# Patient Record
Sex: Male | Born: 1940 | Race: White | Hispanic: No | Marital: Married | Smoking: Never smoker
Health system: Southern US, Community
[De-identification: ages and names within clinical notes are randomized; demographics above are authoritative.]

## PROBLEM LIST (undated history)

## (undated) DIAGNOSIS — I10 Essential (primary) hypertension: Secondary | ICD-10-CM

## (undated) DIAGNOSIS — C3490 Malignant neoplasm of unspecified part of unspecified bronchus or lung: Secondary | ICD-10-CM

## (undated) DIAGNOSIS — G43909 Migraine, unspecified, not intractable, without status migrainosus: Secondary | ICD-10-CM

## (undated) DIAGNOSIS — Z860100 Personal history of colon polyps, unspecified: Secondary | ICD-10-CM

## (undated) DIAGNOSIS — E119 Type 2 diabetes mellitus without complications: Secondary | ICD-10-CM

## (undated) DIAGNOSIS — S065X9A Traumatic subdural hemorrhage with loss of consciousness of unspecified duration, initial encounter: Secondary | ICD-10-CM

## (undated) DIAGNOSIS — H409 Unspecified glaucoma: Secondary | ICD-10-CM

## (undated) DIAGNOSIS — E785 Hyperlipidemia, unspecified: Secondary | ICD-10-CM

## (undated) DIAGNOSIS — N200 Calculus of kidney: Secondary | ICD-10-CM

## (undated) DIAGNOSIS — M1990 Unspecified osteoarthritis, unspecified site: Secondary | ICD-10-CM

## (undated) DIAGNOSIS — C801 Malignant (primary) neoplasm, unspecified: Secondary | ICD-10-CM

## (undated) DIAGNOSIS — Z87442 Personal history of urinary calculi: Secondary | ICD-10-CM

## (undated) DIAGNOSIS — B029 Zoster without complications: Secondary | ICD-10-CM

## (undated) HISTORY — DX: Personal history of colon polyps, unspecified: Z86.0100

## (undated) HISTORY — DX: Essential (primary) hypertension: I10

## (undated) HISTORY — DX: Unspecified glaucoma: H40.9

## (undated) HISTORY — DX: Type 2 diabetes mellitus without complications: E11.9

## (undated) HISTORY — DX: Migraine, unspecified, not intractable, without status migrainosus: G43.909

## (undated) HISTORY — DX: Zoster without complications: B02.9

## (undated) HISTORY — DX: Hyperlipidemia, unspecified: E78.5

## (undated) HISTORY — PX: OTHER SURGICAL HISTORY: SHX169

## (undated) HISTORY — DX: Calculus of kidney: N20.0

## (undated) HISTORY — DX: Personal history of colonic polyps: Z86.010

## (undated) HISTORY — DX: Traumatic subdural hemorrhage with loss of consciousness of unspecified duration, initial encounter: S06.5X9A

## (undated) HISTORY — PX: APPENDECTOMY: SHX54

---

## 2000-10-03 ENCOUNTER — Ambulatory Visit (HOSPITAL_COMMUNITY): Admission: RE | Admit: 2000-10-03 | Discharge: 2000-10-03 | Payer: Self-pay | Admitting: Gastroenterology

## 2004-05-09 ENCOUNTER — Ambulatory Visit: Payer: Self-pay | Admitting: Family Medicine

## 2004-08-22 ENCOUNTER — Ambulatory Visit: Payer: Self-pay | Admitting: Family Medicine

## 2005-02-15 ENCOUNTER — Ambulatory Visit: Payer: Self-pay | Admitting: Family Medicine

## 2006-03-12 ENCOUNTER — Ambulatory Visit: Payer: Self-pay | Admitting: Family Medicine

## 2006-04-30 ENCOUNTER — Ambulatory Visit: Payer: Self-pay | Admitting: Family Medicine

## 2007-04-13 ENCOUNTER — Telehealth: Payer: Self-pay | Admitting: Family Medicine

## 2007-04-15 DIAGNOSIS — Z860100 Personal history of colon polyps, unspecified: Secondary | ICD-10-CM | POA: Insufficient documentation

## 2007-04-15 DIAGNOSIS — Z87442 Personal history of urinary calculi: Secondary | ICD-10-CM | POA: Insufficient documentation

## 2007-04-15 DIAGNOSIS — I10 Essential (primary) hypertension: Secondary | ICD-10-CM | POA: Insufficient documentation

## 2007-04-20 ENCOUNTER — Ambulatory Visit: Payer: Self-pay | Admitting: Family Medicine

## 2007-04-20 DIAGNOSIS — E785 Hyperlipidemia, unspecified: Secondary | ICD-10-CM | POA: Insufficient documentation

## 2007-04-22 LAB — CONVERTED CEMR LAB
Albumin: 4.4 g/dL (ref 3.5–5.2)
Alkaline Phosphatase: 89 units/L (ref 39–117)
BUN: 13 mg/dL (ref 6–23)
Basophils Absolute: 0 10*3/uL (ref 0.0–0.1)
Cholesterol: 156 mg/dL (ref 0–200)
Creatinine, Ser: 0.9 mg/dL (ref 0.4–1.5)
Eosinophils Absolute: 0.2 10*3/uL (ref 0.0–0.6)
GFR calc Af Amer: 109 mL/min
GFR calc non Af Amer: 90 mL/min
HDL: 49.1 mg/dL (ref >39.0)
Hemoglobin: 16.2 g/dL (ref 13.0–17.0)
LDL Cholesterol: 96 mg/dL (ref 0–99)
MCHC: 35.2 g/dL (ref 30.0–36.0)
Monocytes Absolute: 0.6 10*3/uL (ref 0.2–0.7)
Monocytes Relative: 6.4 % (ref 3.0–11.0)
Neutro Abs: 6 10*3/uL (ref 1.4–7.7)
Potassium: 4.7 meq/L (ref 3.5–5.1)
RDW: 12.7 % (ref 11.5–14.6)
TSH: 1.3 microintl units/mL (ref 0.35–5.50)
Vit D, 1,25-Dihydroxy: 30 (ref 30–89)

## 2007-06-22 ENCOUNTER — Ambulatory Visit: Payer: Self-pay | Admitting: Family Medicine

## 2007-06-22 ENCOUNTER — Telehealth: Payer: Self-pay | Admitting: Family Medicine

## 2007-06-22 DIAGNOSIS — E559 Vitamin D deficiency, unspecified: Secondary | ICD-10-CM | POA: Insufficient documentation

## 2007-06-22 DIAGNOSIS — E538 Deficiency of other specified B group vitamins: Secondary | ICD-10-CM | POA: Insufficient documentation

## 2007-06-26 LAB — CONVERTED CEMR LAB: Vit D, 1,25-Dihydroxy: 44 (ref 30–89)

## 2008-03-18 ENCOUNTER — Ambulatory Visit: Payer: Self-pay | Admitting: Family Medicine

## 2008-05-09 ENCOUNTER — Ambulatory Visit: Payer: Self-pay | Admitting: Family Medicine

## 2008-05-09 DIAGNOSIS — N138 Other obstructive and reflux uropathy: Secondary | ICD-10-CM | POA: Insufficient documentation

## 2008-05-09 DIAGNOSIS — N401 Enlarged prostate with lower urinary tract symptoms: Secondary | ICD-10-CM

## 2008-05-09 LAB — CONVERTED CEMR LAB
Bilirubin Urine: NEGATIVE
Glucose, Urine, Semiquant: NEGATIVE
WBC Urine, dipstick: NEGATIVE
pH: 7

## 2008-05-10 LAB — CONVERTED CEMR LAB
ALT: 21 units/L (ref 0–53)
AST: 22 units/L (ref 0–37)
Albumin: 4.2 g/dL (ref 3.5–5.2)
BUN: 15 mg/dL (ref 6–23)
Basophils Absolute: 0 10*3/uL (ref 0.0–0.1)
Basophils Relative: 0.3 % (ref 0.0–3.0)
CO2: 31 meq/L (ref 19–32)
Calcium: 9.6 mg/dL (ref 8.4–10.5)
Chloride: 106 meq/L (ref 96–112)
Cholesterol: 178 mg/dL (ref 0–200)
Creatinine, Ser: 0.9 mg/dL (ref 0.4–1.5)
Eosinophils Absolute: 0.2 10*3/uL (ref 0.0–0.7)
Eosinophils Relative: 2.1 % (ref 0.0–5.0)
Hemoglobin: 16.3 g/dL (ref 13.0–17.0)
Lymphocytes Relative: 26.5 % (ref 12.0–46.0)
MCHC: 34.2 g/dL (ref 30.0–36.0)
MCV: 91.9 fL (ref 78.0–100.0)
Neutro Abs: 5.5 10*3/uL (ref 1.4–7.7)
PSA: 0.96 ng/mL (ref 0.10–4.00)
RBC: 5.19 M/uL (ref 4.22–5.81)
TSH: 1.35 microintl units/mL (ref 0.35–5.50)
Total Bilirubin: 1 mg/dL (ref 0.3–1.2)
Total Protein: 7 g/dL (ref 6.0–8.3)
WBC: 8.5 10*3/uL (ref 4.5–10.5)

## 2008-10-25 ENCOUNTER — Encounter (INDEPENDENT_AMBULATORY_CARE_PROVIDER_SITE_OTHER): Payer: Self-pay | Admitting: *Deleted

## 2008-11-03 HISTORY — PX: OTHER SURGICAL HISTORY: SHX169

## 2008-11-13 ENCOUNTER — Inpatient Hospital Stay (HOSPITAL_COMMUNITY): Admission: EM | Admit: 2008-11-13 | Discharge: 2008-11-17 | Payer: Self-pay | Admitting: Emergency Medicine

## 2008-11-13 DIAGNOSIS — S065XAA Traumatic subdural hemorrhage with loss of consciousness status unknown, initial encounter: Secondary | ICD-10-CM

## 2008-11-13 DIAGNOSIS — S065X9A Traumatic subdural hemorrhage with loss of consciousness of unspecified duration, initial encounter: Secondary | ICD-10-CM

## 2008-11-13 HISTORY — DX: Traumatic subdural hemorrhage with loss of consciousness of unspecified duration, initial encounter: S06.5X9A

## 2008-11-13 HISTORY — DX: Traumatic subdural hemorrhage with loss of consciousness status unknown, initial encounter: S06.5XAA

## 2008-11-14 ENCOUNTER — Encounter (INDEPENDENT_AMBULATORY_CARE_PROVIDER_SITE_OTHER): Payer: Self-pay | Admitting: *Deleted

## 2008-11-17 ENCOUNTER — Encounter (INDEPENDENT_AMBULATORY_CARE_PROVIDER_SITE_OTHER): Payer: Self-pay | Admitting: *Deleted

## 2008-11-19 ENCOUNTER — Inpatient Hospital Stay (HOSPITAL_COMMUNITY): Admission: EM | Admit: 2008-11-19 | Discharge: 2008-11-23 | Payer: Self-pay | Admitting: Emergency Medicine

## 2008-11-21 ENCOUNTER — Encounter (INDEPENDENT_AMBULATORY_CARE_PROVIDER_SITE_OTHER): Payer: Self-pay | Admitting: Neurology

## 2008-11-21 ENCOUNTER — Encounter (INDEPENDENT_AMBULATORY_CARE_PROVIDER_SITE_OTHER): Payer: Self-pay | Admitting: Neurological Surgery

## 2008-11-23 ENCOUNTER — Encounter (INDEPENDENT_AMBULATORY_CARE_PROVIDER_SITE_OTHER): Payer: Self-pay | Admitting: *Deleted

## 2008-11-29 ENCOUNTER — Encounter: Admission: RE | Admit: 2008-11-29 | Discharge: 2008-11-29 | Payer: Self-pay | Admitting: Neurological Surgery

## 2008-12-02 ENCOUNTER — Encounter: Admission: RE | Admit: 2008-12-02 | Discharge: 2008-12-02 | Payer: Self-pay | Admitting: Anesthesiology

## 2008-12-02 ENCOUNTER — Encounter (INDEPENDENT_AMBULATORY_CARE_PROVIDER_SITE_OTHER): Payer: Self-pay | Admitting: *Deleted

## 2008-12-15 ENCOUNTER — Encounter: Admission: RE | Admit: 2008-12-15 | Discharge: 2009-03-14 | Payer: Self-pay | Admitting: Neurological Surgery

## 2009-01-03 ENCOUNTER — Encounter: Admission: RE | Admit: 2009-01-03 | Discharge: 2009-01-03 | Payer: Self-pay | Admitting: Neurological Surgery

## 2009-02-13 ENCOUNTER — Encounter: Admission: RE | Admit: 2009-02-13 | Discharge: 2009-02-13 | Payer: Self-pay | Admitting: Neurological Surgery

## 2009-04-17 ENCOUNTER — Encounter: Admission: RE | Admit: 2009-04-17 | Discharge: 2009-04-17 | Payer: Self-pay | Admitting: Anesthesiology

## 2009-05-01 ENCOUNTER — Ambulatory Visit: Payer: Self-pay | Admitting: Family Medicine

## 2009-05-01 DIAGNOSIS — B029 Zoster without complications: Secondary | ICD-10-CM | POA: Insufficient documentation

## 2009-05-30 ENCOUNTER — Ambulatory Visit: Payer: Self-pay | Admitting: Family Medicine

## 2009-06-08 ENCOUNTER — Telehealth: Payer: Self-pay | Admitting: Family Medicine

## 2009-06-08 LAB — CONVERTED CEMR LAB
Albumin: 4.2 g/dL (ref 3.5–5.2)
Alkaline Phosphatase: 90 units/L (ref 39–117)
Basophils Absolute: 0 10*3/uL (ref 0.0–0.1)
Basophils Relative: 0.2 % (ref 0.0–3.0)
CO2: 28 meq/L (ref 19–32)
Chloride: 107 meq/L (ref 96–112)
Eosinophils Absolute: 0.1 10*3/uL (ref 0.0–0.7)
Glucose, Bld: 145 mg/dL — ABNORMAL HIGH (ref 70–99)
HCT: 45.1 % (ref 39.0–52.0)
HDL: 57.9 mg/dL (ref >39.00)
Hemoglobin: 15.3 g/dL (ref 13.0–17.0)
Lymphocytes Relative: 31.1 % (ref 12.0–46.0)
Lymphs Abs: 1.9 10*3/uL (ref 0.7–4.0)
MCHC: 34 g/dL (ref 30.0–36.0)
MCV: 91.9 fL (ref 78.0–100.0)
Neutro Abs: 3.6 10*3/uL (ref 1.4–7.7)
PSA: 1.59 ng/mL (ref 0.10–4.00)
Potassium: 3.7 meq/L (ref 3.5–5.1)
RBC: 4.91 M/uL (ref 4.22–5.81)
RDW: 13.6 % (ref 11.5–14.6)
Sodium: 142 meq/L (ref 135–145)
TSH: 1.38 microintl units/mL (ref 0.35–5.50)
Total CHOL/HDL Ratio: 3
Total Protein: 6.9 g/dL (ref 6.0–8.3)

## 2009-11-30 ENCOUNTER — Ambulatory Visit: Payer: Self-pay | Admitting: Family Medicine

## 2009-12-01 LAB — CONVERTED CEMR LAB: Blood Glucose, AC Bkfst: 138 mg/dL

## 2009-12-07 ENCOUNTER — Ambulatory Visit: Payer: Self-pay | Admitting: Family Medicine

## 2009-12-11 LAB — CONVERTED CEMR LAB
AST: 20 units/L (ref 0–37)
Bilirubin, Direct: 0.2 mg/dL (ref 0.0–0.3)
HDL: 52.5 mg/dL (ref >39.00)
Hgb A1c MFr Bld: 6.1 % (ref 4.6–6.5)
Microalb Creat Ratio: 0.4 mg/g (ref 0.0–30.0)
Microalb, Ur: 0.6 mg/dL (ref 0.0–1.9)
Total Bilirubin: 1 mg/dL (ref 0.3–1.2)
Total CHOL/HDL Ratio: 3
VLDL: 13 mg/dL (ref 0.0–40.0)

## 2009-12-25 ENCOUNTER — Telehealth: Payer: Self-pay | Admitting: Family Medicine

## 2010-03-09 ENCOUNTER — Ambulatory Visit: Payer: Self-pay | Admitting: Family Medicine

## 2010-04-17 DIAGNOSIS — B029 Zoster without complications: Secondary | ICD-10-CM

## 2010-04-17 HISTORY — DX: Zoster without complications: B02.9

## 2010-05-31 ENCOUNTER — Ambulatory Visit: Payer: Self-pay | Admitting: Family Medicine

## 2010-05-31 DIAGNOSIS — F4321 Adjustment disorder with depressed mood: Secondary | ICD-10-CM | POA: Insufficient documentation

## 2010-06-04 ENCOUNTER — Ambulatory Visit: Payer: Self-pay | Admitting: Family Medicine

## 2010-06-27 ENCOUNTER — Telehealth: Payer: Self-pay | Admitting: Family Medicine

## 2010-07-17 NOTE — Progress Notes (Signed)
Summary: Pt has concerns re: dosage of Simvastatin re: FDA  Phone Note Call from Patient Call back at Home Phone [phone]   Caller: Patient Summary of Call: Pt called and said that the FDA said that pts can not take more than 20mg  of Simvastatin, if pt also taking Amlodipine. Pt says [facility], sent a fax to Dr. Clent Ridges re: this matter and have not gotten response. Pt is wondering what he should do. Please call asap.  Initial call taken by: Lucy Antigua,  December 25, 2009 9:49 AM  Follow-up for Phone Call        I agree that no one should be taking 80 mg of Simvastatin. Stop this and switch to Lipitor 40 mg once daily . Call in one year supply  Follow-up by: [name] MD,  December 25, 2009 2:46 PM  Additional Follow-up for Phone Call Additional follow up Details #1::        Phone Call Completed, Rx Called In Additional Follow-up by: Raechel Ache, RN,  December 25, 2009 2:48 PM    New/Updated Medications: LIPITOR 40 MG TABS (ATORVASTATIN CALCIUM) 1 once daily Prescriptions: LIPITOR 40 MG TABS (ATORVASTATIN CALCIUM) 1 once daily  #30 x 11   Entered by:   Raechel Ache, RN   Authorized by:   [name] MD   Signed by:   Raechel Ache, RN on 12/25/2009   Method used:   Electronically to        [id number] ([facility]       [address]       [place], [place]  [address]       Ph: [phone] or [phone]       Fax: [phone]   RxID:   [id number]

## 2010-07-17 NOTE — Assessment & Plan Note (Signed)
Summary: follow up on blood sugar/cjr   Vital Signs:  Patient profile:   70 year old male Weight:      175 pounds BP sitting:   124 / 68  (left arm) Cuff size:   regular  Vitals Entered By: Raechel Ache, RN (December 07, 2009 8:27 AM) CC: ROV to recheck labs.   History of Present Illness: Here to follow up on DM type 2 which was diagnosed last December as a part of his cpx. he has been watching his diet closely. he feels fine.   Allergies: No Known Drug Allergies  Past History:  Past Medical History: Migraines Colonic polyps, hx of Hypertension Nephrolithiasis, hx of Hyperlipidemia sees Dr. 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 Ewing Schlein, repeat in 5 yrs left craniotomy 11-13-08 per Dr. Marikay Alar for a subdural hematoma  Review of Systems  The patient denies anorexia, fever, weight loss, weight gain, vision loss, decreased hearing, hoarseness, chest pain, syncope, dyspnea on exertion, peripheral edema, prolonged cough, headaches, hemoptysis, abdominal pain, melena, hematochezia, severe indigestion/heartburn, hematuria, incontinence, genital sores, muscle weakness, suspicious skin lesions, transient blindness, difficulty walking, depression, unusual weight change, abnormal bleeding, enlarged lymph nodes, angioedema, breast masses, and testicular masses.    Physical Exam  General:  Well-developed,well-nourished,in no acute distress; alert,appropriate and cooperative throughout examination Neck:  No deformities, masses, or tenderness noted. Lungs:  Normal respiratory effort, chest expands symmetrically. Lungs are clear to auscultation, no crackles or wheezes. Heart:  Normal rate and regular rhythm. S1 and S2 normal without gallop, murmur, click, rub or other  extra sounds.   Impression & Recommendations:  Problem # 1:  DIABETES MELLITUS, TYPE II (ICD-250.00)  His updated medication list for this problem includes:    Ramipril 10 Mg Caps (Ramipril) .Marland Kitchen... 1 by mouth once daily    Aspirin 325 Mg Tabs (Aspirin) .Marland Kitchen... Take 1 tab by mouth every day  Orders: Venipuncture ([address]) TLB-Lipid Panel (80061-LIPID) TLB-Hepatic/Liver Function Pnl (80076-HEPATIC) TLB-A1C / Hgb A1C (Glycohemoglobin) (83036-A1C) TLB-Microalbumin/Creat Ratio, Urine (82043-MALB)  Problem # 2:  HYPERLIPIDEMIA (ICD-272.4)  His updated medication list for this problem includes:    Simvastatin 80 Mg Tabs (Simvastatin) .Marland Kitchen... 1 by mouth once daily  Problem # 3:  HYPERTENSION (ICD-401.9)  His updated medication list for this problem includes:    Ramipril 10 Mg Caps (Ramipril) .Marland Kitchen... 1 by mouth once daily    Amlodipine Besylate 10 Mg Tabs (Amlodipine besylate) .Marland Kitchen... 1 by mouth once daily  Complete Medication List: 1)  Simvastatin 80 Mg Tabs (Simvastatin) .Marland Kitchen.. 1 by mouth once daily 2)  Ramipril 10 Mg Caps (Ramipril) .Marland Kitchen.. 1 by mouth once daily 3)  Amlodipine Besylate 10 Mg Tabs (Amlodipine besylate) .Marland Kitchen.. 1 by mouth once daily 4)  Aspirin 325 Mg Tabs (Aspirin) .... Take 1 tab by mouth every day 5)  Fish Oil 300 Mg Caps (Omega-3 fatty acids) .... Once daily 6)  Multivitamins Tabs (Multiple vitamin) .Marland Kitchen.. 1 by mouth once daily  Patient Instructions: 1)  Get labs today

## 2010-07-17 NOTE — Assessment & Plan Note (Signed)
Summary: FLU SHOT // RS  Nurse Visit   Vitals Entered By: Duard Brady LPN (March 09, 2010 10:30 AM)  Allergies: No Known Drug Allergies  Immunizations Administered:  Influenza Vaccine # 1:    Vaccine Type: Fluvax MCR    Site: left deltoid    Mfr: GlaxoSmithKline    Dose: 0.5 ml    Route: IM    Given by: Duard Brady LPN    Exp. Date: 12/15/2010    Lot #: [id number]    VIS given: 01/09/10 version given March 09, 2010.    Physician counseled: yes  Flu Vaccine Consent Questions:    Do you have a history of severe allergic reactions to this vaccine? no    Any prior history of allergic reactions to egg and/or gelatin? no    Do you have a sensitivity to the preservative Thimersol? no    Do you have a past history of Guillan-Barre Syndrome? no    Do you currently have an acute febrile illness? no    Have you ever had a severe reaction to latex? no    Vaccine information given and explained to patient? no  Orders Added: 1)  Influenza Vaccine MCR [00025]

## 2010-07-19 NOTE — Assessment & Plan Note (Signed)
Summary: HIGH BLOOD PRESSURE/CJR   Vital Signs:  Patient profile:   70 year old male Weight:      181 pounds Temp:     98.2 degrees F oral Pulse rate:   72 / minute BP sitting:   134 / 74  Vitals Entered By: Lynann Beaver CMA AAMA (May 31, 2010 9:33 AM) CC: BP check Is Patient Diabetic? No Pain Assessment Patient in pain? no        History of Present Illness: Here with concerns about his BP. It has been stable as recently as 2 weeks ago, but over the past week it has been up in the 140s and 150s systolic. His diastolic has been in the 80s. He has been under stress with the poor health of his mother, who was living in [place]. She actually passed away this morning, and this has been hard on him.   Current Medications (verified): 1)  Ramipril 10 Mg  Caps (Ramipril) .Marland Kitchen.. 1 By Mouth Once Daily 2)  Amlodipine Besylate 10 Mg  Tabs (Amlodipine Besylate) .Marland Kitchen.. 1 By Mouth Once Daily 3)  Aspirin 325 Mg Tabs (Aspirin) .... Take 1 Tab By Mouth Every Day 4)  Fish Oil 300 Mg  Caps (Omega-3 Fatty Acids) .... Once Daily 5)  Multivitamins   Tabs (Multiple Vitamin) .Marland Kitchen.. 1 By Mouth Once Daily 6)  Lipitor 40 Mg Tabs (Atorvastatin Calcium) .Marland Kitchen.. 1 Once Daily  Allergies (verified): No Known Drug Allergies  Past History:  Past Medical History: Reviewed history from 12/07/2009 and no changes required. Migraines Colonic polyps, hx of Hypertension Nephrolithiasis, hx of Hyperlipidemia sees Dr. 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    Physical Exam  General:  Well-developed,well-nourished,in no acute distress; alert,appropriate and cooperative throughout examination Neck:  No deformities, masses, or tenderness noted. Lungs:  Normal respiratory effort, chest expands symmetrically. Lungs are clear to auscultation, no crackles or wheezes. Heart:  Normal rate and regular rhythm. S1 and S2 normal without gallop, murmur, click, rub or other extra sounds. Psych:  Oriented X3, memory intact for recent and remote, normally interactive, good eye contact, and tearful.     Impression & Recommendations:  Problem # 1:  GRIEF REACTION (ICD-309.0)  Problem # 2:  HYPERLIPIDEMIA (ICD-272.4)  His updated medication list for this problem includes:    Lipitor 40 Mg Tabs (Atorvastatin calcium) .Marland Kitchen... 1 once daily  Problem # 3:  HYPERTENSION (ICD-401.9)  His updated medication list for this problem includes:    Ramipril 10 Mg Caps (Ramipril) .Marland Kitchen... 1 by mouth once daily    Amlodipine Besylate 10 Mg Tabs (Amlodipine besylate) .Marland Kitchen... 1 by mouth once daily  Complete Medication List: 1)  Ramipril 10 Mg Caps (Ramipril) .Marland Kitchen.. 1 by mouth once daily 2)  Amlodipine Besylate 10 Mg Tabs (Amlodipine besylate) .Marland Kitchen.. 1 by mouth once daily 3)  Aspirin 325 Mg Tabs (Aspirin) .... Take 1 tab by mouth every day 4)  Fish Oil 300 Mg Caps (Omega-3 fatty acids) .... Once daily 5)  Multivitamins Tabs (Multiple vitamin) .Marland KitchenMarland KitchenMarland Kitchen  1 by mouth once daily 6)  Lipitor 40 Mg Tabs (Atorvastatin calcium) .Marland Kitchen.. 1 once daily 7)  Lorazepam 1 Mg Tabs (Lorazepam) .... Three times a day as needed for anxiety  Patient Instructions: 1)  I think his HTN is actually under excellent control, but his BP is up temporarily due to stress. Will refill his current meds. Use Lorazepam as needed as he  goes through the grieving process. 2)  Please schedule a follow-up appointment as needed .  Prescriptions: LORAZEPAM 1 MG TABS (LORAZEPAM) three times a day as needed for anxiety  #60 x 0   Entered and Authorized by:   [name] MD   Signed by:   [name] MD on 05/31/2010   Method used:   Print then Give to Patient   RxID:   [id number] LIPITOR 40 MG TABS (ATORVASTATIN CALCIUM) 1 once daily  #30 x 11   Entered and Authorized by:   [name] MD   Signed by:   [name] MD on 05/31/2010   Method used:   Print then Give to Patient   RxID:   [id number] RAMIPRIL 10 MG  CAPS (RAMIPRIL) 1 by mouth once daily  #30 x 11   Entered and Authorized by:   [name] MD   Signed by:   [name] MD on 05/31/2010   Method used:   Print then Give to Patient   RxID:   [id number]    Orders Added: 1)  Est. Patient Level IV [42595]

## 2010-07-19 NOTE — Progress Notes (Signed)
Summary: med swtich  Phone Note Call from Patient Call back at Home Phone [phone]   Caller: Patient Call For: [name] MD Summary of Call: Pt would like to change to Simvastatin 40 mg. instead of Vytorin due to insurance coverage. [facility])  Initial call taken by: Lynann Beaver CMA AAMA,  June 27, 2010 3:27 PM  Follow-up for Phone Call        he is not on Vytorin, he is on Lipitor. This is now generic,so it should be covered by insurance. I do not want to go back on low dose Simvastatin because it is not strong enough Follow-up by: [name] MD,  June 28, 2010 1:17 PM  Additional Follow-up for Phone Call Additional follow up Details #1::        Pt notifed. Additional Follow-up by: Lynann Beaver CMA AAMA,  June 28, 2010 3:49 PM

## 2010-09-24 LAB — CBC
HCT: 37.3 % — ABNORMAL LOW (ref 39.0–52.0)
MCHC: 34.4 g/dL (ref 30.0–36.0)
MCV: 90.8 fL (ref 78.0–100.0)
MCV: 90.8 fL (ref 78.0–100.0)
Platelets: 307 10*3/uL (ref 150–400)
RBC: 4.11 MIL/uL — ABNORMAL LOW (ref 4.22–5.81)
WBC: 13.4 10*3/uL — ABNORMAL HIGH (ref 4.0–10.5)
WBC: 6.4 10*3/uL (ref 4.0–10.5)

## 2010-09-24 LAB — BASIC METABOLIC PANEL
Calcium: 9.1 mg/dL (ref 8.4–10.5)
Creatinine, Ser: 0.77 mg/dL (ref 0.4–1.5)
GFR calc Af Amer: >60 mL/min (ref >60)
Sodium: 139 mEq/L (ref 135–145)

## 2010-09-24 LAB — URINALYSIS, MICROSCOPIC ONLY
Glucose, UA: NEGATIVE mg/dL
Ketones, ur: NEGATIVE mg/dL
Leukocytes, UA: NEGATIVE
Nitrite: NEGATIVE
Specific Gravity, Urine: 1.018 (ref 1.005–1.030)
pH: 6 (ref 5.0–8.0)

## 2010-09-24 LAB — COMPREHENSIVE METABOLIC PANEL
ALT: 35 U/L (ref 0–53)
AST: 29 U/L (ref 0–37)
Albumin: 3.2 g/dL — ABNORMAL LOW (ref 3.5–5.2)
Calcium: 9.2 mg/dL (ref 8.4–10.5)
Chloride: 102 mEq/L (ref 96–112)
Creatinine, Ser: 0.89 mg/dL (ref 0.4–1.5)
GFR calc Af Amer: >60 mL/min (ref >60)
Sodium: 140 mEq/L (ref 135–145)
Total Bilirubin: 0.4 mg/dL (ref 0.3–1.2)

## 2010-09-24 LAB — DIFFERENTIAL
Basophils Relative: 0 % (ref 0–1)
Eosinophils Absolute: 0.1 10*3/uL (ref 0.0–0.7)
Eosinophils Relative: 0 % (ref 0–5)
Eosinophils Relative: 1 % (ref 0–5)
Lymphocytes Relative: 7 % — ABNORMAL LOW (ref 12–46)
Lymphs Abs: 1 10*3/uL (ref 0.7–4.0)
Lymphs Abs: 1.2 10*3/uL (ref 0.7–4.0)
Monocytes Absolute: 0.4 10*3/uL (ref 0.1–1.0)
Monocytes Absolute: 0.5 10*3/uL (ref 0.1–1.0)
Monocytes Relative: 7 % (ref 3–12)

## 2010-09-24 LAB — LIPID PANEL
HDL: 36 mg/dL — ABNORMAL LOW (ref >39)
Triglycerides: 90 mg/dL (ref <150)
VLDL: 18 mg/dL (ref 0–40)

## 2010-09-24 LAB — URINE CULTURE

## 2010-09-24 LAB — HEMOGLOBIN A1C: Hgb A1c MFr Bld: 5.9 % (ref 4.6–6.1)

## 2010-09-25 LAB — COMPREHENSIVE METABOLIC PANEL
ALT: 20 U/L (ref 0–53)
AST: 29 U/L (ref 0–37)
CO2: 24 mEq/L (ref 19–32)
Calcium: 9.1 mg/dL (ref 8.4–10.5)
GFR calc Af Amer: >60 mL/min (ref >60)
GFR calc non Af Amer: >60 mL/min (ref >60)
Sodium: 140 mEq/L (ref 135–145)
Total Protein: 7.3 g/dL (ref 6.0–8.3)

## 2010-09-25 LAB — RAPID URINE DRUG SCREEN, HOSP PERFORMED
Amphetamines: NOT DETECTED
Barbiturates: NOT DETECTED
Benzodiazepines: NOT DETECTED
Opiates: NOT DETECTED

## 2010-09-25 LAB — CBC
MCHC: 34.6 g/dL (ref 30.0–36.0)
RBC: 4.84 MIL/uL (ref 4.22–5.81)

## 2010-09-25 LAB — DIFFERENTIAL
Eosinophils Absolute: 0.1 10*3/uL (ref 0.0–0.7)
Eosinophils Relative: 1 % (ref 0–5)
Lymphs Abs: 1.5 10*3/uL (ref 0.7–4.0)
Monocytes Absolute: 1 10*3/uL (ref 0.1–1.0)
Monocytes Relative: 7 % (ref 3–12)

## 2010-09-25 LAB — URINE CULTURE

## 2010-09-25 LAB — POCT CARDIAC MARKERS
CKMB, poc: 1.9 ng/mL (ref 1.0–8.0)
Myoglobin, poc: 81.6 ng/mL (ref 12–200)
Troponin i, poc: <0.05 ng/mL (ref 0.00–0.09)

## 2010-09-25 LAB — URINALYSIS, ROUTINE W REFLEX MICROSCOPIC
Glucose, UA: NEGATIVE mg/dL
Hgb urine dipstick: NEGATIVE
Specific Gravity, Urine: 1.016 (ref 1.005–1.030)
pH: 6 (ref 5.0–8.0)

## 2010-09-25 LAB — ACETAMINOPHEN LEVEL: Acetaminophen (Tylenol), Serum: <10 ug/mL — ABNORMAL LOW (ref 10–30)

## 2010-09-25 LAB — LACTIC ACID, PLASMA: Lactic Acid, Venous: 2.5 mmol/L — ABNORMAL HIGH (ref 0.5–2.2)

## 2010-10-30 NOTE — Op Note (Signed)
NAMEGUSTAVE, LINDEMAN NO.:  [id number]   MEDICAL RECORD NO.:  [id number]          PATIENT TYPE:  INP   LOCATION:  2550                         FACILITY:  [facility]   PHYSICIAN:  Tia Alert, MD     DATE OF BIRTH:  [birth year]   DATE OF PROCEDURE:  11/13/2008  DATE OF DISCHARGE:                               OPERATIVE REPORT   PREOPERATIVE DIAGNOSIS:  Left acute subdural hematoma.   POSTOPERATIVE DIAGNOSIS:  Left acute subdural hematoma.   PROCEDURE:  Left craniotomy for evacuation of subdural hematoma.   SURGEON:  Tia Alert, MD   ANESTHESIA:  General endotracheal.   COMPLICATIONS:  None apparent.   INDICATIONS FOR THE PROCEDURE:  Mr. Puertas is a 70 year old gentleman  who presented to the emergency department with an acute subdural  hematoma.  The etiology of this is unknown.  We got a CT angiogram  before the surgery to rule out a PCom aneurysm as the cause.  There is  no obvious history of trauma.  I recommended a left craniotomy for  evacuation of a large subdural hematoma.  The family understood the  risks, benefits, expected outcome, and wished to proceed.   DESCRIPTION OF THE PROCEDURE:  The patient was taken to the operating  room.  After induction of adequate generalized endotracheal anesthesia,  his head was fixed in a three-point Mayfield headrest and turned to the  right, exposing the left frontotemporoparietal region.  This was shaved,  cleaned with alcohol, and then prepped with DuraPrep and draped in the  usual sterile fashion.  A large question mark incision was made and the  skin flap and temporalis musculature was reflected forward to expose the  keyhole in the temporal region and the frontal and parietal region.  Two  bur holes were made, one in the inferior temporal region and one in the  keyhole and then a large craniotomy flap was turned.  The underlying  dura was tensed.  I opened this in a curvilinear fashion to expose the  large acute subdural hematoma.  This was removed with suction.  We  stripped under the flap to remove blood clot from under the flap.  There  was 1 small surface arterial bleeder in the same spot that we saw,  extravasation of dye on the preoperative CT angiogram.  This may have  been his original cause of the hemorrhage.  This was coagulated.  I then  spent considerable time irrigating until the irrigant was clear,  inspected for any more bleeding, did not find any, and therefore closed  the dura with running 4-0 Nurolon sutures.  I then irrigated with saline  solution containing bacitracin, lined the dura with Gelfoam, replaced  the bone flap with 3 cranial fix clamps and then closed the temporalis  fascia with 2-0 Vicryl, placed a medium Hemovac drain through a separate  stab incision, and then closed the galea with interrupted 2-0 Vicryl and  closed the skin with staples.  The drapes were removed.  The patient was taken out of three-point Mayfield headrest, awakened  from general anesthesia,  and transferred to recovery room in stable  condition.  At the end of the procedure, all sponge, needle, and  instrument counts were correct.      Tia Alert, MD  Electronically Signed     DSJ/MEDQ  D:  11/14/2008  T:  11/14/2008  Job:  [id number]

## 2010-10-30 NOTE — Discharge Summary (Signed)
NAMEFINLEE, MILO NO.:  [id number]   MEDICAL RECORD NO.:  [id number]          PATIENT TYPE:  INP   LOCATION:  3015                         FACILITY:  [facility]   PHYSICIAN:  Tia Alert, MD     DATE OF BIRTH:  1941-02-25   DATE OF ADMISSION:  11/19/2008  DATE OF DISCHARGE:  11/23/2008                               DISCHARGE SUMMARY   ADMISSION DIAGNOSIS:  Headache and cortical dysfunction with possible  seizure activity and possible stroke.   BRIEF HISTORY OF PRESENT ILLNESS:  Mr. Gandolfi is a 70 year old  gentleman, who was admitted last week with a left acute subdural  hematoma.  He underwent an emergent craniotomy for evacuation of  subdural hematoma.  He did extremely well postoperatively and had a  nonfocal neurologic exam and was discharged to home after 4-5 days with  plans to follow up in 1 week with me in the office.  He then presented  over this past weekend, on November 19, 2008, to the ER with severe  headaches.  He also had had some speech dysfunction and he was found to  have expressive dysphasia or a aphasia.  He was admitted by my partner,  Dr. Wynetta Emery.  He was put on steroids.  He had Neurology consultation, and  they reloaded him with Keppra, which he was taking at home.  He was then  admitted to the neurosurgical ICU.  CT scan suggested a diffuse left  hemispheric edema with slight increase in midline shift but no new  subdural hemorrhage or other complications seen.  There was question of  a small basal ganglia hypodensity suggestive of infarct, which looking  back at his previous CT scans may have been there prior to his previous  discharge, and therefore, the stroke service was also consulted.   HOSPITAL COURSE:  The patient's hospital course was fairly routine.  There were no complications.  It seemed in the afternoons, he had more  trouble with expressive aphasia.  He had multiple CT scans including a  CT scan with contrast, which were  stable.  Again, there was a left basal  ganglia hypodensity consistent with an old infarct.  The stroke service  was remained unconvinced that this was an infarct.  He appeared to be  asymptomatic from this infarct as he had no right-sided numbness or  weakness.  He remained awake and alert and interactive but again had  waxing and waning speech.  It was difficult to know if this was seizure  activity or simple cortical dysfunction from the previous surgery and  trauma.  We suspected it was likely just cortical dysfunction and  neuronal dysfunction secondary to that, not necessarily seizure  activity, but he remained on Keppra.  We considered EEG, but this was  not recommended by the stroke service or the neurologist.  The  neurologists did not recommend CT angiogram or formal four-vessel  cerebral arteriogram to evaluate the cause of the stroke.  He did have a  cardiac echo and carotid Dopplers, which were fine.  Therefore, the  stroke service felt that since  he was doing so well from a symptomatic  standpoint that he could be treated simply with aspirin.  We did not  suspect venous thrombosis or vasculopathy or vasculitis as the cause of  the potential small infarct or the cortical dysfunction, but these  things were also considered and discussed at length.  The other thing I  considered was infection of the surgical site although he did not appear  to have any empyema.  We did do a sed rate that was 28, that essentially  ruled out subdural empyema.  We also did a CT scan with contrast that  did not suggest an infection at the surgical site.  He continued do well  neurologically.  His Keppra was increased to 750 mg p.o. b.i.d.  He  continued on his aspirin.  He remained nonfocal on his neurologic exam  except for occasional difficulty with speech which is not an uncommon  finding after a craniotomy for evacuation of a subdural hematoma over  the dominant hemisphere.  As a matter of fact,  the family was warned  that this may occur and then often occurs in older the patient after  left-sided craniotomy for subdural hematoma prior to his previous  discharge to home.  Therefore, by November 23, 2008, we felt he was stable  for discharge, the neurologist felt he was stable for discharge, and  felt he needed no further workup or treatment other than aspirin.  They  did recommend outpatient speech therapy consultation and this was  arranged prior to discharge, and he was discharged  home in stable condition on November 23, 2008, with plans to follow up in 6  days with me in the office with a repeat head CT.  There were to call if  he had any further concerns, headache, or neurologic deficit.   FINAL DIAGNOSIS:  Subdural hematoma.      Tia Alert, MD  Electronically Signed     DSJ/MEDQ  D:  11/23/2008  T:  11/24/2008  Job:  [id number]

## 2010-10-30 NOTE — H&P (Signed)
NAMEMASTER, TOUCHET NO.:  [id number]   MEDICAL RECORD NO.:  [id number]          PATIENT TYPE:  INP   LOCATION:  3109                         FACILITY:  [facility]   PHYSICIAN:  Donalee Citrin, M.D.        DATE OF BIRTH:  1941/04/02   DATE OF ADMISSION:  11/19/2008  DATE OF DISCHARGE:                              HISTORY & PHYSICAL   ADMITTING DIAGNOSIS:  Headaches, left hemispheric cerebrovascular  accident, status post craniotomy.   HISTORY OF PRESENT ILLNESS:  The patient is a 70 year old gentleman who  presented to the Neurosurgery Service last week with spontaneous acute  left subdural hematoma.  The patient was worked up initially with CT and  CTA that showed a cortical bleeder.  The patient was emergently taken to  the OR and underwent craniotomy for evacuation of an acute left subdural  hematoma.  Postoperatively, the patient did very well with covering in  the ICU and then the floor and the patient was ready to be discharged on  Friday.  However, his family covering this morning with worsening  headaches and some occasional word-finding or speech difficulty.  The  patient brought the emergency room, was evaluated.  Repeat head CT  showed increased edema and the patient is being readmitted for  evaluation and management of increased cerebral edema and evolving left  globus pallidus CVA.   PAST MEDICAL HISTORY:  Remarkable for hypertension,  hypercholesterolemia.   For further details of surgical and social history, please see  previously dictated H and P from his admission last week.   On exam, the patient is awake and alert.  He has some slow initiation of  speech; however, his word finding seems to be intact.  He has not intact  naming.  His pupils are equal and reactive.  Strength is 5/5 with very  slight right-sided pronator drift.  His incision looks clear with a  minimal amount of swelling under his craniotomy flap.  CT scan does show  increased  cerebral edema with about 3.5 mm midline shift, slightly  worsens from his immediate postoperative scan, but no significant  increase in acute hemorrhage under the defect.  It does show any  evolution of a left globus pallidus stroke or infarct that was enhanced  and previously seen on the first CT scan, but very subtle.  We will  admit the patient to the ICU, have consult to the Stroke Service.  We  will place the patient on steroids to help with his cerebral edema and  continuous stroke workup.           ______________________________  Donalee Citrin, M.D.     GC/MEDQ  D:  11/19/2008  T:  11/20/2008  Job:  [id number]
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
## 2010-10-30 NOTE — Consult Note (Signed)
Shaun Mcmillan, Shaun Mcmillan NO.:  [id number]   MEDICAL RECORD NO.:  [id number]          PATIENT TYPE:  INP   LOCATION:  3109                         FACILITY:  [facility]   PHYSICIAN:  Noel Christmas, MD    DATE OF BIRTH:  1940/12/27   DATE OF CONSULTATION:  11/19/2008  DATE OF DISCHARGE:                                 CONSULTATION   REASON FOR CONSULTATION:  Basal ganglia stroke per CT scan in the  patient who is postop 6 days for left subdural hematoma.   HISTORY OF PRESENT ILLNESS:  This is a 70 year old man who was brought  to the emergency room on Nov 13, 2008, with confusion.  The patient was  found to have large left subdural hematoma with mass effect with midline  shift.  The patient underwent craniotomy with evacuation of subdural  hematoma.  His postop course was unremarkable.  He was discharged on  November 17, 2008 with hydrocodone for pain and Keppra for seizure  prophylaxis.  The patient returned today with increasing severity of  headache, refractory to hydrocodone.  CT scan of his head on December 14, 2008 and again on June 31, 2010 showed small left basal ganglia lucency  in the globus pallidus region.  CT today shows enlargement of the small  area of lucency with more prominence compared to the previous studies.  There is no evidence of basal ganglia hemorrhage.  The patient has not  experienced right-sided weakness or speech changes.  He has also  experienced no different changes in his gait.  There has been no  numbness as well.   PAST MEDICAL HISTORY:  Remarkable for hypertension, hyperlipidemia, and  subdural hematoma with subsequent evacuation as described above.   CURRENT MEDICATIONS:  1. Zocor.  2. Altace.  3. Vicodin p.r.n.  4. Keppra.   FAMILY HISTORY:  Noncontributory.   PHYSICAL EXAMINATION:  GENERAL:  Appearance was that of slightly elderly  man who was alert and cooperative, in no acute distress.  He was well  oriented to time as well as  place.  Short-term and long-term memory were  normal.  Affect was appropriate.  He appeared to be in no acute  distress.  HEENT:  Pupils were equal and reactive normally to light.  Extraocular  movements and visual fields were normal.  There was no facial numbness,  no facial weakness.  Hearing was normal.  Speech and palatal movement  were normal.  EXTREMITIES:  He had no pronator drift of his upper extremities on  either side.  Muscle strength was normal as was muscle tone proximally  and distally throughout.  Deep tendon reflexes were normal and  symmetrical throughout.  Plantar responses were flexor.  Sensory  examination was normal.  Coordination was normal.  Carotid auscultation  was normal.   CLINICAL IMPRESSION:  1. 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  2. Significant risk factors for small vessel ischemic stroke including      hypertension and hyperlipidemia.  3. No clinical manifestations of acute stroke.  4. Left subdural hematoma with subsequent evacuation, with no clinical      deficit at this point.   RECOMMENDATIONS:  1. Antiplatelet therapy with aspirin when deemed safe per      Neurosurgery.  2. Carotid Doppler study.  3. Echocardiogram.  4. MRI and MRA of brain if not contraindicated by the type of hardware      that was used to mend his tibial fracture on the right 50 years      ago.   Thank you for asking me to evaluate [name].      Noel Christmas, MD  Electronically Signed     CS/MEDQ  D:  11/19/2008  T:  11/19/2008  Job:  [id number]

## 2010-10-30 NOTE — Discharge Summary (Signed)
Shaun Mcmillan, Shaun Mcmillan NO.:  [id number]   MEDICAL RECORD NO.:  [id number]          PATIENT TYPE:  INP   LOCATION:  3030                         FACILITY:  [facility]   PHYSICIAN:  Tia Alert, MD     DATE OF BIRTH:  [birth year]   DATE OF ADMISSION:  11/13/2008  DATE OF DISCHARGE:  11/17/2008                               DISCHARGE SUMMARY   ADMITTING DIAGNOSIS:  Left subdural hematoma.   BRIEF HISTORY OF PRESENT ILLNESS:  Shaun Mcmillan is a 70 year old  gentleman who was admitted on Nov 13, 2008 with a left acute subdural  hematoma.  This was of unknown etiology, but he had quite a bit of shift  associated with this and was a large 14 mm hematoma.  I recommended  emergent craniotomy for decompression.  We did do a CT angiogram prior  to this since there was no history of trauma to rule out a posterior  communicating artery aneurysm.  This was negative, therefore, he was  taken emergently to the operating room for evacuation of acute subdural  hematoma.   HOSPITAL COURSE:  The patient went to the operating room on Nov 13, 2008  for an emergent decompressive craniotomy with evacuation of left acute  subdural hematoma.  The patient tolerated the procedure well and was  taken to the recovery room and then to the neurosurgical ICU in stable  condition.  For details of the operative procedure, please see the  dictated operative note.  The patient's hospital course was routine.  There were no complications.  His postoperative CT scan looked quite  good.  He did amazingly well following the surgery.  He was awake and  alert.  He was interactive.  He had no pronator drift.  He had no  aphasia.  His incision remained clean, dry and intact.  He was advanced  to a regular diet which he tolerated well.  He remained in the ICU for 2  days and was transferred to the floor.  He did well here.  He had safety  evaluations by physical and occupational therapy which stated he needed  no more acute care from their standpoint.  He was awake and alert had a  nonfocal neurologic exam, had had no seizure activity.  He was  discharged home in stable condition on November 17, 2008 with plans to follow  up in 4-5 days with a repeat head CT.   DISCHARGE MEDICATIONS:  Vicodin and Keppra.  He understands the use of  these medications and potential side effects.  He is to continue his  home medications.   Again he has a followup visit in 4-5 days with a repeat head CT.   FINAL DIAGNOSIS:  Craniotomy for acute subdural hematoma.      Tia Alert, MD  Electronically Signed     DSJ/MEDQ  D:  11/17/2008  T:  11/18/2008  Job:  [id number]

## 2010-11-02 NOTE — Assessment & Plan Note (Signed)
[facility]                          BEHAVIORAL MEDICINE OFFICE NOTE   STEADMAN, PROSPERI                        MRN:          [id number]  DATE:03/12/2006                            DOB:          1941/01/23    This is a 70 year old gentleman here for a complete physical examination.  He feels fine and has no complaints.  We have been following him for  hypertension and hyperlipidemia.  He sees Dr. Ewing Schlein on a regular basis for a  history of colon polyps.  He had another colonoscopy earlier this year.  He  did have a few benign polyps removed and a 5 year followup was recommended.  He also continues to see Dr. Verdis Frederickson on a yearly basis for skin checks.  For  other details of his past medical history, family history, social history,  habits, Melvern Banker, refer to our introductory note with him dated February 24, 2004.   ALLERGIES:  None.   CURRENT MEDICATIONS:  1. Altace 10 mg per day.  2. Zocor 20 mg per day.  3. Norvasc 10 mg per day.  4. Aspirin 81 mg per day.   OBJECTIVE:  Height 5 feet 10 inches, weight 177, BP 108/70, pulse 64 and  regular.  GENERAL:  He appears to be quite healthy.  SKIN:  Shows a lot of sun damage, but no significant lesions.  EYES:  Clear.  He wears glasses.  EARS:  Clear.  PHARYNX:  Clear.  NECK:  Supple without lymphadenopathy, masses.  LUNGS:  Clear.  CARDIAC:  Rate and rhythm are regular without gallops, murmurs, or rubs.  Distal  pulses full.  EKG is within normal limits.  ABDOMEN:  Soft.  Normal bowel sounds.  Nontender.  No masses.  GENITALIA:  Normal male.  RECTAL:  No mass or tenderness.  Prostate is within normal limits.  Stool  Hemoccult negative.  EXTREMITIES:  No cyanosis, clubbing, or edema.  NEUROLOGIC:  Grossly intact.   ASSESSMENT AND PLAN:  Problem #1.  Complete physical.  Will check the usual  laboratories today.  Problem #2.  Hyperlipidemia.  Will check a fasting lipid panel.  Problem #3.   Hypertension.  Stable.            ______________________________  Tera Mater Clent Ridges, MD     SAF/MedQ  DD:  03/12/2006  DT:  03/13/2006  Job #:  [id number]

## 2010-11-02 NOTE — Procedures (Signed)
[facility]  Patient:    Shaun Mcmillan, Shaun Mcmillan                     MRN: [id number] Proc. Date: 10/03/00 Adm. Date:  [id number] Attending:  Nelda Marseille CC:         Desma Maxim, M.D.   Procedure Report  PROCEDURE:  Colonoscopy.  INDICATIONS FOR PROCEDURE:  A patient with a history of colon polyps due for repeat screening.  Consent was signed after risks, benefits, methods, and options were thoroughly discussed in the past.  MEDICINES USED:  Demerol 90, Versed 7.5.  DESCRIPTION OF PROCEDURE:  Rectal inspection was pertinent for external hemorrhoids. Digital exam was negative. The video colonoscope was inserted, easily advanced around the colon to the cecum. This did not require any abdominal pressure or any position changes. The cecum was identified by the appendiceal orifice and the ileocecal valve. In fact, the scope was inserted a short ways into the terminal ileum. No obvious abnormality was seen on insertion. The scope was slowly withdrawn. On slow withdrawal through the colon, no abnormalities were seen. The prep was adequate. There was stool that required washing and suctioning but on slow withdrawal through the colon again no polypoid lesions, masses or other abnormalities were seen. Once back in the rectum, the scope was retroflexed pertinent for some internal hemorrhoids. Anorectal pull-through confirmed the above. The scope was then reinserted a short ways up the left side of the colon, air was suctioned, the scope removed. The patient tolerated the procedure well. There was on obvious or immediate complication.  ENDOSCOPIC DIAGNOSIS: 1. Internal, external hemorrhoids. 2. Otherwise normal exam to the terminal ileum.  PLAN:  Yearly rectals and guaiacs per Dr. Arvilla Market. Happy to see back p.r.n. otherwise repeat screening in five years unless symptoms change or repeatedly guaiac positive, etc. DD:  10/03/00 TD:  10/03/00 Job:  [id number]

## 2010-12-07 ENCOUNTER — Encounter: Payer: Self-pay | Admitting: Family Medicine

## 2010-12-11 ENCOUNTER — Ambulatory Visit (INDEPENDENT_AMBULATORY_CARE_PROVIDER_SITE_OTHER): Payer: Medicare Other | Admitting: Family Medicine

## 2010-12-11 ENCOUNTER — Encounter: Payer: Self-pay | Admitting: Family Medicine

## 2010-12-11 VITALS — BP 132/78 | HR 67 | Temp 98.1°F | Resp 16 | Ht 71.0 in | Wt 180.0 lb

## 2010-12-11 DIAGNOSIS — I10 Essential (primary) hypertension: Secondary | ICD-10-CM

## 2010-12-11 DIAGNOSIS — E785 Hyperlipidemia, unspecified: Secondary | ICD-10-CM

## 2010-12-11 DIAGNOSIS — Z136 Encounter for screening for cardiovascular disorders: Secondary | ICD-10-CM

## 2010-12-11 DIAGNOSIS — N138 Other obstructive and reflux uropathy: Secondary | ICD-10-CM

## 2010-12-11 DIAGNOSIS — N139 Obstructive and reflux uropathy, unspecified: Secondary | ICD-10-CM

## 2010-12-11 DIAGNOSIS — E119 Type 2 diabetes mellitus without complications: Secondary | ICD-10-CM

## 2010-12-11 DIAGNOSIS — N401 Enlarged prostate with lower urinary tract symptoms: Secondary | ICD-10-CM

## 2010-12-11 DIAGNOSIS — Z125 Encounter for screening for malignant neoplasm of prostate: Secondary | ICD-10-CM

## 2010-12-11 LAB — CBC WITH DIFFERENTIAL/PLATELET
Basophils Relative: 0.3 % (ref 0.0–3.0)
Eosinophils Absolute: 0.2 10*3/uL (ref 0.0–0.7)
Eosinophils Relative: 1.9 % (ref 0.0–5.0)
Lymphocytes Relative: 22.2 % (ref 12.0–46.0)
Monocytes Relative: 6 % (ref 3.0–12.0)
Neutrophils Relative %: 69.6 % (ref 43.0–77.0)
RBC: 4.88 Mil/uL (ref 4.22–5.81)
WBC: 9.2 10*3/uL (ref 4.5–10.5)

## 2010-12-11 LAB — HEMOGLOBIN A1C: Hgb A1c MFr Bld: 7 % — ABNORMAL HIGH (ref 4.6–6.5)

## 2010-12-11 LAB — HEPATIC FUNCTION PANEL
Albumin: 4.8 g/dL (ref 3.5–5.2)
Total Bilirubin: 0.8 mg/dL (ref 0.3–1.2)

## 2010-12-11 LAB — LIPID PANEL
Cholesterol: 141 mg/dL (ref 0–200)
LDL Cholesterol: 75 mg/dL (ref 0–99)
Total CHOL/HDL Ratio: 3
VLDL: 11.4 mg/dL (ref 0.0–40.0)

## 2010-12-11 LAB — POCT URINALYSIS DIPSTICK
Bilirubin, UA: NEGATIVE
Glucose, UA: NEGATIVE
Ketones, UA: NEGATIVE
Leukocytes, UA: NEGATIVE
Nitrite, UA: NEGATIVE

## 2010-12-11 LAB — BASIC METABOLIC PANEL
BUN: 21 mg/dL (ref 6–23)
Creatinine, Ser: 0.9 mg/dL (ref 0.4–1.5)
GFR: 92.19 mL/min (ref >60.00)
Potassium: 4 mEq/L (ref 3.5–5.1)

## 2010-12-11 LAB — TSH: TSH: 1.08 u[IU]/mL (ref 0.35–5.50)

## 2010-12-11 LAB — PSA: PSA: 1.5 ng/mL (ref 0.10–4.00)

## 2010-12-11 MED ORDER — ATORVASTATIN CALCIUM 40 MG PO TABS: 40.0000 mg | ORAL_TABLET | Freq: Every day | ORAL | Status: DC

## 2010-12-11 MED ORDER — AMLODIPINE BESYLATE 10 MG PO TABS: 10.0000 mg | ORAL_TABLET | Freq: Every day | ORAL | Status: DC

## 2010-12-11 MED ORDER — RAMIPRIL 10 MG PO CAPS: 10.0000 mg | ORAL_CAPSULE | Freq: Every day | ORAL | Status: DC

## 2010-12-11 MED ORDER — LORAZEPAM 1 MG PO TABS: 1.0000 mg | ORAL_TABLET | Freq: Three times a day (TID) | ORAL | Status: DC | PRN

## 2010-12-11 NOTE — Progress Notes (Signed)
  Subjective:    Patient ID: Shaun Mcmillan, male    DOB: 31-Mar-1941, 70 y.o.   MRN: [id number]  HPI 70 yr old male for a cpx. He feels well. His BP at home is stable. He tries to watch his diet closely, especially by limiting his carbohydrate intake. He is due for another colonoscopy this year. He exercises by walking or by going tp the gym 5-6 days a week.    Review of Systems  Constitutional: Negative.   HENT: Negative.   Eyes: Negative.   Respiratory: Negative.   Cardiovascular: Negative.   Gastrointestinal: Negative.   Genitourinary: Negative.   Musculoskeletal: Negative.   Skin: Negative.   Neurological: Negative.   Hematological: Negative.   Psychiatric/Behavioral: Negative.        Objective:   Physical Exam  Constitutional: He is oriented to person, place, and time. He appears well-developed and well-nourished. No distress.  HENT:  Head: Normocephalic and atraumatic.  Right Ear: External ear normal.  Left Ear: External ear normal.  Nose: Nose normal.  Mouth/Throat: Oropharynx is clear and moist. No oropharyngeal exudate.  Eyes: Conjunctivae and EOM are normal. Pupils are equal, round, and reactive to light. Right eye exhibits no discharge. Left eye exhibits no discharge. No scleral icterus.  Neck: Neck supple. No JVD present. No tracheal deviation present. No thyromegaly present.  Cardiovascular: Normal rate, regular rhythm, normal heart sounds and intact distal pulses.  Exam reveals no gallop and no friction rub.   No murmur heard.      EKG normal   Pulmonary/Chest: Effort normal and breath sounds normal. No respiratory distress. He has no wheezes. He has no rales. He exhibits no tenderness.  Abdominal: Soft. Bowel sounds are normal. He exhibits no distension and no mass. There is no tenderness. There is no rebound and no guarding.  Genitourinary: Rectum normal, prostate normal and penis normal. Guaiac negative stool. No penile tenderness.  Musculoskeletal: Normal range  of motion. He exhibits no edema and no tenderness.  Lymphadenopathy:    He has no cervical adenopathy.  Neurological: He is alert and oriented to person, place, and time. He has normal reflexes. No cranial nerve deficit. He exhibits normal muscle tone. Coordination normal.  Skin: Skin is warm and dry. No rash noted. He is not diaphoretic. No erythema. No pallor.  Psychiatric: He has a normal mood and affect. His behavior is normal. Judgment and thought content normal.          Assessment & Plan:  He is doing very well. He will continue the diet and exercise. His HTN and diabetes are well controlled.

## 2010-12-13 ENCOUNTER — Telehealth: Payer: Self-pay

## 2010-12-13 NOTE — Telephone Encounter (Signed)
Message copied by Beverely Low on Thu Dec 13, 2010  9:26 AM ------      Message from: Mervin Kung A      Created: Wed Dec 12, 2010  4:40 PM                   ----- Message -----         From: Lavada Mesi, CMA         Sent: 12/12/2010   8:27 AM           To: Mervin Kung, CMA                        ----- Message -----         From: [name]         Sent: 12/11/2010   4:27 PM           To: Alvino Blood Wyrick, CMA            Normal except mild diabetes. The A1c was 7.0,  So continue dietary management

## 2010-12-13 NOTE — Telephone Encounter (Signed)
Pt notified. Labs mailed, per pt request

## 2011-04-03 ENCOUNTER — Ambulatory Visit (INDEPENDENT_AMBULATORY_CARE_PROVIDER_SITE_OTHER): Payer: Medicare Other | Admitting: Family Medicine

## 2011-04-03 DIAGNOSIS — Z23 Encounter for immunization: Secondary | ICD-10-CM

## 2011-04-17 ENCOUNTER — Other Ambulatory Visit: Payer: Self-pay | Admitting: Family Medicine

## 2011-04-19 ENCOUNTER — Other Ambulatory Visit: Payer: Self-pay | Admitting: Family Medicine

## 2011-04-23 ENCOUNTER — Other Ambulatory Visit: Payer: Self-pay | Admitting: Family Medicine

## 2011-04-23 NOTE — Telephone Encounter (Signed)
Pt has one pill left of amlodipine.

## 2011-04-23 NOTE — Telephone Encounter (Signed)
I called pharmacy and pt has refills left and I spoke with pt.

## 2011-04-23 NOTE — Telephone Encounter (Signed)
Pt called req refill of amLODipine (NORVASC) 10 MG tablet to [facility]. Pt said that pharmacy told pt that Dr Clent Ridges denied pts req for refill. Pls call in refill today.  Pt also needs atorvastatin (LIPITOR) 40 MG tablet and ramipril (ALTACE) 10 MG capsule

## 2011-11-12 ENCOUNTER — Encounter: Payer: Self-pay | Admitting: Family Medicine

## 2011-11-12 ENCOUNTER — Ambulatory Visit (INDEPENDENT_AMBULATORY_CARE_PROVIDER_SITE_OTHER): Payer: Medicare Other | Admitting: Family Medicine

## 2011-11-12 VITALS — BP 140/78 | HR 73 | Temp 97.8°F | Wt 184.0 lb

## 2011-11-12 DIAGNOSIS — I10 Essential (primary) hypertension: Secondary | ICD-10-CM

## 2011-11-12 MED ORDER — HYDROCHLOROTHIAZIDE 25 MG PO TABS: 25.0000 mg | ORAL_TABLET | Freq: Every day | ORAL | Status: DC

## 2011-11-12 NOTE — Progress Notes (Signed)
  Subjective:    Patient ID: Shaun Mcmillan, male    DOB: Dec 27, 1940, 71 y.o.   MRN: [id number]  HPI Here for mild elevations in his BP over the past 2 months. His afternoon BP is often in the 140s systolic over 80s diastolic. He feels fine. He still exercises daily.    Review of Systems  Constitutional: Negative.   Respiratory: Negative.   Cardiovascular: Negative.        Objective:   Physical Exam  Constitutional: He appears well-developed and well-nourished.  Neck: No thyromegaly present.  Cardiovascular: Normal rate, regular rhythm, normal heart sounds and intact distal pulses.   Pulmonary/Chest: Effort normal and breath sounds normal.  Lymphadenopathy:    He has no cervical adenopathy.          Assessment & Plan:  His HTN has worsened slightly. We will add HCTZ to his current meds. Recheck with a cpx in several months

## 2011-12-05 ENCOUNTER — Ambulatory Visit (INDEPENDENT_AMBULATORY_CARE_PROVIDER_SITE_OTHER): Payer: Medicare Other | Admitting: Family Medicine

## 2011-12-05 ENCOUNTER — Encounter: Payer: Self-pay | Admitting: Family Medicine

## 2011-12-05 VITALS — BP 152/84 | Temp 98.3°F | Wt 183.0 lb

## 2011-12-05 DIAGNOSIS — H938X9 Other specified disorders of ear, unspecified ear: Secondary | ICD-10-CM

## 2011-12-05 DIAGNOSIS — B349 Viral infection, unspecified: Secondary | ICD-10-CM

## 2011-12-05 MED ORDER — METHYLPREDNISOLONE 4 MG PO KIT: PACK | ORAL | Status: AC

## 2011-12-05 NOTE — Progress Notes (Signed)
  Subjective:    Patient ID: Shaun Mcmillan, male    DOB: [birth year], 71 y.o.   MRN: [id number]  HPI Here for the sudden onset one week ago of ringing in the right ear and decreased hearing in the right ear. He also developed some mild vertigo 3 days ago. The left side is fine. No HA or ear pain. No URI symptoms.   Review of Systems  Constitutional: Negative.   HENT: Positive for hearing loss and tinnitus. Negative for ear pain, nosebleeds, congestion, sore throat, facial swelling, rhinorrhea, neck pain, neck stiffness, postnasal drip, sinus pressure and ear discharge.   Eyes: Negative.   Respiratory: Negative.   Neurological: Positive for dizziness.       Objective:   Physical Exam  Constitutional: He is oriented to person, place, and time. He appears well-developed and well-nourished. No distress.  HENT:  Head: Normocephalic and atraumatic.  Right Ear: External ear normal.  Left Ear: External ear normal.  Nose: Nose normal.  Mouth/Throat: Oropharynx is clear and moist. No oropharyngeal exudate.  Eyes: Conjunctivae are normal. Pupils are equal, round, and reactive to light.  Neck: No thyromegaly present.  Lymphadenopathy:    He has no cervical adenopathy.  Neurological: He is alert and oriented to person, place, and time. No cranial nerve deficit.          Assessment & Plan:  Probable acute viral infection of the ear. Given a DepoMedrol shot to be followed by a Medrol dose pack starting tomorrow. Recheck in one week

## 2011-12-12 ENCOUNTER — Telehealth: Payer: Self-pay | Admitting: Family Medicine

## 2011-12-12 ENCOUNTER — Encounter: Payer: Self-pay | Admitting: Family Medicine

## 2011-12-12 ENCOUNTER — Ambulatory Visit (INDEPENDENT_AMBULATORY_CARE_PROVIDER_SITE_OTHER): Payer: Medicare Other | Admitting: Family Medicine

## 2011-12-12 VITALS — BP 130/80 | HR 61 | Temp 98.4°F | Wt 184.0 lb

## 2011-12-12 DIAGNOSIS — H6690 Otitis media, unspecified, unspecified ear: Secondary | ICD-10-CM

## 2011-12-12 DIAGNOSIS — H9319 Tinnitus, unspecified ear: Secondary | ICD-10-CM

## 2011-12-12 MED ORDER — RAMIPRIL 10 MG PO CAPS: 10.0000 mg | ORAL_CAPSULE | Freq: Every day | ORAL | Status: DC

## 2011-12-12 MED ORDER — ATORVASTATIN CALCIUM 40 MG PO TABS: 40.0000 mg | ORAL_TABLET | Freq: Every day | ORAL | Status: DC

## 2011-12-12 MED ORDER — AMLODIPINE BESYLATE 10 MG PO TABS: 10.0000 mg | ORAL_TABLET | Freq: Every day | ORAL | Status: DC

## 2011-12-12 NOTE — Progress Notes (Signed)
  Subjective:    Patient ID: Shaun Mcmillan, male    DOB: 1941/01/15, 71 y.o.   MRN: [id number]  HPI Here to follow up on a viral otitis in the right ear. He saw [place] one week ago for the sudden onset of hearing loss and mild pain in the right ear, as well as dizziness. He was given a steroid shot and took a steroid taper pack. Today he feels better, and says that all these symptoms have totally resolved. However he has noticed a slight ringing in the right ear that started a few days ago. This is intermittent, and he can notice this only in a very quiet environment.    Review of Systems  Constitutional: Negative.   HENT: Positive for tinnitus. Negative for hearing loss, ear pain and ear discharge.   Neurological: Negative for dizziness and light-headedness.       Objective:   Physical Exam  Constitutional: He is oriented to person, place, and time. He appears well-developed and well-nourished.  HENT:  Head: Normocephalic and atraumatic.  Right Ear: External ear normal.  Left Ear: External ear normal.  Neurological: He is alert and oriented to person, place, and time.          Assessment & Plan:  This tinnitus is the result of the viral infection. The steroids have helped to resolve the other symptoms. Hopefully the tinnitus will resolve over the next few weeks, but I told him there is a chance that it may be permanent. He will follow up prn

## 2011-12-12 NOTE — Telephone Encounter (Signed)
[facility]

## 2012-02-24 ENCOUNTER — Telehealth: Payer: Self-pay | Admitting: Family Medicine

## 2012-02-24 NOTE — Telephone Encounter (Signed)
Caller: [name]/Patient; Patient Name: Shaun Mcmillan; PCP: Gershon Cr[facility]); Best Callback Phone Number: [phone]; Reason for call: Patient had cold about 2 weeks ago. On 02/23/12 Experienced vertigo with nausea and vomiting.  Took motion sickness medication which helped.  Buzzing with ear feeling "plugged" started on 02/20/12. Affected ear is the right ear.  Patient has tried Zyrtec without results. Also feels like it is "full" under ear, but patient is unable to see or feel swelling. This is a similar pattern patient experienced about a month ago requiring antibiotic treatment.  Triaged using Ear: Symptoms with a disposition to see provider within 24 hours due to feeling of fullness and patient is constantly aware of it during the day. Care advice given.  Appointment scheduled for 02/25/12 @ 9:30 with Dr. Clent Ridges.

## 2012-02-25 ENCOUNTER — Encounter: Payer: Self-pay | Admitting: Family Medicine

## 2012-02-25 ENCOUNTER — Ambulatory Visit (INDEPENDENT_AMBULATORY_CARE_PROVIDER_SITE_OTHER): Payer: Medicare Other | Admitting: Family Medicine

## 2012-02-25 VITALS — BP 128/78 | HR 79 | Temp 98.6°F | Wt 186.0 lb

## 2012-02-25 DIAGNOSIS — J329 Chronic sinusitis, unspecified: Secondary | ICD-10-CM

## 2012-02-25 MED ORDER — AMOXICILLIN-POT CLAVULANATE 875-125 MG PO TABS: 1.0000 | ORAL_TABLET | Freq: Two times a day (BID) | ORAL | Status: AC

## 2012-02-25 NOTE — Progress Notes (Signed)
  Subjective:    Patient ID: Shaun Mcmillan, male    DOB: 1940-11-07, 71 y.o.   MRN: [id number]  HPI Here with 6 days of sinus pressure, PND, blowing brown mucus from the nose, ringing in the ears, and a dry cough. No fever.    Review of Systems  Constitutional: Negative.   HENT: Positive for hearing loss, congestion, rhinorrhea, postnasal drip, sinus pressure and tinnitus. Negative for ear pain and ear discharge.   Eyes: Negative.   Respiratory: Negative.        Objective:   Physical Exam  Constitutional: He appears well-developed and well-nourished.  HENT:  Right Ear: External ear normal.  Left Ear: External ear normal.  Nose: Nose normal.  Mouth/Throat: Oropharynx is clear and moist. No oropharyngeal exudate.  Eyes: Conjunctivae are normal.  Neck: No thyromegaly present.  Pulmonary/Chest: Effort normal and breath sounds normal.  Lymphadenopathy:    He has no cervical adenopathy.          Assessment & Plan:  Add Mucinex D

## 2012-03-20 ENCOUNTER — Ambulatory Visit (INDEPENDENT_AMBULATORY_CARE_PROVIDER_SITE_OTHER): Payer: Medicare Other

## 2012-03-20 DIAGNOSIS — Z23 Encounter for immunization: Secondary | ICD-10-CM

## 2012-03-23 ENCOUNTER — Encounter: Payer: Self-pay | Admitting: Family Medicine

## 2012-03-23 ENCOUNTER — Ambulatory Visit (INDEPENDENT_AMBULATORY_CARE_PROVIDER_SITE_OTHER): Payer: Medicare Other | Admitting: Family Medicine

## 2012-03-23 VITALS — BP 130/78 | HR 90 | Temp 98.5°F | Wt 183.0 lb

## 2012-03-23 DIAGNOSIS — H9319 Tinnitus, unspecified ear: Secondary | ICD-10-CM

## 2012-03-23 DIAGNOSIS — H9190 Unspecified hearing loss, unspecified ear: Secondary | ICD-10-CM

## 2012-03-23 NOTE — Progress Notes (Signed)
  Subjective:    Patient ID: Shaun Mcmillan, male    DOB: [birth year], 71 y.o.   MRN: [id number]  HPI Here for continued buzzing and ringing in the ears and some loss of hearing. He had been suing Mucinex D and stopped about 5 days ago. Now for the past 3 days the symptoms have worsened. No signs of a sinus infection.    Review of Systems  Constitutional: Negative.   HENT: Positive for hearing loss and tinnitus. Negative for ear pain, congestion, postnasal drip, sinus pressure and ear discharge.   Eyes: Negative.   Respiratory: Negative.        Objective:   Physical Exam  Constitutional: He is oriented to person, place, and time. He appears well-developed and well-nourished.  HENT:  Right Ear: External ear normal.  Left Ear: External ear normal.  Nose: Nose normal.  Mouth/Throat: Oropharynx is clear and moist. No oropharyngeal exudate.  Eyes: Conjunctivae normal are normal.  Neck: No thyromegaly present.  Lymphadenopathy:    He has no cervical adenopathy.  Neurological: He is alert and oriented to person, place, and time.          Assessment & Plan:  Tinnitus with some hearing loss. Get back on Mucinex D daily. Refer to ENT

## 2012-05-29 ENCOUNTER — Encounter: Payer: Self-pay | Admitting: Family Medicine

## 2012-05-29 ENCOUNTER — Ambulatory Visit (INDEPENDENT_AMBULATORY_CARE_PROVIDER_SITE_OTHER): Payer: Medicare Other | Admitting: Family Medicine

## 2012-05-29 VITALS — BP 130/70 | HR 89 | Temp 98.0°F | Ht 70.25 in | Wt 180.0 lb

## 2012-05-29 DIAGNOSIS — E119 Type 2 diabetes mellitus without complications: Secondary | ICD-10-CM

## 2012-05-29 DIAGNOSIS — R972 Elevated prostate specific antigen [PSA]: Secondary | ICD-10-CM

## 2012-05-29 DIAGNOSIS — Z0000 Encounter for general adult medical examination without abnormal findings: Secondary | ICD-10-CM

## 2012-05-29 LAB — CBC WITH DIFFERENTIAL/PLATELET
Basophils Absolute: 0 10*3/uL (ref 0.0–0.1)
Eosinophils Absolute: 0.1 10*3/uL (ref 0.0–0.7)
Lymphocytes Relative: 16.7 % (ref 12.0–46.0)
MCHC: 34.6 g/dL (ref 30.0–36.0)
Neutrophils Relative %: 76.5 % (ref 43.0–77.0)
RDW: 13.4 % (ref 11.5–14.6)

## 2012-05-29 LAB — HEMOGLOBIN A1C: Hgb A1c MFr Bld: 7 % — ABNORMAL HIGH (ref 4.6–6.5)

## 2012-05-29 LAB — POCT URINALYSIS DIPSTICK
Bilirubin, UA: NEGATIVE
Blood, UA: NEGATIVE
Glucose, UA: NEGATIVE
Leukocytes, UA: NEGATIVE
Nitrite, UA: NEGATIVE
Spec Grav, UA: 1.015
Urobilinogen, UA: 0.2
pH, UA: 7

## 2012-05-29 LAB — HEPATIC FUNCTION PANEL
ALT: 28 U/L (ref 0–53)
Alkaline Phosphatase: 86 U/L (ref 39–117)
Bilirubin, Direct: 0.2 mg/dL (ref 0.0–0.3)
Total Protein: 7.7 g/dL (ref 6.0–8.3)

## 2012-05-29 LAB — BASIC METABOLIC PANEL
CO2: 30 mEq/L (ref 19–32)
Chloride: 96 mEq/L (ref 96–112)
Glucose, Bld: 144 mg/dL — ABNORMAL HIGH (ref 70–99)
Potassium: 3.6 mEq/L (ref 3.5–5.1)
Sodium: 135 mEq/L (ref 135–145)

## 2012-05-29 LAB — TSH: TSH: 1.8 u[IU]/mL (ref 0.35–5.50)

## 2012-05-29 MED ORDER — LORAZEPAM 1 MG PO TABS: 1.0000 mg | ORAL_TABLET | Freq: Three times a day (TID) | ORAL | Status: DC | PRN

## 2012-05-29 NOTE — Progress Notes (Signed)
Quick Note:  I spoke with pt ______ 

## 2012-05-29 NOTE — Progress Notes (Signed)
  Subjective:    Patient ID: Shaun Mcmillan, male    DOB: 13-Aug-1940, 71 y.o.   MRN: [id number]  HPI 71 yr old male for a cpx. He feels well and has no concerns.    Review of Systems  Constitutional: Negative.   HENT: Negative.   Eyes: Negative.   Respiratory: Negative.   Cardiovascular: Negative.   Gastrointestinal: Negative.   Genitourinary: Negative.   Musculoskeletal: Negative.   Skin: Negative.   Neurological: Negative.   Hematological: Negative.   Psychiatric/Behavioral: Negative.        Objective:   Physical Exam  Constitutional: He is oriented to person, place, and time. He appears well-developed and well-nourished. No distress.  HENT:  Head: Normocephalic and atraumatic.  Right Ear: External ear normal.  Left Ear: External ear normal.  Nose: Nose normal.  Mouth/Throat: Oropharynx is clear and moist. No oropharyngeal exudate.  Eyes: Conjunctivae normal and EOM are normal. Pupils are equal, round, and reactive to light. Right eye exhibits no discharge. Left eye exhibits no discharge. No scleral icterus.  Neck: Neck supple. No JVD present. No tracheal deviation present. No thyromegaly present.  Cardiovascular: Normal rate, regular rhythm, normal heart sounds and intact distal pulses.  Exam reveals no gallop and no friction rub.   No murmur heard.      EKG normal   Pulmonary/Chest: Effort normal and breath sounds normal. No respiratory distress. He has no wheezes. He has no rales. He exhibits no tenderness.  Abdominal: Soft. Bowel sounds are normal. He exhibits no distension and no mass. There is no tenderness. There is no rebound and no guarding.  Genitourinary: Rectum normal, prostate normal and penis normal. Guaiac negative stool. No penile tenderness.  Musculoskeletal: Normal range of motion. He exhibits no edema and no tenderness.  Lymphadenopathy:    He has no cervical adenopathy.  Neurological: He is alert and oriented to person, place, and time. He has normal  reflexes. No cranial nerve deficit. He exhibits normal muscle tone. Coordination normal.  Skin: Skin is warm and dry. No rash noted. He is not diaphoretic. No erythema. No pallor.  Psychiatric: He has a normal mood and affect. His behavior is normal. Judgment and thought content normal.          Assessment & Plan:  Well exam. Get fasting labs

## 2012-05-29 NOTE — Addendum Note (Signed)
Addended by: Gershon Crane A on: 05/29/2012 04:40 PM   Modules accepted: Orders

## 2012-06-02 ENCOUNTER — Telehealth: Payer: Self-pay | Admitting: Family Medicine

## 2012-06-02 DIAGNOSIS — R972 Elevated prostate specific antigen [PSA]: Secondary | ICD-10-CM

## 2012-06-02 NOTE — Telephone Encounter (Signed)
Per Dr. Clent Ridges, okay for pt to have PSA drawn again. I spoke with pt and he is going to schedule the lab appointment.

## 2012-06-03 ENCOUNTER — Other Ambulatory Visit (INDEPENDENT_AMBULATORY_CARE_PROVIDER_SITE_OTHER): Payer: Medicare Other

## 2012-06-03 DIAGNOSIS — R972 Elevated prostate specific antigen [PSA]: Secondary | ICD-10-CM

## 2012-06-03 LAB — PSA: PSA: 2.86 ng/mL (ref 0.10–4.00)

## 2012-06-12 NOTE — Progress Notes (Signed)
Quick Note:  I spoke with pt ______ 

## 2012-06-15 ENCOUNTER — Telehealth: Payer: Self-pay | Admitting: Family Medicine

## 2012-06-15 NOTE — Telephone Encounter (Signed)
[facility] Triage Call Report Triage Record Num: [id number] Operator: Jeraldine Loots Patient Name: Shaun Mcmillan Call Date & Time: 06/13/2012 11:57:22AM Patient Phone: [phone] PCP: Tera Mater. Clent Ridges Patient Gender: Male PCP Fax : [phone] Patient DOB: 05/14/1941 Practice Name: [facility] Reason for Call: Caller: Shaun Mcmillan/Patient; PCP: Gershon Crane [facility]); CB#: [phone]; Call regarding sore throat since 12/26 and is worsening. Also has right ear pain. No fever. His tongue is coated with yellow/white. Also has a cold and cough and has green sputum. Needs to be seen in 24 hours. Triaged per Sore Throat protocal. Sent to UC at [facility] for evaluation. He voiced an understanding. Protocol(s) Used: Sore Throat or Hoarseness Recommended Outcome per Protocol: See Provider within 24 hours Reason for Outcome: Has enlarged tonsils covered by yellow-white patches Care Advice: ~ 12/

## 2012-09-21 ENCOUNTER — Ambulatory Visit (INDEPENDENT_AMBULATORY_CARE_PROVIDER_SITE_OTHER): Payer: Medicare Other | Admitting: Family Medicine

## 2012-09-21 ENCOUNTER — Encounter: Payer: Self-pay | Admitting: Family Medicine

## 2012-09-21 VITALS — BP 120/68 | HR 73 | Temp 98.8°F | Wt 185.0 lb

## 2012-09-21 DIAGNOSIS — S40029A Contusion of unspecified upper arm, initial encounter: Secondary | ICD-10-CM

## 2012-09-21 DIAGNOSIS — S40022A Contusion of left upper arm, initial encounter: Secondary | ICD-10-CM
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
## 2012-10-29 ENCOUNTER — Other Ambulatory Visit: Payer: Self-pay | Admitting: Family Medicine

## 2012-12-30 ENCOUNTER — Other Ambulatory Visit: Payer: Self-pay | Admitting: Family Medicine

## 2013-01-11 ENCOUNTER — Other Ambulatory Visit: Payer: Self-pay | Admitting: Family Medicine

## 2013-01-20 ENCOUNTER — Other Ambulatory Visit: Payer: Self-pay

## 2013-03-09 ENCOUNTER — Ambulatory Visit (INDEPENDENT_AMBULATORY_CARE_PROVIDER_SITE_OTHER): Payer: Medicare Other

## 2013-03-09 DIAGNOSIS — Z23 Encounter for immunization: Secondary | ICD-10-CM

## 2013-05-03 ENCOUNTER — Other Ambulatory Visit: Payer: Self-pay | Admitting: Family Medicine

## 2013-05-31 ENCOUNTER — Telehealth: Payer: Self-pay

## 2013-05-31 NOTE — Telephone Encounter (Signed)
Received a call from pt requesting to have lab work done this month.  Pt has a annual exam in Jan 2015.  Ok to schedule labs this month?

## 2013-06-02 ENCOUNTER — Other Ambulatory Visit: Payer: Self-pay | Admitting: Family Medicine

## 2013-06-02 DIAGNOSIS — N138 Other obstructive and reflux uropathy: Secondary | ICD-10-CM

## 2013-06-02 DIAGNOSIS — N401 Enlarged prostate with lower urinary tract symptoms: Secondary | ICD-10-CM

## 2013-06-02 DIAGNOSIS — Z0000 Encounter for general adult medical examination without abnormal findings: Secondary | ICD-10-CM

## 2013-06-02 DIAGNOSIS — E119 Type 2 diabetes mellitus without complications: Secondary | ICD-10-CM

## 2013-06-02 NOTE — Telephone Encounter (Signed)
Please schedule the pt for lab work.  I have placed the orders in the system.  Thanks!

## 2013-06-02 NOTE — Telephone Encounter (Signed)
Lmom

## 2013-06-02 NOTE — Telephone Encounter (Signed)
Pt has been sch for labwok

## 2013-06-18 ENCOUNTER — Other Ambulatory Visit (INDEPENDENT_AMBULATORY_CARE_PROVIDER_SITE_OTHER): Payer: Medicare Other

## 2013-06-18 DIAGNOSIS — E119 Type 2 diabetes mellitus without complications: Secondary | ICD-10-CM

## 2013-06-18 DIAGNOSIS — E785 Hyperlipidemia, unspecified: Secondary | ICD-10-CM

## 2013-06-18 DIAGNOSIS — N138 Other obstructive and reflux uropathy: Secondary | ICD-10-CM

## 2013-06-18 DIAGNOSIS — Z0000 Encounter for general adult medical examination without abnormal findings: Secondary | ICD-10-CM

## 2013-06-18 DIAGNOSIS — N401 Enlarged prostate with lower urinary tract symptoms: Secondary | ICD-10-CM

## 2013-06-18 LAB — CBC WITH DIFFERENTIAL/PLATELET
Basophils Absolute: 0 10*3/uL (ref 0.0–0.1)
Basophils Relative: 0.3 % (ref 0.0–3.0)
Eosinophils Absolute: 0.1 10*3/uL (ref 0.0–0.7)
Eosinophils Relative: 1.3 % (ref 0.0–5.0)
HCT: 43.6 % (ref 39.0–52.0)
Hemoglobin: 14.9 g/dL (ref 13.0–17.0)
Lymphocytes Relative: 33.7 % (ref 12.0–46.0)
Lymphs Abs: 2.9 10*3/uL (ref 0.7–4.0)
MCHC: 34.2 g/dL (ref 30.0–36.0)
MCV: 87.9 fl (ref 78.0–100.0)
Monocytes Absolute: 0.6 10*3/uL (ref 0.1–1.0)
Monocytes Relative: 6.5 % (ref 3.0–12.0)
Neutro Abs: 4.9 10*3/uL (ref 1.4–7.7)
Neutrophils Relative %: 58.2 % (ref 43.0–77.0)
Platelets: 265 10*3/uL (ref 150.0–400.0)
RBC: 4.96 Mil/uL (ref 4.22–5.81)
RDW: 13.8 % (ref 11.5–14.6)
WBC: 8.5 10*3/uL (ref 4.5–10.5)

## 2013-06-18 LAB — BASIC METABOLIC PANEL
BUN: 13 mg/dL (ref 6–23)
CALCIUM: 9.6 mg/dL (ref 8.4–10.5)
CHLORIDE: 99 meq/L (ref 96–112)
CO2: 29 meq/L (ref 19–32)
CREATININE: 1 mg/dL (ref 0.4–1.5)
GFR: 74.5 mL/min (ref >60.00)
GLUCOSE: 146 mg/dL — AB (ref 70–99)
Potassium: 3.7 mEq/L (ref 3.5–5.1)
Sodium: 137 mEq/L (ref 135–145)

## 2013-06-18 LAB — LIPID PANEL
CHOL/HDL RATIO: 3
CHOLESTEROL: 129 mg/dL (ref 0–200)
HDL: 46.5 mg/dL (ref >39.00)
LDL Cholesterol: 68 mg/dL (ref 0–99)
TRIGLYCERIDES: 72 mg/dL (ref 0.0–149.0)
VLDL: 14.4 mg/dL (ref 0.0–40.0)

## 2013-06-18 LAB — HEPATIC FUNCTION PANEL
ALT: 28 U/L (ref 0–53)
AST: 22 U/L (ref 0–37)
Albumin: 4.5 g/dL (ref 3.5–5.2)
Alkaline Phosphatase: 71 U/L (ref 39–117)
Bilirubin, Direct: 0.2 mg/dL (ref 0.0–0.3)
Total Bilirubin: 1.1 mg/dL (ref 0.3–1.2)
Total Protein: 6.9 g/dL (ref 6.0–8.3)

## 2013-06-18 LAB — PSA: PSA: 1.57 ng/mL (ref 0.10–4.00)

## 2013-06-18 LAB — HEMOGLOBIN A1C: Hgb A1c MFr Bld: 6.8 % — ABNORMAL HIGH (ref 4.6–6.5)

## 2013-06-18 LAB — TSH: TSH: 1.87 u[IU]/mL (ref 0.35–5.50)

## 2013-06-25 ENCOUNTER — Encounter: Payer: Self-pay | Admitting: Family Medicine

## 2013-06-25 ENCOUNTER — Ambulatory Visit (INDEPENDENT_AMBULATORY_CARE_PROVIDER_SITE_OTHER): Payer: Medicare Other | Admitting: Family Medicine

## 2013-06-25 VITALS — BP 128/70 | HR 76 | Temp 98.7°F | Ht 70.0 in | Wt 180.0 lb

## 2013-06-25 DIAGNOSIS — Z23 Encounter for immunization: Secondary | ICD-10-CM

## 2013-06-25 DIAGNOSIS — E785 Hyperlipidemia, unspecified: Secondary | ICD-10-CM

## 2013-06-25 DIAGNOSIS — Z0000 Encounter for general adult medical examination without abnormal findings: Secondary | ICD-10-CM

## 2013-06-25 MED ORDER — AMLODIPINE BESYLATE 10 MG PO TABS: ORAL_TABLET | ORAL | Status: DC

## 2013-06-25 MED ORDER — HYDROCHLOROTHIAZIDE 25 MG PO TABS: ORAL_TABLET | ORAL | Status: DC

## 2013-06-25 MED ORDER — LORAZEPAM 1 MG PO TABS: 1.0000 mg | ORAL_TABLET | Freq: Three times a day (TID) | ORAL | Status: DC | PRN

## 2013-06-25 MED ORDER — RAMIPRIL 10 MG PO CAPS: ORAL_CAPSULE | ORAL | Status: DC

## 2013-06-25 MED ORDER — ATORVASTATIN CALCIUM 40 MG PO TABS: ORAL_TABLET | ORAL | Status: DC

## 2013-06-25 NOTE — Addendum Note (Signed)
Addended by: Aggie Hacker A on: 06/25/2013 12:16 PM   Modules accepted: Orders

## 2013-06-25 NOTE — Progress Notes (Signed)
   Subjective:    Patient ID: Shaun Mcmillan, male    DOB: September 02, 1940, 73 y.o.   MRN: [id number]  HPI 73 yr old male for a cpx. He feels well.    Review of Systems  Constitutional: Negative.   HENT: Negative.   Eyes: Negative.   Respiratory: Negative.   Cardiovascular: Negative.   Gastrointestinal: Negative.   Genitourinary: Negative.   Musculoskeletal: Negative.   Skin: Negative.   Neurological: Negative.   Psychiatric/Behavioral: Negative.        Objective:   Physical Exam  Constitutional: He is oriented to person, place, and time. He appears well-developed and well-nourished. No distress.  HENT:  Head: Normocephalic and atraumatic.  Right Ear: External ear normal.  Left Ear: External ear normal.  Nose: Nose normal.  Mouth/Throat: Oropharynx is clear and moist. No oropharyngeal exudate.  Eyes: Conjunctivae and EOM are normal. Pupils are equal, round, and reactive to light. Right eye exhibits no discharge. Left eye exhibits no discharge. No scleral icterus.  Neck: Neck supple. No JVD present. No tracheal deviation present. No thyromegaly present.  Cardiovascular: Normal rate, regular rhythm, normal heart sounds and intact distal pulses.  Exam reveals no gallop and no friction rub.   No murmur heard. EKG normal   Pulmonary/Chest: Effort normal and breath sounds normal. No respiratory distress. He has no wheezes. He has no rales. He exhibits no tenderness.  Abdominal: Soft. Bowel sounds are normal. He exhibits no distension and no mass. There is no tenderness. There is no rebound and no guarding.  Genitourinary: Rectum normal, prostate normal and penis normal. Guaiac negative stool. No penile tenderness.  Musculoskeletal: Normal range of motion. He exhibits no edema and no tenderness.  Lymphadenopathy:    He has no cervical adenopathy.  Neurological: He is alert and oriented to person, place, and time. He has normal reflexes. No cranial nerve deficit. He exhibits normal  muscle tone. Coordination normal.  Skin: Skin is warm and dry. No rash noted. He is not diaphoretic. No erythema. No pallor.  Psychiatric: He has a normal mood and affect. His behavior is normal. Judgment and thought content normal.          Assessment & Plan:  Well exam.

## 2013-06-25 NOTE — Progress Notes (Signed)
Pre visit review using our clinic review tool, if applicable. No additional management support is needed unless otherwise documented below in the visit note. 

## 2013-12-21 ENCOUNTER — Encounter: Payer: Self-pay | Admitting: Family Medicine

## 2013-12-23 NOTE — Telephone Encounter (Signed)
This is perfectly safe, so go ahead and try it

## 2014-03-11 ENCOUNTER — Ambulatory Visit (INDEPENDENT_AMBULATORY_CARE_PROVIDER_SITE_OTHER): Payer: Medicare Other

## 2014-03-11 ENCOUNTER — Ambulatory Visit: Payer: Medicare Other

## 2014-03-11 DIAGNOSIS — Z23 Encounter for immunization: Secondary | ICD-10-CM

## 2014-06-29 ENCOUNTER — Other Ambulatory Visit: Payer: Self-pay | Admitting: Family Medicine

## 2014-07-13 DIAGNOSIS — H4011X1 Primary open-angle glaucoma, mild stage: Secondary | ICD-10-CM | POA: Diagnosis not present

## 2014-07-19 DIAGNOSIS — L820 Inflamed seborrheic keratosis: Secondary | ICD-10-CM | POA: Diagnosis not present

## 2014-07-19 DIAGNOSIS — Z08 Encounter for follow-up examination after completed treatment for malignant neoplasm: Secondary | ICD-10-CM | POA: Diagnosis not present

## 2014-07-19 DIAGNOSIS — L570 Actinic keratosis: Secondary | ICD-10-CM | POA: Diagnosis not present

## 2014-07-19 DIAGNOSIS — C44329 Squamous cell carcinoma of skin of other parts of face: Secondary | ICD-10-CM | POA: Diagnosis not present

## 2014-07-19 DIAGNOSIS — D225 Melanocytic nevi of trunk: Secondary | ICD-10-CM | POA: Diagnosis not present

## 2014-07-19 DIAGNOSIS — Z85828 Personal history of other malignant neoplasm of skin: Secondary | ICD-10-CM | POA: Diagnosis not present

## 2014-07-19 DIAGNOSIS — L821 Other seborrheic keratosis: Secondary | ICD-10-CM | POA: Diagnosis not present

## 2014-08-16 DIAGNOSIS — C44329 Squamous cell carcinoma of skin of other parts of face: Secondary | ICD-10-CM | POA: Diagnosis not present

## 2014-08-25 ENCOUNTER — Other Ambulatory Visit (INDEPENDENT_AMBULATORY_CARE_PROVIDER_SITE_OTHER): Payer: Medicare Other

## 2014-08-25 DIAGNOSIS — Z0000 Encounter for general adult medical examination without abnormal findings: Secondary | ICD-10-CM | POA: Diagnosis not present

## 2014-08-25 DIAGNOSIS — E785 Hyperlipidemia, unspecified: Secondary | ICD-10-CM | POA: Diagnosis not present

## 2014-08-25 DIAGNOSIS — I10 Essential (primary) hypertension: Secondary | ICD-10-CM | POA: Diagnosis not present

## 2014-08-25 DIAGNOSIS — Z125 Encounter for screening for malignant neoplasm of prostate: Secondary | ICD-10-CM | POA: Diagnosis not present

## 2014-08-25 LAB — LIPID PANEL
CHOLESTEROL: 130 mg/dL (ref 0–200)
HDL: 45.7 mg/dL (ref >39.00)
LDL CALC: 67 mg/dL (ref 0–99)
NONHDL: 84.3
Total CHOL/HDL Ratio: 3
Triglycerides: 87 mg/dL (ref 0.0–149.0)
VLDL: 17.4 mg/dL (ref 0.0–40.0)

## 2014-08-25 LAB — CBC WITH DIFFERENTIAL/PLATELET
BASOS PCT: 0.2 % (ref 0.0–3.0)
Basophils Absolute: 0 10*3/uL (ref 0.0–0.1)
EOS PCT: 1.3 % (ref 0.0–5.0)
Eosinophils Absolute: 0.1 10*3/uL (ref 0.0–0.7)
HEMATOCRIT: 42.8 % (ref 39.0–52.0)
Hemoglobin: 14.9 g/dL (ref 13.0–17.0)
LYMPHS PCT: 44.1 % (ref 12.0–46.0)
Lymphs Abs: 4.3 10*3/uL — ABNORMAL HIGH (ref 0.7–4.0)
MCHC: 34.7 g/dL (ref 30.0–36.0)
MCV: 86.8 fl (ref 78.0–100.0)
MONO ABS: 0.7 10*3/uL (ref 0.1–1.0)
Monocytes Relative: 7.2 % (ref 3.0–12.0)
NEUTROS ABS: 4.6 10*3/uL (ref 1.4–7.7)
NEUTROS PCT: 47.2 % (ref 43.0–77.0)
Platelets: 311 10*3/uL (ref 150.0–400.0)
RBC: 4.93 Mil/uL (ref 4.22–5.81)
RDW: 13.7 % (ref 11.5–15.5)
WBC: 9.8 10*3/uL (ref 4.0–10.5)

## 2014-08-25 LAB — POCT URINALYSIS DIPSTICK
Bilirubin, UA: NEGATIVE
Blood, UA: NEGATIVE
Glucose, UA: NEGATIVE
Ketones, UA: NEGATIVE
Leukocytes, UA: NEGATIVE
Nitrite, UA: NEGATIVE
Protein, UA: NEGATIVE
Spec Grav, UA: 1.01
Urobilinogen, UA: 0.2
pH, UA: 7

## 2014-08-25 LAB — HEPATIC FUNCTION PANEL
ALK PHOS: 104 U/L (ref 39–117)
ALT: 29 U/L (ref 0–53)
AST: 19 U/L (ref 0–37)
Albumin: 4.6 g/dL (ref 3.5–5.2)
BILIRUBIN DIRECT: 0.2 mg/dL (ref 0.0–0.3)
BILIRUBIN TOTAL: 0.8 mg/dL (ref 0.2–1.2)
Total Protein: 7.1 g/dL (ref 6.0–8.3)

## 2014-08-25 LAB — BASIC METABOLIC PANEL
BUN: 15 mg/dL (ref 6–23)
CO2: 31 mEq/L (ref 19–32)
Calcium: 10 mg/dL (ref 8.4–10.5)
Chloride: 99 mEq/L (ref 96–112)
Creatinine, Ser: 1 mg/dL (ref 0.40–1.50)
GFR: 77.69 mL/min (ref >60.00)
Glucose, Bld: 145 mg/dL — ABNORMAL HIGH (ref 70–99)
Potassium: 4.1 mEq/L (ref 3.5–5.1)
Sodium: 137 mEq/L (ref 135–145)

## 2014-08-25 LAB — TSH: TSH: 2.15 u[IU]/mL (ref 0.35–4.50)

## 2014-08-25 LAB — PSA: PSA: 1.42 ng/mL (ref 0.10–4.00)

## 2014-09-01 ENCOUNTER — Ambulatory Visit (INDEPENDENT_AMBULATORY_CARE_PROVIDER_SITE_OTHER): Payer: Medicare Other | Admitting: Family Medicine

## 2014-09-01 ENCOUNTER — Encounter: Payer: Self-pay | Admitting: Family Medicine

## 2014-09-01 VITALS — BP 129/70 | HR 75 | Temp 98.1°F | Ht 70.0 in | Wt 180.0 lb

## 2014-09-01 DIAGNOSIS — Z0000 Encounter for general adult medical examination without abnormal findings: Secondary | ICD-10-CM | POA: Diagnosis not present

## 2014-09-01 DIAGNOSIS — H409 Unspecified glaucoma: Secondary | ICD-10-CM | POA: Insufficient documentation

## 2014-09-01 DIAGNOSIS — E119 Type 2 diabetes mellitus without complications: Secondary | ICD-10-CM

## 2014-09-01 DIAGNOSIS — I10 Essential (primary) hypertension: Secondary | ICD-10-CM

## 2014-09-01 LAB — HEMOGLOBIN A1C: Hgb A1c MFr Bld: 7.5 % — ABNORMAL HIGH (ref 4.6–6.5)

## 2014-09-01 MED ORDER — RAMIPRIL 10 MG PO CAPS: 10.0000 mg | ORAL_CAPSULE | Freq: Every day | ORAL | Status: DC

## 2014-09-01 MED ORDER — HYDROCHLOROTHIAZIDE 25 MG PO TABS: 25.0000 mg | ORAL_TABLET | Freq: Every day | ORAL | Status: DC

## 2014-09-01 MED ORDER — AMLODIPINE BESYLATE 10 MG PO TABS: 10.0000 mg | ORAL_TABLET | Freq: Every day | ORAL | Status: DC

## 2014-09-01 MED ORDER — LORAZEPAM 1 MG PO TABS: 1.0000 mg | ORAL_TABLET | Freq: Three times a day (TID) | ORAL | Status: DC | PRN

## 2014-09-01 MED ORDER — ATORVASTATIN CALCIUM 40 MG PO TABS: 40.0000 mg | ORAL_TABLET | Freq: Every day | ORAL | Status: DC

## 2014-09-01 NOTE — Progress Notes (Signed)
   Subjective:    Patient ID: Shaun Mcmillan, male    DOB: [birth year], 74 y.o.   MRN: [id number]  HPI 74 yr old male for a cpx. He feels well.    Review of Systems  Constitutional: Negative.   HENT: Negative.   Eyes: Negative.   Respiratory: Negative.   Cardiovascular: Negative.   Gastrointestinal: Negative.   Genitourinary: Negative.   Musculoskeletal: Negative.   Skin: Negative.   Neurological: Negative.   Psychiatric/Behavioral: Negative.        Objective:   Physical Exam  Constitutional: He is oriented to person, place, and time. He appears well-developed and well-nourished. No distress.  HENT:  Head: Normocephalic and atraumatic.  Right Ear: External ear normal.  Left Ear: External ear normal.  Nose: Nose normal.  Mouth/Throat: Oropharynx is clear and moist. No oropharyngeal exudate.  Eyes: Conjunctivae and EOM are normal. Pupils are equal, round, and reactive to light. Right eye exhibits no discharge. Left eye exhibits no discharge. No scleral icterus.  Neck: Neck supple. No JVD present. No tracheal deviation present. No thyromegaly present.  Cardiovascular: Normal rate, regular rhythm, normal heart sounds and intact distal pulses.  Exam reveals no gallop and no friction rub.   No murmur heard. EKG normal with an occasional PVC  Pulmonary/Chest: Effort normal and breath sounds normal. No respiratory distress. He has no wheezes. He has no rales. He exhibits no tenderness.  Abdominal: Soft. Bowel sounds are normal. He exhibits no distension and no mass. There is no tenderness. There is no rebound and no guarding.  Genitourinary: Rectum normal, prostate normal and penis normal. Guaiac negative stool. No penile tenderness.  Musculoskeletal: Normal range of motion. He exhibits no edema or tenderness.  Lymphadenopathy:    He has no cervical adenopathy.  Neurological: He is alert and oriented to person, place, and time. He has normal reflexes. No cranial nerve deficit. He  exhibits normal muscle tone. Coordination normal.  Skin: Skin is warm and dry. No rash noted. He is not diaphoretic. No erythema. No pallor.  Psychiatric: He has a normal mood and affect. His behavior is normal. Judgment and thought content normal.          Assessment & Plan:  Well exam. Get an A1c today

## 2014-09-01 NOTE — Progress Notes (Signed)
Pre visit review using our clinic review tool, if applicable. No additional management support is needed unless otherwise documented below in the visit note. 

## 2014-09-02 ENCOUNTER — Telehealth: Payer: Self-pay | Admitting: Family Medicine

## 2014-09-02 NOTE — Telephone Encounter (Signed)
emmi emailed °

## 2014-09-08 MED ORDER — METFORMIN HCL 500 MG PO TABS: 500.0000 mg | ORAL_TABLET | Freq: Two times a day (BID) | ORAL | Status: DC

## 2014-09-08 NOTE — Addendum Note (Signed)
Addended by: Colleen Can on: 09/08/2014 01:07 PM   Modules accepted: Orders

## 2014-10-21 ENCOUNTER — Other Ambulatory Visit: Payer: Self-pay

## 2014-10-21 NOTE — Telephone Encounter (Signed)
[facility] request for 90 supplies of atorvastatin (LIPITOR) 40 MG tablet, ramipril (ALTACE) 10 MG capsule, and hydrochlorothiazide (HYDRODIURIL) 25 MG tablet

## 2014-10-21 NOTE — Telephone Encounter (Signed)
90 day supply also requested for amLODipine (NORVASC) 10 MG tablet

## 2014-10-24 MED ORDER — RAMIPRIL 10 MG PO CAPS: 10.0000 mg | ORAL_CAPSULE | Freq: Every day | ORAL | Status: DC

## 2014-10-24 MED ORDER — ATORVASTATIN CALCIUM 40 MG PO TABS: 40.0000 mg | ORAL_TABLET | Freq: Every day | ORAL | Status: DC

## 2014-10-24 MED ORDER — HYDROCHLOROTHIAZIDE 25 MG PO TABS: 25.0000 mg | ORAL_TABLET | Freq: Every day | ORAL | Status: DC

## 2014-10-24 MED ORDER — AMLODIPINE BESYLATE 10 MG PO TABS: 10.0000 mg | ORAL_TABLET | Freq: Every day | ORAL | Status: DC

## 2014-10-24 NOTE — Telephone Encounter (Signed)
I sent all 4 scripts e-scribe to mail order.

## 2014-11-17 DIAGNOSIS — L570 Actinic keratosis: Secondary | ICD-10-CM | POA: Diagnosis not present

## 2014-11-17 DIAGNOSIS — L821 Other seborrheic keratosis: Secondary | ICD-10-CM | POA: Diagnosis not present

## 2014-11-17 DIAGNOSIS — D1801 Hemangioma of skin and subcutaneous tissue: Secondary | ICD-10-CM | POA: Diagnosis not present

## 2014-11-17 DIAGNOSIS — D225 Melanocytic nevi of trunk: Secondary | ICD-10-CM | POA: Diagnosis not present

## 2014-12-09 ENCOUNTER — Other Ambulatory Visit (INDEPENDENT_AMBULATORY_CARE_PROVIDER_SITE_OTHER): Payer: Medicare Other

## 2014-12-09 DIAGNOSIS — E119 Type 2 diabetes mellitus without complications: Secondary | ICD-10-CM | POA: Diagnosis not present

## 2014-12-09 LAB — HEMOGLOBIN A1C: HEMOGLOBIN A1C: 6.1 % (ref 4.6–6.5)

## 2014-12-14 ENCOUNTER — Encounter: Payer: Self-pay | Admitting: Family Medicine

## 2014-12-14 DIAGNOSIS — E119 Type 2 diabetes mellitus without complications: Secondary | ICD-10-CM

## 2014-12-15 NOTE — Telephone Encounter (Signed)
Yes we will plan on getting an A1c every 3 months

## 2014-12-16 NOTE — Telephone Encounter (Signed)
He already has a future order for this

## 2015-01-10 DIAGNOSIS — H4011X1 Primary open-angle glaucoma, mild stage: Secondary | ICD-10-CM | POA: Diagnosis not present

## 2015-01-10 DIAGNOSIS — H52203 Unspecified astigmatism, bilateral: Secondary | ICD-10-CM | POA: Diagnosis not present

## 2015-01-10 DIAGNOSIS — E119 Type 2 diabetes mellitus without complications: Secondary | ICD-10-CM | POA: Diagnosis not present

## 2015-01-10 DIAGNOSIS — H2513 Age-related nuclear cataract, bilateral: Secondary | ICD-10-CM | POA: Diagnosis not present

## 2015-02-10 DIAGNOSIS — H4011X1 Primary open-angle glaucoma, mild stage: Secondary | ICD-10-CM | POA: Diagnosis not present

## 2015-03-07 ENCOUNTER — Ambulatory Visit: Payer: Medicare Other | Admitting: Family Medicine

## 2015-03-07 DIAGNOSIS — Z23 Encounter for immunization: Secondary | ICD-10-CM

## 2015-03-07 MED ORDER — INFLUENZA VAC SPLIT HIGH-DOSE 0.5 ML IM SUSY
0.5000 mL | PREFILLED_SYRINGE | Freq: Once | INTRAMUSCULAR | Status: AC
  Administered 2015-03-07: 0.5 mL via INTRAMUSCULAR

## 2015-03-20 ENCOUNTER — Other Ambulatory Visit (INDEPENDENT_AMBULATORY_CARE_PROVIDER_SITE_OTHER): Payer: Medicare Other

## 2015-03-20 DIAGNOSIS — E119 Type 2 diabetes mellitus without complications: Secondary | ICD-10-CM

## 2015-03-20 LAB — HEMOGLOBIN A1C: Hgb A1c MFr Bld: 5.9 % (ref 4.6–6.5)

## 2015-05-29 ENCOUNTER — Encounter: Payer: Self-pay | Admitting: Family Medicine

## 2015-05-29 ENCOUNTER — Ambulatory Visit (INDEPENDENT_AMBULATORY_CARE_PROVIDER_SITE_OTHER): Payer: Medicare Other | Admitting: Family Medicine

## 2015-05-29 VITALS — BP 133/72 | HR 66 | Temp 98.0°F | Ht 70.0 in | Wt 162.0 lb

## 2015-05-29 DIAGNOSIS — Z79899 Other long term (current) drug therapy: Secondary | ICD-10-CM

## 2015-05-29 DIAGNOSIS — H532 Diplopia: Secondary | ICD-10-CM | POA: Diagnosis not present

## 2015-05-29 LAB — BASIC METABOLIC PANEL
BUN: 18 mg/dL (ref 6–23)
CALCIUM: 10.6 mg/dL — AB (ref 8.4–10.5)
CHLORIDE: 100 meq/L (ref 96–112)
CO2: 32 mEq/L (ref 19–32)
CREATININE: 0.91 mg/dL (ref 0.40–1.50)
GFR: 86.44 mL/min (ref >60.00)
Glucose, Bld: 104 mg/dL — ABNORMAL HIGH (ref 70–99)
Potassium: 4.6 mEq/L (ref 3.5–5.1)
Sodium: 140 mEq/L (ref 135–145)

## 2015-05-29 LAB — CBC WITH DIFFERENTIAL/PLATELET
BASOS PCT: 0.3 % (ref 0.0–3.0)
Basophils Absolute: 0 10*3/uL (ref 0.0–0.1)
EOS ABS: 0.3 10*3/uL (ref 0.0–0.7)
EOS PCT: 2.1 % (ref 0.0–5.0)
HEMATOCRIT: 45.8 % (ref 39.0–52.0)
HEMOGLOBIN: 15.2 g/dL (ref 13.0–17.0)
LYMPHS PCT: 26.3 % (ref 12.0–46.0)
Lymphs Abs: 3.3 10*3/uL (ref 0.7–4.0)
MCHC: 33.3 g/dL (ref 30.0–36.0)
MCV: 91 fl (ref 78.0–100.0)
MONO ABS: 0.7 10*3/uL (ref 0.1–1.0)
Monocytes Relative: 5.9 % (ref 3.0–12.0)
Neutro Abs: 8.2 10*3/uL — ABNORMAL HIGH (ref 1.4–7.7)
Neutrophils Relative %: 65.4 % (ref 43.0–77.0)
Platelets: 283 10*3/uL (ref 150.0–400.0)
RBC: 5.03 Mil/uL (ref 4.22–5.81)
RDW: 13.7 % (ref 11.5–15.5)
WBC: 12.5 10*3/uL — AB (ref 4.0–10.5)

## 2015-05-29 LAB — VITAMIN B12: VITAMIN B 12: 460 pg/mL (ref 211–911)

## 2015-05-29 LAB — TSH: TSH: 1.39 u[IU]/mL (ref 0.35–4.50)

## 2015-05-29 NOTE — Progress Notes (Signed)
   Subjective:    Patient ID: Shaun Mcmillan, male    DOB: [birth year], 74 y.o.   MRN: [id number]  HPI Here for the sudden onset of intermittent double vision one week ago. No vision blurriness and no loss of visual fields. The double vision appears when he looks to the extreme side, either right or left, and it occurs when focusing on far objects.    Review of Systems  Constitutional: Negative.   HENT: Negative.   Eyes: Positive for visual disturbance. Negative for photophobia, pain, discharge, redness and itching.  Respiratory: Negative.   Cardiovascular: Negative.   Neurological: Negative.        Objective:   Physical Exam  Constitutional: He is oriented to person, place, and time. He appears well-developed and well-nourished. No distress.  HENT:  Head: Normocephalic and atraumatic.  Right Ear: External ear normal.  Left Ear: External ear normal.  Nose: Nose normal.  Mouth/Throat: Oropharynx is clear and moist.  Eyes: Conjunctivae and EOM are normal. Pupils are equal, round, and reactive to light.  Neck: Neck supple. No thyromegaly present.  Cardiovascular: Normal rate, regular rhythm, normal heart sounds and intact distal pulses.   Pulmonary/Chest: Effort normal and breath sounds normal.  Lymphadenopathy:    He has no cervical adenopathy.  Neurological: He is alert and oriented to person, place, and time. No cranial nerve deficit.          Assessment & Plan:  New onset diplopia, we will have him see Dr. Ellie Lunch, his ophthalmologist, ASAP to evaluate. We will screen with labs today as well.

## 2015-05-29 NOTE — Progress Notes (Signed)
Pre visit review using our clinic review tool, if applicable. No additional management support is needed unless otherwise documented below in the visit note. 

## 2015-05-30 ENCOUNTER — Encounter: Payer: Self-pay | Admitting: Family Medicine

## 2015-05-30 ENCOUNTER — Other Ambulatory Visit (INDEPENDENT_AMBULATORY_CARE_PROVIDER_SITE_OTHER): Payer: Medicare Other

## 2015-05-30 DIAGNOSIS — H532 Diplopia: Secondary | ICD-10-CM | POA: Diagnosis not present

## 2015-05-31 LAB — C-REACTIVE PROTEIN: CRP: 0.1 mg/dL — AB (ref 0.5–20.0)

## 2015-05-31 LAB — SEDIMENTATION RATE: Sed Rate: 4 mm/hr (ref 0–22)

## 2015-06-01 NOTE — Telephone Encounter (Signed)
Noted  

## 2015-06-05 DIAGNOSIS — R930 Abnormal findings on diagnostic imaging of skull and head, not elsewhere classified: Secondary | ICD-10-CM | POA: Diagnosis not present

## 2015-06-05 DIAGNOSIS — R9082 White matter disease, unspecified: Secondary | ICD-10-CM | POA: Diagnosis not present

## 2015-06-05 DIAGNOSIS — Z9889 Other specified postprocedural states: Secondary | ICD-10-CM | POA: Diagnosis not present

## 2015-06-05 DIAGNOSIS — H532 Diplopia: Secondary | ICD-10-CM | POA: Diagnosis not present

## 2015-06-26 ENCOUNTER — Other Ambulatory Visit: Payer: Self-pay | Admitting: Family Medicine

## 2015-06-28 ENCOUNTER — Encounter: Payer: Self-pay | Admitting: Family Medicine

## 2015-07-07 DIAGNOSIS — H532 Diplopia: Secondary | ICD-10-CM | POA: Diagnosis not present

## 2015-07-07 DIAGNOSIS — H401111 Primary open-angle glaucoma, right eye, mild stage: Secondary | ICD-10-CM | POA: Diagnosis not present

## 2015-07-07 DIAGNOSIS — H401122 Primary open-angle glaucoma, left eye, moderate stage: Secondary | ICD-10-CM | POA: Diagnosis not present

## 2015-07-12 DIAGNOSIS — L570 Actinic keratosis: Secondary | ICD-10-CM | POA: Diagnosis not present

## 2015-07-12 DIAGNOSIS — L821 Other seborrheic keratosis: Secondary | ICD-10-CM | POA: Diagnosis not present

## 2015-07-12 DIAGNOSIS — C44329 Squamous cell carcinoma of skin of other parts of face: Secondary | ICD-10-CM | POA: Diagnosis not present

## 2015-08-14 ENCOUNTER — Other Ambulatory Visit: Payer: Self-pay | Admitting: Family Medicine

## 2015-08-29 ENCOUNTER — Other Ambulatory Visit (INDEPENDENT_AMBULATORY_CARE_PROVIDER_SITE_OTHER): Payer: Medicare Other

## 2015-08-29 DIAGNOSIS — Z0000 Encounter for general adult medical examination without abnormal findings: Secondary | ICD-10-CM | POA: Diagnosis not present

## 2015-08-29 LAB — BASIC METABOLIC PANEL
BUN: 15 mg/dL (ref 6–23)
CHLORIDE: 99 meq/L (ref 96–112)
CO2: 31 meq/L (ref 19–32)
Calcium: 10 mg/dL (ref 8.4–10.5)
Creatinine, Ser: 0.8 mg/dL (ref 0.40–1.50)
GFR: 100.23 mL/min (ref >60.00)
Glucose, Bld: 106 mg/dL — ABNORMAL HIGH (ref 70–99)
POTASSIUM: 3.7 meq/L (ref 3.5–5.1)
Sodium: 141 mEq/L (ref 135–145)

## 2015-08-29 LAB — MICROALBUMIN / CREATININE URINE RATIO
CREATININE, U: 84.5 mg/dL
Microalb Creat Ratio: 0.8 mg/g (ref 0.0–30.0)

## 2015-08-29 LAB — POC URINALSYSI DIPSTICK (AUTOMATED)
BILIRUBIN UA: NEGATIVE
Glucose, UA: NEGATIVE
Ketones, UA: NEGATIVE
LEUKOCYTES UA: NEGATIVE
NITRITE UA: NEGATIVE
PH UA: 7.5
PROTEIN UA: NEGATIVE
RBC UA: NEGATIVE
Spec Grav, UA: 1.015
UROBILINOGEN UA: 1

## 2015-08-29 LAB — CBC WITH DIFFERENTIAL/PLATELET
BASOS PCT: 0.2 % (ref 0.0–3.0)
Basophils Absolute: 0 10*3/uL (ref 0.0–0.1)
EOS PCT: 1.8 % (ref 0.0–5.0)
Eosinophils Absolute: 0.2 10*3/uL (ref 0.0–0.7)
HCT: 42.9 % (ref 39.0–52.0)
HEMOGLOBIN: 14.5 g/dL (ref 13.0–17.0)
Lymphocytes Relative: 40.8 % (ref 12.0–46.0)
Lymphs Abs: 3.6 10*3/uL (ref 0.7–4.0)
MCHC: 33.8 g/dL (ref 30.0–36.0)
MCV: 89.9 fl (ref 78.0–100.0)
MONO ABS: 0.6 10*3/uL (ref 0.1–1.0)
MONOS PCT: 7 % (ref 3.0–12.0)
Neutro Abs: 4.4 10*3/uL (ref 1.4–7.7)
Neutrophils Relative %: 50.2 % (ref 43.0–77.0)
Platelets: 252 10*3/uL (ref 150.0–400.0)
RBC: 4.77 Mil/uL (ref 4.22–5.81)
RDW: 13.4 % (ref 11.5–15.5)
WBC: 8.8 10*3/uL (ref 4.0–10.5)

## 2015-08-29 LAB — LIPID PANEL
CHOLESTEROL: 122 mg/dL (ref 0–200)
HDL: 53.6 mg/dL (ref >39.00)
LDL Cholesterol: 54 mg/dL (ref 0–99)
NonHDL: 68.54
TRIGLYCERIDES: 74 mg/dL (ref 0.0–149.0)
Total CHOL/HDL Ratio: 2
VLDL: 14.8 mg/dL (ref 0.0–40.0)

## 2015-08-29 LAB — HEPATIC FUNCTION PANEL
ALBUMIN: 4.4 g/dL (ref 3.5–5.2)
ALT: 17 U/L (ref 0–53)
AST: 17 U/L (ref 0–37)
Alkaline Phosphatase: 74 U/L (ref 39–117)
BILIRUBIN TOTAL: 0.9 mg/dL (ref 0.2–1.2)
Bilirubin, Direct: 0.2 mg/dL (ref 0.0–0.3)
TOTAL PROTEIN: 6.7 g/dL (ref 6.0–8.3)

## 2015-08-29 LAB — TSH: TSH: 1.49 u[IU]/mL (ref 0.35–4.50)

## 2015-08-29 LAB — HEMOGLOBIN A1C: HEMOGLOBIN A1C: 6.1 % (ref 4.6–6.5)

## 2015-08-29 LAB — PSA: PSA: 2.72 ng/mL (ref 0.10–4.00)

## 2015-09-05 ENCOUNTER — Ambulatory Visit (INDEPENDENT_AMBULATORY_CARE_PROVIDER_SITE_OTHER): Payer: Medicare Other | Admitting: Family Medicine

## 2015-09-05 ENCOUNTER — Encounter: Payer: Self-pay | Admitting: Family Medicine

## 2015-09-05 VITALS — BP 120/80 | HR 73 | Temp 97.9°F | Ht 69.0 in | Wt 160.0 lb

## 2015-09-05 DIAGNOSIS — I10 Essential (primary) hypertension: Secondary | ICD-10-CM

## 2015-09-05 DIAGNOSIS — Z0000 Encounter for general adult medical examination without abnormal findings: Secondary | ICD-10-CM | POA: Diagnosis not present

## 2015-09-05 DIAGNOSIS — E785 Hyperlipidemia, unspecified: Secondary | ICD-10-CM

## 2015-09-05 DIAGNOSIS — Z23 Encounter for immunization: Secondary | ICD-10-CM

## 2015-09-05 MED ORDER — ATORVASTATIN CALCIUM 40 MG PO TABS: ORAL_TABLET | ORAL | Status: DC

## 2015-09-05 MED ORDER — AMLODIPINE BESYLATE 10 MG PO TABS: ORAL_TABLET | ORAL | Status: DC

## 2015-09-05 MED ORDER — RAMIPRIL 10 MG PO CAPS
ORAL_CAPSULE | ORAL | Status: DC
Start: 2015-09-05 — End: 2016-08-14

## 2015-09-05 MED ORDER — METFORMIN HCL 500 MG PO TABS: 500.0000 mg | ORAL_TABLET | Freq: Two times a day (BID) | ORAL | Status: DC

## 2015-09-05 MED ORDER — LORAZEPAM 1 MG PO TABS: 1.0000 mg | ORAL_TABLET | Freq: Three times a day (TID) | ORAL | Status: DC | PRN

## 2015-09-05 NOTE — Progress Notes (Signed)
Pre visit review using our clinic review tool, if applicable. No additional management support is needed unless otherwise documented below in the visit note. 

## 2015-09-05 NOTE — Progress Notes (Signed)
   Subjective:    Patient ID: Shaun Mcmillan, male    DOB: [birth year], 75 y.o.   MRN: [id number]  HPI 75 yr old male for a cpx. He feels well.    Review of Systems  Constitutional: Negative.   HENT: Negative.   Eyes: Negative.   Respiratory: Negative.   Cardiovascular: Negative.   Gastrointestinal: Negative.   Genitourinary: Negative.   Musculoskeletal: Negative.   Skin: Negative.   Neurological: Negative.   Psychiatric/Behavioral: Negative.        Objective:   Physical Exam  Constitutional: He is oriented to person, place, and time. He appears well-developed and well-nourished. No distress.  HENT:  Head: Normocephalic and atraumatic.  Right Ear: External ear normal.  Left Ear: External ear normal.  Nose: Nose normal.  Mouth/Throat: Oropharynx is clear and moist. No oropharyngeal exudate.  Eyes: Conjunctivae and EOM are normal. Pupils are equal, round, and reactive to light. Right eye exhibits no discharge. Left eye exhibits no discharge. No scleral icterus.  Neck: Neck supple. No JVD present. No tracheal deviation present. No thyromegaly present.  Cardiovascular: Normal rate, regular rhythm, normal heart sounds and intact distal pulses.  Exam reveals no gallop and no friction rub.   No murmur heard. EKG normal   Pulmonary/Chest: Effort normal and breath sounds normal. No respiratory distress. He has no wheezes. He has no rales. He exhibits no tenderness.  Abdominal: Soft. Bowel sounds are normal. He exhibits no distension and no mass. There is no tenderness. There is no rebound and no guarding.  Genitourinary: Rectum normal, prostate normal and penis normal. Guaiac negative stool. No penile tenderness.  Musculoskeletal: Normal range of motion. He exhibits no edema or tenderness.  Lymphadenopathy:    He has no cervical adenopathy.  Neurological: He is alert and oriented to person, place, and time. He has normal reflexes. No cranial nerve deficit. He exhibits normal muscle  tone. Coordination normal.  Skin: Skin is warm and dry. No rash noted. He is not diaphoretic. No erythema. No pallor.  Psychiatric: He has a normal mood and affect. His behavior is normal. Judgment and thought content normal.          Assessment & Plan:  Well exam. We discussed diet and exercise

## 2015-09-07 DIAGNOSIS — L570 Actinic keratosis: Secondary | ICD-10-CM | POA: Diagnosis not present

## 2015-09-07 DIAGNOSIS — Z85828 Personal history of other malignant neoplasm of skin: Secondary | ICD-10-CM | POA: Diagnosis not present

## 2015-09-11 ENCOUNTER — Encounter: Payer: Self-pay | Admitting: Family Medicine

## 2015-09-11 MED ORDER — HYDROCHLOROTHIAZIDE 25 MG PO TABS: ORAL_TABLET | ORAL | Status: DC

## 2016-01-08 NOTE — Progress Notes (Signed)
Error

## 2016-01-22 ENCOUNTER — Telehealth: Payer: Self-pay | Admitting: Family Medicine

## 2016-01-22 DIAGNOSIS — E119 Type 2 diabetes mellitus without complications: Secondary | ICD-10-CM

## 2016-01-22 NOTE — Telephone Encounter (Signed)
I put in the order, please schedule the draw

## 2016-01-22 NOTE — Telephone Encounter (Signed)
Pt states he thought he was to return in 6 months for an A1C, but I do not see an order. Ok to schedule?

## 2016-01-24 NOTE — Telephone Encounter (Signed)
Pt has been scheduled.  °

## 2016-01-31 ENCOUNTER — Encounter: Payer: Self-pay | Admitting: Family Medicine

## 2016-01-31 DIAGNOSIS — E119 Type 2 diabetes mellitus without complications: Secondary | ICD-10-CM | POA: Diagnosis not present

## 2016-01-31 DIAGNOSIS — H401123 Primary open-angle glaucoma, left eye, severe stage: Secondary | ICD-10-CM | POA: Diagnosis not present

## 2016-01-31 DIAGNOSIS — H401111 Primary open-angle glaucoma, right eye, mild stage: Secondary | ICD-10-CM | POA: Diagnosis not present

## 2016-01-31 DIAGNOSIS — H52203 Unspecified astigmatism, bilateral: Secondary | ICD-10-CM | POA: Diagnosis not present

## 2016-01-31 LAB — HM DIABETES EYE EXAM

## 2016-03-08 ENCOUNTER — Other Ambulatory Visit: Payer: Medicare Other

## 2016-03-11 ENCOUNTER — Other Ambulatory Visit (INDEPENDENT_AMBULATORY_CARE_PROVIDER_SITE_OTHER): Payer: Medicare Other

## 2016-03-11 DIAGNOSIS — E119 Type 2 diabetes mellitus without complications: Secondary | ICD-10-CM

## 2016-03-11 LAB — HEMOGLOBIN A1C: HEMOGLOBIN A1C: 5.9 % (ref 4.6–6.5)

## 2016-03-12 DIAGNOSIS — D123 Benign neoplasm of transverse colon: Secondary | ICD-10-CM | POA: Diagnosis not present

## 2016-03-12 DIAGNOSIS — Z86010 Personal history of colonic polyps: Secondary | ICD-10-CM | POA: Diagnosis not present

## 2016-03-12 DIAGNOSIS — D126 Benign neoplasm of colon, unspecified: Secondary | ICD-10-CM | POA: Diagnosis not present

## 2016-03-12 DIAGNOSIS — D125 Benign neoplasm of sigmoid colon: Secondary | ICD-10-CM | POA: Diagnosis not present

## 2016-03-12 DIAGNOSIS — K644 Residual hemorrhoidal skin tags: Secondary | ICD-10-CM | POA: Diagnosis not present

## 2016-03-12 DIAGNOSIS — K5730 Diverticulosis of large intestine without perforation or abscess without bleeding: Secondary | ICD-10-CM | POA: Diagnosis not present

## 2016-03-12 DIAGNOSIS — K635 Polyp of colon: Secondary | ICD-10-CM | POA: Diagnosis not present

## 2016-03-12 DIAGNOSIS — K621 Rectal polyp: Secondary | ICD-10-CM | POA: Diagnosis not present

## 2016-03-12 DIAGNOSIS — K648 Other hemorrhoids: Secondary | ICD-10-CM | POA: Diagnosis not present

## 2016-03-12 HISTORY — PX: OTHER SURGICAL HISTORY: SHX169

## 2016-03-14 DIAGNOSIS — D126 Benign neoplasm of colon, unspecified: Secondary | ICD-10-CM | POA: Diagnosis not present

## 2016-03-14 DIAGNOSIS — K635 Polyp of colon: Secondary | ICD-10-CM | POA: Diagnosis not present

## 2016-03-15 ENCOUNTER — Ambulatory Visit (INDEPENDENT_AMBULATORY_CARE_PROVIDER_SITE_OTHER): Payer: Medicare Other | Admitting: Family Medicine

## 2016-03-15 DIAGNOSIS — Z23 Encounter for immunization: Secondary | ICD-10-CM | POA: Diagnosis not present

## 2016-03-21 DIAGNOSIS — D1801 Hemangioma of skin and subcutaneous tissue: Secondary | ICD-10-CM | POA: Diagnosis not present

## 2016-03-21 DIAGNOSIS — L570 Actinic keratosis: Secondary | ICD-10-CM | POA: Diagnosis not present

## 2016-03-21 DIAGNOSIS — L821 Other seborrheic keratosis: Secondary | ICD-10-CM | POA: Diagnosis not present

## 2016-03-27 DIAGNOSIS — H401122 Primary open-angle glaucoma, left eye, moderate stage: Secondary | ICD-10-CM | POA: Diagnosis not present

## 2016-04-24 DIAGNOSIS — H401111 Primary open-angle glaucoma, right eye, mild stage: Secondary | ICD-10-CM | POA: Diagnosis not present

## 2016-05-22 ENCOUNTER — Ambulatory Visit (INDEPENDENT_AMBULATORY_CARE_PROVIDER_SITE_OTHER): Payer: Medicare Other | Admitting: Family Medicine

## 2016-05-22 ENCOUNTER — Encounter: Payer: Self-pay | Admitting: Family Medicine

## 2016-05-22 VITALS — BP 138/74 | HR 68 | Temp 98.3°F | Ht 69.0 in | Wt 160.0 lb

## 2016-05-22 DIAGNOSIS — R5383 Other fatigue: Secondary | ICD-10-CM

## 2016-05-22 LAB — BASIC METABOLIC PANEL
BUN: 15 mg/dL (ref 6–23)
CHLORIDE: 97 meq/L (ref 96–112)
CO2: 31 mEq/L (ref 19–32)
Calcium: 10.1 mg/dL (ref 8.4–10.5)
Creatinine, Ser: 0.86 mg/dL (ref 0.40–1.50)
GFR: 92.02 mL/min (ref >60.00)
GLUCOSE: 91 mg/dL (ref 70–99)
POTASSIUM: 3.9 meq/L (ref 3.5–5.1)
SODIUM: 136 meq/L (ref 135–145)

## 2016-05-22 LAB — CBC WITH DIFFERENTIAL/PLATELET
BASOS ABS: 0 10*3/uL (ref 0.0–0.1)
Basophils Relative: 0.3 % (ref 0.0–3.0)
EOS PCT: 1.6 % (ref 0.0–5.0)
Eosinophils Absolute: 0.2 10*3/uL (ref 0.0–0.7)
HCT: 43.8 % (ref 39.0–52.0)
Hemoglobin: 14.8 g/dL (ref 13.0–17.0)
LYMPHS ABS: 3.3 10*3/uL (ref 0.7–4.0)
Lymphocytes Relative: 34 % (ref 12.0–46.0)
MCHC: 33.9 g/dL (ref 30.0–36.0)
MCV: 89.8 fl (ref 78.0–100.0)
MONO ABS: 0.7 10*3/uL (ref 0.1–1.0)
MONOS PCT: 7.4 % (ref 3.0–12.0)
NEUTROS ABS: 5.5 10*3/uL (ref 1.4–7.7)
NEUTROS PCT: 56.7 % (ref 43.0–77.0)
PLATELETS: 285 10*3/uL (ref 150.0–400.0)
RBC: 4.88 Mil/uL (ref 4.22–5.81)
RDW: 13.3 % (ref 11.5–15.5)
WBC: 9.8 10*3/uL (ref 4.0–10.5)

## 2016-05-22 LAB — HEPATIC FUNCTION PANEL
ALBUMIN: 4.6 g/dL (ref 3.5–5.2)
ALK PHOS: 90 U/L (ref 39–117)
ALT: 26 U/L (ref 0–53)
AST: 22 U/L (ref 0–37)
BILIRUBIN DIRECT: 0.1 mg/dL (ref 0.0–0.3)
TOTAL PROTEIN: 7.1 g/dL (ref 6.0–8.3)
Total Bilirubin: 0.6 mg/dL (ref 0.2–1.2)

## 2016-05-22 LAB — TSH: TSH: 1.24 u[IU]/mL (ref 0.35–4.50)

## 2016-05-22 LAB — VITAMIN B12: VITAMIN B 12: 516 pg/mL (ref 211–911)

## 2016-05-22 MED ORDER — TEMAZEPAM 15 MG PO CAPS: 15.0000 mg | ORAL_CAPSULE | Freq: Every evening | ORAL | 5 refills | Status: DC | PRN

## 2016-05-22 NOTE — Progress Notes (Signed)
   Subjective:    Patient ID: Shaun Mcmillan, male    DOB: December 07, 1940, 75 y.o.   MRN: [id number]  HPI Here asking about generalized fatigue that started about 4 days ago. He says he can only rakes leaves in his yard for 30 minutes before he gets exhausted and has to stop, for example. No chest pain or SOB. No muscular weakness. About one week ago he developed symptoms of a "head cold" with stuffy head and ears, PND, and a dry cough. These symptoms all went away after a few days, just as the fatigue began to appear. No ST or fever. No recent medication changes. His A1c in September was 5.9.    Review of Systems  Constitutional: Positive for fatigue. Negative for chills, diaphoresis and unexpected weight change.  HENT: Negative.   Eyes: Negative.   Respiratory: Negative.   Cardiovascular: Negative.   Gastrointestinal: Negative.   Endocrine: Negative.   Genitourinary: Negative.   Musculoskeletal: Negative for arthralgias, joint swelling and myalgias.  Neurological: Negative.        Objective:   Physical Exam  Constitutional: He is oriented to person, place, and time. He appears well-developed and well-nourished.  HENT:  Right Ear: External ear normal.  Left Ear: External ear normal.  Nose: Nose normal.  Mouth/Throat: Oropharynx is clear and moist.  Eyes: Conjunctivae are normal.  Neck: No thyromegaly present.  Cardiovascular: Normal rate, regular rhythm, normal heart sounds and intact distal pulses.   Pulmonary/Chest: Effort normal and breath sounds normal.  Lymphadenopathy:    He has no cervical adenopathy.  Neurological: He is alert and oriented to person, place, and time.          Assessment & Plan:  He has generalized fatigue of uncertain etiology, perhaps related to the recent viral URI he had. We will get labs today to evaluate this further.  [name], MD

## 2016-05-22 NOTE — Progress Notes (Signed)
Pre visit review using our clinic review tool, if applicable. No additional management support is needed unless otherwise documented below in the visit note. 

## 2016-05-23 LAB — EPSTEIN-BARR VIRUS VCA, IGG

## 2016-05-23 LAB — EPSTEIN-BARR VIRUS VCA, IGM

## 2016-08-14 ENCOUNTER — Other Ambulatory Visit: Payer: Self-pay | Admitting: Family Medicine

## 2016-09-04 ENCOUNTER — Other Ambulatory Visit: Payer: Self-pay | Admitting: Family Medicine

## 2016-09-04 ENCOUNTER — Other Ambulatory Visit (INDEPENDENT_AMBULATORY_CARE_PROVIDER_SITE_OTHER): Payer: Medicare Other

## 2016-09-04 DIAGNOSIS — Z0000 Encounter for general adult medical examination without abnormal findings: Secondary | ICD-10-CM

## 2016-09-04 DIAGNOSIS — E785 Hyperlipidemia, unspecified: Secondary | ICD-10-CM

## 2016-09-04 DIAGNOSIS — N400 Enlarged prostate without lower urinary tract symptoms: Secondary | ICD-10-CM | POA: Diagnosis not present

## 2016-09-04 DIAGNOSIS — I10 Essential (primary) hypertension: Secondary | ICD-10-CM | POA: Diagnosis not present

## 2016-09-04 DIAGNOSIS — E119 Type 2 diabetes mellitus without complications: Secondary | ICD-10-CM

## 2016-09-04 LAB — PSA: PSA: 1.44 ng/mL (ref 0.10–4.00)

## 2016-09-04 LAB — BASIC METABOLIC PANEL
BUN: 16 mg/dL (ref 6–23)
CO2: 30 mEq/L (ref 19–32)
Calcium: 10 mg/dL (ref 8.4–10.5)
Chloride: 100 mEq/L (ref 96–112)
Creatinine, Ser: 0.86 mg/dL (ref 0.40–1.50)
GFR: 91.95 mL/min (ref >60.00)
GLUCOSE: 127 mg/dL — AB (ref 70–99)
POTASSIUM: 3.6 meq/L (ref 3.5–5.1)
Sodium: 139 mEq/L (ref 135–145)

## 2016-09-04 LAB — LIPID PANEL
Cholesterol: 126 mg/dL (ref 0–200)
HDL: 53.3 mg/dL (ref >39.00)
LDL CALC: 57 mg/dL (ref 0–99)
NonHDL: 72.71
Total CHOL/HDL Ratio: 2
Triglycerides: 80 mg/dL (ref 0.0–149.0)
VLDL: 16 mg/dL (ref 0.0–40.0)

## 2016-09-04 LAB — CBC WITH DIFFERENTIAL/PLATELET
BASOS ABS: 0 10*3/uL (ref 0.0–0.1)
Basophils Relative: 0.2 % (ref 0.0–3.0)
Eosinophils Absolute: 0.1 10*3/uL (ref 0.0–0.7)
Eosinophils Relative: 1.5 % (ref 0.0–5.0)
HEMATOCRIT: 42.3 % (ref 39.0–52.0)
HEMOGLOBIN: 14.6 g/dL (ref 13.0–17.0)
LYMPHS PCT: 41 % (ref 12.0–46.0)
Lymphs Abs: 3.7 10*3/uL (ref 0.7–4.0)
MCHC: 34.4 g/dL (ref 30.0–36.0)
MCV: 88.6 fl (ref 78.0–100.0)
MONOS PCT: 5.9 % (ref 3.0–12.0)
Monocytes Absolute: 0.5 10*3/uL (ref 0.1–1.0)
NEUTROS ABS: 4.6 10*3/uL (ref 1.4–7.7)
Neutrophils Relative %: 51.4 % (ref 43.0–77.0)
Platelets: 278 10*3/uL (ref 150.0–400.0)
RBC: 4.78 Mil/uL (ref 4.22–5.81)
RDW: 13.6 % (ref 11.5–15.5)
WBC: 9 10*3/uL (ref 4.0–10.5)

## 2016-09-04 LAB — POC URINALSYSI DIPSTICK (AUTOMATED)
BILIRUBIN UA: NEGATIVE
GLUCOSE UA: NEGATIVE
Ketones, UA: NEGATIVE
LEUKOCYTES UA: NEGATIVE
NITRITE UA: NEGATIVE
Protein, UA: NEGATIVE
RBC UA: NEGATIVE
Spec Grav, UA: 1.015 (ref 1.030–1.035)
Urobilinogen, UA: 0.2 (ref ?–2.0)
pH, UA: 6 (ref 5.0–8.0)

## 2016-09-04 LAB — HEPATIC FUNCTION PANEL
ALK PHOS: 87 U/L (ref 39–117)
ALT: 16 U/L (ref 0–53)
AST: 17 U/L (ref 0–37)
Albumin: 4.5 g/dL (ref 3.5–5.2)
BILIRUBIN DIRECT: 0.2 mg/dL (ref 0.0–0.3)
BILIRUBIN TOTAL: 0.8 mg/dL (ref 0.2–1.2)
TOTAL PROTEIN: 6.8 g/dL (ref 6.0–8.3)

## 2016-09-04 LAB — HEMOGLOBIN A1C: Hgb A1c MFr Bld: 5.8 % (ref 4.6–6.5)

## 2016-09-04 LAB — TSH: TSH: 2.13 u[IU]/mL (ref 0.35–4.50)

## 2016-09-11 ENCOUNTER — Encounter: Payer: Self-pay | Admitting: Family Medicine

## 2016-09-11 ENCOUNTER — Ambulatory Visit (INDEPENDENT_AMBULATORY_CARE_PROVIDER_SITE_OTHER): Payer: Medicare Other | Admitting: Family Medicine

## 2016-09-11 VITALS — BP 135/73 | HR 74 | Temp 98.0°F | Ht 69.0 in | Wt 158.0 lb

## 2016-09-11 DIAGNOSIS — E119 Type 2 diabetes mellitus without complications: Secondary | ICD-10-CM

## 2016-09-11 DIAGNOSIS — M109 Gout, unspecified: Secondary | ICD-10-CM | POA: Insufficient documentation

## 2016-09-11 DIAGNOSIS — M1000 Idiopathic gout, unspecified site: Secondary | ICD-10-CM

## 2016-09-11 DIAGNOSIS — Z0000 Encounter for general adult medical examination without abnormal findings: Secondary | ICD-10-CM | POA: Diagnosis not present

## 2016-09-11 LAB — URIC ACID: URIC ACID, SERUM: 6.9 mg/dL (ref 4.0–7.8)

## 2016-09-11 MED ORDER — LORAZEPAM 1 MG PO TABS: 1.0000 mg | ORAL_TABLET | Freq: Three times a day (TID) | ORAL | 5 refills | Status: DC | PRN

## 2016-09-11 MED ORDER — RAMIPRIL 10 MG PO CAPS: 10.0000 mg | ORAL_CAPSULE | Freq: Two times a day (BID) | ORAL | 3 refills | Status: DC

## 2016-09-11 NOTE — Progress Notes (Signed)
   Subjective:    Patient ID: Shaun Mcmillan, male    DOB: 1940/11/11, 76 y.o.   MRN: [id number]  HPI 75 yr old male for a well exam. He feels well except for joint pains in the feet. He has a hx of gout, and he has read that HCTZ can raise uric acid levels. He has been on this for HTN for years. His BP has been stable at home.   Review of Systems  Constitutional: Negative.   HENT: Negative.   Eyes: Negative.   Respiratory: Negative.   Cardiovascular: Negative.   Gastrointestinal: Negative.   Genitourinary: Negative.   Musculoskeletal: Positive for arthralgias. Negative for back pain, gait problem, joint swelling, myalgias, neck pain and neck stiffness.  Skin: Negative.   Neurological: Negative.   Psychiatric/Behavioral: Negative.        Objective:   Physical Exam  Constitutional: He is oriented to person, place, and time. He appears well-developed and well-nourished. No distress.  HENT:  Head: Normocephalic and atraumatic.  Right Ear: External ear normal.  Left Ear: External ear normal.  Nose: Nose normal.  Mouth/Throat: Oropharynx is clear and moist. No oropharyngeal exudate.  Eyes: Conjunctivae and EOM are normal. Pupils are equal, round, and reactive to light. Right eye exhibits no discharge. Left eye exhibits no discharge. No scleral icterus.  Neck: Neck supple. No JVD present. No tracheal deviation present. No thyromegaly present.  Cardiovascular: Normal rate, regular rhythm, normal heart sounds and intact distal pulses.  Exam reveals no gallop and no friction rub.   No murmur heard. Pulmonary/Chest: Effort normal and breath sounds normal. No respiratory distress. He has no wheezes. He has no rales. He exhibits no tenderness.  Abdominal: Soft. Bowel sounds are normal. He exhibits no distension and no mass. There is no tenderness. There is no rebound and no guarding.  Genitourinary: Rectum normal, prostate normal and penis normal. Rectal exam shows guaiac negative stool.  No penile tenderness.  Musculoskeletal: Normal range of motion. He exhibits no edema or tenderness.  Lymphadenopathy:    He has no cervical adenopathy.  Neurological: He is alert and oriented to person, place, and time. He has normal reflexes. No cranial nerve deficit. He exhibits normal muscle tone. Coordination normal.  Skin: Skin is warm and dry. No rash noted. He is not diaphoretic. No erythema. No pallor.  Psychiatric: He has a normal mood and affect. His behavior is normal. Judgment and thought content normal.          Assessment & Plan:  Well exam. We discussed diet and exercise. Check a uric acid level today. We agreed to stop the HCTZ and instead we will increase the Ramipril to 10 mg bid.  Alysia Penna, MD

## 2016-09-11 NOTE — Progress Notes (Signed)
Pre visit review using our clinic review tool, if applicable. No additional management support is needed unless otherwise documented below in the visit note. 

## 2016-09-11 NOTE — Patient Instructions (Signed)
WE NOW OFFER   [facility]aun [facility] FAST TRACK!!!  SAME DAY Appointments for ACUTE CARE  Such as: Sprains, Injuries, cuts, abrasions, rashes, muscle pain, joint pain, back pain Colds, flu, sore throats, headache, allergies, cough, fever  Ear pain, sinus and eye infections Abdominal pain, nausea, vomiting, diarrhea, upset stomach Animal/insect bites  3 Easy Ways to Schedule: Walk-In Scheduling Call in scheduling Mychart Sign-up: [email or web address]         

## 2016-10-01 DIAGNOSIS — H401132 Primary open-angle glaucoma, bilateral, moderate stage: Secondary | ICD-10-CM | POA: Diagnosis not present

## 2016-10-14 ENCOUNTER — Encounter: Payer: Self-pay | Admitting: Family Medicine

## 2016-10-16 ENCOUNTER — Telehealth: Payer: Self-pay | Admitting: Family Medicine

## 2016-10-16 NOTE — Telephone Encounter (Signed)
° ° °  Pt request refill of the following:  ramipril (ALTACE) 10 MG capsule  PT WANTS TO MAKE SURE THE SCRIPT SAY TAKE 1 PILL TWICE DAILY  I told him that is what the rx say Can this be resent to the below pharmacy   Phamacy: [facility]

## 2016-10-16 NOTE — Telephone Encounter (Signed)
I sent pt a my chart message, tried to call and no answer.

## 2016-10-29 ENCOUNTER — Other Ambulatory Visit: Payer: Self-pay | Admitting: Family Medicine

## 2017-01-09 DIAGNOSIS — L814 Other melanin hyperpigmentation: Secondary | ICD-10-CM | POA: Diagnosis not present

## 2017-01-09 DIAGNOSIS — L821 Other seborrheic keratosis: Secondary | ICD-10-CM | POA: Diagnosis not present

## 2017-01-09 DIAGNOSIS — L570 Actinic keratosis: Secondary | ICD-10-CM | POA: Diagnosis not present

## 2017-01-09 DIAGNOSIS — D225 Melanocytic nevi of trunk: Secondary | ICD-10-CM | POA: Diagnosis not present

## 2017-01-09 DIAGNOSIS — Z85828 Personal history of other malignant neoplasm of skin: Secondary | ICD-10-CM | POA: Diagnosis not present

## 2017-02-24 DIAGNOSIS — H5213 Myopia, bilateral: Secondary | ICD-10-CM | POA: Diagnosis not present

## 2017-02-24 DIAGNOSIS — E119 Type 2 diabetes mellitus without complications: Secondary | ICD-10-CM | POA: Diagnosis not present

## 2017-02-24 DIAGNOSIS — H401112 Primary open-angle glaucoma, right eye, moderate stage: Secondary | ICD-10-CM | POA: Diagnosis not present

## 2017-02-24 DIAGNOSIS — H401123 Primary open-angle glaucoma, left eye, severe stage: Secondary | ICD-10-CM | POA: Diagnosis not present

## 2017-03-06 ENCOUNTER — Encounter: Payer: Self-pay | Admitting: Family Medicine

## 2017-03-07 ENCOUNTER — Other Ambulatory Visit (INDEPENDENT_AMBULATORY_CARE_PROVIDER_SITE_OTHER): Payer: Medicare Other

## 2017-03-07 ENCOUNTER — Other Ambulatory Visit: Payer: Medicare Other

## 2017-03-07 ENCOUNTER — Telehealth: Payer: Self-pay | Admitting: Family Medicine

## 2017-03-07 DIAGNOSIS — E119 Type 2 diabetes mellitus without complications: Secondary | ICD-10-CM | POA: Diagnosis not present

## 2017-03-07 LAB — HEMOGLOBIN A1C: Hgb A1c MFr Bld: 6 % (ref 4.6–6.5)

## 2017-03-07 NOTE — Telephone Encounter (Signed)
Please tell him his glucose control is excellent

## 2017-03-07 NOTE — Telephone Encounter (Signed)
Sent pt a mychart message. 

## 2017-03-21 ENCOUNTER — Ambulatory Visit (INDEPENDENT_AMBULATORY_CARE_PROVIDER_SITE_OTHER): Payer: Medicare Other

## 2017-03-21 DIAGNOSIS — Z23 Encounter for immunization: Secondary | ICD-10-CM

## 2017-03-31 ENCOUNTER — Other Ambulatory Visit: Payer: Self-pay | Admitting: Family Medicine

## 2017-04-30 DIAGNOSIS — D229 Melanocytic nevi, unspecified: Secondary | ICD-10-CM | POA: Diagnosis not present

## 2017-04-30 DIAGNOSIS — C44311 Basal cell carcinoma of skin of nose: Secondary | ICD-10-CM | POA: Diagnosis not present

## 2017-04-30 DIAGNOSIS — L570 Actinic keratosis: Secondary | ICD-10-CM | POA: Diagnosis not present

## 2017-04-30 DIAGNOSIS — L821 Other seborrheic keratosis: Secondary | ICD-10-CM | POA: Diagnosis not present

## 2017-04-30 DIAGNOSIS — L814 Other melanin hyperpigmentation: Secondary | ICD-10-CM | POA: Diagnosis not present

## 2017-07-02 ENCOUNTER — Other Ambulatory Visit: Payer: Self-pay | Admitting: Family Medicine

## 2017-07-02 NOTE — Telephone Encounter (Signed)
Last OV 09/11/2016  Amlodipine last refilled 03/31/2017 disp 90 with no refills.  Atorvastatin last refilled 03/31/2017 disp 90 with no refills.  Metformin last refilled 03/31/2017 disp 180 with no refills

## 2017-07-09 DIAGNOSIS — D225 Melanocytic nevi of trunk: Secondary | ICD-10-CM | POA: Diagnosis not present

## 2017-07-09 DIAGNOSIS — Z85828 Personal history of other malignant neoplasm of skin: Secondary | ICD-10-CM | POA: Diagnosis not present

## 2017-07-09 DIAGNOSIS — L821 Other seborrheic keratosis: Secondary | ICD-10-CM | POA: Diagnosis not present

## 2017-08-29 DIAGNOSIS — H401123 Primary open-angle glaucoma, left eye, severe stage: Secondary | ICD-10-CM | POA: Diagnosis not present

## 2017-08-29 DIAGNOSIS — H401112 Primary open-angle glaucoma, right eye, moderate stage: Secondary | ICD-10-CM | POA: Diagnosis not present

## 2017-09-17 ENCOUNTER — Encounter: Payer: Self-pay | Admitting: Family Medicine

## 2017-09-17 ENCOUNTER — Ambulatory Visit (INDEPENDENT_AMBULATORY_CARE_PROVIDER_SITE_OTHER): Payer: Medicare Other | Admitting: Family Medicine

## 2017-09-17 VITALS — BP 132/76 | HR 66 | Temp 97.9°F | Ht 69.5 in | Wt 151.2 lb

## 2017-09-17 DIAGNOSIS — Z0000 Encounter for general adult medical examination without abnormal findings: Secondary | ICD-10-CM

## 2017-09-17 DIAGNOSIS — N401 Enlarged prostate with lower urinary tract symptoms: Secondary | ICD-10-CM

## 2017-09-17 DIAGNOSIS — I10 Essential (primary) hypertension: Secondary | ICD-10-CM

## 2017-09-17 DIAGNOSIS — N138 Other obstructive and reflux uropathy: Secondary | ICD-10-CM

## 2017-09-17 DIAGNOSIS — E118 Type 2 diabetes mellitus with unspecified complications: Secondary | ICD-10-CM

## 2017-09-17 DIAGNOSIS — E785 Hyperlipidemia, unspecified: Secondary | ICD-10-CM | POA: Diagnosis not present

## 2017-09-17 LAB — TSH: TSH: 1.46 u[IU]/mL (ref 0.35–4.50)

## 2017-09-17 LAB — CBC WITH DIFFERENTIAL/PLATELET
BASOS ABS: 0 10*3/uL (ref 0.0–0.1)
Basophils Relative: 0.2 % (ref 0.0–3.0)
Eosinophils Absolute: 0.1 10*3/uL (ref 0.0–0.7)
Eosinophils Relative: 1.2 % (ref 0.0–5.0)
HEMATOCRIT: 43.5 % (ref 39.0–52.0)
Hemoglobin: 15 g/dL (ref 13.0–17.0)
LYMPHS PCT: 31.3 % (ref 12.0–46.0)
Lymphs Abs: 2.9 10*3/uL (ref 0.7–4.0)
MCHC: 34.5 g/dL (ref 30.0–36.0)
MCV: 89.2 fl (ref 78.0–100.0)
MONOS PCT: 5.2 % (ref 3.0–12.0)
Monocytes Absolute: 0.5 10*3/uL (ref 0.1–1.0)
Neutro Abs: 5.8 10*3/uL (ref 1.4–7.7)
Neutrophils Relative %: 62.1 % (ref 43.0–77.0)
Platelets: 286 10*3/uL (ref 150.0–400.0)
RBC: 4.87 Mil/uL (ref 4.22–5.81)
RDW: 14.5 % (ref 11.5–15.5)
WBC: 9.4 10*3/uL (ref 4.0–10.5)

## 2017-09-17 LAB — BASIC METABOLIC PANEL
BUN: 10 mg/dL (ref 6–23)
CO2: 31 mEq/L (ref 19–32)
Calcium: 9.7 mg/dL (ref 8.4–10.5)
Chloride: 101 mEq/L (ref 96–112)
Creatinine, Ser: 0.68 mg/dL (ref 0.40–1.50)
GFR: 120.24 mL/min (ref >60.00)
GLUCOSE: 114 mg/dL — AB (ref 70–99)
POTASSIUM: 4.1 meq/L (ref 3.5–5.1)
SODIUM: 139 meq/L (ref 135–145)

## 2017-09-17 LAB — LIPID PANEL
Cholesterol: 116 mg/dL (ref 0–200)
HDL: 57.9 mg/dL (ref >39.00)
LDL CALC: 46 mg/dL (ref 0–99)
NONHDL: 57.95
Total CHOL/HDL Ratio: 2
Triglycerides: 62 mg/dL (ref 0.0–149.0)
VLDL: 12.4 mg/dL (ref 0.0–40.0)

## 2017-09-17 LAB — HEPATIC FUNCTION PANEL
ALK PHOS: 92 U/L (ref 39–117)
ALT: 16 U/L (ref 0–53)
AST: 17 U/L (ref 0–37)
Albumin: 4.4 g/dL (ref 3.5–5.2)
BILIRUBIN DIRECT: 0.2 mg/dL (ref 0.0–0.3)
BILIRUBIN TOTAL: 0.9 mg/dL (ref 0.2–1.2)
Total Protein: 6.9 g/dL (ref 6.0–8.3)

## 2017-09-17 LAB — POCT URINALYSIS DIPSTICK
Bilirubin, UA: NEGATIVE
GLUCOSE UA: NEGATIVE
KETONES UA: NEGATIVE
Leukocytes, UA: NEGATIVE
NITRITE UA: NEGATIVE
Protein, UA: NEGATIVE
RBC UA: NEGATIVE
SPEC GRAV UA: 1.015 (ref 1.010–1.025)
Urobilinogen, UA: 0.2 E.U./dL
pH, UA: 7 (ref 5.0–8.0)

## 2017-09-17 LAB — PSA: PSA: 1.78 ng/mL (ref 0.10–4.00)

## 2017-09-17 LAB — HEMOGLOBIN A1C: Hgb A1c MFr Bld: 6.1 % (ref 4.6–6.5)

## 2017-09-17 MED ORDER — RAMIPRIL 10 MG PO CAPS: 10.0000 mg | ORAL_CAPSULE | Freq: Two times a day (BID) | ORAL | 3 refills | Status: DC

## 2017-09-17 MED ORDER — ATORVASTATIN CALCIUM 40 MG PO TABS: 40.0000 mg | ORAL_TABLET | Freq: Every day | ORAL | 3 refills | Status: DC

## 2017-09-17 MED ORDER — METFORMIN HCL 500 MG PO TABS: ORAL_TABLET | ORAL | 3 refills | Status: DC

## 2017-09-17 MED ORDER — LORAZEPAM 1 MG PO TABS: 1.0000 mg | ORAL_TABLET | Freq: Three times a day (TID) | ORAL | 5 refills | Status: DC | PRN

## 2017-09-17 MED ORDER — AMLODIPINE BESYLATE 10 MG PO TABS: 10.0000 mg | ORAL_TABLET | Freq: Every day | ORAL | 3 refills | Status: DC

## 2017-09-17 NOTE — Progress Notes (Signed)
   Subjective:    Patient ID: Shaun Mcmillan, male    DOB: [birth year], 77 y.o.   MRN: [id number]  HPI Here to follow up on issues. He feels well. His BP is stable. He does not check his glucoses at home. His glaucoma is stable, and his last intraocular pressures were 14 apiece.   Review of Systems  Constitutional: Negative.   HENT: Negative.   Eyes: Negative.   Respiratory: Negative.   Cardiovascular: Negative.   Gastrointestinal: Negative.   Genitourinary: Negative.   Musculoskeletal: Negative.   Skin: Negative.   Neurological: Negative.   Psychiatric/Behavioral: Negative.        Objective:   Physical Exam  Constitutional: He is oriented to person, place, and time. He appears well-developed and well-nourished. No distress.  HENT:  Head: Normocephalic and atraumatic.  Right Ear: External ear normal.  Left Ear: External ear normal.  Nose: Nose normal.  Mouth/Throat: Oropharynx is clear and moist. No oropharyngeal exudate.  Eyes: Pupils are equal, round, and reactive to light. Conjunctivae and EOM are normal. Right eye exhibits no discharge. Left eye exhibits no discharge. No scleral icterus.  Neck: Neck supple. No JVD present. No tracheal deviation present. No thyromegaly present.  Cardiovascular: Normal rate, regular rhythm, normal heart sounds and intact distal pulses. Exam reveals no gallop and no friction rub.  No murmur heard. Pulmonary/Chest: Effort normal and breath sounds normal. No respiratory distress. He has no wheezes. He has no rales. He exhibits no tenderness.  Abdominal: Soft. Bowel sounds are normal. He exhibits no distension and no mass. There is no tenderness. There is no rebound and no guarding.  Genitourinary: Rectum normal, prostate normal and penis normal. Rectal exam shows guaiac negative stool. No penile tenderness.  Musculoskeletal: Normal range of motion. He exhibits no edema or tenderness.  Lymphadenopathy:    He has no cervical adenopathy.    Neurological: He is alert and oriented to person, place, and time. He has normal reflexes. No cranial nerve deficit. He exhibits normal muscle tone. Coordination normal.  Skin: Skin is warm and dry. No rash noted. He is not diaphoretic. No erythema. No pallor.  Psychiatric: He has a normal mood and affect. His behavior is normal. Judgment and thought content normal.          Assessment & Plan:  His HTN and glaucoma and HTN are stable. We will get fasting labs to check his diabetes with an A1c and we will check a lipid panel.  Alysia Penna, MD

## 2017-09-17 NOTE — Addendum Note (Signed)
Addended by: Alysia Penna A on: 09/17/2017 11:59 AM   Modules accepted: Orders

## 2018-01-06 DIAGNOSIS — L570 Actinic keratosis: Secondary | ICD-10-CM | POA: Diagnosis not present

## 2018-01-06 DIAGNOSIS — D1801 Hemangioma of skin and subcutaneous tissue: Secondary | ICD-10-CM | POA: Diagnosis not present

## 2018-01-06 DIAGNOSIS — D229 Melanocytic nevi, unspecified: Secondary | ICD-10-CM | POA: Diagnosis not present

## 2018-01-06 DIAGNOSIS — L821 Other seborrheic keratosis: Secondary | ICD-10-CM | POA: Diagnosis not present

## 2018-01-06 DIAGNOSIS — L814 Other melanin hyperpigmentation: Secondary | ICD-10-CM | POA: Diagnosis not present

## 2018-02-10 ENCOUNTER — Ambulatory Visit (INDEPENDENT_AMBULATORY_CARE_PROVIDER_SITE_OTHER): Payer: Medicare Other | Admitting: Family Medicine

## 2018-02-10 ENCOUNTER — Encounter: Payer: Self-pay | Admitting: Family Medicine

## 2018-02-10 VITALS — BP 112/62 | HR 72 | Temp 97.9°F | Ht 69.5 in | Wt 149.6 lb

## 2018-02-10 DIAGNOSIS — R220 Localized swelling, mass and lump, head: Secondary | ICD-10-CM

## 2018-02-10 DIAGNOSIS — E118 Type 2 diabetes mellitus with unspecified complications: Secondary | ICD-10-CM

## 2018-02-10 NOTE — Progress Notes (Signed)
   Subjective:    Patient ID: Shaun Mcmillan, male    DOB: 11/25/1940, 77 y.o.   MRN: [id number]  HPI Here for 36 hours of swelling in the right cheek area. He said the worst was when he got up this morning but that it has diminished a lot since then. He says it felt warm to the touch this morning and it tingled a bit, but not now. No pain or redness. No pain on chewing. No weakness of his facial muscles. 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   Review of Systems  Constitutional: Negative.   HENT: Positive for facial swelling. Negative for sinus pressure, sinus pain and sore throat.   Eyes: Negative.   Respiratory: Negative.   Skin: Negative for rash.       Objective:   Physical Exam  Constitutional: He is oriented to person, place, and time. He appears well-developed and well-nourished.  HENT:  Right Ear: External ear normal.  Left Ear: External ear normal.  Nose: Nose normal.  Mouth/Throat: Oropharynx is clear and moist.  The right cheek has some very slight swelling, no warmth or tenderness   Eyes: Conjunctivae are normal.  Neck: Neck supple. No thyromegaly present.  Pulmonary/Chest: Effort normal and breath sounds normal.  Lymphadenopathy:    He has no cervical adenopathy.  Neurological: He is alert and oriented to person, place, and time. No cranial nerve deficit.  He has full use of all facial muscles.   Skin:  No rash is seen on the face           Assessment & Plan:  Transient swelling of the right cheek. The etiology of this is not clear. Since the swelling has already come back down a lot we agreed to simply watch it. He will return if he notices any weakness of the facial muscles or sees any type of rash on the face.  Alysia Penna, MD

## 2018-03-11 DIAGNOSIS — H401112 Primary open-angle glaucoma, right eye, moderate stage: Secondary | ICD-10-CM | POA: Diagnosis not present

## 2018-03-11 DIAGNOSIS — H401123 Primary open-angle glaucoma, left eye, severe stage: Secondary | ICD-10-CM | POA: Diagnosis not present

## 2018-03-11 DIAGNOSIS — E119 Type 2 diabetes mellitus without complications: Secondary | ICD-10-CM | POA: Diagnosis not present

## 2018-03-18 ENCOUNTER — Telehealth: Payer: Self-pay | Admitting: Family Medicine

## 2018-03-18 DIAGNOSIS — E118 Type 2 diabetes mellitus with unspecified complications: Secondary | ICD-10-CM

## 2018-03-18 NOTE — Telephone Encounter (Signed)
Unsure if Dr. Sarajane Jews would like to see the pt in OV or just have him come for lab work.  I can call the pt to schedule.  Let me know what to do.

## 2018-03-18 NOTE — Telephone Encounter (Signed)
Last labs were back in 09/2017.  Dr. Sarajane Jews please advise if the pt will need OV or just labs this time.  Thanks

## 2018-03-18 NOTE — Telephone Encounter (Signed)
Copied from [place] [id number]. Topic: Quick Communication - See Telephone Encounter >> Mar 18, 2018  9:25 AM Alfredia Ferguson R wrote: Patient is calling in stating he needs a order put in to get his A1C checked. Please advise patient when order is placed

## 2018-03-19 NOTE — Telephone Encounter (Signed)
Just order an A1c please

## 2018-03-19 NOTE — Telephone Encounter (Signed)
Called and spoke with pt and he is aware of a1c that has been ordered.  He will come in on 10/8 when he gets his flu shot to get the labs done as well.

## 2018-03-24 ENCOUNTER — Other Ambulatory Visit (INDEPENDENT_AMBULATORY_CARE_PROVIDER_SITE_OTHER): Payer: Medicare Other

## 2018-03-24 ENCOUNTER — Ambulatory Visit (INDEPENDENT_AMBULATORY_CARE_PROVIDER_SITE_OTHER): Payer: Medicare Other

## 2018-03-24 DIAGNOSIS — E118 Type 2 diabetes mellitus with unspecified complications: Secondary | ICD-10-CM | POA: Diagnosis not present

## 2018-03-24 DIAGNOSIS — Z23 Encounter for immunization: Secondary | ICD-10-CM | POA: Diagnosis not present

## 2018-03-24 LAB — HEMOGLOBIN A1C: Hgb A1c MFr Bld: 5.8 % (ref 4.6–6.5)

## 2018-03-26 ENCOUNTER — Encounter: Payer: Self-pay | Admitting: *Deleted

## 2018-07-05 ENCOUNTER — Other Ambulatory Visit: Payer: Self-pay | Admitting: Family Medicine

## 2018-07-09 DIAGNOSIS — L821 Other seborrheic keratosis: Secondary | ICD-10-CM | POA: Diagnosis not present

## 2018-07-09 DIAGNOSIS — L570 Actinic keratosis: Secondary | ICD-10-CM | POA: Diagnosis not present

## 2018-07-09 DIAGNOSIS — D229 Melanocytic nevi, unspecified: Secondary | ICD-10-CM | POA: Diagnosis not present

## 2018-07-09 DIAGNOSIS — D1801 Hemangioma of skin and subcutaneous tissue: Secondary | ICD-10-CM | POA: Diagnosis not present

## 2018-07-09 DIAGNOSIS — L814 Other melanin hyperpigmentation: Secondary | ICD-10-CM | POA: Diagnosis not present

## 2018-07-09 DIAGNOSIS — L819 Disorder of pigmentation, unspecified: Secondary | ICD-10-CM | POA: Diagnosis not present

## 2018-07-09 DIAGNOSIS — D485 Neoplasm of uncertain behavior of skin: Secondary | ICD-10-CM | POA: Diagnosis not present

## 2018-07-10 DIAGNOSIS — L821 Other seborrheic keratosis: Secondary | ICD-10-CM | POA: Diagnosis not present

## 2018-09-08 DIAGNOSIS — H401112 Primary open-angle glaucoma, right eye, moderate stage: Secondary | ICD-10-CM | POA: Diagnosis not present

## 2018-09-08 DIAGNOSIS — H401123 Primary open-angle glaucoma, left eye, severe stage: Secondary | ICD-10-CM | POA: Diagnosis not present

## 2018-09-21 ENCOUNTER — Encounter: Payer: Medicare Other | Admitting: Family Medicine

## 2018-10-11 ENCOUNTER — Other Ambulatory Visit: Payer: Self-pay | Admitting: Family Medicine

## 2018-10-19 ENCOUNTER — Telehealth: Payer: Self-pay | Admitting: *Deleted

## 2018-10-19 NOTE — Telephone Encounter (Signed)
Patient called and verbalized he wqas supposed to have a physical in April and it was cancelled. Patient wants to know if he can just have his A1C checked. Clinic RN tried to convince patient to have a telephone/vrtual visit. But patient declined.

## 2018-10-19 NOTE — Telephone Encounter (Signed)
That's okay for now. I ordered the A1c so he can come by to have this drawn

## 2018-10-19 NOTE — Addendum Note (Signed)
Addended by: Alysia Penna A on: 10/19/2018 04:32 PM   Modules accepted: Orders

## 2018-10-21 ENCOUNTER — Other Ambulatory Visit: Payer: Medicare Other

## 2018-10-21 ENCOUNTER — Other Ambulatory Visit (INDEPENDENT_AMBULATORY_CARE_PROVIDER_SITE_OTHER): Payer: Medicare Other

## 2018-10-21 ENCOUNTER — Other Ambulatory Visit: Payer: Self-pay

## 2018-10-21 DIAGNOSIS — E118 Type 2 diabetes mellitus with unspecified complications: Secondary | ICD-10-CM

## 2018-10-21 LAB — HEMOGLOBIN A1C: Hgb A1c MFr Bld: 6.3 % (ref 4.6–6.5)

## 2018-10-26 ENCOUNTER — Encounter: Payer: Self-pay | Admitting: *Deleted

## 2018-11-12 ENCOUNTER — Other Ambulatory Visit: Payer: Self-pay

## 2018-11-12 NOTE — Patient Outreach (Signed)
[facility]) Care Management  11/12/2018  Alvon Nygaard 07-24-1940 [id number]   Medication Adherence call to Mr. Baron Parmelee spoke with patient wife she explain patient has plenty of all his medications and does not need any at this time. Mr. Heiberger is showing past due on Atorvastatin 40 mg,Metformin 500 mg and Ramipril 10 mg under [facility].   [facility] Management Direct Dial [phone]  Fax [phone] [email or web address]

## 2018-12-22 ENCOUNTER — Encounter: Payer: Self-pay | Admitting: Family Medicine

## 2018-12-22 ENCOUNTER — Ambulatory Visit (INDEPENDENT_AMBULATORY_CARE_PROVIDER_SITE_OTHER): Payer: Medicare Other | Admitting: Family Medicine

## 2018-12-22 ENCOUNTER — Other Ambulatory Visit: Payer: Self-pay

## 2018-12-22 VITALS — BP 128/60 | HR 72 | Temp 97.9°F | Ht 69.0 in | Wt 160.1 lb

## 2018-12-22 DIAGNOSIS — E118 Type 2 diabetes mellitus with unspecified complications: Secondary | ICD-10-CM

## 2018-12-22 DIAGNOSIS — M1000 Idiopathic gout, unspecified site: Secondary | ICD-10-CM | POA: Diagnosis not present

## 2018-12-22 DIAGNOSIS — Z0000 Encounter for general adult medical examination without abnormal findings: Secondary | ICD-10-CM | POA: Diagnosis not present

## 2018-12-22 LAB — CBC WITH DIFFERENTIAL/PLATELET
Basophils Absolute: 0 10*3/uL (ref 0.0–0.1)
Basophils Relative: 0.4 % (ref 0.0–3.0)
Eosinophils Absolute: 0.1 10*3/uL (ref 0.0–0.7)
Eosinophils Relative: 1 % (ref 0.0–5.0)
HCT: 42.7 % (ref 39.0–52.0)
Hemoglobin: 14.6 g/dL (ref 13.0–17.0)
Lymphocytes Relative: 31.8 % (ref 12.0–46.0)
Lymphs Abs: 2.8 10*3/uL (ref 0.7–4.0)
MCHC: 34.1 g/dL (ref 30.0–36.0)
MCV: 89.1 fl (ref 78.0–100.0)
Monocytes Absolute: 0.5 10*3/uL (ref 0.1–1.0)
Monocytes Relative: 6.2 % (ref 3.0–12.0)
Neutro Abs: 5.3 10*3/uL (ref 1.4–7.7)
Neutrophils Relative %: 60.6 % (ref 43.0–77.0)
Platelets: 277 10*3/uL (ref 150.0–400.0)
RBC: 4.79 Mil/uL (ref 4.22–5.81)
RDW: 13.6 % (ref 11.5–15.5)
WBC: 8.8 10*3/uL (ref 4.0–10.5)

## 2018-12-22 LAB — BASIC METABOLIC PANEL
BUN: 14 mg/dL (ref 6–23)
CO2: 29 mEq/L (ref 19–32)
Calcium: 9.8 mg/dL (ref 8.4–10.5)
Chloride: 101 mEq/L (ref 96–112)
Creatinine, Ser: 0.81 mg/dL (ref 0.40–1.50)
GFR: 92.14 mL/min (ref >60.00)
Glucose, Bld: 120 mg/dL — ABNORMAL HIGH (ref 70–99)
Potassium: 4.3 mEq/L (ref 3.5–5.1)
Sodium: 139 mEq/L (ref 135–145)

## 2018-12-22 LAB — POC URINALSYSI DIPSTICK (AUTOMATED)
Bilirubin, UA: NEGATIVE
Blood, UA: NEGATIVE
Glucose, UA: NEGATIVE
Ketones, UA: NEGATIVE
Leukocytes, UA: NEGATIVE
Nitrite, UA: NEGATIVE
Protein, UA: NEGATIVE
Spec Grav, UA: 1.01 (ref 1.010–1.025)
Urobilinogen, UA: 0.2 E.U./dL
pH, UA: 8.5 — AB (ref 5.0–8.0)

## 2018-12-22 LAB — LIPID PANEL
Cholesterol: 125 mg/dL (ref 0–200)
HDL: 59 mg/dL (ref >39.00)
LDL Cholesterol: 52 mg/dL (ref 0–99)
NonHDL: 65.61
Total CHOL/HDL Ratio: 2
Triglycerides: 67 mg/dL (ref 0.0–149.0)
VLDL: 13.4 mg/dL (ref 0.0–40.0)

## 2018-12-22 LAB — HEPATIC FUNCTION PANEL
ALT: 19 U/L (ref 0–53)
AST: 18 U/L (ref 0–37)
Albumin: 4.6 g/dL (ref 3.5–5.2)
Alkaline Phosphatase: 102 U/L (ref 39–117)
Bilirubin, Direct: 0.2 mg/dL (ref 0.0–0.3)
Total Bilirubin: 0.8 mg/dL (ref 0.2–1.2)
Total Protein: 7.1 g/dL (ref 6.0–8.3)

## 2018-12-22 LAB — URIC ACID: Uric Acid, Serum: 6.3 mg/dL (ref 4.0–7.8)

## 2018-12-22 LAB — TSH: TSH: 2.01 u[IU]/mL (ref 0.35–4.50)

## 2018-12-22 LAB — HEMOGLOBIN A1C: Hgb A1c MFr Bld: 6.2 % (ref 4.6–6.5)

## 2018-12-22 LAB — PSA: PSA: 1.47 ng/mL (ref 0.10–4.00)

## 2018-12-22 MED ORDER — AMLODIPINE BESYLATE 10 MG PO TABS: 10.0000 mg | ORAL_TABLET | Freq: Every day | ORAL | 3 refills | Status: DC

## 2018-12-22 MED ORDER — LORAZEPAM 1 MG PO TABS: 1.0000 mg | ORAL_TABLET | Freq: Three times a day (TID) | ORAL | 5 refills | Status: DC | PRN

## 2018-12-22 MED ORDER — RAMIPRIL 10 MG PO CAPS: ORAL_CAPSULE | ORAL | 3 refills | Status: DC

## 2018-12-22 MED ORDER — ATORVASTATIN CALCIUM 40 MG PO TABS: 40.0000 mg | ORAL_TABLET | Freq: Every day | ORAL | 3 refills | Status: DC

## 2018-12-22 MED ORDER — METFORMIN HCL 500 MG PO TABS: 500.0000 mg | ORAL_TABLET | Freq: Two times a day (BID) | ORAL | 3 refills | Status: DC

## 2018-12-22 NOTE — Progress Notes (Signed)
   Subjective:    Patient ID: Shaun Mcmillan, male    DOB: 1940/11/12, 78 y.o.   MRN: [id number]  HPI Here for a well exam. He feels well. His glaucoma is well controlled. He says his last exam showed a pressure of 12 in both eyes.    Review of Systems  Constitutional: Negative.   HENT: Negative.   Eyes: Negative.   Respiratory: Negative.   Cardiovascular: Negative.   Gastrointestinal: Negative.   Genitourinary: Negative.   Musculoskeletal: Negative.   Skin: Negative.   Neurological: Negative.   Psychiatric/Behavioral: Negative.        Objective:   Physical Exam Constitutional:      General: He is not in acute distress.    Appearance: He is well-developed. He is not diaphoretic.  HENT:     Head: Normocephalic and atraumatic.     Right Ear: External ear normal.     Left Ear: External ear normal.     Nose: Nose normal.     Mouth/Throat:     Pharynx: No oropharyngeal exudate.  Eyes:     General: No scleral icterus.       Right eye: No discharge.        Left eye: No discharge.     Conjunctiva/sclera: Conjunctivae normal.     Pupils: Pupils are equal, round, and reactive to light.  Neck:     Musculoskeletal: Neck supple.     Thyroid: No thyromegaly.     Vascular: No JVD.     Trachea: No tracheal deviation.  Cardiovascular:     Rate and Rhythm: Normal rate and regular rhythm.     Heart sounds: Normal heart sounds. No murmur. No friction rub. No gallop.   Pulmonary:     Effort: Pulmonary effort is normal. No respiratory distress.     Breath sounds: Normal breath sounds. No wheezing or rales.  Chest:     Chest wall: No tenderness.  Abdominal:     General: Bowel sounds are normal. There is no distension.     Palpations: Abdomen is soft. There is no mass.     Tenderness: There is no abdominal tenderness. There is no guarding or rebound.  Genitourinary:    Penis: Normal. No tenderness.      Prostate: Normal.     Rectum: Normal. Guaiac result negative.   Musculoskeletal: Normal range of motion.        General: No tenderness.  Lymphadenopathy:     Cervical: No cervical adenopathy.  Skin:    General: Skin is warm and dry.     Coloration: Skin is not pale.     Findings: No erythema or rash.  Neurological:     Mental Status: He is alert and oriented to person, place, and time.     Cranial Nerves: No cranial nerve deficit.     Motor: No abnormal muscle tone.     Coordination: Coordination normal.     Deep Tendon Reflexes: Reflexes are normal and symmetric. Reflexes normal.  Psychiatric:        Behavior: Behavior normal.        Thought Content: Thought content normal.        Judgment: Judgment normal.           Assessment & Plan:  Well exam. We discussed diet and exercise. Get fasting labs.  Alysia Penna, MD

## 2018-12-24 ENCOUNTER — Encounter: Payer: Self-pay | Admitting: *Deleted

## 2019-01-07 DIAGNOSIS — L814 Other melanin hyperpigmentation: Secondary | ICD-10-CM | POA: Diagnosis not present

## 2019-01-07 DIAGNOSIS — D1801 Hemangioma of skin and subcutaneous tissue: Secondary | ICD-10-CM | POA: Diagnosis not present

## 2019-01-07 DIAGNOSIS — L570 Actinic keratosis: Secondary | ICD-10-CM | POA: Diagnosis not present

## 2019-01-07 DIAGNOSIS — L821 Other seborrheic keratosis: Secondary | ICD-10-CM | POA: Diagnosis not present

## 2019-01-07 DIAGNOSIS — D229 Melanocytic nevi, unspecified: Secondary | ICD-10-CM | POA: Diagnosis not present

## 2019-03-02 ENCOUNTER — Ambulatory Visit (INDEPENDENT_AMBULATORY_CARE_PROVIDER_SITE_OTHER): Payer: Medicare Other | Admitting: *Deleted

## 2019-03-02 ENCOUNTER — Other Ambulatory Visit: Payer: Self-pay

## 2019-03-02 DIAGNOSIS — Z23 Encounter for immunization: Secondary | ICD-10-CM | POA: Diagnosis not present

## 2019-03-02 NOTE — Progress Notes (Signed)
Patient in clinic today for flu vaccine. Flu vaccine administered with no reactions.

## 2019-03-05 ENCOUNTER — Other Ambulatory Visit: Payer: Self-pay

## 2019-03-05 NOTE — Patient Outreach (Signed)
[facility]) Care Management  03/05/2019  Chace Sneary 06/18/1940 [id number]   Medication Adherence call to Mr. Enzo Simonin [facility] spoke with patient he is past due on Ramipril 10 mg,Atorvastatin 40 mg and Metformin 500 mg patient explain he is taking this medications and has plenty at this time patient will place an order patient did not want any assistance with ordering thru [facility]. Mr. Eadie is showing past due under [place].   [facility] Management Direct Dial [phone]  Fax [phone] [email or web address]

## 2019-03-25 DIAGNOSIS — H401112 Primary open-angle glaucoma, right eye, moderate stage: Secondary | ICD-10-CM | POA: Diagnosis not present

## 2019-03-25 DIAGNOSIS — E119 Type 2 diabetes mellitus without complications: Secondary | ICD-10-CM | POA: Diagnosis not present

## 2019-03-25 DIAGNOSIS — H524 Presbyopia: Secondary | ICD-10-CM | POA: Diagnosis not present

## 2019-03-25 DIAGNOSIS — H401123 Primary open-angle glaucoma, left eye, severe stage: Secondary | ICD-10-CM | POA: Diagnosis not present

## 2019-06-01 ENCOUNTER — Encounter: Payer: Self-pay | Admitting: Family Medicine

## 2019-07-06 ENCOUNTER — Ambulatory Visit: Payer: Medicare Other | Attending: Internal Medicine

## 2019-07-06 DIAGNOSIS — Z23 Encounter for immunization: Secondary | ICD-10-CM | POA: Insufficient documentation

## 2019-07-06 NOTE — Progress Notes (Signed)
   Covid-19 Vaccination Clinic  Name:  Shaun Mcmillan    MRN: [id number] DOB: 19-Dec-1940  07/06/2019  [name] was observed post Covid-19 immunization for 15 minutes without incidence. He was provided with Vaccine Information Sheet and instruction to access the V-Safe system.   [name] was instructed to call 911 with any severe reactions post vaccine: Marland Kitchen Difficulty breathing  . Swelling of your face and throat  . A fast heartbeat  . A bad rash all over your body  . Dizziness and weakness    Immunizations Administered    Name Date Dose VIS Date Route   Pfizer COVID-19 Vaccine 07/06/2019 11:26 AM 0.3 mL 05/28/2019 Intramuscular   Manufacturer: [place]   Lot: [id number]   [place]: [id number]

## 2019-07-14 ENCOUNTER — Encounter: Payer: Self-pay | Admitting: Family Medicine

## 2019-07-14 DIAGNOSIS — D225 Melanocytic nevi of trunk: Secondary | ICD-10-CM | POA: Diagnosis not present

## 2019-07-14 DIAGNOSIS — L820 Inflamed seborrheic keratosis: Secondary | ICD-10-CM | POA: Diagnosis not present

## 2019-07-14 DIAGNOSIS — D2272 Melanocytic nevi of left lower limb, including hip: Secondary | ICD-10-CM | POA: Diagnosis not present

## 2019-07-14 DIAGNOSIS — D485 Neoplasm of uncertain behavior of skin: Secondary | ICD-10-CM | POA: Diagnosis not present

## 2019-07-14 DIAGNOSIS — D1801 Hemangioma of skin and subcutaneous tissue: Secondary | ICD-10-CM | POA: Diagnosis not present

## 2019-07-14 DIAGNOSIS — L905 Scar conditions and fibrosis of skin: Secondary | ICD-10-CM | POA: Diagnosis not present

## 2019-07-14 DIAGNOSIS — D0439 Carcinoma in situ of skin of other parts of face: Secondary | ICD-10-CM | POA: Diagnosis not present

## 2019-07-14 DIAGNOSIS — C4441 Basal cell carcinoma of skin of scalp and neck: Secondary | ICD-10-CM | POA: Diagnosis not present

## 2019-07-14 DIAGNOSIS — R238 Other skin changes: Secondary | ICD-10-CM | POA: Diagnosis not present

## 2019-07-14 DIAGNOSIS — L821 Other seborrheic keratosis: Secondary | ICD-10-CM | POA: Diagnosis not present

## 2019-07-27 ENCOUNTER — Ambulatory Visit: Payer: Medicare Other | Attending: Internal Medicine

## 2019-07-27 DIAGNOSIS — Z23 Encounter for immunization: Secondary | ICD-10-CM | POA: Insufficient documentation

## 2019-07-27 NOTE — Progress Notes (Signed)
   Covid-19 Vaccination Clinic  Name:  Jarad Kincheloe    MRN: [id number] DOB: 1941/06/08  07/27/2019  Mr. Wysong was observed post Covid-19 immunization for 15 minutes without incidence. He was provided with Vaccine Information Sheet and instruction to access the V-Safe system.   Mr. Kasdorf was instructed to call 911 with any severe reactions post vaccine: Marland Kitchen Difficulty breathing  . Swelling of your face and throat  . A fast heartbeat  . A bad rash all over your body  . Dizziness and weakness    Immunizations Administered    Name Date Dose VIS Date Route   Pfizer COVID-19 Vaccine 07/27/2019 11:31 AM 0.3 mL 05/28/2019 Intramuscular   Manufacturer: [place]   Lot: [id number]   [place]: [id number]

## 2019-08-02 DIAGNOSIS — L570 Actinic keratosis: Secondary | ICD-10-CM | POA: Diagnosis not present

## 2019-08-02 DIAGNOSIS — C44329 Squamous cell carcinoma of skin of other parts of face: Secondary | ICD-10-CM | POA: Diagnosis not present

## 2019-08-02 DIAGNOSIS — D485 Neoplasm of uncertain behavior of skin: Secondary | ICD-10-CM | POA: Diagnosis not present

## 2019-08-13 DIAGNOSIS — L905 Scar conditions and fibrosis of skin: Secondary | ICD-10-CM | POA: Diagnosis not present

## 2019-08-13 DIAGNOSIS — D485 Neoplasm of uncertain behavior of skin: Secondary | ICD-10-CM | POA: Diagnosis not present

## 2019-08-26 DIAGNOSIS — C44319 Basal cell carcinoma of skin of other parts of face: Secondary | ICD-10-CM | POA: Diagnosis not present

## 2019-08-26 DIAGNOSIS — C44329 Squamous cell carcinoma of skin of other parts of face: Secondary | ICD-10-CM | POA: Diagnosis not present

## 2019-09-09 DIAGNOSIS — C44319 Basal cell carcinoma of skin of other parts of face: Secondary | ICD-10-CM | POA: Diagnosis not present

## 2019-09-15 ENCOUNTER — Encounter: Payer: Self-pay | Admitting: Family Medicine

## 2019-09-15 DIAGNOSIS — L989 Disorder of the skin and subcutaneous tissue, unspecified: Secondary | ICD-10-CM | POA: Diagnosis not present

## 2019-09-15 DIAGNOSIS — D485 Neoplasm of uncertain behavior of skin: Secondary | ICD-10-CM | POA: Diagnosis not present

## 2019-09-20 DIAGNOSIS — H401123 Primary open-angle glaucoma, left eye, severe stage: Secondary | ICD-10-CM | POA: Diagnosis not present

## 2019-12-15 DIAGNOSIS — Z85828 Personal history of other malignant neoplasm of skin: Secondary | ICD-10-CM | POA: Diagnosis not present

## 2019-12-15 DIAGNOSIS — L570 Actinic keratosis: Secondary | ICD-10-CM | POA: Diagnosis not present

## 2019-12-15 DIAGNOSIS — D485 Neoplasm of uncertain behavior of skin: Secondary | ICD-10-CM | POA: Diagnosis not present

## 2019-12-15 DIAGNOSIS — D1801 Hemangioma of skin and subcutaneous tissue: Secondary | ICD-10-CM | POA: Diagnosis not present

## 2019-12-15 DIAGNOSIS — D2272 Melanocytic nevi of left lower limb, including hip: Secondary | ICD-10-CM | POA: Diagnosis not present

## 2019-12-15 DIAGNOSIS — L905 Scar conditions and fibrosis of skin: Secondary | ICD-10-CM | POA: Diagnosis not present

## 2019-12-15 DIAGNOSIS — C4442 Squamous cell carcinoma of skin of scalp and neck: Secondary | ICD-10-CM | POA: Diagnosis not present

## 2019-12-15 DIAGNOSIS — D225 Melanocytic nevi of trunk: Secondary | ICD-10-CM | POA: Diagnosis not present

## 2019-12-15 DIAGNOSIS — C4441 Basal cell carcinoma of skin of scalp and neck: Secondary | ICD-10-CM | POA: Diagnosis not present

## 2019-12-15 DIAGNOSIS — L821 Other seborrheic keratosis: Secondary | ICD-10-CM | POA: Diagnosis not present

## 2019-12-28 ENCOUNTER — Other Ambulatory Visit: Payer: Self-pay | Admitting: Family Medicine

## 2020-01-10 DIAGNOSIS — C4442 Squamous cell carcinoma of skin of scalp and neck: Secondary | ICD-10-CM | POA: Diagnosis not present

## 2020-01-10 DIAGNOSIS — C4441 Basal cell carcinoma of skin of scalp and neck: Secondary | ICD-10-CM | POA: Diagnosis not present

## 2020-02-10 ENCOUNTER — Encounter: Payer: Self-pay | Admitting: Family Medicine

## 2020-02-10 ENCOUNTER — Other Ambulatory Visit: Payer: Self-pay

## 2020-02-10 ENCOUNTER — Ambulatory Visit (INDEPENDENT_AMBULATORY_CARE_PROVIDER_SITE_OTHER): Payer: Medicare Other | Admitting: Family Medicine

## 2020-02-10 VITALS — BP 100/58 | HR 68 | Temp 98.1°F | Ht 69.0 in | Wt 158.2 lb

## 2020-02-10 DIAGNOSIS — E785 Hyperlipidemia, unspecified: Secondary | ICD-10-CM | POA: Diagnosis not present

## 2020-02-10 DIAGNOSIS — E118 Type 2 diabetes mellitus with unspecified complications: Secondary | ICD-10-CM

## 2020-02-10 DIAGNOSIS — E538 Deficiency of other specified B group vitamins: Secondary | ICD-10-CM

## 2020-02-10 DIAGNOSIS — I10 Essential (primary) hypertension: Secondary | ICD-10-CM | POA: Diagnosis not present

## 2020-02-10 DIAGNOSIS — E559 Vitamin D deficiency, unspecified: Secondary | ICD-10-CM

## 2020-02-10 DIAGNOSIS — N138 Other obstructive and reflux uropathy: Secondary | ICD-10-CM | POA: Diagnosis not present

## 2020-02-10 DIAGNOSIS — N401 Enlarged prostate with lower urinary tract symptoms: Secondary | ICD-10-CM

## 2020-02-10 MED ORDER — METFORMIN HCL 500 MG PO TABS: ORAL_TABLET | ORAL | 3 refills | Status: DC

## 2020-02-10 MED ORDER — RAMIPRIL 10 MG PO CAPS: ORAL_CAPSULE | ORAL | 3 refills | Status: DC

## 2020-02-10 MED ORDER — AMLODIPINE BESYLATE 10 MG PO TABS: 10.0000 mg | ORAL_TABLET | Freq: Every day | ORAL | 3 refills | Status: DC

## 2020-02-10 MED ORDER — LORAZEPAM 1 MG PO TABS: 1.0000 mg | ORAL_TABLET | Freq: Three times a day (TID) | ORAL | 5 refills | Status: DC | PRN

## 2020-02-10 MED ORDER — ATORVASTATIN CALCIUM 40 MG PO TABS: 40.0000 mg | ORAL_TABLET | Freq: Every day | ORAL | 3 refills | Status: DC

## 2020-02-10 NOTE — Progress Notes (Signed)
   Subjective:    Patient ID: Shaun Mcmillan, male    DOB: 05-31-1941, 79 y.o.   MRN: [id number]  HPI Here to follow up on issues. He feels well. His BP is stable. His glaucoma is stable. He normally does not check his glucoses but he watches his diet closely.    Review of Systems  Constitutional: Negative.   HENT: Negative.   Eyes: Negative.   Respiratory: Negative.   Cardiovascular: Negative.   Gastrointestinal: Negative.   Genitourinary: Negative.   Musculoskeletal: Negative.   Skin: Negative.   Neurological: Negative.   Psychiatric/Behavioral: Negative.        Objective:   Physical Exam Constitutional:      General: He is not in acute distress.    Appearance: He is well-developed. He is not diaphoretic.  HENT:     Head: Normocephalic and atraumatic.     Right Ear: External ear normal.     Left Ear: External ear normal.     Nose: Nose normal.     Mouth/Throat:     Pharynx: No oropharyngeal exudate.  Eyes:     General: No scleral icterus.       Right eye: No discharge.        Left eye: No discharge.     Conjunctiva/sclera: Conjunctivae normal.     Pupils: Pupils are equal, round, and reactive to light.  Neck:     Thyroid: No thyromegaly.     Vascular: No JVD.     Trachea: No tracheal deviation.  Cardiovascular:     Rate and Rhythm: Normal rate and regular rhythm.     Heart sounds: Normal heart sounds. No murmur heard.  No friction rub. No gallop.   Pulmonary:     Effort: Pulmonary effort is normal. No respiratory distress.     Breath sounds: Normal breath sounds. No wheezing or rales.  Chest:     Chest wall: No tenderness.  Abdominal:     General: Bowel sounds are normal. There is no distension.     Palpations: Abdomen is soft. There is no mass.     Tenderness: There is no abdominal tenderness. There is no guarding or rebound.  Genitourinary:    Penis: Normal. No tenderness.      Testes: Normal.     Prostate: Normal.     Rectum: Normal. Guaiac result  negative.  Musculoskeletal:        General: No tenderness. Normal range of motion.     Cervical back: Neck supple.  Lymphadenopathy:     Cervical: No cervical adenopathy.  Skin:    General: Skin is warm and dry.     Coloration: Skin is not pale.     Findings: No erythema or rash.  Neurological:     Mental Status: He is alert and oriented to person, place, and time.     Cranial Nerves: No cranial nerve deficit.     Motor: No abnormal muscle tone.     Coordination: Coordination normal.     Deep Tendon Reflexes: Reflexes are normal and symmetric. Reflexes normal.  Psychiatric:        Behavior: Behavior normal.        Thought Content: Thought content normal.        Judgment: Judgment normal.           Assessment & Plan:  His HTN and glaucoma and BPH are stable. We will get fasting labs to check an A1c, lipids, etc.  Alysia Penna, MD

## 2020-02-11 LAB — BASIC METABOLIC PANEL
BUN: 16 mg/dL (ref 7–25)
CO2: 28 mmol/L (ref 20–32)
Calcium: 10.4 mg/dL — ABNORMAL HIGH (ref 8.6–10.3)
Chloride: 102 mmol/L (ref 98–110)
Creat: 0.88 mg/dL (ref 0.70–1.18)
Glucose, Bld: 114 mg/dL — ABNORMAL HIGH (ref 65–99)
Potassium: 4 mmol/L (ref 3.5–5.3)
Sodium: 139 mmol/L (ref 135–146)

## 2020-02-11 LAB — CBC WITH DIFFERENTIAL/PLATELET
Absolute Monocytes: 624 cells/uL (ref 200–950)
Basophils Absolute: 30 cells/uL (ref 0–200)
Basophils Relative: 0.3 %
Eosinophils Absolute: 99 cells/uL (ref 15–500)
Eosinophils Relative: 1 %
HCT: 43.4 % (ref 38.5–50.0)
Hemoglobin: 14.9 g/dL (ref 13.2–17.1)
Lymphs Abs: 3247 cells/uL (ref 850–3900)
MCH: 30 pg (ref 27.0–33.0)
MCHC: 34.3 g/dL (ref 32.0–36.0)
MCV: 87.3 fL (ref 80.0–100.0)
MPV: 10.2 fL (ref 7.5–12.5)
Monocytes Relative: 6.3 %
Neutro Abs: 5900 cells/uL (ref 1500–7800)
Neutrophils Relative %: 59.6 %
Platelets: 279 10*3/uL (ref 140–400)
RBC: 4.97 10*6/uL (ref 4.20–5.80)
RDW: 13.2 % (ref 11.0–15.0)
Total Lymphocyte: 32.8 %
WBC: 9.9 10*3/uL (ref 3.8–10.8)

## 2020-02-11 LAB — HEPATIC FUNCTION PANEL
AG Ratio: 1.8 (calc) (ref 1.0–2.5)
ALT: 18 U/L (ref 9–46)
AST: 18 U/L (ref 10–35)
Albumin: 4.5 g/dL (ref 3.6–5.1)
Alkaline phosphatase (APISO): 103 U/L (ref 35–144)
Bilirubin, Direct: 0.2 mg/dL (ref 0.0–0.2)
Globulin: 2.5 g/dL (calc) (ref 1.9–3.7)
Indirect Bilirubin: 0.6 mg/dL (calc) (ref 0.2–1.2)
Total Bilirubin: 0.8 mg/dL (ref 0.2–1.2)
Total Protein: 7 g/dL (ref 6.1–8.1)

## 2020-02-11 LAB — PSA: PSA: 1.5 ng/mL (ref <=4.0)

## 2020-02-11 LAB — LIPID PANEL
Cholesterol: 136 mg/dL (ref <200)
HDL: 59 mg/dL (ref >=40)
LDL Cholesterol (Calc): 61 mg/dL (calc)
Non-HDL Cholesterol (Calc): 77 mg/dL (calc) (ref <130)
Total CHOL/HDL Ratio: 2.3 (calc) (ref <5.0)
Triglycerides: 84 mg/dL (ref <150)

## 2020-02-11 LAB — HEMOGLOBIN A1C
Hgb A1c MFr Bld: 6.2 % of total Hgb — ABNORMAL HIGH (ref <5.7)
Mean Plasma Glucose: 131 (calc)
eAG (mmol/L): 7.3 (calc)

## 2020-02-11 LAB — TSH: TSH: 1.73 mIU/L (ref 0.40–4.50)

## 2020-03-16 DIAGNOSIS — L905 Scar conditions and fibrosis of skin: Secondary | ICD-10-CM | POA: Diagnosis not present

## 2020-03-16 DIAGNOSIS — L570 Actinic keratosis: Secondary | ICD-10-CM | POA: Diagnosis not present

## 2020-03-16 DIAGNOSIS — L821 Other seborrheic keratosis: Secondary | ICD-10-CM | POA: Diagnosis not present

## 2020-03-16 DIAGNOSIS — D225 Melanocytic nevi of trunk: Secondary | ICD-10-CM | POA: Diagnosis not present

## 2020-03-16 DIAGNOSIS — L814 Other melanin hyperpigmentation: Secondary | ICD-10-CM | POA: Diagnosis not present

## 2020-03-21 ENCOUNTER — Encounter: Payer: Self-pay | Admitting: Family Medicine

## 2020-03-21 ENCOUNTER — Ambulatory Visit (INDEPENDENT_AMBULATORY_CARE_PROVIDER_SITE_OTHER): Payer: Medicare Other | Admitting: *Deleted

## 2020-03-21 ENCOUNTER — Other Ambulatory Visit: Payer: Self-pay

## 2020-03-21 DIAGNOSIS — Z23 Encounter for immunization: Secondary | ICD-10-CM | POA: Diagnosis not present

## 2020-03-22 DIAGNOSIS — E119 Type 2 diabetes mellitus without complications: Secondary | ICD-10-CM | POA: Diagnosis not present

## 2020-03-22 DIAGNOSIS — H401112 Primary open-angle glaucoma, right eye, moderate stage: Secondary | ICD-10-CM | POA: Diagnosis not present

## 2020-03-22 DIAGNOSIS — H52203 Unspecified astigmatism, bilateral: Secondary | ICD-10-CM | POA: Diagnosis not present

## 2020-03-22 DIAGNOSIS — H401123 Primary open-angle glaucoma, left eye, severe stage: Secondary | ICD-10-CM | POA: Diagnosis not present

## 2020-04-13 ENCOUNTER — Other Ambulatory Visit: Payer: Self-pay

## 2020-04-13 ENCOUNTER — Ambulatory Visit (INDEPENDENT_AMBULATORY_CARE_PROVIDER_SITE_OTHER): Payer: Medicare Other

## 2020-04-13 DIAGNOSIS — Z0000 Encounter for general adult medical examination without abnormal findings: Secondary | ICD-10-CM | POA: Diagnosis not present

## 2020-04-13 NOTE — Patient Instructions (Signed)
Shaun Mcmillan , Thank you for taking time to come for your Medicare Wellness Visit. I appreciate your ongoing commitment to your health goals. Please review the following plan we discussed and let me know if I can assist you in the future.   Screening recommendations/referrals: Colonoscopy: Up to date, next due 03/12/2021 Recommended yearly ophthalmology/optometry visit for glaucoma screening and checkup Recommended yearly dental visit for hygiene and checkup  Vaccinations: Influenza vaccine: Up to date, next due fall 2022 Pneumococcal vaccine: Completed series  Tdap vaccine: Up to date, next due 04/02/2021 Shingles vaccine: Currently due for Shingrix, you may receive at your local pharmacy.    Advanced directives: Please bring a copy of your medical advanced directives into the office so that we may scan into your chart.  Conditions/risks identified: None   Next appointment: None   Preventive Care 65 Years and Older, Male Preventive care refers to lifestyle choices and visits with your health care provider that can promote health and wellness. What does preventive care include?  A yearly physical exam. This is also called an annual well check.  Dental exams once or twice a year.  Routine eye exams. Ask your health care provider how often you should have your eyes checked.  Personal lifestyle choices, including:  Daily care of your teeth and gums.  Regular physical activity.  Eating a healthy diet.  Avoiding tobacco and drug use.  Limiting alcohol use.  Practicing safe sex.  Taking low doses of aspirin every day.  Taking vitamin and mineral supplements as recommended by your health care provider. What happens during an annual well check? The services and screenings done by your health care provider during your annual well check will depend on your age, overall health, lifestyle risk factors, and family history of disease. Counseling  Your health care provider may ask  you questions about your:  Alcohol use.  Tobacco use.  Drug use.  Emotional well-being.  Home and relationship well-being.  Sexual activity.  Eating habits.  History of falls.  Memory and ability to understand (cognition).  Work and work Statistician. Screening  You may have the following tests or measurements:  Height, weight, and BMI.  Blood pressure.  Lipid and cholesterol levels. These may be checked every 5 years, or more frequently if you are over 93 years old.  Skin check.  Lung cancer screening. You may have this screening every year starting at age 51 if you have a 30-pack-year history of smoking and currently smoke or have quit within the past 15 years.  Fecal occult blood test (FOBT) of the stool. You may have this test every year starting at age 6.  Flexible sigmoidoscopy or colonoscopy. You may have a sigmoidoscopy every 5 years or a colonoscopy every 10 years starting at age 50.  Prostate cancer screening. Recommendations will vary depending on your family history and other risks.  Hepatitis C blood test.  Hepatitis B blood test.  Sexually transmitted disease (STD) testing.  Diabetes screening. This is done by checking your blood sugar (glucose) after you have not eaten for a while (fasting). You may have this done every 1-3 years.  Abdominal aortic aneurysm (AAA) screening. You may need this if you are a current or former smoker.  Osteoporosis. You may be screened starting at age 56 if you are at high risk. Talk with your health care provider about your test results, treatment options, and if necessary, the need for more tests. Vaccines  Your health care provider may  recommend certain vaccines, such as:  Influenza vaccine. This is recommended every year.  Tetanus, diphtheria, and acellular pertussis (Tdap, Td) vaccine. You may need a Td booster every 10 years.  Zoster vaccine. You may need this after age 26.  Pneumococcal 13-valent conjugate  (PCV13) vaccine. One dose is recommended after age 59.  Pneumococcal polysaccharide (PPSV23) vaccine. One dose is recommended after age 61. Talk to your health care provider about which screenings and vaccines you need and how often you need them. This information is not intended to replace advice given to you by your health care provider. Make sure you discuss any questions you have with your health care provider. Document Released: 06/30/2015 Document Revised: 02/21/2016 Document Reviewed: 04/04/2015 Elsevier Interactive Patient Education  2017 [place] Prevention in the Home Falls can cause injuries. They can happen to people of all ages. There are many things you can do to make your home safe and to help prevent falls. What can I do on the outside of my home?  Regularly fix the edges of walkways and driveways and fix any cracks.  Remove anything that might make you trip as you walk through a door, such as a raised step or threshold.  Trim any bushes or trees on the path to your home.  Use bright outdoor lighting.  Clear any walking paths of anything that might make someone trip, such as rocks or tools.  Regularly check to see if handrails are loose or broken. Make sure that both sides of any steps have handrails.  Any raised decks and porches should have guardrails on the edges.  Have any leaves, snow, or ice cleared regularly.  Use sand or salt on walking paths during winter.  Clean up any spills in your garage right away. This includes oil or grease spills. What can I do in the bathroom?  Use night lights.  Install grab bars by the toilet and in the tub and shower. Do not use towel bars as grab bars.  Use non-skid mats or decals in the tub or shower.  If you need to sit down in the shower, use a plastic, non-slip stool.  Keep the floor dry. Clean up any water that spills on the floor as soon as it happens.  Remove soap buildup in the tub or shower  regularly.  Attach bath mats securely with double-sided non-slip rug tape.  Do not have throw rugs and other things on the floor that can make you trip. What can I do in the bedroom?  Use night lights.  Make sure that you have a light by your bed that is easy to reach.  Do not use any sheets or blankets that are too big for your bed. They should not hang down onto the floor.  Have a firm chair that has side arms. You can use this for support while you get dressed.  Do not have throw rugs and other things on the floor that can make you trip. What can I do in the kitchen?  Clean up any spills right away.  Avoid walking on wet floors.  Keep items that you use a lot in easy-to-reach places.  If you need to reach something above you, use a strong step stool that has a grab bar.  Keep electrical cords out of the way.  Do not use floor polish or wax that makes floors slippery. If you must use wax, use non-skid floor wax.  Do not have throw rugs and  other things on the floor that can make you trip. What can I do with my stairs?  Do not leave any items on the stairs.  Make sure that there are handrails on both sides of the stairs and use them. Fix handrails that are broken or loose. Make sure that handrails are as long as the stairways.  Check any carpeting to make sure that it is firmly attached to the stairs. Fix any carpet that is loose or worn.  Avoid having throw rugs at the top or bottom of the stairs. If you do have throw rugs, attach them to the floor with carpet tape.  Make sure that you have a light switch at the top of the stairs and the bottom of the stairs. If you do not have them, ask someone to add them for you. What else can I do to help prevent falls?  Wear shoes that:  Do not have high heels.  Have rubber bottoms.  Are comfortable and fit you well.  Are closed at the toe. Do not wear sandals.  If you use a stepladder:  Make sure that it is fully  opened. Do not climb a closed stepladder.  Make sure that both sides of the stepladder are locked into place.  Ask someone to hold it for you, if possible.  Clearly mark and make sure that you can see:  Any grab bars or handrails.  First and last steps.  Where the edge of each step is.  Use tools that help you move around (mobility aids) if they are needed. These include:  Canes.  Walkers.  Scooters.  Crutches.  Turn on the lights when you go into a dark area. Replace any light bulbs as soon as they burn out.  Set up your furniture so you have a clear path. Avoid moving your furniture around.  If any of your floors are uneven, fix them.  If there are any pets around you, be aware of where they are.  Review your medicines with your doctor. Some medicines can make you feel dizzy. This can increase your chance of falling. Ask your doctor what other things that you can do to help prevent falls. This information is not intended to replace advice given to you by your health care provider. Make sure you discuss any questions you have with your health care provider. Document Released: 03/30/2009 Document Revised: 11/09/2015 Document Reviewed: 07/08/2014 Elsevier Interactive Patient Education  2017 [employer].

## 2020-04-13 NOTE — Progress Notes (Signed)
Subjective:   Shaun Mcmillan is a 79 y.o. male who presents for an Initial Medicare Annual Wellness Visit.  I connected with Shaun Mcmillan today by telephone and verified that I am speaking with the correct person using two identifiers. Location patient: home Location provider: work Persons participating in the virtual visit: patient, provider.   I discussed the limitations, risks, security and privacy concerns of performing an evaluation and management service by telephone and the availability of in person appointments. I also discussed with the patient that there may be a patient responsible charge related to this service. The patient expressed understanding and verbally consented to this telephonic visit.    Interactive audio and video telecommunications were attempted between this provider and patient, however failed, due to patient having technical difficulties OR patient did not have access to video capability.  We continued and completed visit with audio only.      Review of Systems    N/A  Cardiac Risk Factors include: advanced age (>74men, >71 women);diabetes mellitus;male gender;dyslipidemia;hypertension     Objective:    Today's Vitals   There is no height or weight on file to calculate BMI.  Advanced Directives 04/13/2020  Does Patient Have a Medical Advance Directive? Yes  Type of Paramedic of [place];Living will  Does patient want to make changes to medical advance directive? No - Patient declined  Copy of [place] in Chart? No - copy requested    Current Medications (verified) Outpatient Encounter Medications as of 04/13/2020  Medication Sig  . amLODipine (NORVASC) 10 MG tablet Take 1 tablet (10 mg total) by mouth daily.  Marland Kitchen aspirin 325 MG tablet Take 325 mg by mouth daily.    Marland Kitchen atorvastatin (LIPITOR) 40 MG tablet Take 1 tablet (40 mg total) by mouth daily.  . Cholecalciferol (VITAMIN D PO) Take 2,000 Units  by mouth daily.  Marland Kitchen latanoprost (XALATAN) 0.005 % ophthalmic solution Place 1 drop into both eyes at bedtime.  Marland Kitchen LORazepam (ATIVAN) 1 MG tablet Take 1 tablet (1 mg total) by mouth 3 (three) times daily as needed for anxiety.  . Melatonin 5 MG TABS Take 5 mg by mouth at bedtime.  . metFORMIN (GLUCOPHAGE) 500 MG tablet TAKE 1 TABLET BY MOUTH  TWICE DAILY WITH A MEAL  . Multiple Vitamin (MULTIVITAMIN) tablet Take 1 tablet by mouth daily.    . ramipril (ALTACE) 10 MG capsule TAKE 1 CAPSULE BY MOUTH  TWICE DAILY  . timolol (BETIMOL) 0.5 % ophthalmic solution Place 1 drop into both eyes daily.  . timolol (TIMOPTIC) 0.5 % ophthalmic solution PUT 1 DROP IN BOTH EYES ONCE A DAY   No facility-administered encounter medications on file as of 04/13/2020.    Allergies (verified) Patient has no known allergies.   History: Past Medical History:  Diagnosis Date  . Diabetes mellitus type II   . Glaucoma    sees Dr. Ellie Lunch   . Hx of colonic polyps   . Hyperlipidemia   . Hypertension   . Migraines   . Nephrolithiasis   . Shingles 04/2010   left leg  . 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 APPENDECTOMY    . colonscopy  03/12/2016   per Dr. Watt Climes, benign polyps, repeat in 5 yrs  . left craniotomy  11/03/08   per Dr. Sherley Bounds for a subdrual hematoma  . surgery for ocmpound fracture  right leg   Family History  Problem Relation Age of Onset  . Heart disease Other    Social History   Socioeconomic History  . Marital status: Married    Spouse name: Not on file  . Number of children: Not on file  . Years of education: Not on file  . Highest education level: Not on file  Occupational History  . Not on file  Tobacco Use  . Smoking status: Never Smoker  . Smokeless tobacco: Never Used  Substance and Sexual Activity  . Alcohol use: Yes    Alcohol/week: 1.0 standard drink    Types: 1 Standard  drinks or equivalent per week  . Drug use: No  . Sexual activity: Not on file  Other Topics Concern  . Not on file  Social History Narrative  . Not on file   Social Determinants of Health   Financial Resource Strain: Low Risk   . Difficulty of Paying Living Expenses: Not hard at all  Food Insecurity: No Food Insecurity  . Worried About Charity fundraiser in the Last Year: Never true  . Ran Out of Food in the Last Year: Never true  Transportation Needs: No Transportation Needs  . Lack of Transportation (Medical): No  . Lack of Transportation (Non-Medical): No  Physical Activity: Sufficiently Active  . Days of Exercise per Week: 4 days  . Minutes of Exercise per Session: 40 min  Stress: No Stress Concern Present  . Feeling of Stress : Not at all  Social Connections: Moderately Integrated  . Frequency of Communication with Friends and Family: Not on file  . Frequency of Social Gatherings with Friends and Family: Three times a week  . Attends Religious Services: More than 4 times per year  . Active Member of Clubs or Organizations: No  . Attends Archivist Meetings: Never  . Marital Status: Married    Tobacco Counseling Counseling given: Not Answered   Clinical Intake:  Pre-visit preparation completed: Yes  Pain : No/denies pain     Nutritional Risks: None Diabetes: Yes CBG done?: No Did pt. bring in CBG monitor from home?: No  How often do you need to have someone help you when you read instructions, pamphlets, or other written materials from your doctor or pharmacy?: 1 - Never What is the last grade level you completed in school?: College  Diabetic?Yes Nutrition Risk Assessment:  Has the patient had any N/V/D within the last 2 months?  No  Does the patient have any non-healing wounds?  No  Has the patient had any unintentional weight loss or weight gain?  No   Diabetes:  Is the patient diabetic?  Yes  If diabetic, was a CBG obtained today?  No    Did the patient bring in their glucometer from home?  No  How often do you monitor your CBG's? Patient does not check glucose at home, just gets A1c in office every 6 months .   Financial Strains and Diabetes Management:  Are you having any financial strains with the device, your supplies or your medication? No .  Does the patient want to be seen by Chronic Care Management for management of their diabetes?  No  Would the patient like to be referred to a Nutritionist or for Diabetic Management?  No   Diabetic Exams:  Diabetic Eye Exam: Patient had eye exam on october, results pending. Diabetic Foot Exam: Overdue, Pt has been advised about the importance in completing this exam. Pt  is scheduled for diabetic foot exam on in 6 months .  Interpreter Needed?: No  Information entered by :: [place] of Daily Living In your present state of health, do you have any difficulty performing the following activities: 04/13/2020  Hearing? N  Vision? N  Difficulty concentrating or making decisions? N  Walking or climbing stairs? N  Dressing or bathing? N  Doing errands, shopping? N  Preparing Food and eating ? N  Using the Toilet? N  In the past six months, have you accidently leaked urine? N  Do you have problems with loss of bowel control? N  Managing your Medications? N  Managing your Finances? N  Housekeeping or managing your Housekeeping? N  Some recent data might be hidden    Patient Care Team: [name], MD as PCP - General  Indicate any recent Medical Services you may have received from other than [facility] providers in the past year (date may be approximate).     Assessment:   This is a routine wellness examination for Shaun Mcmillan.  Hearing/Vision screen  Hearing Screening   125Hz  250Hz  500Hz  1000Hz  2000Hz  3000Hz  4000Hz  6000Hz  8000Hz   Right ear:           Left ear:           Vision Screening Comments: Patient states sees eye doctor every 6 months.  Dietary  issues and exercise activities discussed: Current Exercise Habits: Home exercise routine, Type of exercise: walking, Time (Minutes): 35, Frequency (Times/Week): 4, Weekly Exercise (Minutes/Week): 140, Intensity: Mild, Exercise limited by: None identified  Goals    . Patient Stated     I will continue to walk 2-4 miles 3x per week      Depression Screen PHQ 2/9 Scores 04/13/2020 12/22/2018 09/11/2016 09/05/2015 09/01/2014  PHQ - 2 Score 0 0 0 0 0  PHQ- 9 Score 0 - - - -    Fall Risk Fall Risk  04/13/2020 12/22/2018 09/11/2016 09/05/2015 09/01/2014  Falls in the past year? 0 0 No No No  Number falls in past yr: 0 - - - -  Injury with Fall? 0 - - - -  Risk for fall due to : No Fall Risks - - - -  Follow up Falls evaluation completed;Falls prevention discussed - - - -    Any stairs in or around the home? No  If so, are there any without handrails? No  Home free of loose throw rugs in walkways, pet beds, electrical cords, etc? Yes  Adequate lighting in your home to reduce risk of falls? Yes   ASSISTIVE DEVICES UTILIZED TO PREVENT FALLS:  Life alert? No  Use of a cane, walker or w/c? No  Grab bars in the bathroom? No  Shower chair or bench in shower? No  Elevated toilet seat or a handicapped toilet? No     Cognitive Function:  Cognitive screening not indicated based on direct observation       Immunizations Immunization History  Administered Date(s) Administered  . Fluad Quad(high Dose 65+) 03/02/2019, 03/21/2020  . Influenza Split 04/03/2011, 03/20/2012  . Influenza Whole 03/31/2007, 03/18/2008, 03/09/2010  . Influenza, High Dose Seasonal PF 03/07/2015, 03/15/2016, 03/21/2017, 03/24/2018  . Influenza,inj,Quad PF,6+ Mos 03/09/2013, 03/11/2014  . PFIZER SARS-COV-2 Vaccination 07/06/2019, 07/27/2019  . Pneumococcal Conjugate-13 06/25/2013  . Pneumococcal Polysaccharide-23 09/05/2015  . Tdap 04/03/2011    TDAP status: Up to date Flu Vaccine status: Up to date Pneumococcal  vaccine status: Up to date Covid-19 vaccine  status: Completed vaccines  Qualifies for Shingles Vaccine? Yes   Zostavax completed No   Shingrix Completed?: No.    Education has been provided regarding the importance of this vaccine. Patient has been advised to call insurance company to determine out of pocket expense if they have not yet received this vaccine. Advised may also receive vaccine at local pharmacy or Health Dept. Verbalized acceptance and understanding.  Screening Tests Health Maintenance  Topic Date Due  . Hepatitis C Screening  Never done  . FOOT EXAM  Never done  . OPHTHALMOLOGY EXAM  01/30/2017  . HEMOGLOBIN A1C  08/12/2020  . TETANUS/TDAP  04/02/2021  . INFLUENZA VACCINE  Completed  . COVID-19 Vaccine  Completed  . PNA vac Low Risk Adult  Completed    Health Maintenance  Health Maintenance Due  Topic Date Due  . Hepatitis C Screening  Never done  . FOOT EXAM  Never done  . OPHTHALMOLOGY EXAM  01/30/2017    Colorectal cancer screening: Completed 03/12/2016. Repeat every 5 years  Lung Cancer Screening: (Low Dose CT Chest recommended if Age 65-80 years, 30 pack-year currently smoking OR have quit w/in 15years.) does not qualify.   Lung Cancer Screening Referral: N/A   Additional Screening:  Hepatitis C Screening: does not qualify;   Vision Screening: Recommended annual ophthalmology exams for early detection of glaucoma and other disorders of the eye. Is the patient up to date with their annual eye exam?  Yes  Who is the provider or what is the name of the office in which the patient attends annual eye exams? Dr.McCuen  If pt is not established with a provider, would they like to be referred to a provider to establish care? No .   Dental Screening: Recommended annual dental exams for proper oral hygiene  Community Resource Referral / Chronic Care Management: CRR required this visit?  No   CCM required this visit?  No      Plan:     I have  personally reviewed and noted the following in the patient's chart:   . Medical and social history . Use of alcohol, tobacco or illicit drugs  . Current medications and supplements . Functional ability and status . Nutritional status . Physical activity . Advanced directives . List of other physicians . Hospitalizations, surgeries, and ER visits in previous 12 months . Vitals . Screenings to include cognitive, depression, and falls . Referrals and appointments  In addition, I have reviewed and discussed with patient certain preventive protocols, quality metrics, and best practice recommendations. A written personalized care plan for preventive services as well as general preventive health recommendations were provided to patient.     Ofilia Neas, LPN   [id number]   Nurse Notes: None

## 2020-05-25 DIAGNOSIS — C4441 Basal cell carcinoma of skin of scalp and neck: Secondary | ICD-10-CM | POA: Diagnosis not present

## 2020-06-29 DIAGNOSIS — L821 Other seborrheic keratosis: Secondary | ICD-10-CM | POA: Diagnosis not present

## 2020-06-29 DIAGNOSIS — L918 Other hypertrophic disorders of the skin: Secondary | ICD-10-CM | POA: Diagnosis not present

## 2020-06-29 DIAGNOSIS — L570 Actinic keratosis: Secondary | ICD-10-CM | POA: Diagnosis not present

## 2020-06-29 DIAGNOSIS — L905 Scar conditions and fibrosis of skin: Secondary | ICD-10-CM | POA: Diagnosis not present

## 2020-06-29 DIAGNOSIS — Z872 Personal history of diseases of the skin and subcutaneous tissue: Secondary | ICD-10-CM | POA: Diagnosis not present

## 2020-06-29 DIAGNOSIS — Z85828 Personal history of other malignant neoplasm of skin: Secondary | ICD-10-CM | POA: Diagnosis not present

## 2020-06-29 DIAGNOSIS — D225 Melanocytic nevi of trunk: Secondary | ICD-10-CM | POA: Diagnosis not present

## 2020-06-29 DIAGNOSIS — L820 Inflamed seborrheic keratosis: Secondary | ICD-10-CM | POA: Diagnosis not present

## 2020-07-05 ENCOUNTER — Other Ambulatory Visit: Payer: Self-pay

## 2020-07-05 ENCOUNTER — Ambulatory Visit (INDEPENDENT_AMBULATORY_CARE_PROVIDER_SITE_OTHER): Payer: Medicare Other | Admitting: Family Medicine

## 2020-07-05 ENCOUNTER — Encounter: Payer: Self-pay | Admitting: Family Medicine

## 2020-07-05 VITALS — BP 118/70 | HR 64 | Ht 69.0 in | Wt 159.0 lb

## 2020-07-05 DIAGNOSIS — E118 Type 2 diabetes mellitus with unspecified complications: Secondary | ICD-10-CM

## 2020-07-05 DIAGNOSIS — M26652 Arthropathy of left temporomandibular joint: Secondary | ICD-10-CM

## 2020-07-05 MED ORDER — ASPIRIN EC 81 MG PO TBEC: 81.0000 mg | DELAYED_RELEASE_TABLET | Freq: Every day | ORAL | 11 refills | Status: AC

## 2020-07-05 MED ORDER — DICLOFENAC SODIUM 75 MG PO TBEC: 75.0000 mg | DELAYED_RELEASE_TABLET | Freq: Two times a day (BID) | ORAL | 0 refills | Status: DC

## 2020-07-05 NOTE — Progress Notes (Signed)
   Subjective:    Patient ID: Shaun Mcmillan, male    DOB: 1940/10/25, 80 y.o.   MRN: [id number]  HPI Here for 6 weeks of pain and swelling in the left jaw just in front of the ear. No fever. He does not feel ill. No ST or ear pain.    Review of Systems  Constitutional: Negative.   HENT: Positive for facial swelling. Negative for congestion, ear pain, postnasal drip, sinus pressure, sore throat and trouble swallowing.   Eyes: Negative.   Respiratory: Negative.        Objective:   Physical Exam Constitutional:      Appearance: Normal appearance.  HENT:     Right Ear: Tympanic membrane, ear canal and external ear normal.     Left Ear: Tympanic membrane, ear canal and external ear normal.     Nose: Nose normal.     Mouth/Throat:     Pharynx: Oropharynx is clear.     Comments: There is some swelling over the left TMJ, and this is quite tender  Eyes:     Conjunctiva/sclera: Conjunctivae normal.  Pulmonary:     Effort: Pulmonary effort is normal.     Breath sounds: Normal breath sounds.  Lymphadenopathy:     Cervical: No cervical adenopathy.  Neurological:     Mental Status: He is alert.           Assessment & Plan:  TMJ pain, try Diclofenac 75 mg BID. Avoid opening the mouth wide or chewing hard foods. He will see his dentist soon.  Alysia Penna, MD

## 2020-08-15 ENCOUNTER — Other Ambulatory Visit: Payer: Self-pay

## 2020-08-15 ENCOUNTER — Other Ambulatory Visit (INDEPENDENT_AMBULATORY_CARE_PROVIDER_SITE_OTHER): Payer: Medicare Other

## 2020-08-15 DIAGNOSIS — E118 Type 2 diabetes mellitus with unspecified complications: Secondary | ICD-10-CM

## 2020-08-15 LAB — HEMOGLOBIN A1C: Hgb A1c MFr Bld: 5.8 % (ref 4.6–6.5)

## 2020-08-17 DIAGNOSIS — M1711 Unilateral primary osteoarthritis, right knee: Secondary | ICD-10-CM | POA: Diagnosis not present

## 2020-08-24 DIAGNOSIS — M170 Bilateral primary osteoarthritis of knee: Secondary | ICD-10-CM | POA: Diagnosis not present

## 2020-08-24 DIAGNOSIS — M26609 Unspecified temporomandibular joint disorder, unspecified side: Secondary | ICD-10-CM | POA: Diagnosis not present

## 2020-08-25 ENCOUNTER — Other Ambulatory Visit: Payer: Self-pay

## 2020-08-25 ENCOUNTER — Ambulatory Visit (INDEPENDENT_AMBULATORY_CARE_PROVIDER_SITE_OTHER): Payer: Medicare Other | Admitting: Family Medicine

## 2020-08-25 ENCOUNTER — Encounter: Payer: Self-pay | Admitting: Family Medicine

## 2020-08-25 VITALS — BP 110/68 | HR 68 | Temp 97.7°F | Wt 159.0 lb

## 2020-08-25 DIAGNOSIS — R220 Localized swelling, mass and lump, head: Secondary | ICD-10-CM

## 2020-08-25 NOTE — Progress Notes (Signed)
   Subjective:    Patient ID: Shaun Mcmillan, male    DOB: Oct 11, 1940, 80 y.o.   MRN: [id number]  HPI Here to follow up a painful mass over the left jaw that appeared about one month ago. He saw [place] at first when he had pain but no mass was apparent, and we felt he had TMJ. However the area has grown larger since then. He has not trouble chewing. He saw Dr. Sherlyn Lees at [facility] yesterday, and he said that this was definitely not a TMJ issue. He felt he possibly had an infection of some sort and he sent Ron back to [place].    Review of Systems  Constitutional: Negative.   HENT: Positive for facial swelling.   Eyes: Negative.   Respiratory: Negative.   Cardiovascular: Negative.        Objective:   Physical Exam Constitutional:      Appearance: Normal appearance.  HENT:     Right Ear: Tympanic membrane, ear canal and external ear normal.     Left Ear: Tympanic membrane, ear canal and external ear normal.     Nose: Nose normal.     Mouth/Throat:     Pharynx: Oropharynx is clear.     Comments: There is a 1 cm smooth round well defined tender mass sitting over the left jaw near the TMJ. This is larger than it was when we saw him before. The ear is normal. The jaw has full ROM Eyes:     Conjunctiva/sclera: Conjunctivae normal.  Lymphadenopathy:     Cervical: No cervical adenopathy.  Neurological:     Mental Status: He is alert.           Assessment & Plan:  Tender mass over the left jaw area. This is not TMJ. The mass seems likely to be a cyst or a lymph node by exam. We will refer him to ENT to evaluate further.  Alysia Penna, MD

## 2020-08-30 ENCOUNTER — Telehealth: Payer: Self-pay | Admitting: Family Medicine

## 2020-08-30 NOTE — Telephone Encounter (Signed)
Patient is calling to advise that his condition is worsening and the cyst on his face is affecting his ear. He states he has a lot of pain.  He does have an appointment with ENT on 03/24 which is the soonest. He states he needs to get in sooner but I advised that we do not have control of their schedule.  I advised to go to the ER if his condition has worsened. He would like to know if there is anything that Dr. Sarajane Jews can do for relief until then.  Please advise.

## 2020-09-01 MED ORDER — TRAMADOL HCL 50 MG PO TABS: 100.0000 mg | ORAL_TABLET | Freq: Four times a day (QID) | ORAL | 1 refills | Status: DC | PRN

## 2020-09-01 NOTE — Telephone Encounter (Signed)
I sent in Tramadol that he can use for pain if needed

## 2020-09-01 NOTE — Telephone Encounter (Signed)
Spoke with pt advised that Dr Sarajane Jews sent Tramadol to his pharmacy for pain, pt verbalized uderstanding

## 2020-09-04 DIAGNOSIS — M2021 Hallux rigidus, right foot: Secondary | ICD-10-CM | POA: Diagnosis not present

## 2020-09-04 DIAGNOSIS — M205X2 Other deformities of toe(s) (acquired), left foot: Secondary | ICD-10-CM | POA: Diagnosis not present

## 2020-09-04 DIAGNOSIS — M21961 Unspecified acquired deformity of right lower leg: Secondary | ICD-10-CM | POA: Diagnosis not present

## 2020-09-04 DIAGNOSIS — M21962 Unspecified acquired deformity of left lower leg: Secondary | ICD-10-CM | POA: Diagnosis not present

## 2020-09-07 DIAGNOSIS — K118 Other diseases of salivary glands: Secondary | ICD-10-CM | POA: Diagnosis not present

## 2020-09-08 ENCOUNTER — Other Ambulatory Visit: Payer: Self-pay | Admitting: Otolaryngology

## 2020-09-08 DIAGNOSIS — K118 Other diseases of salivary glands: Secondary | ICD-10-CM

## 2020-09-12 ENCOUNTER — Ambulatory Visit
Admission: RE | Admit: 2020-09-12 | Discharge: 2020-09-12 | Disposition: A | Discharge status: Home / Self Care | Payer: Medicare Other | Source: Ambulatory Visit | Attending: Otolaryngology | Admitting: Otolaryngology

## 2020-09-12 DIAGNOSIS — K118 Other diseases of salivary glands: Secondary | ICD-10-CM | POA: Diagnosis not present

## 2020-09-12 MED ORDER — IOPAMIDOL (ISOVUE-300) INJECTION 61%
75.0000 mL | Freq: Once | INTRAVENOUS | Status: AC | PRN
  Administered 2020-09-12: 75 mL via INTRAVENOUS

## 2020-09-13 ENCOUNTER — Encounter: Payer: Self-pay | Admitting: Family Medicine

## 2020-09-15 NOTE — H&P (Signed)
HPI:   Shaun Mcmillan is a 80 y.o. male who presents as a consult Patient.   Referring Provider: Jacquenette Shone, MD  Chief complaint: Facial mass.  HPI: He had a tenderness and swelling in front of the left ear for several weeks. It has been getting a little bit worse. He has a history of skin cancers but he cannot recall any specific sites in the head and neck area. Otherwise in pretty good health.  PMH/Meds/All/SocHx/FamHx/ROS:   Past Medical History:  Diagnosis Date  . Diabetes mellitus ([place])   Past Surgical History:  Procedure Laterality Date  . APPENDECTOMY  . TIBIA FRACTURE SURGERY 1958   No family history of bleeding disorders, wound healing problems or difficulty with anesthesia.   Social History   Socioeconomic History  . Marital status: Married  Spouse name: Not on file  . Number of children: Not on file  . Years of education: Not on file  . Highest education level: Not on file  Occupational History  . Not on file  Tobacco Use  . Smoking status: Never Smoker  . Smokeless tobacco: Never Used  Vaping Use  . Vaping Use: Never used  Substance and Sexual Activity  . Alcohol use: Not on file  . Drug use: Not on file  . Sexual activity: Not on file  Other Topics Concern  . Not on file  Social History Narrative  . Not on file   Social Determinants of Health   Financial Resource Strain: Not on file  Food Insecurity: Not on file  Transportation Needs: Not on file  Physical Activity: Not on file  Stress: Not on file  Social Connections: Not on file  Housing Stability: Not on file   Current Outpatient Medications:  . amLODIPine (NORVASC) 10 MG tablet, , Disp: , Rfl:  . aspirin 81 MG EC tablet *ANTIPLATELET*, Take 81 mg by mouth., Disp: , Rfl:  . atorvastatin (LIPITOR) 40 MG tablet, , Disp: , Rfl:  . cholecalciferol, vitamin D3, (CHOLECALCIFEROL, VIT D3,,BULK,) 100,000 unit/gram Powd, Take 2,000 Units by mouth daily., Disp: , Rfl:  . latanoprost  (XALATAN) 0.005 % ophthalmic solution, Apply 1 drop to eye nightly., Disp: , Rfl:  . LORazepam (ATIVAN) 1 MG tablet, Take 1 mg by mouth., Disp: , Rfl:  . melatonin 5 mg Tab tablet, Take 5 mg by mouth., Disp: , Rfl:  . metFORMIN (GLUCOPHAGE) 500 MG tablet, , Disp: , Rfl:  . multivitamin (TAB-A-VITE) tablet, Take 1 tablet by mouth daily., Disp: , Rfl:  . ramipriL (ALTACE) 10 MG capsule, , Disp: , Rfl:  . timolol (TIMOPTIC) 0.5 % ophthalmic solution, PUT 1 DROP IN BOTH EYES ONCE A DAY, Disp: , Rfl:   A complete ROS was performed with pertinent positives/negatives noted in the HPI. The remainder of the ROS are negative.   Physical Exam:   BP 153/70  Pulse 71  Temp (!) 96.1 F (35.6 C)  Ht 1.778 m (5\' 10" )  Wt 71.7 kg (158 lb)  BMI 22.67 kg/m   General: Healthy and alert, in no distress, breathing easily. Normal affect. In a pleasant mood. Head: Normocephalic, atraumatic. No masses, or scars. Eyes: Pupils are equal, and reactive to light. Vision is grossly intact. No spontaneous or gaze nystagmus. Ears: Ear canals are clear. Tympanic membranes are intact, with normal landmarks and the middle ears are clear and healthy. Hearing: Grossly normal. Nose: Nasal cavities are clear with healthy mucosa, no polyps or exudate. Airways are patent. Face: 2 cm firm  swelling immediately in the subcutaneous tissue with some dermal involvement just in front of the left ear, within the parotid, facial nerve function is symmetric. Oral Cavity: No mucosal abnormalities are noted. Tongue with normal mobility. Dentition appears healthy. Oropharynx: Tonsils are symmetric. There are no mucosal masses identified. Tongue base appears normal and healthy. Larynx/Hypopharynx: deferred Chest: Deferred Neck: No palpable masses, no cervical adenopathy, no thyroid nodules or enlargement. Neuro: Cranial nerves II-XII with normal function. Balance: Normal gate. Other findings: none.  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 Recommend CT of the area first followed by a fine-needle aspiration biopsy here in the office. We will get this scheduled as quickly as possible.

## 2020-09-18 ENCOUNTER — Other Ambulatory Visit (HOSPITAL_COMMUNITY)
Admission: RE | Admit: 2020-09-18 | Discharge: 2020-09-18 | Disposition: A | Discharge status: Home / Self Care | Payer: Medicare Other | Source: Ambulatory Visit | Attending: Otolaryngology | Admitting: Otolaryngology

## 2020-09-18 DIAGNOSIS — K118 Other diseases of salivary glands: Secondary | ICD-10-CM | POA: Diagnosis not present

## 2020-09-18 DIAGNOSIS — E785 Hyperlipidemia, unspecified: Secondary | ICD-10-CM | POA: Diagnosis not present

## 2020-09-18 DIAGNOSIS — E559 Vitamin D deficiency, unspecified: Secondary | ICD-10-CM | POA: Diagnosis not present

## 2020-09-18 DIAGNOSIS — Z85828 Personal history of other malignant neoplasm of skin: Secondary | ICD-10-CM | POA: Diagnosis not present

## 2020-09-18 DIAGNOSIS — E119 Type 2 diabetes mellitus without complications: Secondary | ICD-10-CM | POA: Diagnosis not present

## 2020-09-18 DIAGNOSIS — Z01812 Encounter for preprocedural laboratory examination: Secondary | ICD-10-CM | POA: Insufficient documentation

## 2020-09-18 DIAGNOSIS — M1990 Unspecified osteoarthritis, unspecified site: Secondary | ICD-10-CM | POA: Diagnosis not present

## 2020-09-18 DIAGNOSIS — C7989 Secondary malignant neoplasm of other specified sites: Secondary | ICD-10-CM | POA: Diagnosis not present

## 2020-09-18 DIAGNOSIS — C07 Malignant neoplasm of parotid gland: Secondary | ICD-10-CM | POA: Diagnosis not present

## 2020-09-18 DIAGNOSIS — Z20822 Contact with and (suspected) exposure to covid-19: Secondary | ICD-10-CM | POA: Insufficient documentation

## 2020-09-18 DIAGNOSIS — I10 Essential (primary) hypertension: Secondary | ICD-10-CM | POA: Diagnosis not present

## 2020-09-18 DIAGNOSIS — K1120 Sialoadenitis, unspecified: Secondary | ICD-10-CM | POA: Diagnosis not present

## 2020-09-18 DIAGNOSIS — Z7984 Long term (current) use of oral hypoglycemic drugs: Secondary | ICD-10-CM | POA: Diagnosis not present

## 2020-09-18 LAB — SARS CORONAVIRUS 2 (TAT 6-24 HRS): SARS Coronavirus 2: NEGATIVE

## 2020-09-19 ENCOUNTER — Encounter (HOSPITAL_COMMUNITY): Payer: Self-pay | Admitting: Otolaryngology

## 2020-09-19 ENCOUNTER — Other Ambulatory Visit: Payer: Self-pay

## 2020-09-19 NOTE — Progress Notes (Signed)
[name] denies chest pain or shortness of breath. Patient was tested for Covid and has been in quarantine since that time.  [name] has type II diabetes, last A1C on 08/15/20 was 5.8.  I instructed patient to not take Metformin in am.  [name] does not check CBG.

## 2020-09-20 ENCOUNTER — Encounter (HOSPITAL_COMMUNITY)
Admission: RE | Disposition: A | Discharge status: Home / Self Care | Payer: Self-pay | Source: Ambulatory Visit | Attending: Otolaryngology

## 2020-09-20 ENCOUNTER — Inpatient Hospital Stay (HOSPITAL_COMMUNITY)
Admission: RE | Admit: 2020-09-20 | Discharge: 2020-09-22 | DRG: 830 | Disposition: A | Discharge status: Home / Self Care | Payer: Medicare Other | Source: Ambulatory Visit | Attending: Otolaryngology | Admitting: Otolaryngology

## 2020-09-20 ENCOUNTER — Inpatient Hospital Stay (HOSPITAL_COMMUNITY): Payer: Medicare Other | Admitting: Certified Registered Nurse Anesthetist

## 2020-09-20 ENCOUNTER — Encounter (HOSPITAL_COMMUNITY): Payer: Self-pay | Admitting: Otolaryngology

## 2020-09-20 DIAGNOSIS — M1990 Unspecified osteoarthritis, unspecified site: Secondary | ICD-10-CM | POA: Diagnosis present

## 2020-09-20 DIAGNOSIS — I10 Essential (primary) hypertension: Secondary | ICD-10-CM | POA: Diagnosis present

## 2020-09-20 DIAGNOSIS — Z85828 Personal history of other malignant neoplasm of skin: Secondary | ICD-10-CM | POA: Diagnosis not present

## 2020-09-20 DIAGNOSIS — E119 Type 2 diabetes mellitus without complications: Secondary | ICD-10-CM | POA: Diagnosis not present

## 2020-09-20 DIAGNOSIS — IMO0002 Reserved for concepts with insufficient information to code with codable children: Secondary | ICD-10-CM | POA: Diagnosis present

## 2020-09-20 DIAGNOSIS — C7989 Secondary malignant neoplasm of other specified sites: Principal | ICD-10-CM | POA: Diagnosis present

## 2020-09-20 DIAGNOSIS — Z20822 Contact with and (suspected) exposure to covid-19: Secondary | ICD-10-CM | POA: Diagnosis present

## 2020-09-20 DIAGNOSIS — K118 Other diseases of salivary glands: Secondary | ICD-10-CM | POA: Diagnosis not present

## 2020-09-20 DIAGNOSIS — C799 Secondary malignant neoplasm of unspecified site: Secondary | ICD-10-CM | POA: Diagnosis present

## 2020-09-20 DIAGNOSIS — Z7984 Long term (current) use of oral hypoglycemic drugs: Secondary | ICD-10-CM | POA: Diagnosis not present

## 2020-09-20 HISTORY — DX: Personal history of urinary calculi: Z87.442

## 2020-09-20 HISTORY — DX: Unspecified osteoarthritis, unspecified site: M19.90

## 2020-09-20 HISTORY — PX: PAROTIDECTOMY: SHX2163

## 2020-09-20 LAB — POCT I-STAT, CHEM 8
BUN: 13 mg/dL (ref 8–23)
Calcium, Ion: 1.2 mmol/L (ref 1.15–1.40)
Chloride: 106 mmol/L (ref 98–111)
Creatinine, Ser: 0.8 mg/dL (ref 0.61–1.24)
Glucose, Bld: 141 mg/dL — ABNORMAL HIGH (ref 70–99)
HCT: 42 % (ref 39.0–52.0)
Hemoglobin: 14.3 g/dL (ref 13.0–17.0)
Potassium: 3.6 mmol/L (ref 3.5–5.1)
Sodium: 143 mmol/L (ref 135–145)
TCO2: 27 mmol/L (ref 22–32)

## 2020-09-20 LAB — GLUCOSE, CAPILLARY
Glucose-Capillary: 144 mg/dL — ABNORMAL HIGH (ref 70–99)
Glucose-Capillary: 139 mg/dL — ABNORMAL HIGH (ref 70–99)
Glucose-Capillary: 198 mg/dL — ABNORMAL HIGH (ref 70–99)

## 2020-09-20 SURGERY — EXCISION, PAROTID GLAND
Anesthesia: General | Site: Face | Laterality: Left

## 2020-09-20 MED ORDER — POTASSIUM CHLORIDE 2 MEQ/ML IV SOLN
INTRAVENOUS | Status: DC
  Filled 2020-09-20: qty 1000

## 2020-09-20 MED ORDER — BACITRACIN ZINC 500 UNIT/GM EX OINT
TOPICAL_OINTMENT | CUTANEOUS | Status: DC | PRN
  Administered 2020-09-20: 1 via TOPICAL

## 2020-09-20 MED ORDER — METFORMIN HCL 500 MG PO TABS
500.0000 mg | ORAL_TABLET | Freq: Two times a day (BID) | ORAL | Status: DC
  Administered 2020-09-21 – 2020-09-22 (×3): 500 mg via ORAL
  Filled 2020-09-20 (×3): qty 1

## 2020-09-20 MED ORDER — CEFAZOLIN SODIUM-DEXTROSE 2-3 GM-%(50ML) IV SOLR
INTRAVENOUS | Status: DC | PRN
  Administered 2020-09-20: 2 g via INTRAVENOUS

## 2020-09-20 MED ORDER — AMLODIPINE BESYLATE 10 MG PO TABS
10.0000 mg | ORAL_TABLET | Freq: Every day | ORAL | Status: DC
  Administered 2020-09-21 – 2020-09-22 (×2): 10 mg via ORAL
  Filled 2020-09-20 (×2): qty 1

## 2020-09-20 MED ORDER — DEXAMETHASONE SODIUM PHOSPHATE 10 MG/ML IJ SOLN
INTRAMUSCULAR | Status: DC | PRN
  Administered 2020-09-20: 5 mg via INTRAVENOUS

## 2020-09-20 MED ORDER — PROPOFOL 10 MG/ML IV BOLUS
INTRAVENOUS | Status: DC | PRN
  Administered 2020-09-20: 130 mg via INTRAVENOUS
  Administered 2020-09-20: 20 mg via INTRAVENOUS
  Administered 2020-09-20: 30 mg via INTRAVENOUS
  Administered 2020-09-20: 20 mg via INTRAVENOUS
  Administered 2020-09-20: 30 mg via INTRAVENOUS

## 2020-09-20 MED ORDER — MELATONIN 5 MG PO TABS
5.0000 mg | ORAL_TABLET | Freq: Every day | ORAL | Status: DC
  Administered 2020-09-20 – 2020-09-21 (×2): 5 mg via ORAL
  Filled 2020-09-20 (×2): qty 1

## 2020-09-20 MED ORDER — RAMIPRIL 10 MG PO CAPS
10.0000 mg | ORAL_CAPSULE | Freq: Two times a day (BID) | ORAL | Status: DC
  Administered 2020-09-20 – 2020-09-22 (×4): 10 mg via ORAL
  Filled 2020-09-20 (×5): qty 1

## 2020-09-20 MED ORDER — LACTATED RINGERS IV SOLN: INTRAVENOUS | Status: DC

## 2020-09-20 MED ORDER — LIDOCAINE-EPINEPHRINE 1 %-1:100000 IJ SOLN
INTRAMUSCULAR | Status: DC | PRN
  Administered 2020-09-20: 2 mL

## 2020-09-20 MED ORDER — LIDOCAINE-EPINEPHRINE 1 %-1:100000 IJ SOLN
INTRAMUSCULAR | Status: AC
  Filled 2020-09-20: qty 1

## 2020-09-20 MED ORDER — ONDANSETRON HCL 4 MG/2ML IJ SOLN
INTRAMUSCULAR | Status: DC | PRN
  Administered 2020-09-20: 4 mg via INTRAVENOUS

## 2020-09-20 MED ORDER — BACITRACIN ZINC 500 UNIT/GM EX OINT
1.0000 "application " | TOPICAL_OINTMENT | Freq: Three times a day (TID) | CUTANEOUS | Status: DC
  Administered 2020-09-21 – 2020-09-22 (×3): 1 via TOPICAL
  Filled 2020-09-20: qty 28.35

## 2020-09-20 MED ORDER — PROMETHAZINE HCL 25 MG RE SUPP
25.0000 mg | Freq: Four times a day (QID) | RECTAL | Status: DC | PRN
  Filled 2020-09-20: qty 1

## 2020-09-20 MED ORDER — VITAMIN D 25 MCG (1000 UNIT) PO TABS
5000.0000 [IU] | ORAL_TABLET | Freq: Every day | ORAL | Status: DC
  Administered 2020-09-21 – 2020-09-22 (×2): 5000 [IU] via ORAL
  Filled 2020-09-20 (×2): qty 5

## 2020-09-20 MED ORDER — TIMOLOL HEMIHYDRATE 0.5 % OP SOLN: 1.0000 [drp] | Freq: Every day | OPHTHALMIC | Status: DC

## 2020-09-20 MED ORDER — 0.9 % SODIUM CHLORIDE (POUR BTL) OPTIME
TOPICAL | Status: DC | PRN
  Administered 2020-09-20: 1000 mL

## 2020-09-20 MED ORDER — CHLORHEXIDINE GLUCONATE 0.12 % MT SOLN
15.0000 mL | Freq: Once | OROMUCOSAL | Status: AC
  Administered 2020-09-20: 15 mL via OROMUCOSAL
  Filled 2020-09-20: qty 15

## 2020-09-20 MED ORDER — BACITRACIN ZINC 500 UNIT/GM EX OINT
TOPICAL_OINTMENT | CUTANEOUS | Status: AC
  Filled 2020-09-20: qty 28.35

## 2020-09-20 MED ORDER — PHENYLEPHRINE 40 MCG/ML (10ML) SYRINGE FOR IV PUSH (FOR BLOOD PRESSURE SUPPORT)
PREFILLED_SYRINGE | INTRAVENOUS | Status: DC | PRN
  Administered 2020-09-20 (×2): 80 ug via INTRAVENOUS

## 2020-09-20 MED ORDER — PROPOFOL 10 MG/ML IV BOLUS
INTRAVENOUS | Status: AC
  Filled 2020-09-20: qty 20

## 2020-09-20 MED ORDER — ATORVASTATIN CALCIUM 40 MG PO TABS
40.0000 mg | ORAL_TABLET | Freq: Every day | ORAL | Status: DC
  Administered 2020-09-21 – 2020-09-22 (×2): 40 mg via ORAL
  Filled 2020-09-20 (×2): qty 1

## 2020-09-20 MED ORDER — ADULT MULTIVITAMIN W/MINERALS CH
1.0000 | ORAL_TABLET | Freq: Every day | ORAL | Status: DC
  Administered 2020-09-21 – 2020-09-22 (×2): 1 via ORAL
  Filled 2020-09-20 (×2): qty 1

## 2020-09-20 MED ORDER — CIPROFLOXACIN-DEXAMETHASONE 0.3-0.1 % OT SUSP
OTIC | Status: AC
  Filled 2020-09-20: qty 7.5

## 2020-09-20 MED ORDER — ONDANSETRON HCL 4 MG/2ML IJ SOLN
INTRAMUSCULAR | Status: AC
  Filled 2020-09-20: qty 2

## 2020-09-20 MED ORDER — ONDANSETRON HCL 4 MG/2ML IJ SOLN: 4.0000 mg | Freq: Once | INTRAMUSCULAR | Status: DC | PRN

## 2020-09-20 MED ORDER — EPHEDRINE SULFATE-NACL 50-0.9 MG/10ML-% IV SOSY
PREFILLED_SYRINGE | INTRAVENOUS | Status: DC | PRN
  Administered 2020-09-20 (×2): 10 mg via INTRAVENOUS
  Administered 2020-09-20: 5 mg via INTRAVENOUS

## 2020-09-20 MED ORDER — ACETAMINOPHEN 10 MG/ML IV SOLN: 1000.0000 mg | Freq: Once | INTRAVENOUS | Status: DC | PRN

## 2020-09-20 MED ORDER — LATANOPROST 0.005 % OP SOLN
1.0000 [drp] | Freq: Every day | OPHTHALMIC | Status: DC
  Administered 2020-09-21 – 2020-09-22 (×2): 1 [drp] via OPHTHALMIC
  Filled 2020-09-20: qty 2.5

## 2020-09-20 MED ORDER — CEFAZOLIN SODIUM-DEXTROSE 1-4 GM/50ML-% IV SOLN
1.0000 g | Freq: Three times a day (TID) | INTRAVENOUS | Status: AC
  Administered 2020-09-20 – 2020-09-21 (×3): 1 g via INTRAVENOUS
  Filled 2020-09-20 (×3): qty 50

## 2020-09-20 MED ORDER — ACETAMINOPHEN 500 MG PO TABS
1000.0000 mg | ORAL_TABLET | Freq: Three times a day (TID) | ORAL | Status: DC | PRN
  Administered 2020-09-22: 1000 mg via ORAL
  Filled 2020-09-20: qty 2

## 2020-09-20 MED ORDER — LORAZEPAM 1 MG PO TABS: 1.0000 mg | ORAL_TABLET | Freq: Three times a day (TID) | ORAL | Status: DC | PRN

## 2020-09-20 MED ORDER — FENTANYL CITRATE (PF) 250 MCG/5ML IJ SOLN
INTRAMUSCULAR | Status: AC
  Filled 2020-09-20: qty 5

## 2020-09-20 MED ORDER — TIMOLOL MALEATE 0.5 % OP SOLN
1.0000 [drp] | Freq: Every day | OPHTHALMIC | Status: DC
  Administered 2020-09-21 – 2020-09-22 (×2): 1 [drp] via OPHTHALMIC
  Filled 2020-09-20 (×2): qty 5

## 2020-09-20 MED ORDER — ASPIRIN EC 81 MG PO TBEC
81.0000 mg | DELAYED_RELEASE_TABLET | Freq: Every day | ORAL | Status: DC
  Administered 2020-09-21 – 2020-09-22 (×2): 81 mg via ORAL
  Filled 2020-09-20 (×2): qty 1

## 2020-09-20 MED ORDER — PHENYLEPHRINE 40 MCG/ML (10ML) SYRINGE FOR IV PUSH (FOR BLOOD PRESSURE SUPPORT)
PREFILLED_SYRINGE | INTRAVENOUS | Status: AC
  Filled 2020-09-20: qty 10

## 2020-09-20 MED ORDER — IBUPROFEN 100 MG/5ML PO SUSP: 400.0000 mg | Freq: Four times a day (QID) | ORAL | Status: DC | PRN

## 2020-09-20 MED ORDER — SUCCINYLCHOLINE CHLORIDE 200 MG/10ML IV SOSY
PREFILLED_SYRINGE | INTRAVENOUS | Status: DC | PRN
  Administered 2020-09-20: 100 mg via INTRAVENOUS

## 2020-09-20 MED ORDER — PROMETHAZINE HCL 25 MG PO TABS: 25.0000 mg | ORAL_TABLET | Freq: Four times a day (QID) | ORAL | Status: DC | PRN

## 2020-09-20 MED ORDER — INSULIN ASPART 100 UNIT/ML ~~LOC~~ SOLN: 0.0000 [IU] | Freq: Three times a day (TID) | SUBCUTANEOUS | Status: DC

## 2020-09-20 MED ORDER — LIDOCAINE 2% (20 MG/ML) 5 ML SYRINGE
INTRAMUSCULAR | Status: AC
  Filled 2020-09-20: qty 5

## 2020-09-20 MED ORDER — PHENYLEPHRINE HCL-NACL 10-0.9 MG/250ML-% IV SOLN
INTRAVENOUS | Status: DC | PRN
  Administered 2020-09-20: 20 ug/min via INTRAVENOUS

## 2020-09-20 MED ORDER — FENTANYL CITRATE (PF) 250 MCG/5ML IJ SOLN
INTRAMUSCULAR | Status: DC | PRN
  Administered 2020-09-20 (×2): 50 ug via INTRAVENOUS
  Administered 2020-09-20: 100 ug via INTRAVENOUS
  Administered 2020-09-20 (×2): 25 ug via INTRAVENOUS
  Administered 2020-09-20: 50 ug via INTRAVENOUS

## 2020-09-20 MED ORDER — EPHEDRINE 5 MG/ML INJ
INTRAVENOUS | Status: AC
  Filled 2020-09-20: qty 10

## 2020-09-20 MED ORDER — HYDROCODONE-ACETAMINOPHEN 5-325 MG PO TABS: 1.0000 | ORAL_TABLET | ORAL | Status: DC | PRN

## 2020-09-20 MED ORDER — FENTANYL CITRATE (PF) 100 MCG/2ML IJ SOLN: 25.0000 ug | INTRAMUSCULAR | Status: DC | PRN

## 2020-09-20 MED ORDER — SUCCINYLCHOLINE CHLORIDE 200 MG/10ML IV SOSY
PREFILLED_SYRINGE | INTRAVENOUS | Status: AC
  Filled 2020-09-20: qty 10

## 2020-09-20 MED ORDER — LIDOCAINE 2% (20 MG/ML) 5 ML SYRINGE
INTRAMUSCULAR | Status: DC | PRN
  Administered 2020-09-20: 60 mg via INTRAVENOUS

## 2020-09-20 MED ORDER — ORAL CARE MOUTH RINSE: 15.0000 mL | Freq: Once | OROMUCOSAL | Status: AC

## 2020-09-20 MED ORDER — REMIFENTANIL HCL 1 MG IV SOLR
0.0500 ug/kg/min | INTRAVENOUS | Status: DC
Start: 2020-09-20 — End: 2020-09-21
  Administered 2020-09-20: .05 ug/kg/min via INTRAVENOUS
  Filled 2020-09-20 (×2): qty 5000

## 2020-09-20 MED ORDER — DEXAMETHASONE SODIUM PHOSPHATE 10 MG/ML IJ SOLN
INTRAMUSCULAR | Status: AC
  Filled 2020-09-20: qty 1

## 2020-09-20 SURGICAL SUPPLY — 71 items
APPLIER CLIP 9.375 SM OPEN (CLIP)
ATTRACTOMAT 16X20 MAGNETIC DRP (DRAPES) ×3 IMPLANT
BLADE SURG 15 STRL LF DISP TIS (BLADE) ×2 IMPLANT
BLADE SURG 15 STRL SS (BLADE) ×3
CANISTER SUCT 3000ML PPV (MISCELLANEOUS) ×3 IMPLANT
CLEANER TIP ELECTROSURG 2X2 (MISCELLANEOUS) ×3 IMPLANT
CLIP APPLIE 9.375 SM OPEN (CLIP) IMPLANT
CLIP VESOCCLUDE MED 6/CT (CLIP) ×12 IMPLANT
CNTNR URN SCR LID CUP LEK RST (MISCELLANEOUS) ×8 IMPLANT
CONT SPEC 4OZ STRL OR WHT (MISCELLANEOUS) ×12
CORD BIPOLAR FORCEPS 12FT (ELECTRODE) ×3 IMPLANT
COTTONBALL LRG STERILE PKG (GAUZE/BANDAGES/DRESSINGS) ×3 IMPLANT
COVER SURGICAL LIGHT HANDLE (MISCELLANEOUS) ×3 IMPLANT
DERMABOND ADVANCED (GAUZE/BANDAGES/DRESSINGS)
DERMABOND ADVANCED .7 DNX12 (GAUZE/BANDAGES/DRESSINGS) IMPLANT
DRAIN CHANNEL 15F RND FF W/TCR (WOUND CARE) IMPLANT
DRAIN JP 10F RND SILICONE (MISCELLANEOUS) ×3 IMPLANT
DRAPE HALF SHEET 40X57 (DRAPES) ×3 IMPLANT
DRAPE INCISE 23X17 IOBAN STRL (DRAPES) ×1
DRAPE INCISE IOBAN 23X17 STRL (DRAPES) ×2 IMPLANT
DRAPE MICROSCOPE LEICA 46X105 (MISCELLANEOUS) IMPLANT
DRESSING MEROCEL 8CM (GAUZE/BANDAGES/DRESSINGS) ×2 IMPLANT
DRSG GLASSCOCK MASTOID ADT (GAUZE/BANDAGES/DRESSINGS) ×3 IMPLANT
DRSG MEROCEL 8CM (GAUZE/BANDAGES/DRESSINGS) ×3
DRSG TEGADERM 4X4.75 (GAUZE/BANDAGES/DRESSINGS) ×3 IMPLANT
ELECT COATED BLADE 2.86 ST (ELECTRODE) ×3 IMPLANT
ELECT REM PT RETURN 9FT ADLT (ELECTROSURGICAL) ×3
ELECTRODE REM PT RTRN 9FT ADLT (ELECTROSURGICAL) ×2 IMPLANT
EVACUATOR SILICONE 100CC (DRAIN) ×3 IMPLANT
FORCEPS BIPOLAR SPETZLER 8 1.0 (NEUROSURGERY SUPPLIES) ×3 IMPLANT
GAUZE 4X4 16PLY RFD (DISPOSABLE) ×9 IMPLANT
GAUZE XEROFORM 5X9 LF (GAUZE/BANDAGES/DRESSINGS) ×3 IMPLANT
GLOVE BIO SURGEON STRL SZ 6.5 (GLOVE) ×3 IMPLANT
GLOVE ECLIPSE 7.5 STRL STRAW (GLOVE) ×12 IMPLANT
GOWN STRL NON-REIN LRG LVL3 (GOWN DISPOSABLE) ×6 IMPLANT
GOWN STRL REUS W/ TWL LRG LVL3 (GOWN DISPOSABLE) ×4 IMPLANT
GOWN STRL REUS W/TWL LRG LVL3 (GOWN DISPOSABLE) ×6
KIT BASIN OR (CUSTOM PROCEDURE TRAY) ×3 IMPLANT
KIT TURNOVER KIT B (KITS) ×3 IMPLANT
LOCATOR NERVE 3 VOLT (DISPOSABLE) ×3 IMPLANT
NEEDLE PRECISIONGLIDE 27X1.5 (NEEDLE) ×3 IMPLANT
NS IRRIG 1000ML POUR BTL (IV SOLUTION) ×3 IMPLANT
PAD ARMBOARD 7.5X6 YLW CONV (MISCELLANEOUS) ×9 IMPLANT
PENCIL FOOT CONTROL (ELECTRODE) ×3 IMPLANT
PUNCH BIOPSY 4MM (MISCELLANEOUS) ×3
PUNCH BIOPSY DISP 4 (MISCELLANEOUS) ×2 IMPLANT
SHEARS HARMONIC 9CM CVD (BLADE) ×3 IMPLANT
SPECIMEN JAR MEDIUM (MISCELLANEOUS) IMPLANT
SPONGE INTESTINAL PEANUT (DISPOSABLE) IMPLANT
SPONGE LAP 18X18 RF (DISPOSABLE) IMPLANT
STAPLER VISISTAT 35W (STAPLE) ×3 IMPLANT
SUT CHROMIC 3 0 SH 27 (SUTURE) ×3 IMPLANT
SUT CHROMIC 4 0 PS 2 18 (SUTURE) ×3 IMPLANT
SUT CHROMIC 5 0 P 3 (SUTURE) IMPLANT
SUT ETHILON 3 0 PS 1 (SUTURE) ×3 IMPLANT
SUT ETHILON 5 0 P 3 18 (SUTURE)
SUT ETHILON 5 0 PS 2 18 (SUTURE) IMPLANT
SUT NYLON ETHILON 5-0 P-3 1X18 (SUTURE) IMPLANT
SUT SILK 2 0 REEL (SUTURE) IMPLANT
SUT SILK 2 0 SH CR/8 (SUTURE) ×3 IMPLANT
SUT SILK 3 0 SH CR/8 (SUTURE) ×3 IMPLANT
SUT SILK 4 0 REEL (SUTURE) ×3 IMPLANT
SUT VIC AB 3-0 SH 18 (SUTURE) ×3 IMPLANT
SYR BULB EAR ULCER 3OZ GRN STR (SYRINGE) ×3 IMPLANT
SYR CONTROL 10ML LL (SYRINGE) ×3 IMPLANT
TAPE HY-TAPE 1X5Y PINK NS LF (GAUZE/BANDAGES/DRESSINGS) ×3 IMPLANT
TOWEL GREEN STERILE FF (TOWEL DISPOSABLE) ×6 IMPLANT
TRAY ENT MC OR (CUSTOM PROCEDURE TRAY) ×3 IMPLANT
TRAY FOLEY W/BAG SLVR 14FR (SET/KITS/TRAYS/PACK) ×3 IMPLANT
TUBE CONNECTING 12X1/4 (SUCTIONS) ×3 IMPLANT
TUBE FEEDING 10FR FLEXIFLO (MISCELLANEOUS) IMPLANT

## 2020-09-20 NOTE — Anesthesia Procedure Notes (Signed)
Procedure Name: Intubation Date/Time: 09/20/2020 1:00 PM Performed by: Dorthea Cove, CRNA Pre-anesthesia Checklist: Patient identified, Emergency Drugs available, Suction available and Patient being monitored Patient Re-evaluated:Patient Re-evaluated prior to induction Oxygen Delivery Method: Circle system utilized Preoxygenation: Pre-oxygenation with 100% oxygen Induction Type: IV induction Ventilation: Mask ventilation without difficulty Laryngoscope Size: Miller and 3 Grade View: Grade II Tube type: Oral Tube size: 7.5 mm Number of attempts: 1 Airway Equipment and Method: Stylet and Oral airway Placement Confirmation: ETT inserted through vocal cords under direct vision,  positive ETCO2 and breath sounds checked- equal and bilateral Secured at: 23 cm Tube secured with: Tape Dental Injury: Injury to lip and Teeth and Oropharynx as per pre-operative assessment  Difficulty Due To: Difficulty was unanticipated, Difficult Airway- due to reduced neck mobility and Difficult Airway- due to anterior larynx Comments: Airway attempted x3. Attempt 1 by CRNA with MAC 4- grade 3 view unable to pass tube. Stoltz[place]us, MD attempted with Mac 4- grade 3 view unable to pass tube. MD successfully intubated with Sabra Heck 3 with a grade 2 view. Small lip injury noted on the left upper lip.

## 2020-09-20 NOTE — Interval H&P Note (Signed)
History and Physical Interval Note:  09/20/2020 11:59 AM  Shaun Mcmillan  has presented today for surgery, with the diagnosis of Parotid mass.  The various methods of treatment have been discussed with the patient and family. After consideration of risks, benefits and other options for treatment, the patient has consented to  Procedure(s): PAROTIDECTOMY (Left) RADICAL NECK DISSECTION (Left) SKIN GRAFT FULL THICKNESS WITH POSSIBLE NERVE GRAFT (Left) as a surgical intervention.  The patient's history has been reviewed, patient examined, no change in status, stable for surgery.  I have reviewed the patient's chart and labs.  Questions were answered to the patient's satisfaction.     Izora Gala

## 2020-09-20 NOTE — OR Nursing (Signed)
Per call from D. Melina Copa, Pathologist, to Dr. 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

## 2020-09-20 NOTE — Transfer of Care (Signed)
Immediate Anesthesia Transfer of Care Note  Patient: Shaun Mcmillan  Procedure(s) Performed: PAROTIDECTOMY with resection of skin and rotational flap (Left Face)  Patient Location: PACU  Anesthesia Type:General  Level of Consciousness: awake, alert  and oriented  Airway & Oxygen Therapy: Patient Spontanous Breathing and Patient connected to nasal cannula oxygen  Post-op Assessment: Report given to RN and Post -op Vital signs reviewed and stable  Post vital signs: Reviewed and stable  Last Vitals:  Vitals Value Taken Time  BP 128/72 09/20/20 1654  Temp    Pulse 91 09/20/20 1656  Resp 15 09/20/20 1656  SpO2 98 % 09/20/20 1656  Vitals shown include unvalidated device data.  Last Pain:  Vitals:   09/20/20 1205  TempSrc:   PainSc: 0-No pain      Patients Stated Pain Goal: 4 (16/94/50 3888)  Complications: No complications documented.

## 2020-09-20 NOTE — OR Nursing (Signed)
Notified receptionist at waiting room desk to update the family of procedure closing; pt tolerated procedure well. Dr Constance Holster advised.

## 2020-09-20 NOTE — Anesthesia Preprocedure Evaluation (Signed)
Anesthesia Evaluation  Patient identified by MRN, date of birth, ID band Patient awake    Reviewed: Allergy & Precautions, NPO status , Patient's Chart, lab work & pertinent test results  Airway Mallampati: II  TM Distance: >3 FB Neck ROM: Full    Dental  (+) Teeth Intact   Pulmonary neg pulmonary ROS,    Pulmonary exam normal        Cardiovascular hypertension, Pt. on medications  Rhythm:Regular Rate:Normal     Neuro/Psych  Headaches, negative psych ROS   GI/Hepatic negative GI ROS, Neg liver ROS,   Endo/Other  diabetes, Well Controlled, Type 2, Oral Hypoglycemic Agents  Renal/GU negative Renal ROS  negative genitourinary   Musculoskeletal  (+) Arthritis , Osteoarthritis,    Abdominal (+)  Abdomen: soft. Bowel sounds: normal.  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 No structural deviations.  Reproductive/Obstetrics                             Anesthesia Physical Anesthesia Plan  ASA: II  Anesthesia Plan: General   Post-op Pain Management:    Induction: Intravenous  PONV Risk Score and Plan: 2 and Ondansetron, Dexamethasone, Treatment may vary due to age or medical condition and Midazolam  Airway Management Planned: Mask and Oral ETT  Additional Equipment: None  Intra-op Plan:   Post-operative Plan: Extubation in OR  Informed Consent: I have reviewed the patients History and Physical, chart, labs and discussed the procedure including the risks, benefits and alternatives for the proposed anesthesia with the patient or authorized representative who has indicated his/her understanding and acceptance.     Dental advisory given  Plan Discussed with: CRNA  Anesthesia Plan Comments: (Lab Results      Component                Value               Date                      WBC                       9.9                 02/10/2020                HGB                      14.3                09/20/2020                HCT                      42.0                09/20/2020                MCV                      87.3                02/10/2020                PLT  279                 02/10/2020           Lab Results      Component                Value               Date                      NA                       143                 09/20/2020                K                        3.6                 09/20/2020                CO2                      28                  02/10/2020                GLUCOSE                  141 (H)             09/20/2020                BUN                      13                  09/20/2020                CREATININE               0.80                09/20/2020                CALCIUM                  10.4 (H)            02/10/2020                GFRNONAA                 78.86               05/30/2009                GFRAA                                        11/21/2008            >60        The eGFR has been calculated using the MDRD equation. This calculation has not been validated in all clinical situations. eGFR's persistently <60 mL/min signify possible Chronic Kidney Disease.)        Anesthesia Quick  Evaluation

## 2020-09-20 NOTE — Op Note (Signed)
OPERATIVE REPORT  DATE OF SURGERY: 09/20/2020  PATIENT:  Shaun Mcmillan,  80 y.o. male  PRE-OPERATIVE DIAGNOSIS:  Parotid mass  POST-OPERATIVE DIAGNOSIS:  Parotid mass  PROCEDURE:  Procedure(s): PAROTIDECTOMY with resection of skin and rotational flap  SURGEON:  Beckie Salts, MD  ASSISTANTS: RNFA  ANESTHESIA:   General   EBL: 200 ml  DRAINS:  10 French round JP   LOCAL MEDICATIONS USED: 1% Xylocaine with epinephrine  SPECIMEN: 1.  Punch biopsy from the epidermal side of the parotid mass, negative for carcinoma on frozen section  2.  Left superficial parotidectomy for routine.  3.  Portion of superficial parotidectomy for frozen section, consistent with squamous cell carcinoma.  4.  Upper deep lobe parotid for routine pathology.  COUNTS:  Correct  PROCEDURE DETAILS: The patient was taken to the operating room and placed on the operating table in the supine position. Following induction of general endotracheal anesthesia, the left face and neck were prepped and draped in a standard fashion.  The left thigh was also prepped in case of possible skin graft need.  The lesion was identified in the left parotid.  It was erupting through the skin and a small area just in the preauricular place.  A 4 mm dermal punch was used initially to take a sample of this and sent for frozen section analysis.  This was negative for carcinoma.  An ellipse of skin was outlined around the obvious dermal spread.  The skin paddle was approximately 6 x 3 cm in size.  Electrocautery was used to incise the skin with extension superiorly and inferiorly in a standard parotidectomy incision fashion.  A skin flap was developed anteriorly.  This was done in the subcutaneous plane.  Parotid fascia was kept intact.  Posteriorly this lesion was dissected off of the ear canal and in 1 area was adherent to the ear canal so part of the ear canal skin was removed, approximately 1 cm diameter.  The earlobe was retracted  posteriorly with a stay suture.  The skin flap was retracted anteriorly with a similar silk stay suture.  The upper sternocleidomastoid muscle was identified and the fascial tissue was dissected off of the muscle brought anteriorly.  Dissection continued superiorly exposing the posterior belly of the digastric muscle.  The parotid was dissected anteriorly and the facial nerve main trunk was identified.  Careful dissection was used to follow this out to the pes anserinus and then the individual branches were dissected starting inferiorly.  The harmonic dissector was used to divide the parotid tissue off of the nerve.  The superior branch of the nerve was adherent to obvious tumor tissue.  This was dissected off in a blunt fashion preserving the nerve branches.  The parotid specimen was removed and a portion was cut off with a scalpel and sent for frozen section analysis.  This was consistent with squamous cell carcinoma.  There was residual obvious gross disease deep to the plane of the facial nerve and the deep lobe just superior to the uppermost branch of the facial nerve.  This was carefully dissected from surrounding tissue and sent as a separate specimen as deep lobe superior.  During this deep lobe dissection a branch of the facial artery was clipped with medium size clips to prevent any additional bleeding as the tumor was intimate with the branch of this vessel.  Throughout the case a battery-operated nerve stimulator was used.  Stimulation of the various branches was only intermittent and not  reliable.  All branches were kept intact however.  The wound was thoroughly irrigated with saline.  Drain was exited through a separate stab incision inferiorly and secured in place with a nylon suture.  Additional undermining of the anterior fascial flap was created to mobilize the skin better and facilitate closure.  Wound was closed using interrupted 3-0 Vicryl in the deeper layer.  Running 4-0 chromic was used  from top to bottom.  A small nasal Merocel pack was cut about 60% of its thickness inflated with local anesthetic solution and passed into the ear canal to provide stenting and to prevent air leak.  Bacitracin was applied to the incisions into the ear canal prior to the packing placement.  A large Xeroform gauze was placed into the auricle and then a Glasscock mastoid dressing was applied.  These help prevent air leak from the drain.  Patient was awakened extubated and transferred to recovery in stable condition.    PATIENT DISPOSITION:  To PACU, stable

## 2020-09-20 NOTE — Anesthesia Postprocedure Evaluation (Signed)
Anesthesia Post Note  Patient: Shaun Mcmillan  Procedure(s) Performed: PAROTIDECTOMY with resection of skin and rotational flap (Left Face)     Patient location during evaluation: PACU Anesthesia Type: General Level of consciousness: awake and alert and oriented Pain management: pain level controlled Vital Signs Assessment: post-procedure vital signs reviewed and stable Respiratory status: spontaneous breathing, nonlabored ventilation and respiratory function stable Cardiovascular status: blood pressure returned to baseline and stable Postop Assessment: no apparent nausea or vomiting Anesthetic complications: no   No complications documented.  Last Vitals:  Vitals:   09/20/20 1739 09/20/20 1754  BP: (!) 143/77 132/80  Pulse: 91   Resp: 15 18  Temp:  (!) 36.1 C  SpO2: 96% 97%    Last Pain:  Vitals:   09/20/20 1754  TempSrc:   PainSc: 0-No pain                 Cassy Sprowl A.

## 2020-09-20 NOTE — OR Nursing (Signed)
Notified pt's family of procedure start via waiting room reception desk call. Pt tolerating procedure well.

## 2020-09-20 NOTE — OR Nursing (Signed)
Per call from D. Melina Copa, Pathologist, to Dr Constance Holster results of frozen section specimen #1 Left parotid: "salivary gland chronic inflammation; atrophic gland with inflammatory infiltrate".

## 2020-09-21 ENCOUNTER — Encounter (HOSPITAL_COMMUNITY): Payer: Self-pay | Admitting: Otolaryngology

## 2020-09-21 LAB — GLUCOSE, CAPILLARY
Glucose-Capillary: 120 mg/dL — ABNORMAL HIGH (ref 70–99)
Glucose-Capillary: 124 mg/dL — ABNORMAL HIGH (ref 70–99)
Glucose-Capillary: 117 mg/dL — ABNORMAL HIGH (ref 70–99)
Glucose-Capillary: 96 mg/dL (ref 70–99)

## 2020-09-21 NOTE — Progress Notes (Signed)
Subjective: No complaints.  He is very comfortable.  Objective: Vital signs in last 24 hours: Temp:  [97 F (36.1 C)-98.7 F (37.1 C)] 97.8 F (36.6 C) (04/07 0540) Pulse Rate:  [71-93] 86 (04/07 0540) Resp:  [15-19] 18 (04/07 0540) BP: (114-150)/(64-80) 135/67 (04/07 0540) SpO2:  [95 %-99 %] 96 % (04/07 0540) Weight:  [72.6 kg] 72.6 kg (04/06 1125) Weight change:  Last BM Date: 09/20/20  Intake/Output from previous day: 04/06 0701 - 04/07 0700 In: 1540 [P.O.:240; I.V.:1300] Out: 1835 [Urine:1610; Drains:25; Blood:200] Intake/Output this shift: No intake/output data recorded.  PHYSICAL EXAM: He is awake and alert.  Mastoid dressing in place.  Facial nerve function minimal on the left.  Drain is holding a seal.  No swelling or bruising.  No signs of infection.  Lab Results: Recent Labs    09/20/20 1210  HGB 14.3  HCT 42.0   BMET Recent Labs    09/20/20 1210  NA 143  K 3.6  CL 106  GLUCOSE 141*  BUN 13  CREATININE 0.80    Studies/Results: No results found.  Medications: I have reviewed the patient's current medications.  Assessment/Plan: Stable postop.  Continue with mastoid dressing and drain.  Consider removal of dressing tomorrow.  Recommend discharge home after the drain comes out may be in 1 or 2 more days.  We can stop IV fluids.  Ambulate.  LOS: 1 day   Shaun Mcmillan 09/21/2020, 7:38 AM

## 2020-09-22 LAB — SURGICAL PATHOLOGY

## 2020-09-22 LAB — GLUCOSE, CAPILLARY: Glucose-Capillary: 111 mg/dL — ABNORMAL HIGH (ref 70–99)

## 2020-09-22 NOTE — Progress Notes (Signed)
Continues to do very well.  The mastoid dressing was removed.  All of the skin is viable and healthy and is healing nicely.  The packing is still in place in the ear canal.  The drain was removed.  Facial nerve function the same.  Will discharge home today and remove the ear canal packing Monday in the office.

## 2020-09-22 NOTE — Discharge Instructions (Signed)
Avoid any strenuous or physical activity for the next 2 weeks.  If the packing falls out of the left ear it is okay to keep it out.  If the left eye bothers you tape and closed at night.  You may also use lubricating eyedrops that are available over-the-counter as needed.  Use Tylenol and/or Motrin as needed for pain.

## 2020-09-22 NOTE — Progress Notes (Signed)
Discharge Home. Home discharge instruction given, nio question verbalized.

## 2020-09-22 NOTE — Plan of Care (Signed)

## 2020-09-22 NOTE — Discharge Summary (Signed)
Physician Discharge Summary  Patient ID: Shaun Mcmillan MRN: [id number] DOB/AGE: 80/12/1940 80 y.o.  Admit date: 09/20/2020 Discharge date: 09/22/2020  Admission Diagnoses: Metastatic squamous cell carcinoma left parotid  Discharge Diagnoses:  Active Problems:   Metastatic squamous cell carcinoma ([place])   Discharged Condition: good  Hospital Course: No complications  Consults: none  Significant Diagnostic Studies: none  Treatments: surgery: Left parotidectomy with skin resection and ear canal and facial skin reconstruction with local flap advancement  Discharge Exam: Blood pressure (!) 141/77, pulse 81, temperature 98.3 F (36.8 C), temperature source Oral, resp. rate 17, height 5\' 9"  (1.753 m), weight 72.6 kg, SpO2 97 %. PHYSICAL EXAM: He is awake and alert.  Breathing and voice are clear.  Incisions look excellent.  No signs of infection or hematoma.  Left facial nerve paralysis.  Disposition: Discharge disposition: 01-Home or Self Care       Discharge Instructions    Diet - low sodium heart healthy   Complete by: As directed    Discharge wound care:   Complete by: As directed    Apply antibiotic ointment to all of the incisions twice daily.  Keep everything clean and dry.   Increase activity slowly   Complete by: As directed      Allergies as of 09/22/2020   No Known Allergies     Medication List    TAKE these medications   acetaminophen 500 MG tablet Commonly known as: TYLENOL Take 1,000 mg by mouth every 8 (eight) hours as needed for moderate pain.   amLODipine 10 MG tablet Commonly known as: NORVASC Take 1 tablet (10 mg total) by mouth daily.   aspirin EC 81 MG tablet Take 1 tablet (81 mg total) by mouth daily. Swallow whole.   atorvastatin 40 MG tablet Commonly known as: LIPITOR Take 1 tablet (40 mg total) by mouth daily.   diclofenac 75 MG EC tablet Commonly known as: VOLTAREN Take 1 tablet (75 mg total) by mouth 2 (two) times daily.    latanoprost 0.005 % ophthalmic solution Commonly known as: XALATAN Place 1 drop into both eyes every morning.   LORazepam 1 MG tablet Commonly known as: ATIVAN Take 1 tablet (1 mg total) by mouth 3 (three) times daily as needed for anxiety.   melatonin 5 MG Tabs Take 5 mg by mouth at bedtime.   metFORMIN 500 MG tablet Commonly known as: GLUCOPHAGE TAKE 1 TABLET BY MOUTH  TWICE DAILY WITH A MEAL What changed:   how much to take  how to take this  when to take this   multivitamin tablet Take 1 tablet by mouth daily.   ramipril 10 MG capsule Commonly known as: ALTACE TAKE 1 CAPSULE BY MOUTH  TWICE DAILY What changed:   how much to take  how to take this  when to take this   timolol 0.5 % ophthalmic solution Commonly known as: BETIMOL Place 1 drop into both eyes daily.   traMADol 50 MG tablet Commonly known as: ULTRAM Take 2 tablets (100 mg total) by mouth every 6 (six) hours as needed for moderate pain.   VITAMIN D PO Take 5,000 Units by mouth daily.            Discharge Care Instructions  (From admission, onward)         Start     Ordered   09/22/20 0000  Discharge wound care:       Comments: Apply antibiotic ointment to all of the incisions twice daily.  Keep everything clean and dry.   09/22/20 1102          Follow-up Information    Izora Gala, MD. Schedule an appointment as soon as possible for a visit on 09/25/2020.   Specialty: Otolaryngology Contact information: [address] [place]  [address] [phone]               Signed: Izora Gala 09/22/2020, 11:03 AM

## 2020-09-27 ENCOUNTER — Other Ambulatory Visit (HOSPITAL_COMMUNITY): Payer: Self-pay | Admitting: Otolaryngology

## 2020-09-27 DIAGNOSIS — C07 Malignant neoplasm of parotid gland: Secondary | ICD-10-CM

## 2020-09-28 NOTE — Progress Notes (Signed)
Head and Neck Cancer Location of Tumor / Histology: Squamous cell carcinoma of LEFT parotid gland  Patient presented with symptoms of:  tenderness and swelling in front of the left ear for several weeks. It was getting a little bit worse when he first met with Dr. Izora Gala. He has a history of skin cancers but he cannot recall any specific sites in the head and neck area CT Maxillofacial w/ Contrast --09/12/2020 IMPRESSION: Approximately 2.3 cm left parotid mass, suboptimally evaluated due to artifact but concerning for possible primary parotid malignancy or a malignant lymph node.  Biopsies revealed: 09/20/2020 FINAL MICROSCOPIC DIAGNOSIS:  A. PAROTID, LEFT, BIOPSY:  - Microscopic focus of squamous cell carcinoma.  - Inflammation and fibrosis.  B. PAROTID, LEFT, BIOPSY:  - Squamous cell carcinoma.  C. PAROTID, LEFT, PAROTIDECTOMY:  - Squamous cell carcinoma, 3.8 cm.  - Carcinoma broadly involves the deep margin.  - See comment.  D. PAROTID, LEFT UPPER DEEP LOBE, EXCISION:  - Squamous cell carcinoma with perineural invasion.  - Carcinoma involves margins of specimen.  COMMENT:  C. The left parotidectomy specimen has a 3.8 x 2.4 x 2 cm moderately differentiated squamous cell carcinoma. The carcinoma is associated with inflammation and fibrosis. There is no identifiable lymph node tissue. The carcinoma broadly involves the deep margin of the specimen.  Nutrition Status Yes No Comments  Weight changes? '[]'  '[x]'    Swallowing concerns? '[]'  '[x]'    PEG? '[]'  '[x]'     Referrals Yes No Comments  Social Work? '[x]'  '[]'    Dentistry? '[x]'  '[]'    Swallowing therapy? '[x]'  '[]'    Nutrition? '[x]'  '[]'    Med/Onc? '[]'  '[x]'  No referral placed   Safety Issues Yes No Comments  Prior radiation? '[]'  '[x]'    Pacemaker/ICD? '[]'  '[x]'    Possible current pregnancy? '[]'  '[x]'  N/A  Is the patient on methotrexate? '[]'  '[x]'     Tobacco/Marijuana/Snuff/ETOH use: Patient has never smoked or used smokeless tobacco. Reports very rare  alcohol consumption. Denies any recreational drug use  Past/Anticipated interventions by otolaryngology, if any:  09/20/2020 Dr. Izora Gala PAROTIDECTOMY with resection of skin and rotational flap  Past/Anticipated interventions by medical oncology, if any:  No referral placed at this time  Current Complaints / other details:   Patient has received both Pfizer vaccines as well as 3rd Pfizer dose. PET scan scheduled for 10/04/2020

## 2020-09-28 NOTE — Progress Notes (Signed)
Oncology Nurse Navigator Documentation  Placed introductory call to new referral patient Lundon Verdejo.   Introduced myself as the H&N oncology nurse navigator that works with Dr. Isidore Moos to whom he has been referred by Dr. Constance Holster.  He confirmed understanding of referral.  Briefly explained my role as his navigator, provided my contact information.   Confirmed understanding of upcoming appts and [place] location, explained arrival and registration process.  I explained the purpose of a dental evaluation prior to starting RT, indicated he would be contacted by [facility] to arrange an appt.    I encouraged him to call with questions/concerns as he moves forward with appts and procedures.    He verbalized understanding of information provided, expressed appreciation for my call.   Navigator Initial Assessment . Employment Status: he is retired . Currently on FMLA / STD: no . Living Situation: He lives with his wife.  . Support System: wife, family . PCP: Alysia Penna MD . PCD: yes, he reports that he has all teeth.  . Financial Concerns: no . Transportation Needs: no . Sensory Deficits:no . Language Barriers/Interpreter Needed:  no . Ambulation Needs: no . DME Used in Home: no . Psychosocial Needs:  no . Concerns/Needs Understanding Cancer:  addressed/answered by navigator to best of ability . Self-Expressed Needs: no   Harlow Asa RN, BSN, OCN Head & Neck Oncology Nurse [facility] Phone # [phone]  Fax # [phone]

## 2020-10-02 ENCOUNTER — Ambulatory Visit
Admission: RE | Admit: 2020-10-02 | Discharge: 2020-10-02 | Disposition: A | Discharge status: Home / Self Care | Payer: Medicare Other | Source: Ambulatory Visit | Attending: Radiation Oncology | Admitting: Radiation Oncology

## 2020-10-02 ENCOUNTER — Encounter: Payer: Self-pay | Admitting: Radiation Oncology

## 2020-10-02 ENCOUNTER — Other Ambulatory Visit: Payer: Self-pay

## 2020-10-02 VITALS — BP 149/87 | HR 80 | Temp 97.9°F | Resp 19 | Wt 156.4 lb

## 2020-10-02 DIAGNOSIS — I10 Essential (primary) hypertension: Secondary | ICD-10-CM | POA: Diagnosis not present

## 2020-10-02 DIAGNOSIS — Z7984 Long term (current) use of oral hypoglycemic drugs: Secondary | ICD-10-CM | POA: Insufficient documentation

## 2020-10-02 DIAGNOSIS — E785 Hyperlipidemia, unspecified: Secondary | ICD-10-CM | POA: Diagnosis not present

## 2020-10-02 DIAGNOSIS — C07 Malignant neoplasm of parotid gland: Secondary | ICD-10-CM

## 2020-10-02 DIAGNOSIS — Z87442 Personal history of urinary calculi: Secondary | ICD-10-CM | POA: Diagnosis not present

## 2020-10-02 DIAGNOSIS — Z7982 Long term (current) use of aspirin: Secondary | ICD-10-CM | POA: Insufficient documentation

## 2020-10-02 DIAGNOSIS — Z86010 Personal history of colonic polyps: Secondary | ICD-10-CM | POA: Diagnosis not present

## 2020-10-02 DIAGNOSIS — C7989 Secondary malignant neoplasm of other specified sites: Secondary | ICD-10-CM

## 2020-10-02 DIAGNOSIS — E119 Type 2 diabetes mellitus without complications: Secondary | ICD-10-CM | POA: Insufficient documentation

## 2020-10-02 DIAGNOSIS — I6782 Cerebral ischemia: Secondary | ICD-10-CM | POA: Insufficient documentation

## 2020-10-02 DIAGNOSIS — Z79899 Other long term (current) drug therapy: Secondary | ICD-10-CM | POA: Diagnosis not present

## 2020-10-02 NOTE — Progress Notes (Signed)
Radiation Oncology         [phone] ________________________________  Initial outpatient Consultation  Name: Shaun Mcmillan MRN: [id number]  Date: 10/02/2020  DOB: 1940-12-16  CC:Fry, [name], MD  Izora Gala, MD   REFERRING PHYSICIAN: Izora Gala, MD  DIAGNOSIS:    ICD-10-CM   1. 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 male who presented with   tenderness and swelling in front of the left ear for several weeks. It was getting a little bit worse when he first met with Dr. Izora Gala. He has a history of skin cancers in the head and neck area including left forehead.  Subsequently, the patient saw Dr. Constance Holster who ordered CT maxillofacial.  09-12-20 CT showed: Approximately 2.3 cm left parotid mass, suboptimally evaluated due to artifact but concerning for possible primary parotid malignancy or a malignant lymph node. I have reviewed his images.  Parotidectomy with resection of skin and rotational flap occurred on 09-20-20 - path revealed:  FINAL MICROSCOPIC DIAGNOSIS:   A. PAROTID, LEFT, BIOPSY:  - Microscopic focus of squamous cell carcinoma.  - Inflammation and fibrosis.   B. PAROTID, LEFT, BIOPSY:  - Squamous cell carcinoma.   C. PAROTID, LEFT, PAROTIDECTOMY:  - Squamous cell carcinoma, 3.8 cm.  - Carcinoma broadly  involves the deep margin.  - See comment.   D. PAROTID, LEFT UPPER DEEP LOBE, EXCISION:  - Squamous cell carcinoma with perineural invasion.  - Carcinoma involves margins of specimen.   COMMENT:  C. The left parotidectomy specimen has a 3.8 x 2.4 x 2 cm moderately  differentiated squamous cell carcinoma. The carcinoma is associated  with inflammation and fibrosis. There is no identifiable lymph node  tissue. The carcinoma broadly involves the deep margin of the specimen.   PET pending.  Swallowing issues, if any: no  Weight Changes: no  Other symptoms: numbness in surgical site  Tobacco history, if any: no  ETOH abuse, if any: rare  Prior cancers, if any: multiple previous skin cancers; sees dermatologist this month to monitor and treat as needed  PREVIOUS RADIATION THERAPY: No  PAST MEDICAL HISTORY:  has a past medical history of Arthritis, Diabetes mellitus type II, Glaucoma, History of kidney stones, colonic polyps, Hyperlipidemia, Hypertension, Migraines, Shingles (04/2010), and Subdural hematoma ([place]) (11/13/08).    PAST SURGICAL HISTORY: Past Surgical History:  Procedure Laterality Date  . APPENDECTOMY    . colonscopy  03/12/2016   per Dr. Watt Climes, benign polyps, repeat in 5 yrs  . left craniotomy  11/03/08   per Dr. Sherley Bounds for a subdrual hematoma  . PAROTIDECTOMY Left 09/20/2020   Procedure: PAROTIDECTOMY with resection of skin and rotational flap;  Surgeon: Izora Gala, MD;  Location: [place];  Service: ENT;  Laterality: Left;  . surgery for ocmpound fracture     right leg    FAMILY HISTORY:  family history includes Heart disease in an other family member.  SOCIAL HISTORY:  reports that he has never smoked. He has never used smokeless tobacco. He reports previous alcohol use of about 1.0 standard drink of alcohol per week. He reports that he does not use drugs.  ALLERGIES: Patient has no known allergies.  MEDICATIONS:  Current Outpatient Medications   Medication Sig Dispense Refill  . acetaminophen (TYLENOL) 500 MG tablet Take 1,000 mg by mouth every 8 (eight) hours as needed for moderate pain.    Marland Kitchen amLODipine (NORVASC) 10 MG tablet Take 1 tablet (10 mg total) by mouth daily. 90 tablet 3  . aspirin EC 81 MG tablet Take 1 tablet (81 mg total) by mouth daily. Swallow whole. 30 tablet 11  . atorvastatin (LIPITOR) 40 MG tablet Take 1 tablet (40 mg total) by mouth daily. 90 tablet 3  . Cholecalciferol (VITAMIN D PO) Take 5,000 Units by mouth daily.    . diclofenac (VOLTAREN) 75 MG EC tablet Take 1 tablet (75 mg total) by mouth 2 (two) times daily. (Patient not taking: Reported on 09/15/2020) 60 tablet 0  . latanoprost (XALATAN) 0.005 % ophthalmic solution Place 1 drop into both eyes every morning.    Marland Kitchen LORazepam (ATIVAN) 1 MG tablet Take 1 tablet (1 mg total) by mouth 3 (three) times daily as needed for anxiety. 60 tablet 5  . Melatonin 5 MG TABS Take 5 mg by mouth at bedtime.    . metFORMIN (GLUCOPHAGE) 500 MG tablet TAKE 1 TABLET BY MOUTH  TWICE DAILY WITH A MEAL (Patient taking differently: Take 500 mg by mouth 2 (two) times daily with a meal. TAKE 1 TABLET BY MOUTH  TWICE DAILY WITH A MEAL) 180 tablet 3  . Multiple Vitamin (MULTIVITAMIN) tablet Take 1 tablet by mouth daily.    Marland Kitchen ofloxacin (FLOXIN) 0.3 % OTIC solution Place 5 drops into the left ear in the morning and at bedtime.    . ramipril (ALTACE) 10 MG capsule TAKE 1 CAPSULE BY MOUTH  TWICE DAILY (Patient taking differently: Take 10 mg by mouth 2 (two) times daily. TAKE 1 CAPSULE BY MOUTH  TWICE DAILY) 180 capsule 3  . timolol (BETIMOL) 0.5 % ophthalmic solution Place 1 drop into both eyes daily.    . traMADol (ULTRAM) 50 MG tablet Take 2 tablets (100 mg total) by mouth every 6 (six) hours as needed for moderate pain. (Patient not taking: Reported on 09/15/2020) 60 tablet 1   No current facility-administered medications for this encounter.    REVIEW OF SYSTEMS:  Notable for that above.    PHYSICAL EXAM:  weight is 156 lb 6 oz (70.9 kg). His oral temperature is 97.9 F (36.6 C). His blood pressure is 149/87 (abnormal) and his pulse is 80. His respiration is 19 and oxygen saturation is 98%.   General: Alert and oriented, in no acute distress HEENT: Head is normocephalic. Extraocular movements are intact. Oropharynx is notable for no lesions. Neck: Neck is notable for no masses; healing satisfactorily thus far in left preauricular region Neurologic: Left facial weakness/ droop.  Facial sensation intact with exception to surgical bed. Speech is fluent. Coordination is intact. Psychiatric: Judgment and insight are intact. Affect is appropriate.   ECOG = 1  0 - Asymptomatic (Fully active, able to carry on all predisease activities without restriction)  1 - Symptomatic but completely ambulatory (Restricted in physically strenuous activity but ambulatory and able to carry out work of a light or sedentary nature. For  example, light housework, office work)  2 - Symptomatic, <50% in bed during the day (Ambulatory and capable of all self care but unable to carry out any work activities. Up and about more than 50% of waking hours)  3 - Symptomatic, >50% in bed, but not bedbound (Capable of only limited self-care, confined to bed or chair 50% or more of waking hours)  4 - Bedbound (Completely disabled. Cannot carry on any self-care. Totally confined to bed or chair)  5 - Death   Eustace Pen MM, Creech RH, Tormey DC, et al. [phone]). "Toxicity and response criteria of the [facility]". [place]. 5 (6): 649-55   LABORATORY DATA:  Lab Results  Component Value Date   WBC 9.9 02/10/2020   HGB 14.3 09/20/2020   HCT 42.0 09/20/2020   MCV 87.3 02/10/2020   PLT 279 02/10/2020   CMP     Component Value Date/Time   NA 143 09/20/2020 1210   K 3.6 09/20/2020 1210   CL 106 09/20/2020 1210   CO2 28 02/10/2020 1055   GLUCOSE 141 (H) 09/20/2020 1210   BUN 13  09/20/2020 1210   CREATININE 0.80 09/20/2020 1210   CREATININE 0.88 02/10/2020 1055   CALCIUM 10.4 (H) 02/10/2020 1055   PROT 7.0 02/10/2020 1055   ALBUMIN 4.6 12/22/2018 0942   AST 18 02/10/2020 1055   ALT 18 02/10/2020 1055   ALKPHOS 102 12/22/2018 0942   BILITOT 0.8 02/10/2020 1055   GFRNONAA 78.86 05/30/2009 0943   GFRAA  11/21/2008 2144    >60        The eGFR has been calculated using the MDRD equation. This calculation has not been validated in all clinical situations. eGFR's persistently <60 mL/min signify possible Chronic Kidney Disease.      Lab Results  Component Value Date   TSH 1.73 02/10/2020     RADIOGRAPHY: CT MAXILLOFACIAL W CONTRAST  Result Date: 09/12/2020 CLINICAL DATA:  Left parotid mass. Creatinine was obtained on site at [facility] at [address] Results: Creatinine 0.7 mg/dL. EXAM: CT MAXILLOFACIAL WITH CONTRAST TECHNIQUE: Multidetector CT imaging of the maxillofacial structures was performed with intravenous contrast. Multiplanar CT image reconstructions were also generated. CONTRAST:  21m ISOVUE-300 IOPAMIDOL (ISOVUE-300) INJECTION 61% COMPARISON:  Head CT 04/17/2009. Head MRI 12/02/2008. Head CTA 11/13/2008. FINDINGS: Osseous: Left pterional craniotomy. No acute fracture or suspicious osseous lesion. Orbits: Unremarkable. Sinuses: Paranasal sinuses and mastoid air cells are clear. Soft tissues: There is a suspected heterogeneously enhancing mass superficially in the superior aspect of the left parotid gland which is partially obscured by dental streak artifact. The mass is estimated to measure 2.0 x 2.3 x 2.3 cm with evidence of extension laterally into the subcutaneous and possibly cutaneous tissues. No definite parotid mass was present in 2010. The right parotid gland and included portions of the submandibular glands are unremarkable. No enlarged lymph nodes are identified in the included upper portion of the neck. Limited intracranial:  Hypodensities in the cerebral white matter bilaterally, nonspecific and incompletely evaluated though most often seen with chronic small vessel ischemia. IMPRESSION: Approximately 2.3 cm left parotid mass, suboptimally evaluated due to artifact but concerning for possible primary parotid malignancy or a malignant lymph node. Electronically Signed   By: ALogan BoresM.D.   On: 09/12/2020 15:43      IMPRESSION/PLAN:  This is a delightful patient with head and neck cancer. I do recommend radiotherapy for this patient adjuvantly, 6.5 wk course to the  left parotid bed, ipsilateral neck nodes, and distal facial nerve.  We discussed the potential risks, benefits, and side effects of radiotherapy. We talked in detail about acute and late effects. We discussed that some of the most bothersome acute effects may be mucositis, dysgeusia, salivary changes, skin irritation, hair loss, dehydration, weight loss and fatigue. We talked about late effects which include but are not necessarily limited to nerve injury, brain injury, hearing loss, vascular injury, spinal cord injury, xerostomia, and potential injury to any of the tissues in the head and neck region. No guarantees of treatment were given. A consent form was signed and placed in the patient's medical record. The patient is enthusiastic about proceeding with treatment. I look forward to participating in the patient's care.    Simulation (treatment planning) will take place after clearance by dentistry and staging scans. If PET cannot be scheduled as ordered by Dr Constance Holster we will pursue CT neck/ chest for patient.  We also discussed that the treatment of head and neck cancer is a multidisciplinary process to maximize treatment outcomes and quality of life. For this reason the following referrals have been or will be made:   Dentistry for dental evaluation, advice on reducing risk of cavities  or other oral issues.   Nutritionist for nutrition support during and  after treatment.   Baseline audiology testing due to risk of hearing loss from radiotherapy as we treat the facial nerve pathway.  On date of service, in total, I spent 45 minutes on this encounter. Patient was seen in person.  __________________________________________   Eppie Gibson, MD

## 2020-10-03 ENCOUNTER — Telehealth: Payer: Self-pay | Admitting: Radiation Oncology

## 2020-10-03 ENCOUNTER — Encounter: Payer: Self-pay | Admitting: Nutrition

## 2020-10-03 NOTE — Telephone Encounter (Signed)
Scheduled appt per 4/18 sch msg. Called pt, no answer. Left msg with appt date and time.  

## 2020-10-03 NOTE — Progress Notes (Signed)
Patient called to cancel nutrition appointment and reschedule for about 5-6 weeks from now before he begins radiation treatments. Scheduling notified.

## 2020-10-04 ENCOUNTER — Encounter: Payer: Self-pay | Admitting: Radiation Oncology

## 2020-10-04 ENCOUNTER — Encounter (HOSPITAL_COMMUNITY): Payer: Self-pay

## 2020-10-04 ENCOUNTER — Ambulatory Visit (HOSPITAL_COMMUNITY): Admission: RE | Admit: 2020-10-04 | Payer: Medicare Other | Source: Ambulatory Visit

## 2020-10-04 DIAGNOSIS — C7989 Secondary malignant neoplasm of other specified sites: Secondary | ICD-10-CM | POA: Insufficient documentation

## 2020-10-06 ENCOUNTER — Ambulatory Visit (HOSPITAL_COMMUNITY)
Admission: RE | Admit: 2020-10-06 | Discharge: 2020-10-06 | Disposition: A | Discharge status: Home / Self Care | Payer: Medicare Other | Source: Ambulatory Visit | Attending: Otolaryngology | Admitting: Otolaryngology

## 2020-10-06 ENCOUNTER — Other Ambulatory Visit: Payer: Self-pay

## 2020-10-06 DIAGNOSIS — D110 Benign neoplasm of parotid gland: Secondary | ICD-10-CM | POA: Diagnosis not present

## 2020-10-06 DIAGNOSIS — C07 Malignant neoplasm of parotid gland: Secondary | ICD-10-CM | POA: Insufficient documentation

## 2020-10-06 LAB — GLUCOSE, CAPILLARY: Glucose-Capillary: 111 mg/dL — ABNORMAL HIGH (ref 70–99)

## 2020-10-06 MED ORDER — FLUDEOXYGLUCOSE F - 18 (FDG) INJECTION
7.6000 | Freq: Once | INTRAVENOUS | Status: AC
  Administered 2020-10-06: 8.7 via INTRAVENOUS

## 2020-10-09 ENCOUNTER — Encounter: Payer: Medicare Other | Admitting: Nutrition

## 2020-10-12 DIAGNOSIS — M1711 Unilateral primary osteoarthritis, right knee: Secondary | ICD-10-CM | POA: Diagnosis not present

## 2020-10-13 DIAGNOSIS — C4442 Squamous cell carcinoma of skin of scalp and neck: Secondary | ICD-10-CM | POA: Diagnosis not present

## 2020-10-13 DIAGNOSIS — D044 Carcinoma in situ of skin of scalp and neck: Secondary | ICD-10-CM | POA: Diagnosis not present

## 2020-10-13 DIAGNOSIS — L905 Scar conditions and fibrosis of skin: Secondary | ICD-10-CM | POA: Diagnosis not present

## 2020-10-13 DIAGNOSIS — D485 Neoplasm of uncertain behavior of skin: Secondary | ICD-10-CM | POA: Diagnosis not present

## 2020-10-13 DIAGNOSIS — L820 Inflamed seborrheic keratosis: Secondary | ICD-10-CM | POA: Diagnosis not present

## 2020-10-13 DIAGNOSIS — Z85828 Personal history of other malignant neoplasm of skin: Secondary | ICD-10-CM | POA: Diagnosis not present

## 2020-10-13 DIAGNOSIS — L821 Other seborrheic keratosis: Secondary | ICD-10-CM | POA: Diagnosis not present

## 2020-10-13 DIAGNOSIS — L570 Actinic keratosis: Secondary | ICD-10-CM | POA: Diagnosis not present

## 2020-10-13 DIAGNOSIS — Z872 Personal history of diseases of the skin and subcutaneous tissue: Secondary | ICD-10-CM | POA: Diagnosis not present

## 2020-10-17 ENCOUNTER — Other Ambulatory Visit: Payer: Self-pay

## 2020-10-17 ENCOUNTER — Ambulatory Visit (INDEPENDENT_AMBULATORY_CARE_PROVIDER_SITE_OTHER): Payer: Medicare Other | Admitting: Dentistry

## 2020-10-17 VITALS — BP 146/75 | HR 65 | Temp 98.1°F

## 2020-10-17 DIAGNOSIS — K0510 Chronic gingivitis, plaque induced: Secondary | ICD-10-CM

## 2020-10-17 DIAGNOSIS — K08109 Complete loss of teeth, unspecified cause, unspecified class: Secondary | ICD-10-CM | POA: Diagnosis not present

## 2020-10-17 DIAGNOSIS — K06020 Generalized gingival recession, unspecified: Secondary | ICD-10-CM

## 2020-10-17 DIAGNOSIS — C7989 Secondary malignant neoplasm of other specified sites: Secondary | ICD-10-CM

## 2020-10-17 DIAGNOSIS — K036 Deposits [accretions] on teeth: Secondary | ICD-10-CM | POA: Diagnosis not present

## 2020-10-17 DIAGNOSIS — Z01818 Encounter for other preprocedural examination: Secondary | ICD-10-CM

## 2020-10-17 NOTE — Patient Instructions (Signed)
[place] [facility] Lilyana Lippman B. Addisyn Leclaire, D.M.D. Phone: [phone] Fax: [phone]   It was a pleasure seeing you today!  Please refer to the information below regarding your dental visit with us, and call us should you have any questions or concerns that may come up after you leave.   Thank you for giving us the opportunity to provide care for you.  If there is anything we can do for you, please let us know.    RADIATION THERAPY AND INFORMATION REGARDING YOUR TEETH   . XEROSTOMIA (DRY MOUTH):  Your salivary glands may be in the field of radiation.  Radiation may include all or only part of your salivary glands.  This will cause your saliva to dry up, and you will have a dry mouth.  The dry mouth will be for the rest of your life unless your radiation oncologist tells you otherwise.  Your saliva has many functions: 1. It wets your tongue for speaking. 2. It coats your teeth and the inside of your mouth for easier movement. 3. It helps with chewing and swallowing food. 4. It helps clean away harmful acid and toxic products made by the germs in your mouth, therefore it helps prevent cavities. 5. It kills some germs in your mouth and helps to prevent gum disease. 6. It helps to carry flavor to your taste buds.  Once you have lost your saliva, you will be at higher risk for tooth decay and gum disease.    What can be done to help improve your mouth when there's not enough saliva: >> Your dentist may give a recommendation for CLoSYS.  It will not bring back all of your saliva but may bring back some of it.  Also, your saliva may be thick and ropy or white and foamy.  It will not feel like it use to feel. >> You will need to swish with water every time your mouth feels dry.  YOU CANNOT suck on any cough drops, mints, lemon drops, candy, vitamin C or any other products.  You cannot use anything other than water to make your mouth feel less dry.  If you want to drink  anything else, you have to drink it all at once and brush afterwards.  Be sure to discuss the details of your diet habits with your dentist or hygienist.   . RADIATION CARIES:  This is decay (cavities) that happens very quickly once your mouth is very dry due to radiation therapy.  Normally, cavities take six months to two years to become a problem.  When you have dry mouth, cavities may take as little as eight weeks to cause you a problem.   >>  Dental check-ups every two months are necessary as long as you have a dry mouth. Radiation caries typically, but not always, start at your gum line where it is hard to see the cavity.  It is therefore also hard to fill these cavities adequately.  This high rate of cavities happens because your mouth no longer has saliva and therefore the acid made by the germs starts the decay process.  Whenever you eat anything the germs in your mouth change the food into acid.  The acid then burns a small hole in your tooth.  This small hole is the beginning of a cavity.  If this is not treated then it will grow bigger and become a cavity.  The way to avoid this hole getting bigger is to use fluoride every   evening as prescribed by your dentist following your radiation.   NOTE:  You have to make sure that your teeth are very clean before you use the fluoride.  This fluoride in turn will strengthen your teeth and prepare them for another day of fighting acid. >> If you develop radiation caries many times, the damage is so large that you will have to have all your teeth removed.  This could be a big problem if some of these teeth are in the field of radiation.  Further details of why this could be a big problem will follow (see Osteoradionecrosis below).   . DYSGEUSIA (LOSS OF TASTE):  This happens to varying degrees once you've had radiation therapy to your jaw region.  Many times taste is not completely lost, but becomes limited.  The loss of taste is mostly due to radiation  affecting your taste buds.  However, if you have no saliva in your mouth to carry the flavor to your taste buds, it would be difficult for your taste buds to taste anything.  That is why using water or a prescription for Sal[place] prior to meals and during meal times may help with some of the taste.  Keep in mind that taste generally returns very slowly over the course of several months or several years after radiation therapy.  Don't give up hope.   . TRISMUS (LIMITED JAW OPENING):  According to your radiation oncologist, your TMJ or jaw joints are going to be partially or fully in the field of radiation.  This means that over time the muscles that help you open and close your mouth may get stiff.  This will potentially result in your not being able to open your mouth wide enough or as wide as you can open it now.    Let me give you an example of how slowly this happens and how unaware people are of it:   >>  A gentlemen that had radiation therapy two years ago came back to me complaining that bananas are just too large for him to be able to fit them in between his teeth.  He was not able to open wide enough to bite into a banana.  This happens slowly and over a period of time.  What we do to try and prevent this:   1. Your dentist will probably give you a stack of sticks called a trismus exercise device.  This stack will help remind your muscles and your jaw joints to open up to the same distance every day.  Use these sticks every morning when you wake up, or according to the instructions given by your dentist.    2. You must use these sticks for at least one to two years after radiation therapy.  The reason for that is because it happens so slowly and keeps going on for about two years after radiation therapy.  Your hospital dentist will help you monitor your mouth opening and make sure that it's not getting smaller after radiation.  Trismus Exercises: >>  Using the stack of sticks given to you by your  dentist, place the stack in your mouth and hold onto the other end for support. >>  Leave the sticks in your mouth while holding the other end.  Allow 30 seconds for muscle stretching. >>  Rest for a few seconds. >>  Repeat 3-5 times. >>  This exercise is recommended in the mornings and evenings unless otherwise instructed. >>  The exercise should be   done for a period of 2 YEARS after the end of radiation. >>  Your maximum jaw opening should be checked routinely at recall dental visits by your general dentist. >>  You should report any changes, soreness, or difficulties encountered when doing the exercises to your dentist.   . OSTEORADIONECROSIS (ORN):  This is a condition where your jaw bone after radiation therapy becomes very dry.  It has very little blood supply to keep it alive.  If you develop a cavity that turns into an abscess or an infection, then the jaw bone does not have enough blood supply to help fight the infection.  At this point it is very likely that the infection could cause the death of your jaw bone.  When you have dead bone it has to be removed.  Therefore, you might end up having to have surgery to remove part of your jaw bone, the part of the jaw bone that has been affected.     >>  Healing is also a problem if you are to have surgery (like a tooth extraction) in the areas where the bone has had radiation therapy.  If you have surgery, you need more blood supply to heal which is not available.  When blood supply and oxygen are not available, there is a chance for the bone to die. >>  Occasionally, ORN happens on its own with no obvious reason, but this is quite rare.  We believe that patients who continue to smoke and/or drink alcohol have a higher chance of having this problem. >>  Once your jaw bone has had radiation therapy, if there are any remaining teeth in that area, it is not recommended to have them pulled unless your dentist or oral surgeon is aware of your history of  radiation and believes it is safe.  >>  The risks for ORN either from infection or spontaneously occurring (with no reason) are life long.   QUESTIONS?  Call our office during office hours [phone].    

## 2020-10-17 NOTE — Progress Notes (Signed)
Department of Dental Medicine     OUTPATIENT CONSULTATION  Service Date:   10/17/2020  Patient Name:  Shaun Mcmillan Date of Birth:   08/29/1940 Medical Record Number: [id number]  Referring Provider:             Eppie Gibson, MD   PLAN & RECOMMENDATIONS   > There are no current signs of acute dental infection including abscess, edema or erythema, or suspicious lesion requiring biopsy.  The patient does have a dentist that he sees regularly. >> Recommend the patient return s/p radiation therapy for a follow-up visit in our clinic and then return to his primary dentist for routine care. >>> Plan to discuss with medical team and coordinate treatment as needed.  Appointment scheduled for 5/6 to deliver scatter guards.  There are no recommendations for dental intervention prior to starting radiation therapy at this time.  >>  Discussed in detail all treatment options with the patient and they are agreeable to the plan.   Thank you for consulting with [facility] and for the opportunity to participate in this patient's treatment.  Should you have any questions or concerns, please contact the [facility] at [phone].  10/17/2020      CONSULT NOTE   COVID 19 SCREENING: The patient denies symptoms concerning for COVID-19 infection including fever, chills, cough, or newly developed shortness of breath.   HISTORY OF PRESENT ILLNESS: >> Shaun Mcmillan is a very pleasant 80 y.o. 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  The patient presents today for a medically necessary dental consultation as part of their pre-radiation work-up.  DENTAL HISTORY: > The patient reports that he does have a dentist that he sees regularly.  His last visit was in March 2022 for a cleaning and routine check-up.  He currently denies any dental/orofacial  pain or sensitivity.  He does endorse numbness on the left side after his surgery due to the proximity of the facial nerve. >> Patient is able to manage oral secretions.  Patient denies dysphagia, odynophagia, dysphonia, SOB and neck pain.  Patient denies fever, rigors and malaise.   CHIEF COMPLAINT:  Here for a pre-radiation dental evaluation.   Patient Active Problem List   Diagnosis Date Noted  . 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 Metastatic squamous cell carcinoma ([place]) 09/20/2020  . Type II diabetes mellitus with complication ([place]) 12/15/1599  . Gout 09/11/2016  . Glaucoma 09/01/2014  . Hearing loss 03/23/2012  . Tinnitus 03/23/2012  . GRIEF REACTION 05/31/2010  . SHINGLES 05/01/2009  . 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 B12 deficiency 06/22/2007  . Vitamin D deficiency 06/22/2007  . Dyslipidemia 04/20/2007  . Essential hypertension 04/15/2007  . COLONIC POLYPS, HX OF 04/15/2007  . NEPHROLITHIASIS, HX OF 04/15/2007   Past Medical History:  Diagnosis Date  . Arthritis   . Diabetes mellitus type II   . Glaucoma    sees Dr. Ellie Lunch   . History of kidney stones    passed  . Hx of colonic polyps   . Hyperlipidemia   . Hypertension   . Migraines   . Shingles 04/2010   left leg  . 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 APPENDECTOMY    . colonscopy  03/12/2016  per Dr. Watt Climes, benign polyps, repeat in 5 yrs  . left craniotomy  11/03/08   per Dr. Sherley Bounds for a subdrual hematoma  . PAROTIDECTOMY Left 09/20/2020   Procedure: PAROTIDECTOMY with resection of skin and rotational flap;  Surgeon: Izora Gala, MD;  Location: [facility];  Service: ENT;  Laterality: Left;  . surgery for ocmpound fracture     right leg   No Known Allergies Current Outpatient Medications  Medication Sig Dispense Refill  . acetaminophen (TYLENOL) 500 MG tablet Take  1,000 mg by mouth every 8 (eight) hours as needed for moderate pain.    Marland Kitchen amLODipine (NORVASC) 10 MG tablet Take 1 tablet (10 mg total) by mouth daily. 90 tablet 3  . aspirin EC 81 MG tablet Take 1 tablet (81 mg total) by mouth daily. Swallow whole. 30 tablet 11  . atorvastatin (LIPITOR) 40 MG tablet Take 1 tablet (40 mg total) by mouth daily. 90 tablet 3  . Cholecalciferol (VITAMIN D PO) Take 5,000 Units by mouth daily.    . diclofenac (VOLTAREN) 75 MG EC tablet Take 1 tablet (75 mg total) by mouth 2 (two) times daily. (Patient not taking: Reported on 09/15/2020) 60 tablet 0  . latanoprost (XALATAN) 0.005 % ophthalmic solution Place 1 drop into both eyes every morning.    Marland Kitchen LORazepam (ATIVAN) 1 MG tablet Take 1 tablet (1 mg total) by mouth 3 (three) times daily as needed for anxiety. 60 tablet 5  . Melatonin 5 MG TABS Take 5 mg by mouth at bedtime.    . metFORMIN (GLUCOPHAGE) 500 MG tablet TAKE 1 TABLET BY MOUTH  TWICE DAILY WITH A MEAL (Patient taking differently: Take 500 mg by mouth 2 (two) times daily with a meal. TAKE 1 TABLET BY MOUTH  TWICE DAILY WITH A MEAL) 180 tablet 3  . Multiple Vitamin (MULTIVITAMIN) tablet Take 1 tablet by mouth daily.    Marland Kitchen ofloxacin (FLOXIN) 0.3 % OTIC solution Place 5 drops into the left ear in the morning and at bedtime.    . ramipril (ALTACE) 10 MG capsule TAKE 1 CAPSULE BY MOUTH  TWICE DAILY (Patient taking differently: Take 10 mg by mouth 2 (two) times daily. TAKE 1 CAPSULE BY MOUTH  TWICE DAILY) 180 capsule 3  . timolol (BETIMOL) 0.5 % ophthalmic solution Place 1 drop into both eyes daily.    . traMADol (ULTRAM) 50 MG tablet Take 2 tablets (100 mg total) by mouth every 6 (six) hours as needed for moderate pain. (Patient not taking: Reported on 09/15/2020) 60 tablet 1   No current facility-administered medications for this visit.    LABS: Lab Results  Component Value Date   WBC 9.9 02/10/2020   HGB 14.3 09/20/2020   HCT 42.0 09/20/2020   MCV 87.3 02/10/2020    PLT 279 02/10/2020      Component Value Date/Time   NA 143 09/20/2020 1210   K 3.6 09/20/2020 1210   CL 106 09/20/2020 1210   CO2 28 02/10/2020 1055   GLUCOSE 141 (H) 09/20/2020 1210   BUN 13 09/20/2020 1210   CREATININE 0.80 09/20/2020 1210   CREATININE 0.88 02/10/2020 1055   CALCIUM 10.4 (H) 02/10/2020 1055   GFRNONAA 78.86 05/30/2009 0943   GFRAA  11/21/2008 2144    >60        The eGFR has been calculated using the MDRD equation. This calculation has not been validated in all clinical situations. eGFR's persistently <60 mL/min signify possible Chronic Kidney Disease.  Lab Results  Component Value Date   INR 1.1 11/13/2008   No results found for: PTT  Social History   Socioeconomic History  . Marital status: Married    Spouse name: Not on file  . Number of children: Not on file  . Years of education: Not on file  . Highest education level: Not on file  Occupational History  . Not on file  Tobacco Use  . Smoking status: Never Smoker  . Smokeless tobacco: Never Used  Vaping Use  . Vaping Use: Never used  Substance and Sexual Activity  . Alcohol use: Not Currently    Alcohol/week: 1.0 standard drink    Types: 1 Standard drinks or equivalent per week  . Drug use: No  . Sexual activity: Not on file  Other Topics Concern  . Not on file  Social History Narrative  . Not on file   Social Determinants of Health   Financial Resource Strain: Low Risk   . Difficulty of Paying Living Expenses: Not hard at all  Food Insecurity: No Food Insecurity  . Worried About Charity fundraiser in the Last Year: Never true  . Ran Out of Food in the Last Year: Never true  Transportation Needs: No Transportation Needs  . Lack of Transportation (Medical): No  . Lack of Transportation (Non-Medical): No  Physical Activity: Sufficiently Active  . Days of Exercise per Week: 4 days  . Minutes of Exercise per Session: 40 min  Stress: No Stress Concern Present  . Feeling of  Stress : Not at all  Social Connections: Moderately Integrated  . Frequency of Communication with Friends and Family: Not on file  . Frequency of Social Gatherings with Friends and Family: Three times a week  . Attends Religious Services: More than 4 times per year  . Active Member of Clubs or Organizations: No  . Attends Archivist Meetings: Never  . Marital Status: Married  Human resources officer Violence: Not At Risk  . Fear of Current or Ex-Partner: No  . Emotionally Abused: No  . Physically Abused: No  . Sexually Abused: No   Family History  Problem Relation Age of Onset  . Heart disease Other     REVIEW OF SYSTEMS: Reviewed with the patient as per HPI. PSYCH: Patient denies having dental phobia.   VITAL SIGNS: BP (!) 146/75 (BP Location: Right Arm)   Pulse 65   Temp 98.1 F (36.7 C) (Oral)    PHYSICAL EXAM: >> General:  Well-developed, comfortable and in no apparent distress. >> Neurological:  Alert and oriented to person, place and  time. >> Extraoral:  Facial asymmetry present without any edema or erythema.  No swelling or lymphadenopathy.  TMJ asymptomatic without clicks or crepitations. (+) Left side facial droop s/p parotidectomy >> Maximum Interincisal Opening: 35 mm >> Intraoral:  Soft tissues appear well-perfused and mucous membranes moist.  FOM and vestibules soft and not raised. Oral cavity without mass or lesion. No signs of infection, parulis, sinus tract, edema or erythema evident upon exam.   DENTAL EXAM: Hard tissue exam completed and charted.  >> Dentition:  Overall good remaining dentition.  Missing teeth, existing restorations/crowns. >> The patient is maintaining good oral hygiene.  >> Periodontal: Pink, healthy gingival tissue with blunted papilla. Localized calculus accumulation on mandibular anterior teeth. Generalized gingival recession. >> Endodontics:  #10 and #18 previously root canal treated with definitive crowns. >> Removable/Fixed  Prosthodontics: #2, #3, #14, #18, #19, #20, #21, #30 and #31  existing PFM crowns; #10 existing all ceramic crown. >> Occlusion: Class I molar occlusion   RADIOGRAPHIC EXAM: PAN and Full Mouth Series exposed and interpreted.  >> Condyles seated bilaterally in fossas.  No evidence of abnormal pathology.  All visualized osseous structures appear WNL. Radiopaque region on the left side superimposing on the left condyle consistent with recent parotidectomy.  >> Generalized mild horizontal bone loss consistent with mild periodontitis vs gingival recession on healthy periodontium. Slight radiographic calculus accumulation present.    >> Missing teeth, existing restorations and multiple full-coverage crowns. #10 and #18 previous endodontic treatment; #10 gutta percha filled to ~2 mm short of apex, #18 distal root gutta percha ~2 mm short of apex and mesial root gutta percha appears short ~ 1/2 of root filled.   ASSESSMENT:  1. Metastatic SCC to parotid gland 2. Preoperative dental consultation 3. Missing teeth 4. Accretions on teeth 5. Gingivitis 6. Gingival recession, generalized   PROCEDURES: 1. Upper and Lower alginate impressions taken and poured up in Type IV Microstone for fabrication of scatter protection devices.  2. Trismus appliance made using patient's baseline MIO (20 sticks).  Leta Speller, DAII demonstrated use of appliance.  Verbal and written postop instructions were given to the patient. 3. The common and significant side effects of radiation therapy to the head and neck were explained and discussed with the patient.  The discussion included side effects of trismus (limited opening), dysgeusia (loss of taste), xerostomia (dry mouth), radiation caries and osteoradionecrosis of the jaw.  I also discussed the importance of maintaining optimal oral hygiene and oral health before, during and after radiation to decrease the risk of developing radiation cavities and the need for any  surgery such as extractions after therapy.     PLAN AND RECOMMENDATIONS: >  I discussed the risks, benefits, and complications of various scenarios with the patient in relationship to their medical and dental conditions, which included systemic infection or osteoradionecrosis that could potentially occur either before, during or after their anticipated therapy if dental/oral concerns are not addressed.  I explained that if any chronic or acute dental/oral infection(s) are addressed and subsequently not maintained following medical optimization and recovery, their risk of the previously mentioned complications are just as high and could potentially occur postoperatively.  I explained all significant findings of the dental consultation,  and the recommended care including continuing to see his regular dentist every 6 mos following the completion of radiation therapy in order to optimize them for head and neck radiation from a dental standpoint.  The patient verbalized understanding of all findings, discussion, and recommendations. >>  We then discussed various treatment options to include no treatment, multiple extractions with alveoloplasty, pre-prosthetic surgery as indicated, periodontal therapy, dental restorations, root canal therapy, crown and bridge therapy, implant therapy, and replacement of missing teeth as indicated.  The patient verbalized understanding of all options, and currently wishes to proceed with returning to his regular dentist after he has completed radiation and a follow-up visit in our dental clinic. >>>  Plan to discuss all findings and recommendations with medical team and coordinate future care as needed.  Scheduled appointment for delivery of scatter protection devices for Friday 5/6 prior to his simulation (not yet scheduled).   <> The patient tolerated today's visit well.  All questions and concerns were addressed and answered, and the patient departed in stable condition.  I  spent in excess of 120 minutes during the conduct of this consultation and >50% of this time  involved direct face-to-face encounter for counseling and/or coordination of the patient's care. Shattuck Benson Norway, D.M.D.

## 2020-10-20 ENCOUNTER — Ambulatory Visit (INDEPENDENT_AMBULATORY_CARE_PROVIDER_SITE_OTHER): Payer: Medicare Other | Admitting: Dentistry

## 2020-10-20 ENCOUNTER — Other Ambulatory Visit: Payer: Self-pay

## 2020-10-20 VITALS — BP 134/67 | HR 67 | Temp 98.0°F

## 2020-10-20 DIAGNOSIS — Z01818 Encounter for other preprocedural examination: Secondary | ICD-10-CM

## 2020-10-20 DIAGNOSIS — C7989 Secondary malignant neoplasm of other specified sites: Secondary | ICD-10-CM

## 2020-10-20 DIAGNOSIS — C801 Malignant (primary) neoplasm, unspecified: Secondary | ICD-10-CM

## 2020-10-22 NOTE — Progress Notes (Signed)
Department of Dental Medicine     DELIVERY APPOINTMENT  Service Date:   10/22/2020  Patient Name:  Shaun Mcmillan Date of Birth:   22-Jun-1940 Medical Record Number: [id number]  Referring Provider:              Eppie Gibson, MD   PLAN & RECOMMENDATIONS   > Delivered upper and lower scatter protection devices today. >> Plan to follow-up with the patient s/p radiation therapy and then have him return to his regular dentist for routine dental care.  >>  Discussed in detail all treatment options with the patient and they are agreeable to the plan.   10/22/2020     PROGRESS NOTE   COVID 19 SCREENING: The patient denies symptoms concerning for COVID-19 infection including fever, chills, cough, or newly developed shortness of breath.   HISTORY OF PRESENT ILLNESS: > Shaun Mcmillan presents today for upper and lower scatter protection device delivery. >> Medical and dental history reviewed with the patient.  Simulation date is still pending.  CHIEF COMPLAINT:  Patient with no complaints.   Patient Active Problem List   Diagnosis Date Noted  . 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 Metastatic squamous cell carcinoma ([place]) 09/20/2020  . Type II diabetes mellitus with complication ([place]) 33/82/5053  . Gout 09/11/2016  . Glaucoma 09/01/2014  . Hearing loss 03/23/2012  . Tinnitus 03/23/2012  . GRIEF REACTION 05/31/2010  . SHINGLES 05/01/2009  . 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 B12 deficiency 06/22/2007  . Vitamin D deficiency 06/22/2007  . Dyslipidemia 04/20/2007  . Essential hypertension 04/15/2007  . COLONIC POLYPS, HX OF 04/15/2007  . NEPHROLITHIASIS, HX OF 04/15/2007   Past Medical History:  Diagnosis Date  . Arthritis   . Diabetes mellitus type II   . Glaucoma    sees Dr. Ellie Lunch   . History of kidney stones    passed  . Hx of colonic polyps   . Hyperlipidemia   . Hypertension   . Migraines   . Shingles 04/2010   left  leg  . 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 acetaminophen (TYLENOL) 500 MG tablet Take 1,000 mg by mouth every 8 (eight) hours as needed for moderate pain.    Marland Kitchen amLODipine (NORVASC) 10 MG tablet Take 1 tablet (10 mg total) by mouth daily. 90 tablet 3  . aspirin EC 81 MG tablet Take 1 tablet (81 mg total) by mouth daily. Swallow whole. 30 tablet 11  . atorvastatin (LIPITOR) 40 MG tablet Take 1 tablet (40 mg total) by mouth daily. 90 tablet 3  . Cholecalciferol (VITAMIN D PO) Take 5,000 Units by mouth daily.    . diclofenac (VOLTAREN) 75 MG EC tablet Take 1 tablet (75 mg total) by mouth 2 (two) times daily. (Patient not taking: Reported on 09/15/2020) 60 tablet 0  . latanoprost (XALATAN) 0.005 % ophthalmic solution Place 1 drop into both eyes every morning.    Marland Kitchen LORazepam (ATIVAN) 1 MG tablet Take 1 tablet (1 mg total) by mouth 3 (three) times daily as needed for anxiety. 60 tablet 5  . Melatonin 5 MG TABS Take 5 mg by mouth at bedtime.    . metFORMIN (GLUCOPHAGE) 500 MG tablet TAKE 1 TABLET BY MOUTH  TWICE DAILY WITH A MEAL (Patient taking differently: Take 500 mg by mouth 2 (two) times daily with a meal. TAKE 1 TABLET  BY MOUTH  TWICE DAILY WITH A MEAL) 180 tablet 3  . Multiple Vitamin (MULTIVITAMIN) tablet Take 1 tablet by mouth daily.    Marland Kitchen ofloxacin (FLOXIN) 0.3 % OTIC solution Place 5 drops into the left ear in the morning and at bedtime.    . ramipril (ALTACE) 10 MG capsule TAKE 1 CAPSULE BY MOUTH  TWICE DAILY (Patient taking differently: Take 10 mg by mouth 2 (two) times daily. TAKE 1 CAPSULE BY MOUTH  TWICE DAILY) 180 capsule 3  . timolol (BETIMOL) 0.5 % ophthalmic solution Place 1 drop into both eyes daily.    . traMADol (ULTRAM) 50 MG tablet Take 2 tablets (100 mg total) by mouth every 6 (six) hours as needed for moderate pain. (Patient not taking: Reported on 09/15/2020) 60 tablet 1   No  current facility-administered medications for this visit.   No Known Allergies   VITALS: BP 134/67 (BP Location: Right Arm)   Pulse 67   Temp 98 F (36.7 C) (Oral)    ASSESSMENT: Patient with anticipated radiation therapy to the head and neck.  1. Metastatic SCC to parotid gland   PROCEDURES: 1. Appliances were tried in and adjusted as needed. Polished. 2. Postoperative instructions were provided in a written and verbal format concerning the use and care of appliances.   PLAN: > Patient to return to our clinic following the completion of radiation therapy for a follow-up appointment.  He will then return to his regular dentist for routine dental care including cleanings and exams. >> Patient instructed to call if questions or problems arise before then.   <>  All questions and concerns were invited and addressed.  The patient tolerated today's visit well and departed in stable condition.  Blanchard Benson Norway, DMD

## 2020-10-24 ENCOUNTER — Other Ambulatory Visit: Payer: Self-pay

## 2020-10-24 ENCOUNTER — Ambulatory Visit
Admission: RE | Admit: 2020-10-24 | Discharge: 2020-10-24 | Disposition: A | Discharge status: Home / Self Care | Payer: Medicare Other | Source: Ambulatory Visit | Attending: Radiation Oncology | Admitting: Radiation Oncology

## 2020-10-24 DIAGNOSIS — C07 Malignant neoplasm of parotid gland: Secondary | ICD-10-CM | POA: Diagnosis not present

## 2020-10-24 DIAGNOSIS — Z510 Encounter for antineoplastic radiation therapy: Secondary | ICD-10-CM | POA: Diagnosis not present

## 2020-10-27 DIAGNOSIS — H401112 Primary open-angle glaucoma, right eye, moderate stage: Secondary | ICD-10-CM | POA: Diagnosis not present

## 2020-10-27 DIAGNOSIS — H02105 Unspecified ectropion of left lower eyelid: Secondary | ICD-10-CM | POA: Diagnosis not present

## 2020-10-27 DIAGNOSIS — H401123 Primary open-angle glaucoma, left eye, severe stage: Secondary | ICD-10-CM | POA: Diagnosis not present

## 2020-10-30 ENCOUNTER — Telehealth: Payer: Self-pay | Admitting: Family Medicine

## 2020-10-30 NOTE — Progress Notes (Signed)
  Chronic Care Management   Note  10/30/2020 Name: Shaun Mcmillan MRN: [id number] DOB: July 04, 1940  Shaun Mcmillan is a 80 y.o. year old male who is a primary care patient of [name], MD. I reached out to Shaun Mcmillan by phone today in response to a referral sent by [name]'s PCP, [name], MD.   Shaun Mcmillan was given information about Chronic Care Management services today including:  1. CCM service includes personalized support from designated clinical staff supervised by his physician, including individualized plan of care and coordination with other care providers 2. 24/7 contact phone numbers for assistance for urgent and routine care needs. 3. Service will only be billed when office clinical staff spend 20 minutes or more in a month to coordinate care. 4. Only one practitioner may furnish and bill the service in a calendar month. 5. The patient may stop CCM services at any time (effective at the end of the month) by phone call to the office staff.   Patient agreed to services and verbal consent obtained.   Follow up plan:   Carley Perdue [employer] Scheduler

## 2020-11-01 ENCOUNTER — Ambulatory Visit
Admission: RE | Admit: 2020-11-01 | Discharge: 2020-11-01 | Disposition: A | Discharge status: Home / Self Care | Payer: Medicare Other | Source: Ambulatory Visit | Attending: Radiation Oncology | Admitting: Radiation Oncology

## 2020-11-01 ENCOUNTER — Other Ambulatory Visit: Payer: Self-pay

## 2020-11-01 DIAGNOSIS — Z510 Encounter for antineoplastic radiation therapy: Secondary | ICD-10-CM | POA: Diagnosis not present

## 2020-11-01 DIAGNOSIS — C07 Malignant neoplasm of parotid gland: Secondary | ICD-10-CM | POA: Diagnosis not present

## 2020-11-02 ENCOUNTER — Ambulatory Visit
Admission: RE | Admit: 2020-11-02 | Discharge: 2020-11-02 | Disposition: A | Discharge status: Home / Self Care | Payer: Medicare Other | Source: Ambulatory Visit | Attending: Radiation Oncology | Admitting: Radiation Oncology

## 2020-11-02 ENCOUNTER — Other Ambulatory Visit: Payer: Self-pay

## 2020-11-02 DIAGNOSIS — Z510 Encounter for antineoplastic radiation therapy: Secondary | ICD-10-CM | POA: Diagnosis not present

## 2020-11-02 DIAGNOSIS — C07 Malignant neoplasm of parotid gland: Secondary | ICD-10-CM | POA: Diagnosis not present

## 2020-11-03 ENCOUNTER — Ambulatory Visit
Admission: RE | Admit: 2020-11-03 | Discharge: 2020-11-03 | Disposition: A | Discharge status: Home / Self Care | Payer: Medicare Other | Source: Ambulatory Visit | Attending: Radiation Oncology | Admitting: Radiation Oncology

## 2020-11-03 DIAGNOSIS — C07 Malignant neoplasm of parotid gland: Secondary | ICD-10-CM | POA: Diagnosis not present

## 2020-11-03 DIAGNOSIS — Z510 Encounter for antineoplastic radiation therapy: Secondary | ICD-10-CM | POA: Diagnosis not present

## 2020-11-06 ENCOUNTER — Ambulatory Visit
Admission: RE | Admit: 2020-11-06 | Discharge: 2020-11-06 | Disposition: A | Discharge status: Home / Self Care | Payer: Medicare Other | Source: Ambulatory Visit | Attending: Radiation Oncology | Admitting: Radiation Oncology

## 2020-11-06 ENCOUNTER — Other Ambulatory Visit: Payer: Self-pay

## 2020-11-06 DIAGNOSIS — Z510 Encounter for antineoplastic radiation therapy: Secondary | ICD-10-CM | POA: Diagnosis not present

## 2020-11-06 DIAGNOSIS — C7989 Secondary malignant neoplasm of other specified sites: Secondary | ICD-10-CM

## 2020-11-06 DIAGNOSIS — C07 Malignant neoplasm of parotid gland: Secondary | ICD-10-CM | POA: Diagnosis not present

## 2020-11-06 MED ORDER — SONAFINE EX EMUL
1.0000 | Freq: Two times a day (BID) | CUTANEOUS | Status: DC
Start: 2020-11-06 — End: 2020-11-07
  Administered 2020-11-06: 1 via TOPICAL

## 2020-11-06 NOTE — Progress Notes (Signed)
Pt here for patient teaching.  Completed by Donna McIntyre-RN  Pt given Radiation and You booklet, Managing Acute Radiation Side Effects for Head and Neck Cancer handout, skin care instructions and Sonafine.    Reviewed areas of pertinence such as fatigue, hair loss, mouth changes, skin changes, throat changes, earaches and taste changes .   Pt able to give teach back of to pat skin, use unscented/gentle soap and drink plenty of water,apply Sonafine bid, avoid applying anything to skin within 4 hours of treatment and to use an electric razor if they must shave.   Pt demonstrated understanding and verbalizes understanding of information given and will contact nursing with any questions or concerns.    [email or web address].org/treatmentinformation/whattoexpect/index          

## 2020-11-07 ENCOUNTER — Ambulatory Visit
Admission: RE | Admit: 2020-11-07 | Discharge: 2020-11-07 | Disposition: A | Discharge status: Home / Self Care | Payer: Medicare Other | Source: Ambulatory Visit | Attending: Radiation Oncology | Admitting: Radiation Oncology

## 2020-11-07 DIAGNOSIS — C07 Malignant neoplasm of parotid gland: Secondary | ICD-10-CM | POA: Diagnosis not present

## 2020-11-07 DIAGNOSIS — Z510 Encounter for antineoplastic radiation therapy: Secondary | ICD-10-CM | POA: Diagnosis not present

## 2020-11-08 ENCOUNTER — Ambulatory Visit
Admission: RE | Admit: 2020-11-08 | Discharge: 2020-11-08 | Disposition: A | Discharge status: Home / Self Care | Payer: Medicare Other | Source: Ambulatory Visit | Attending: Radiation Oncology | Admitting: Radiation Oncology

## 2020-11-08 ENCOUNTER — Other Ambulatory Visit: Payer: Self-pay

## 2020-11-08 DIAGNOSIS — Z510 Encounter for antineoplastic radiation therapy: Secondary | ICD-10-CM | POA: Diagnosis not present

## 2020-11-08 DIAGNOSIS — C07 Malignant neoplasm of parotid gland: Secondary | ICD-10-CM | POA: Diagnosis not present

## 2020-11-09 ENCOUNTER — Ambulatory Visit
Admission: RE | Admit: 2020-11-09 | Discharge: 2020-11-09 | Disposition: A | Discharge status: Home / Self Care | Payer: Medicare Other | Source: Ambulatory Visit | Attending: Radiation Oncology | Admitting: Radiation Oncology

## 2020-11-09 DIAGNOSIS — Z510 Encounter for antineoplastic radiation therapy: Secondary | ICD-10-CM | POA: Diagnosis not present

## 2020-11-09 DIAGNOSIS — C07 Malignant neoplasm of parotid gland: Secondary | ICD-10-CM | POA: Diagnosis not present

## 2020-11-10 ENCOUNTER — Other Ambulatory Visit: Payer: Self-pay

## 2020-11-10 ENCOUNTER — Ambulatory Visit
Admission: RE | Admit: 2020-11-10 | Discharge: 2020-11-10 | Disposition: A | Discharge status: Home / Self Care | Payer: Medicare Other | Source: Ambulatory Visit | Attending: Radiation Oncology | Admitting: Radiation Oncology

## 2020-11-10 DIAGNOSIS — Z510 Encounter for antineoplastic radiation therapy: Secondary | ICD-10-CM | POA: Diagnosis not present

## 2020-11-10 DIAGNOSIS — C07 Malignant neoplasm of parotid gland: Secondary | ICD-10-CM | POA: Diagnosis not present

## 2020-11-14 ENCOUNTER — Other Ambulatory Visit: Payer: Self-pay

## 2020-11-14 ENCOUNTER — Ambulatory Visit
Admission: RE | Admit: 2020-11-14 | Discharge: 2020-11-14 | Disposition: A | Discharge status: Home / Self Care | Payer: Medicare Other | Source: Ambulatory Visit | Attending: Radiation Oncology | Admitting: Radiation Oncology

## 2020-11-14 ENCOUNTER — Inpatient Hospital Stay: Payer: Medicare Other | Attending: Nutrition | Admitting: Nutrition

## 2020-11-14 DIAGNOSIS — Z510 Encounter for antineoplastic radiation therapy: Secondary | ICD-10-CM | POA: Diagnosis not present

## 2020-11-14 DIAGNOSIS — C07 Malignant neoplasm of parotid gland: Secondary | ICD-10-CM | POA: Diagnosis not present

## 2020-11-14 NOTE — Progress Notes (Signed)
80 year old male diagnosed with parotid cancer followed by Dr. Isidore Moos receiving 33 fractions of radiation therapy.  Last radiation treatment is scheduled for July 5.  Past medical history includes shingles, migraine, hypertension, diabetes type 2.  Medications include Lipitor, vitamin D, Ativan, Glucophage, multivitamin.  Labs were reviewed.  Height: 5 feet 9 inches. Weight: 156 pounds on April 18. Usual body weight: 160 pounds. BMI: 23.09.  Patient presents to nutrition consult with his wife. Reports early satiety but is eating smaller amounts of food at mealtimes and snacks in between.  States he either drinks 1 original boost or 1 high-protein boost a day.  He does not typically drink milk.  He has been consuming soft foods but not very many meats.  Nutrition diagnosis:  Food and nutrition related knowledge deficit related to parotid cancer and associated treatments as evidenced by no prior need for nutrition related information.  Intervention: Educated patient to consume smaller more frequent meals and snacks utilizing high-calorie, high-protein foods.  Encouraged weight maintenance. Reviewed soft high-protein foods and provided nutrition fact sheet. Increase boost Plus 1-2 bottles daily.  Provided coupons. Questions answered.  Teach back method used.  Additional nutrition facts sheets provided.  Monitoring, evaluation, goals: Patient will tolerate increased calories and protein to minimize weight loss.  Next visit: Tuesday, June 14 after radiation therapy.  Patient educated to call RD with questions if needed before next appointment.  **Disclaimer: This note was dictated with voice recognition software. Similar sounding words can inadvertently be transcribed and this note may contain transcription errors which may not have been corrected upon publication of note.**

## 2020-11-15 ENCOUNTER — Ambulatory Visit
Admission: RE | Admit: 2020-11-15 | Discharge: 2020-11-15 | Disposition: A | Discharge status: Home / Self Care | Payer: Medicare Other | Source: Ambulatory Visit | Attending: Radiation Oncology | Admitting: Radiation Oncology

## 2020-11-15 DIAGNOSIS — H401123 Primary open-angle glaucoma, left eye, severe stage: Secondary | ICD-10-CM | POA: Diagnosis not present

## 2020-11-15 DIAGNOSIS — C07 Malignant neoplasm of parotid gland: Secondary | ICD-10-CM | POA: Insufficient documentation

## 2020-11-15 DIAGNOSIS — Z510 Encounter for antineoplastic radiation therapy: Secondary | ICD-10-CM | POA: Diagnosis not present

## 2020-11-16 ENCOUNTER — Ambulatory Visit
Admission: RE | Admit: 2020-11-16 | Discharge: 2020-11-16 | Disposition: A | Discharge status: Home / Self Care | Payer: Medicare Other | Source: Ambulatory Visit | Attending: Radiation Oncology | Admitting: Radiation Oncology

## 2020-11-16 ENCOUNTER — Other Ambulatory Visit: Payer: Self-pay

## 2020-11-16 DIAGNOSIS — Z510 Encounter for antineoplastic radiation therapy: Secondary | ICD-10-CM | POA: Diagnosis not present

## 2020-11-16 DIAGNOSIS — C07 Malignant neoplasm of parotid gland: Secondary | ICD-10-CM | POA: Diagnosis not present

## 2020-11-17 ENCOUNTER — Ambulatory Visit
Admission: RE | Admit: 2020-11-17 | Discharge: 2020-11-17 | Disposition: A | Discharge status: Home / Self Care | Payer: Medicare Other | Source: Ambulatory Visit | Attending: Radiation Oncology | Admitting: Radiation Oncology

## 2020-11-17 DIAGNOSIS — C07 Malignant neoplasm of parotid gland: Secondary | ICD-10-CM | POA: Diagnosis not present

## 2020-11-17 DIAGNOSIS — Z510 Encounter for antineoplastic radiation therapy: Secondary | ICD-10-CM | POA: Diagnosis not present

## 2020-11-20 ENCOUNTER — Other Ambulatory Visit: Payer: Self-pay

## 2020-11-20 ENCOUNTER — Ambulatory Visit
Admission: RE | Admit: 2020-11-20 | Discharge: 2020-11-20 | Disposition: A | Discharge status: Home / Self Care | Payer: Medicare Other | Source: Ambulatory Visit | Attending: Radiation Oncology | Admitting: Radiation Oncology

## 2020-11-20 DIAGNOSIS — C07 Malignant neoplasm of parotid gland: Secondary | ICD-10-CM | POA: Diagnosis not present

## 2020-11-20 DIAGNOSIS — Z510 Encounter for antineoplastic radiation therapy: Secondary | ICD-10-CM | POA: Diagnosis not present

## 2020-11-21 ENCOUNTER — Ambulatory Visit
Admission: RE | Admit: 2020-11-21 | Discharge: 2020-11-21 | Disposition: A | Discharge status: Home / Self Care | Payer: Medicare Other | Source: Ambulatory Visit | Attending: Radiation Oncology | Admitting: Radiation Oncology

## 2020-11-21 DIAGNOSIS — C07 Malignant neoplasm of parotid gland: Secondary | ICD-10-CM | POA: Diagnosis not present

## 2020-11-21 DIAGNOSIS — Z510 Encounter for antineoplastic radiation therapy: Secondary | ICD-10-CM | POA: Diagnosis not present

## 2020-11-22 ENCOUNTER — Ambulatory Visit
Admission: RE | Admit: 2020-11-22 | Discharge: 2020-11-22 | Disposition: A | Discharge status: Home / Self Care | Payer: Medicare Other | Source: Ambulatory Visit | Attending: Radiation Oncology | Admitting: Radiation Oncology

## 2020-11-22 ENCOUNTER — Other Ambulatory Visit: Payer: Self-pay

## 2020-11-22 DIAGNOSIS — C07 Malignant neoplasm of parotid gland: Secondary | ICD-10-CM | POA: Diagnosis not present

## 2020-11-22 DIAGNOSIS — Z510 Encounter for antineoplastic radiation therapy: Secondary | ICD-10-CM | POA: Diagnosis not present

## 2020-11-23 ENCOUNTER — Ambulatory Visit
Admission: RE | Admit: 2020-11-23 | Discharge: 2020-11-23 | Disposition: A | Discharge status: Home / Self Care | Payer: Medicare Other | Source: Ambulatory Visit | Attending: Radiation Oncology | Admitting: Radiation Oncology

## 2020-11-23 DIAGNOSIS — Z510 Encounter for antineoplastic radiation therapy: Secondary | ICD-10-CM | POA: Diagnosis not present

## 2020-11-23 DIAGNOSIS — C07 Malignant neoplasm of parotid gland: Secondary | ICD-10-CM | POA: Diagnosis not present

## 2020-11-24 ENCOUNTER — Other Ambulatory Visit: Payer: Self-pay

## 2020-11-24 ENCOUNTER — Ambulatory Visit
Admission: RE | Admit: 2020-11-24 | Discharge: 2020-11-24 | Disposition: A | Discharge status: Home / Self Care | Payer: Medicare Other | Source: Ambulatory Visit | Attending: Radiation Oncology | Admitting: Radiation Oncology

## 2020-11-24 DIAGNOSIS — Z510 Encounter for antineoplastic radiation therapy: Secondary | ICD-10-CM | POA: Diagnosis not present

## 2020-11-24 DIAGNOSIS — C07 Malignant neoplasm of parotid gland: Secondary | ICD-10-CM | POA: Diagnosis not present

## 2020-11-27 ENCOUNTER — Ambulatory Visit
Admission: RE | Admit: 2020-11-27 | Discharge: 2020-11-27 | Disposition: A | Discharge status: Home / Self Care | Payer: Medicare Other | Source: Ambulatory Visit | Attending: Radiation Oncology | Admitting: Radiation Oncology

## 2020-11-27 ENCOUNTER — Other Ambulatory Visit: Payer: Self-pay

## 2020-11-27 DIAGNOSIS — C07 Malignant neoplasm of parotid gland: Secondary | ICD-10-CM | POA: Diagnosis not present

## 2020-11-27 DIAGNOSIS — Z510 Encounter for antineoplastic radiation therapy: Secondary | ICD-10-CM | POA: Diagnosis not present

## 2020-11-28 ENCOUNTER — Inpatient Hospital Stay: Payer: Medicare Other | Attending: Nutrition | Admitting: Nutrition

## 2020-11-28 ENCOUNTER — Ambulatory Visit
Admission: RE | Admit: 2020-11-28 | Discharge: 2020-11-28 | Disposition: A | Discharge status: Home / Self Care | Payer: Medicare Other | Source: Ambulatory Visit | Attending: Radiation Oncology | Admitting: Radiation Oncology

## 2020-11-28 DIAGNOSIS — C07 Malignant neoplasm of parotid gland: Secondary | ICD-10-CM | POA: Diagnosis not present

## 2020-11-28 DIAGNOSIS — Z510 Encounter for antineoplastic radiation therapy: Secondary | ICD-10-CM | POA: Diagnosis not present

## 2020-11-28 NOTE — Progress Notes (Signed)
Nutrition follow-up completed with patient and wife.  Patient is receiving radiation therapy for parotid cancer.  Last radiation treatment is scheduled for July 5.  Patient denies nutrition impact symptoms other than fatigue.  Reports he does not have sores in his mouth.  He is swallowing without difficulty.  He is using baking soda and salt water rinses once a day.  He is drinking 2 boost plus daily.  Weight decreased 150 pounds on June 13 from 156 pounds on April 18.  Patient states he is trying to eat smaller amounts of food more often.  Nutrition diagnosis: Food and nutrition related knowledge deficit improved.  Intervention: Encouraged him to continue small frequent meals and snacks.   Increase boost plus 3 times daily between meals.  Provided samples and coupons. Encouraged weight maintenance.  Monitoring, evaluation, goals: Patient will tolerate increased calories and protein to minimize weight loss.  Next visit: Thursday, June 30.  **Disclaimer: This note was dictated with voice recognition software. Similar sounding words can inadvertently be transcribed and this note may contain transcription errors which may not have been corrected upon publication of note.**

## 2020-11-29 ENCOUNTER — Ambulatory Visit
Admission: RE | Admit: 2020-11-29 | Discharge: 2020-11-29 | Disposition: A | Discharge status: Home / Self Care | Payer: Medicare Other | Source: Ambulatory Visit | Attending: Radiation Oncology | Admitting: Radiation Oncology

## 2020-11-29 DIAGNOSIS — C07 Malignant neoplasm of parotid gland: Secondary | ICD-10-CM | POA: Diagnosis not present

## 2020-11-29 DIAGNOSIS — Z510 Encounter for antineoplastic radiation therapy: Secondary | ICD-10-CM | POA: Diagnosis not present

## 2020-11-30 ENCOUNTER — Ambulatory Visit
Admission: RE | Admit: 2020-11-30 | Discharge: 2020-11-30 | Disposition: A | Discharge status: Home / Self Care | Payer: Medicare Other | Source: Ambulatory Visit | Attending: Radiation Oncology | Admitting: Radiation Oncology

## 2020-11-30 ENCOUNTER — Other Ambulatory Visit: Payer: Self-pay

## 2020-11-30 DIAGNOSIS — Z510 Encounter for antineoplastic radiation therapy: Secondary | ICD-10-CM | POA: Diagnosis not present

## 2020-11-30 DIAGNOSIS — C07 Malignant neoplasm of parotid gland: Secondary | ICD-10-CM | POA: Diagnosis not present

## 2020-12-01 ENCOUNTER — Ambulatory Visit
Admission: RE | Admit: 2020-12-01 | Discharge: 2020-12-01 | Disposition: A | Discharge status: Home / Self Care | Payer: Medicare Other | Source: Ambulatory Visit | Attending: Radiation Oncology | Admitting: Radiation Oncology

## 2020-12-01 DIAGNOSIS — Z510 Encounter for antineoplastic radiation therapy: Secondary | ICD-10-CM | POA: Diagnosis not present

## 2020-12-01 DIAGNOSIS — C07 Malignant neoplasm of parotid gland: Secondary | ICD-10-CM | POA: Diagnosis not present

## 2020-12-04 ENCOUNTER — Ambulatory Visit
Admission: RE | Admit: 2020-12-04 | Discharge: 2020-12-04 | Disposition: A | Discharge status: Home / Self Care | Payer: Medicare Other | Source: Ambulatory Visit | Attending: Radiation Oncology | Admitting: Radiation Oncology

## 2020-12-04 ENCOUNTER — Other Ambulatory Visit: Payer: Self-pay

## 2020-12-04 DIAGNOSIS — Z510 Encounter for antineoplastic radiation therapy: Secondary | ICD-10-CM | POA: Diagnosis not present

## 2020-12-04 DIAGNOSIS — C07 Malignant neoplasm of parotid gland: Secondary | ICD-10-CM | POA: Diagnosis not present

## 2020-12-05 ENCOUNTER — Ambulatory Visit
Admission: RE | Admit: 2020-12-05 | Discharge: 2020-12-05 | Disposition: A | Discharge status: Home / Self Care | Payer: Medicare Other | Source: Ambulatory Visit | Attending: Radiation Oncology | Admitting: Radiation Oncology

## 2020-12-05 DIAGNOSIS — Z510 Encounter for antineoplastic radiation therapy: Secondary | ICD-10-CM | POA: Diagnosis not present

## 2020-12-05 DIAGNOSIS — C07 Malignant neoplasm of parotid gland: Secondary | ICD-10-CM | POA: Diagnosis not present

## 2020-12-06 ENCOUNTER — Ambulatory Visit
Admission: RE | Admit: 2020-12-06 | Discharge: 2020-12-06 | Disposition: A | Discharge status: Home / Self Care | Payer: Medicare Other | Source: Ambulatory Visit | Attending: Radiation Oncology | Admitting: Radiation Oncology

## 2020-12-06 ENCOUNTER — Other Ambulatory Visit: Payer: Self-pay

## 2020-12-06 DIAGNOSIS — Z510 Encounter for antineoplastic radiation therapy: Secondary | ICD-10-CM | POA: Diagnosis not present

## 2020-12-06 DIAGNOSIS — C07 Malignant neoplasm of parotid gland: Secondary | ICD-10-CM | POA: Diagnosis not present

## 2020-12-07 ENCOUNTER — Ambulatory Visit
Admission: RE | Admit: 2020-12-07 | Discharge: 2020-12-07 | Disposition: A | Discharge status: Home / Self Care | Payer: Medicare Other | Source: Ambulatory Visit | Attending: Radiation Oncology | Admitting: Radiation Oncology

## 2020-12-07 DIAGNOSIS — C07 Malignant neoplasm of parotid gland: Secondary | ICD-10-CM | POA: Diagnosis not present

## 2020-12-07 DIAGNOSIS — Z510 Encounter for antineoplastic radiation therapy: Secondary | ICD-10-CM | POA: Diagnosis not present

## 2020-12-08 ENCOUNTER — Ambulatory Visit
Admission: RE | Admit: 2020-12-08 | Discharge: 2020-12-08 | Disposition: A | Discharge status: Home / Self Care | Payer: Medicare Other | Source: Ambulatory Visit | Attending: Radiation Oncology | Admitting: Radiation Oncology

## 2020-12-08 ENCOUNTER — Other Ambulatory Visit: Payer: Self-pay

## 2020-12-08 DIAGNOSIS — C07 Malignant neoplasm of parotid gland: Secondary | ICD-10-CM | POA: Diagnosis not present

## 2020-12-08 DIAGNOSIS — Z510 Encounter for antineoplastic radiation therapy: Secondary | ICD-10-CM | POA: Diagnosis not present

## 2020-12-11 ENCOUNTER — Telehealth: Payer: Medicare Other

## 2020-12-11 ENCOUNTER — Other Ambulatory Visit: Payer: Self-pay

## 2020-12-11 ENCOUNTER — Ambulatory Visit
Admission: RE | Admit: 2020-12-11 | Discharge: 2020-12-11 | Disposition: A | Discharge status: Home / Self Care | Payer: Medicare Other | Source: Ambulatory Visit | Attending: Radiation Oncology | Admitting: Radiation Oncology

## 2020-12-11 DIAGNOSIS — C07 Malignant neoplasm of parotid gland: Secondary | ICD-10-CM | POA: Diagnosis not present

## 2020-12-11 DIAGNOSIS — Z510 Encounter for antineoplastic radiation therapy: Secondary | ICD-10-CM | POA: Diagnosis not present

## 2020-12-12 ENCOUNTER — Ambulatory Visit
Admission: RE | Admit: 2020-12-12 | Discharge: 2020-12-12 | Disposition: A | Discharge status: Home / Self Care | Payer: Medicare Other | Source: Ambulatory Visit | Attending: Radiation Oncology | Admitting: Radiation Oncology

## 2020-12-12 DIAGNOSIS — Z510 Encounter for antineoplastic radiation therapy: Secondary | ICD-10-CM | POA: Diagnosis not present

## 2020-12-12 DIAGNOSIS — C07 Malignant neoplasm of parotid gland: Secondary | ICD-10-CM | POA: Diagnosis not present

## 2020-12-13 ENCOUNTER — Other Ambulatory Visit: Payer: Self-pay

## 2020-12-13 ENCOUNTER — Ambulatory Visit
Admission: RE | Admit: 2020-12-13 | Discharge: 2020-12-13 | Disposition: A | Discharge status: Home / Self Care | Payer: Medicare Other | Source: Ambulatory Visit | Attending: Radiation Oncology | Admitting: Radiation Oncology

## 2020-12-13 DIAGNOSIS — C07 Malignant neoplasm of parotid gland: Secondary | ICD-10-CM | POA: Diagnosis not present

## 2020-12-13 DIAGNOSIS — Z510 Encounter for antineoplastic radiation therapy: Secondary | ICD-10-CM | POA: Diagnosis not present

## 2020-12-14 ENCOUNTER — Ambulatory Visit
Admission: RE | Admit: 2020-12-14 | Discharge: 2020-12-14 | Disposition: A | Discharge status: Home / Self Care | Payer: Medicare Other | Source: Ambulatory Visit | Attending: Radiation Oncology | Admitting: Radiation Oncology

## 2020-12-14 ENCOUNTER — Inpatient Hospital Stay: Payer: Medicare Other | Admitting: Nutrition

## 2020-12-14 DIAGNOSIS — C07 Malignant neoplasm of parotid gland: Secondary | ICD-10-CM | POA: Diagnosis not present

## 2020-12-14 DIAGNOSIS — Z510 Encounter for antineoplastic radiation therapy: Secondary | ICD-10-CM | POA: Diagnosis not present

## 2020-12-14 NOTE — Progress Notes (Signed)
Nutrition follow-up completed with patient and spouse.  Patient receives radiation therapy for parotid cancer with last treatment scheduled for Tuesday, July 5. Patient continues to report increased fatigue.  He tries to stay active but finds he is prone to napping.  He has been drinking 2 boost plus daily but sometimes he increases it to 3.  Weight decreased and documented as 146.4 pounds.  He has dry mouth with thickened saliva.  Nutrition diagnosis: Food and nutrition related knowledge deficit improved.  Intervention: Continue high-calorie, high-protein foods with snacks between meals.  Increase boost plus or equivalent to 3-4 times daily.  Provided samples +1 complementary case of Ensure Plus.  Also gave patient coupons.  Monitoring, evaluation, goals: Patient will work to increase calories and protein to minimize further weight loss.  Next visit: Tuesday, July 5 after final radiation therapy.  **Disclaimer: This note was dictated with voice recognition software. Similar sounding words can inadvertently be transcribed and this note may contain transcription errors which may not have been corrected upon publication of note.**

## 2020-12-15 ENCOUNTER — Ambulatory Visit
Admission: RE | Admit: 2020-12-15 | Discharge: 2020-12-15 | Disposition: A | Discharge status: Home / Self Care | Payer: Medicare Other | Source: Ambulatory Visit | Attending: Radiation Oncology | Admitting: Radiation Oncology

## 2020-12-15 DIAGNOSIS — Z510 Encounter for antineoplastic radiation therapy: Secondary | ICD-10-CM | POA: Diagnosis not present

## 2020-12-15 DIAGNOSIS — C07 Malignant neoplasm of parotid gland: Secondary | ICD-10-CM | POA: Insufficient documentation

## 2020-12-19 ENCOUNTER — Ambulatory Visit
Admission: RE | Admit: 2020-12-19 | Discharge: 2020-12-19 | Disposition: A | Discharge status: Home / Self Care | Payer: Medicare Other | Source: Ambulatory Visit | Attending: Radiation Oncology | Admitting: Radiation Oncology

## 2020-12-19 ENCOUNTER — Inpatient Hospital Stay: Payer: Medicare Other | Attending: Nutrition | Admitting: Nutrition

## 2020-12-19 ENCOUNTER — Encounter: Payer: Self-pay | Admitting: Radiation Oncology

## 2020-12-19 ENCOUNTER — Other Ambulatory Visit: Payer: Self-pay | Admitting: Family Medicine

## 2020-12-19 ENCOUNTER — Other Ambulatory Visit: Payer: Self-pay

## 2020-12-19 DIAGNOSIS — C07 Malignant neoplasm of parotid gland: Secondary | ICD-10-CM | POA: Diagnosis not present

## 2020-12-19 DIAGNOSIS — Z510 Encounter for antineoplastic radiation therapy: Secondary | ICD-10-CM | POA: Diagnosis not present

## 2020-12-19 NOTE — Progress Notes (Signed)
Nutrition follow-up completed with patient and wife.  Today is patient's last radiation therapy for parotid cancer. Patient is pleased that he is finished.  Reports he really likes the Ensure Plus I gave him last week and he is now drinking 4 cartons a day.  He reports weight today was documented as 144.8 pounds.  He denies any new nutrition impact symptoms.  Nutrition diagnosis: Food and nutrition related knowledge deficit improved.  Intervention: Educated patient to continue strategies for adequate calories and protein to minimize weight loss and promote healing.  Continue Ensure Plus 4 times daily.  Provided 1 additional case of complementary Ensure Plus.  Monitoring, evaluation, goals: Will monitor intake and weights.  Next visit: Telephone follow-up on Tuesday, August 9.  Patient is aware.  **Disclaimer: This note was dictated with voice recognition software. Similar sounding words can inadvertently be transcribed and this note may contain transcription errors which may not have been corrected upon publication of note.**

## 2020-12-19 NOTE — Progress Notes (Signed)
Oncology Nurse Navigator Documentation   Met with Shaun Mcmillan after final RT to offer support and to celebrate end of radiation treatment.   Provided verbal/written post-RT guidance: Importance of keeping all follow-up appts, especially those with Nutrition. Importance of protecting treatment area from sun. Continuation of Sonafine application 2-3 times daily, application of antibiotic ointment to areas of raw skin; when supply of Sonafine exhausted transition to OTC lotion with vitamin E. Provided/reviewed Epic calendar of upcoming appts. Explained my role as navigator will continue for several more months, encouraged him to call me with needs/concerns.    Harlow Asa RN, BSN, OCN Head & Neck Oncology Nurse [facility] Phone # [phone]  Fax # [phone]

## 2020-12-28 DIAGNOSIS — H90A32 Mixed conductive and sensorineural hearing loss, unilateral, left ear with restricted hearing on the contralateral side: Secondary | ICD-10-CM | POA: Diagnosis not present

## 2020-12-28 DIAGNOSIS — Z8669 Personal history of other diseases of the nervous system and sense organs: Secondary | ICD-10-CM | POA: Diagnosis not present

## 2020-12-28 DIAGNOSIS — Z8509 Personal history of malignant neoplasm of other digestive organs: Secondary | ICD-10-CM | POA: Diagnosis not present

## 2020-12-28 DIAGNOSIS — Z923 Personal history of irradiation: Secondary | ICD-10-CM | POA: Diagnosis not present

## 2020-12-28 DIAGNOSIS — H90A21 Sensorineural hearing loss, unilateral, right ear, with restricted hearing on the contralateral side: Secondary | ICD-10-CM | POA: Diagnosis not present

## 2021-01-03 ENCOUNTER — Ambulatory Visit
Admission: RE | Admit: 2021-01-03 | Discharge: 2021-01-03 | Disposition: A | Discharge status: Home / Self Care | Payer: Medicare Other | Source: Ambulatory Visit | Attending: Radiation Oncology | Admitting: Radiation Oncology

## 2021-01-03 ENCOUNTER — Other Ambulatory Visit: Payer: Self-pay

## 2021-01-03 VITALS — BP 137/95 | HR 79 | Temp 97.2°F | Resp 18 | Wt 142.6 lb

## 2021-01-03 DIAGNOSIS — Z79899 Other long term (current) drug therapy: Secondary | ICD-10-CM | POA: Diagnosis not present

## 2021-01-03 DIAGNOSIS — C07 Malignant neoplasm of parotid gland: Secondary | ICD-10-CM | POA: Insufficient documentation

## 2021-01-03 DIAGNOSIS — Z7982 Long term (current) use of aspirin: Secondary | ICD-10-CM | POA: Diagnosis not present

## 2021-01-03 DIAGNOSIS — C7989 Secondary malignant neoplasm of other specified sites: Secondary | ICD-10-CM

## 2021-01-03 DIAGNOSIS — Z923 Personal history of irradiation: Secondary | ICD-10-CM | POA: Insufficient documentation

## 2021-01-03 DIAGNOSIS — R682 Dry mouth, unspecified: Secondary | ICD-10-CM | POA: Diagnosis not present

## 2021-01-03 NOTE — Progress Notes (Signed)
Mr. Trudo presents today for follow-up after completing radiation to his left parotid on 12/19/2020  Pain issues, if any: Patient denies Using a feeding tube?: N/A Weight changes, if any:  Wt Readings from Last 3 Encounters:  01/03/21 142 lb 9.6 oz (64.7 kg)  10/02/20 156 lb 6 oz (70.9 kg)  09/20/20 160 lb (72.6 kg)   Swallowing issues, if any: Patient denies.  Smoking or chewing tobacco? None Using fluoride trays daily? N/A--denies any oral sores or dental concerns Last ENT visit was on: 12/28/2020 Saw Dr. Izora Gala for lingering ear concerns.  "--Return visit. He completed radiation last week. He is doing well. He still having difficulty hearing on the left. On exam the ear canal is mostly clear, small amount of dried skin removed but the drum looks healthy. No signs of infection. Surgical site is well-healed. Minimal facial movement still. Recommend audiometric evaluation --Tympanogram is flat on the left, normal on the right. 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 --Recommend auto inflation exercises multiple times daily. If that fails to clear, after couple weeks, ventilation tube insertion would be helpful. Also recommend hearing aids for the age-related hearing loss. If his ear is doing much better follow-up in 3 months. "  Other notable issues, if any: Continues to deal with dry mouth and altered sense of taste. Left eye continues to water occasionally, but no longer appears red. Skin looks well healed/intact. Denies any jaw pain or sifficulty opening his mouth  Vitals:   01/03/21 1549  BP: (!) 137/95  Pulse: 79  Resp: 18  Temp: (!) 97.2 F (36.2 C)  SpO2: 99%

## 2021-01-04 ENCOUNTER — Encounter: Payer: Self-pay | Admitting: Radiation Oncology

## 2021-01-04 NOTE — Progress Notes (Signed)
Radiation Oncology         [phone] ________________________________  Name: Shaun Mcmillan MRN: [id number]  Date: 01/03/2021  DOB: [birth year]  Follow-Up Visit Note  CC: [name], MD  Izora Gala, MD  Diagnosis and Prior Radiotherapy:       ICD-10-CM   1. 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  Mr. Plucinski presents today for follow-up after completing radiation to his left parotid on 12/19/2020  Pain issues, if any: Patient denies Using a feeding tube?: N/A Weight changes, if any:  Wt Readings from Last 3 Encounters:  01/03/21 142 lb 9.6 oz (64.7 kg)  10/02/20 156 lb 6 oz (70.9 kg)  09/20/20 160 lb (72.6 kg)   Swallowing issues, if any: Patient denies.  Smoking or chewing tobacco? None Using fluoride trays daily? N/A--denies any oral sores or dental concerns Last ENT visit was on: 12/28/2020 Saw Dr. Izora Gala for lingering ear concerns.  "--Return visit. He completed radiation last week. He is doing well. He still having difficulty hearing on the left. On exam the ear canal is mostly clear, small amount of dried skin removed but the drum looks healthy. No signs of infection. Surgical site is well-healed. Minimal facial movement still. Recommend audiometric evaluation --Tympanogram is flat on the left, normal on the right. 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 --Recommend auto inflation exercises multiple times daily. If that fails to clear, after couple weeks, ventilation tube insertion would be helpful. Also recommend hearing aids for the age-related hearing loss. If his ear is doing much better follow-up in 3 months. "  Other notable issues, if any: Continues to deal with dry mouth and altered sense of  taste. Left eye continues to water occasionally, but no longer appears red. Skin looks well healed/intact. Denies any jaw pain or difficulty opening his mouth  Vitals:   01/03/21 1549  BP: (!) 137/95  Pulse: 79  Resp: 18  Temp: (!) 97.2 F (36.2 C)  SpO2: 99%                       ALLERGIES:  has No Known Allergies.  Meds: Current Outpatient Medications  Medication Sig Dispense Refill   acetaminophen (TYLENOL) 500 MG tablet Take 1,000 mg by mouth every 8 (eight) hours as needed for moderate pain.     amLODipine (NORVASC) 10 MG tablet TAKE 1 TABLET BY MOUTH  DAILY 90 tablet 1   aspirin EC 81 MG tablet Take 1 tablet (81 mg total) by mouth daily. Swallow whole. 30 tablet 11   atorvastatin (LIPITOR) 40 MG tablet TAKE 1 TABLET BY MOUTH  DAILY 90 tablet 1   Cholecalciferol (VITAMIN D PO) Take 5,000 Units by mouth daily.     diclofenac (VOLTAREN) 75 MG EC tablet Take 1 tablet (75 mg total) by mouth 2 (two) times daily. (Patient not taking: Reported on 09/15/2020) 60 tablet 0   latanoprost (XALATAN) 0.005 % ophthalmic solution Place 1 drop into both eyes every morning.     LORazepam (ATIVAN) 1 MG tablet Take 1 tablet (1 mg total) by mouth 3 (three) times daily as needed for anxiety. 60 tablet 5   Melatonin 5 MG TABS Take 5 mg by mouth at bedtime.  metFORMIN (GLUCOPHAGE) 500 MG tablet TAKE 1 TABLET BY MOUTH  TWICE DAILY WITH A MEAL 180 tablet 1   Multiple Vitamin (MULTIVITAMIN) tablet Take 1 tablet by mouth daily.     ofloxacin (FLOXIN) 0.3 % OTIC solution Place 5 drops into the left ear in the morning and at bedtime.     ramipril (ALTACE) 10 MG capsule TAKE 1 CAPSULE BY MOUTH  TWICE DAILY 180 capsule 1   timolol (BETIMOL) 0.5 % ophthalmic solution Place 1 drop into both eyes daily.     traMADol (ULTRAM) 50 MG tablet Take 2 tablets (100 mg total) by mouth every 6 (six) hours as needed for moderate pain. (Patient not taking: Reported on 09/15/2020) 60 tablet 1   No current  facility-administered medications for this encounter.    Physical Findings: The patient is in no acute distress. Patient is alert and oriented. Wt Readings from Last 3 Encounters:  01/03/21 142 lb 9.6 oz (64.7 kg)  10/02/20 156 lb 6 oz (70.9 kg)  09/20/20 160 lb (72.6 kg)    weight is 142 lb 9.6 oz (64.7 kg). His temperature is 97.2 F (36.2 C) (abnormal). His blood pressure is 137/95 (abnormal) and his pulse is 79. His respiration is 18 and oxygen saturation is 99%. .  General: Alert and oriented, in no acute distress HEENT: Head is normocephalic. Mouth/Oropharynx is notable for no lesions. Left eyelid drooping and eye watering have improved. Left facial droop has improved; left tympanic membrane occluded by cerumen Neck: Neck is notable for no palpable masses Skin: Skin in treatment fields shows satisfactory healing  Psychiatric: Judgment and insight are intact. Affect is appropriate.   Lab Findings: Lab Results  Component Value Date   WBC 9.9 02/10/2020   HGB 14.3 09/20/2020   HCT 42.0 09/20/2020   MCV 87.3 02/10/2020   PLT 279 02/10/2020    Lab Results  Component Value Date   TSH 1.73 02/10/2020    Radiographic Findings: No results found.  Impression/Plan:    1) Head and Neck Cancer Status: healing well from RT  2) Nutritional Status:  Wt Readings from Last 3 Encounters:  01/03/21 142 lb 9.6 oz (64.7 kg)  10/02/20 156 lb 6 oz (70.9 kg)  09/20/20 160 lb (72.6 kg)   Patient is stabilizing weight PEG tube: none  3)  Swallowing: functional, no issues  5) Dental: Encouraged to continue regular followup with dentistry, and dental hygiene including fluoride rinses.   6) Thyroid function:  check annually Lab Results  Component Value Date   TSH 1.73 02/10/2020    7)  follow-up: CT of neck and chest with contrast in early October and follow-up with me soon thereafter.  The patient was encouraged to call with any issues or questions before then.  On date of  service, in total, I spent 20 minutes on this encounter. Patient was seen in person. _____________________________________   Eppie Gibson, MD

## 2021-01-05 ENCOUNTER — Other Ambulatory Visit: Payer: Self-pay

## 2021-01-05 DIAGNOSIS — C07 Malignant neoplasm of parotid gland: Secondary | ICD-10-CM

## 2021-01-08 ENCOUNTER — Encounter: Payer: Self-pay | Admitting: Family Medicine

## 2021-01-08 ENCOUNTER — Other Ambulatory Visit: Payer: Self-pay

## 2021-01-08 ENCOUNTER — Ambulatory Visit (INDEPENDENT_AMBULATORY_CARE_PROVIDER_SITE_OTHER): Payer: Medicare Other | Admitting: Family Medicine

## 2021-01-08 VITALS — BP 120/78 | HR 83 | Temp 97.7°F | Ht 69.0 in | Wt 144.0 lb

## 2021-01-08 DIAGNOSIS — G629 Polyneuropathy, unspecified: Secondary | ICD-10-CM | POA: Diagnosis not present

## 2021-01-08 NOTE — Progress Notes (Signed)
   Subjective:    Patient ID: Shaun Mcmillan, male    DOB: [birth year], 80 y.o.   MRN: [id number]  HPI Here for what his family thinks is neuropathy. For the past 2 weeks he has had mild numbness in the left hand and the right foot. No swelling. No weakness or pain. No neck or back pain. He has diabetes and his last A1c in March was 5.8. he has a hx of B12 deficiency, but this has not been checked for about 7 years. He takes a B complex OTC vitamin every day. He is not a vegetarian and he does not take a PPI.    Review of Systems  Constitutional: Negative.   Respiratory: Negative.    Cardiovascular: Negative.   Musculoskeletal:  Negative for back pain and neck pain.  Neurological:  Positive for numbness. Negative for weakness.      Objective:   Physical Exam Constitutional:      Appearance: Normal appearance. He is not ill-appearing.  Cardiovascular:     Rate and Rhythm: Normal rate and regular rhythm.     Pulses: Normal pulses.     Heart sounds: Normal heart sounds.  Pulmonary:     Effort: Pulmonary effort is normal.     Breath sounds: Normal breath sounds.  Musculoskeletal:        General: No swelling.  Neurological:     Mental Status: He is alert and oriented to person, place, and time.     Comments: There is slightly deceased sensation to light touch in the left hand and in the right great toe. Motor is 5/5 in both hands and in both feet. Pulses are full. Skin is warm and pink.           Assessment & Plan:  Mild neuropathy. We will get labs today to check for an etiology, including a B12 level.  Alysia Penna, MD

## 2021-01-09 ENCOUNTER — Telehealth: Payer: Self-pay | Admitting: *Deleted

## 2021-01-09 LAB — CBC WITH DIFFERENTIAL/PLATELET
Absolute Monocytes: 496 cells/uL (ref 200–950)
Basophils Absolute: 18 cells/uL (ref 0–200)
Basophils Relative: 0.3 %
Eosinophils Absolute: 100 cells/uL (ref 15–500)
Eosinophils Relative: 1.7 %
HCT: 41.3 % (ref 38.5–50.0)
Hemoglobin: 14 g/dL (ref 13.2–17.1)
Lymphs Abs: 785 cells/uL — ABNORMAL LOW (ref 850–3900)
MCH: 30.3 pg (ref 27.0–33.0)
MCHC: 33.9 g/dL (ref 32.0–36.0)
MCV: 89.4 fL (ref 80.0–100.0)
MPV: 10.5 fL (ref 7.5–12.5)
Monocytes Relative: 8.4 %
Neutro Abs: 4502 cells/uL (ref 1500–7800)
Neutrophils Relative %: 76.3 %
Platelets: 248 10*3/uL (ref 140–400)
RBC: 4.62 10*6/uL (ref 4.20–5.80)
RDW: 13.1 % (ref 11.0–15.0)
Total Lymphocyte: 13.3 %
WBC: 5.9 10*3/uL (ref 3.8–10.8)

## 2021-01-09 LAB — BASIC METABOLIC PANEL
BUN/Creatinine Ratio: 22 (calc) (ref 6–22)
BUN: 15 mg/dL (ref 7–25)
CO2: 29 mmol/L (ref 20–32)
Calcium: 10.1 mg/dL (ref 8.6–10.3)
Chloride: 102 mmol/L (ref 98–110)
Creat: 0.69 mg/dL — ABNORMAL LOW (ref 0.70–1.22)
Glucose, Bld: 143 mg/dL — ABNORMAL HIGH (ref 65–99)
Potassium: 4 mmol/L (ref 3.5–5.3)
Sodium: 141 mmol/L (ref 135–146)

## 2021-01-09 LAB — HEMOGLOBIN A1C
Hgb A1c MFr Bld: 6 % of total Hgb — ABNORMAL HIGH (ref <5.7)
Mean Plasma Glucose: 126 mg/dL
eAG (mmol/L): 7 mmol/L

## 2021-01-09 LAB — TSH: TSH: 1.06 mIU/L (ref 0.40–4.50)

## 2021-01-09 LAB — T3, FREE: T3, Free: 3.5 pg/mL (ref 2.3–4.2)

## 2021-01-09 LAB — VITAMIN B12: Vitamin B-12: 761 pg/mL (ref 200–1100)

## 2021-01-09 LAB — T4, FREE: Free T4: 1.2 ng/dL (ref 0.8–1.8)

## 2021-01-09 NOTE — Telephone Encounter (Signed)
[name] PHONE CALL, SPOKE WITH PATIENT. ?

## 2021-01-11 ENCOUNTER — Encounter: Payer: Self-pay | Admitting: Family Medicine

## 2021-01-12 MED ORDER — GABAPENTIN 100 MG PO CAPS: 100.0000 mg | ORAL_CAPSULE | Freq: Three times a day (TID) | ORAL | 3 refills | Status: DC

## 2021-01-12 NOTE — Telephone Encounter (Signed)
We an treat this with Gabapentin. Try it for one month and then report back to [place]

## 2021-01-16 ENCOUNTER — Telehealth: Payer: Self-pay | Admitting: *Deleted

## 2021-01-16 NOTE — Telephone Encounter (Signed)
[name] PHONE CALL, SPOKE WITH PATIENT. ?

## 2021-01-23 ENCOUNTER — Other Ambulatory Visit: Payer: Self-pay

## 2021-01-23 ENCOUNTER — Telehealth: Payer: Self-pay | Admitting: Nutrition

## 2021-01-23 ENCOUNTER — Inpatient Hospital Stay: Payer: Medicare Other | Attending: Nutrition | Admitting: Nutrition

## 2021-01-23 NOTE — Telephone Encounter (Signed)
Telephone follow-up completed with patient who has completed radiation therapy for parotid cancer on July 5. Patient is in good spirits and is starting to feel well. He is very happy that his sense of taste has improved.  He reports eating sweet potatoes with butter, corn, ice cream, cheese toast with eggs, and meat salads.  He is drinking approximately 3 cartons of Ensure Plus per day.  He is requesting additional samples. Reports weight on home scale today was 140 pounds.  Last weight recorded in clinic was 142 pounds July 20.   Patient does not have a feeding tube. Patient denies other nutrition impact symptoms.  Nutrition diagnosis: Food and nutrition related knowledge deficit resolved.  Will provide 1 complementary case of Ensure Plus. Encourage patient to continue strategies for increasing calories and protein in small frequent meals and snacks.  Patient to contact RD for questions or concerns.  Please refer back to RD if nutrition impact symptoms are identified or weight loss continues.  **Disclaimer: This note was dictated with voice recognition software. Similar sounding words can inadvertently be transcribed and this note may contain transcription errors which may not have been corrected upon publication of note.**

## 2021-01-25 DIAGNOSIS — C07 Malignant neoplasm of parotid gland: Secondary | ICD-10-CM | POA: Diagnosis not present

## 2021-01-25 DIAGNOSIS — H90A32 Mixed conductive and sensorineural hearing loss, unilateral, left ear with restricted hearing on the contralateral side: Secondary | ICD-10-CM | POA: Diagnosis not present

## 2021-01-25 DIAGNOSIS — H6522 Chronic serous otitis media, left ear: Secondary | ICD-10-CM | POA: Diagnosis not present

## 2021-02-12 NOTE — Progress Notes (Signed)
                                                                                                                                                             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   Plan: The patient will follow-up with radiation oncology in 2-3 wks.  -----------------------------------  Eppie Gibson, MD

## 2021-02-14 ENCOUNTER — Other Ambulatory Visit: Payer: Self-pay

## 2021-02-14 ENCOUNTER — Ambulatory Visit (INDEPENDENT_AMBULATORY_CARE_PROVIDER_SITE_OTHER): Payer: Medicare Other | Admitting: Family Medicine

## 2021-02-14 ENCOUNTER — Encounter: Payer: Self-pay | Admitting: Family Medicine

## 2021-02-14 VITALS — BP 120/70 | HR 71 | Temp 97.8°F | Wt 145.1 lb

## 2021-02-14 DIAGNOSIS — G629 Polyneuropathy, unspecified: Secondary | ICD-10-CM

## 2021-02-14 MED ORDER — GABAPENTIN 300 MG PO CAPS: 300.0000 mg | ORAL_CAPSULE | Freq: Three times a day (TID) | ORAL | 3 refills | Status: DC

## 2021-02-14 NOTE — Progress Notes (Signed)
   Subjective:    Patient ID: Neko Komisar, male    DOB: 18-Sep-1940, 80 y.o.   MRN: [id number]  HPI Here to follow up on neuropathy. We had seen him on 01-08-21 for what we understood was numbness in the left hand and the right foot. Now he says the right foot actually had pain and not numbness. 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 They recommended he try Voltaren gel. Meanwhile the left hand numbness had involved the entire hand and all 5 fingers. When we saw him we started him on Gabapentin 100 mg TID. Since then the numbness has improved in the left 3rd and 4th fingers, but the other 3 are still just as numb. There is no pain.    Review of Systems  Constitutional: Negative.   Respiratory: Negative.    Cardiovascular: Negative.   Neurological:  Positive for numbness.      Objective:   Physical Exam Constitutional:      Appearance: Normal appearance.  Cardiovascular:     Rate and Rhythm: Normal rate and regular rhythm.     Pulses: Normal pulses.     Heart sounds: Normal heart sounds.  Pulmonary:     Effort: Pulmonary effort is normal.     Breath sounds: Normal breath sounds.  Musculoskeletal:     Comments: Left hand appears normal   Neurological:     Mental Status: He is alert.          Assessment & Plan:  Neuropathy, we agreed to increase the Gabapentin to 300 mg TID. He will report back in 2-3 weeks. Alysia Penna, MD

## 2021-02-20 ENCOUNTER — Telehealth: Payer: Self-pay | Admitting: *Deleted

## 2021-02-20 NOTE — Telephone Encounter (Signed)
[name] PHONE CALL, SPOKE WITH PATIENT, I EXPLAINED ONCE THE INSURANCE COMPANY PRE-AUTH THE SCAN, I WILL SCHEDULED THIS AND GIVE HIM A CALL WITH THIS INFO, PATIENT VERIFIED UNDERSTANDING THIS

## 2021-02-21 ENCOUNTER — Telehealth: Payer: Self-pay | Admitting: Family Medicine

## 2021-02-21 NOTE — Telephone Encounter (Signed)
Spoke to spouse to schedule Medicare Annual Wellness Visit (AWV) either virtually or in office. Left  my Shaun Mcmillan number [phone]  She wanted call back late oct first nov  a lot going on now    Last AWV 04/13/20  please schedule at anytime with [facility] Nurse Health Advisor 1 or 2   This should be a 45 minute visit.

## 2021-03-02 ENCOUNTER — Ambulatory Visit (INDEPENDENT_AMBULATORY_CARE_PROVIDER_SITE_OTHER): Payer: Medicare Other

## 2021-03-02 ENCOUNTER — Other Ambulatory Visit: Payer: Self-pay

## 2021-03-02 DIAGNOSIS — Z23 Encounter for immunization: Secondary | ICD-10-CM

## 2021-03-02 NOTE — Progress Notes (Signed)
[id number]

## 2021-03-02 NOTE — Addendum Note (Signed)
Addended by: Nilda Riggs on: 03/02/2021 02:53 PM   Modules accepted: Orders

## 2021-03-13 ENCOUNTER — Telehealth: Payer: Self-pay | Admitting: *Deleted

## 2021-03-13 NOTE — Telephone Encounter (Signed)
CALLED PATIENT TO INFORM OF STAT LABS ON 03-19-21 @ 1:30 PM @ [place] AND HIS CT TO FOLLOW ON 03-19-21 - ARRIVAL TIME- 2:15 PM @ [facility], PATIENT TO HAVE WATER ONLY 4 HRS. PRIOR TO TEST, PATIENT TO RECEIVE CT RESULTS FROM DR. SQUIRE ON 03-20-21 @ 2 PM, SPOKE WITH PATIENT AND HE IS AWARE OF THESE APPTS.

## 2021-03-19 ENCOUNTER — Other Ambulatory Visit: Payer: Self-pay

## 2021-03-19 ENCOUNTER — Ambulatory Visit (HOSPITAL_COMMUNITY)
Admission: RE | Admit: 2021-03-19 | Discharge: 2021-03-19 | Disposition: A | Discharge status: Home / Self Care | Payer: Medicare Other | Source: Ambulatory Visit | Attending: Radiation Oncology | Admitting: Radiation Oncology

## 2021-03-19 ENCOUNTER — Encounter (HOSPITAL_COMMUNITY): Payer: Self-pay

## 2021-03-19 ENCOUNTER — Ambulatory Visit
Admission: RE | Admit: 2021-03-19 | Discharge: 2021-03-19 | Disposition: A | Discharge status: Home / Self Care | Payer: Medicare Other | Source: Ambulatory Visit | Attending: Radiation Oncology | Admitting: Radiation Oncology

## 2021-03-19 DIAGNOSIS — I700 Atherosclerosis of aorta: Secondary | ICD-10-CM | POA: Insufficient documentation

## 2021-03-19 DIAGNOSIS — C07 Malignant neoplasm of parotid gland: Secondary | ICD-10-CM

## 2021-03-19 DIAGNOSIS — Z7982 Long term (current) use of aspirin: Secondary | ICD-10-CM | POA: Insufficient documentation

## 2021-03-19 DIAGNOSIS — N200 Calculus of kidney: Secondary | ICD-10-CM | POA: Insufficient documentation

## 2021-03-19 DIAGNOSIS — Z7984 Long term (current) use of oral hypoglycemic drugs: Secondary | ICD-10-CM | POA: Insufficient documentation

## 2021-03-19 DIAGNOSIS — I2510 Atherosclerotic heart disease of native coronary artery without angina pectoris: Secondary | ICD-10-CM | POA: Insufficient documentation

## 2021-03-19 DIAGNOSIS — R918 Other nonspecific abnormal finding of lung field: Secondary | ICD-10-CM | POA: Insufficient documentation

## 2021-03-19 DIAGNOSIS — E041 Nontoxic single thyroid nodule: Secondary | ICD-10-CM | POA: Insufficient documentation

## 2021-03-19 DIAGNOSIS — I6523 Occlusion and stenosis of bilateral carotid arteries: Secondary | ICD-10-CM | POA: Diagnosis not present

## 2021-03-19 DIAGNOSIS — R911 Solitary pulmonary nodule: Secondary | ICD-10-CM | POA: Diagnosis not present

## 2021-03-19 DIAGNOSIS — N281 Cyst of kidney, acquired: Secondary | ICD-10-CM | POA: Insufficient documentation

## 2021-03-19 DIAGNOSIS — Z923 Personal history of irradiation: Secondary | ICD-10-CM | POA: Insufficient documentation

## 2021-03-19 DIAGNOSIS — Z79899 Other long term (current) drug therapy: Secondary | ICD-10-CM | POA: Insufficient documentation

## 2021-03-19 DIAGNOSIS — M47812 Spondylosis without myelopathy or radiculopathy, cervical region: Secondary | ICD-10-CM | POA: Diagnosis not present

## 2021-03-19 DIAGNOSIS — Z85819 Personal history of malignant neoplasm of unspecified site of lip, oral cavity, and pharynx: Secondary | ICD-10-CM | POA: Insufficient documentation

## 2021-03-19 DIAGNOSIS — J984 Other disorders of lung: Secondary | ICD-10-CM | POA: Insufficient documentation

## 2021-03-19 LAB — BUN & CREATININE (CHCC)
BUN: 16 mg/dL (ref 8–23)
Creatinine: 0.81 mg/dL (ref 0.61–1.24)
GFR, Estimated: >60 mL/min (ref >60)

## 2021-03-19 MED ORDER — IOHEXOL 350 MG/ML SOLN
75.0000 mL | Freq: Once | INTRAVENOUS | Status: AC | PRN
  Administered 2021-03-19: 75 mL via INTRAVENOUS

## 2021-03-19 NOTE — Progress Notes (Signed)
Nutrition  Complimentary case of ensure enlive given to patient.   Rayni Nemitz B. Zenia Resides, [place], [place] Registered Dietitian [phone] (mobile)

## 2021-03-20 ENCOUNTER — Ambulatory Visit
Admission: RE | Admit: 2021-03-20 | Discharge: 2021-03-20 | Disposition: A | Discharge status: Home / Self Care | Payer: Medicare Other | Source: Ambulatory Visit | Attending: Radiation Oncology | Admitting: Radiation Oncology

## 2021-03-20 VITALS — BP 142/68 | HR 78 | Temp 97.5°F | Resp 18 | Ht 69.0 in | Wt 142.4 lb

## 2021-03-20 DIAGNOSIS — Z7982 Long term (current) use of aspirin: Secondary | ICD-10-CM | POA: Diagnosis not present

## 2021-03-20 DIAGNOSIS — C07 Malignant neoplasm of parotid gland: Secondary | ICD-10-CM | POA: Diagnosis not present

## 2021-03-20 DIAGNOSIS — J984 Other disorders of lung: Secondary | ICD-10-CM | POA: Insufficient documentation

## 2021-03-20 DIAGNOSIS — Z923 Personal history of irradiation: Secondary | ICD-10-CM | POA: Insufficient documentation

## 2021-03-20 DIAGNOSIS — C7989 Secondary malignant neoplasm of other specified sites: Secondary | ICD-10-CM

## 2021-03-20 DIAGNOSIS — Z79899 Other long term (current) drug therapy: Secondary | ICD-10-CM | POA: Insufficient documentation

## 2021-03-20 DIAGNOSIS — I2510 Atherosclerotic heart disease of native coronary artery without angina pectoris: Secondary | ICD-10-CM | POA: Insufficient documentation

## 2021-03-20 DIAGNOSIS — E041 Nontoxic single thyroid nodule: Secondary | ICD-10-CM | POA: Insufficient documentation

## 2021-03-20 DIAGNOSIS — Z85819 Personal history of malignant neoplasm of unspecified site of lip, oral cavity, and pharynx: Secondary | ICD-10-CM | POA: Diagnosis not present

## 2021-03-20 DIAGNOSIS — Z7984 Long term (current) use of oral hypoglycemic drugs: Secondary | ICD-10-CM | POA: Diagnosis not present

## 2021-03-20 DIAGNOSIS — N281 Cyst of kidney, acquired: Secondary | ICD-10-CM | POA: Diagnosis not present

## 2021-03-20 DIAGNOSIS — Z08 Encounter for follow-up examination after completed treatment for malignant neoplasm: Secondary | ICD-10-CM | POA: Diagnosis not present

## 2021-03-20 DIAGNOSIS — R918 Other nonspecific abnormal finding of lung field: Secondary | ICD-10-CM | POA: Insufficient documentation

## 2021-03-20 DIAGNOSIS — I700 Atherosclerosis of aorta: Secondary | ICD-10-CM | POA: Insufficient documentation

## 2021-03-20 DIAGNOSIS — N200 Calculus of kidney: Secondary | ICD-10-CM | POA: Diagnosis not present

## 2021-03-20 MED ORDER — PENTOXIFYLLINE ER 400 MG PO TBCR: EXTENDED_RELEASE_TABLET | ORAL | 5 refills | Status: DC

## 2021-03-20 MED ORDER — VITAMIN E 180 MG (400 UNIT) PO CAPS: ORAL_CAPSULE | ORAL | 5 refills | Status: DC

## 2021-03-20 NOTE — Progress Notes (Signed)
Radiation Oncology         [phone] ________________________________  Name: Shaun Mcmillan MRN: [id number]  Date: 03/20/2021  DOB: Oct 07, 1940  Follow-Up Visit Note  CC: Shaun Morale, MD  Shaun Gala, MD  Diagnosis and Prior Radiotherapy:       ICD-10-CM   1. 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  Shaun Mcmillan presents today for follow-up after completing radiation to his left parotid on 12/19/2020  Doing well overall    Weight changes, if any:  Wt Readings from Last 3 Encounters:  03/20/21 142 lb 6.4 oz (64.6 kg)  02/14/21 145 lb 2 oz (65.8 kg)  01/08/21 144 lb (65.3 kg)    Left hand notable for numbness  of fingers (4th and 5th digits especially) noted for about 3 mo. Started gabapentin through PCP.  No weakness or numbness of left arm.  Wishes he could gain more weight.   ALLERGIES:  has No Known Allergies.  Meds: Current Outpatient Medications  Medication Sig Dispense Refill   pentoxifylline (TRENTAL) 400 MG CR tablet Take 400mg  daily x 1 week, then 400mg  BID; take with food 60 tablet 5   vitamin E 180 MG (400 UNITS) capsule Take 400 IU daily x 1 week, then 400 IU BID 60 capsule 5   acetaminophen (TYLENOL) 500 MG tablet Take 1,000 mg by mouth every 8 (eight) hours as needed for moderate pain.     amLODipine (NORVASC) 10 MG tablet TAKE 1 TABLET BY MOUTH  DAILY 90 tablet 1   aspirin EC 81 MG tablet Take 1 tablet (81 mg total) by mouth daily. Swallow whole. 30 tablet 11   atorvastatin (LIPITOR) 40 MG tablet TAKE 1 TABLET BY MOUTH  DAILY 90 tablet 1   Cholecalciferol (VITAMIN D PO) Take 5,000 Units by mouth daily.     gabapentin (NEURONTIN) 300 MG capsule Take 1 capsule (300 mg total) by mouth 3 (three) times  daily. 90 capsule 3   latanoprost (XALATAN) 0.005 % ophthalmic solution Place 1 drop into both eyes every morning.     LORazepam (ATIVAN) 1 MG tablet Take 1 tablet (1 mg total) by mouth 3 (three) times daily as needed for anxiety. 60 tablet 5   Melatonin 5 MG TABS Take 5 mg by mouth at bedtime.     metFORMIN (GLUCOPHAGE) 500 MG tablet TAKE 1 TABLET BY MOUTH  TWICE DAILY WITH A MEAL 180 tablet 1   Multiple Vitamin (MULTIVITAMIN) tablet Take 1 tablet by mouth daily.     ramipril (ALTACE) 10 MG capsule TAKE 1 CAPSULE BY MOUTH  TWICE DAILY 180 capsule 1   timolol (BETIMOL) 0.5 % ophthalmic solution Place 1 drop into both eyes daily.     No current facility-administered medications for this encounter.    Physical Findings: The patient is in no acute distress. Patient is alert and oriented. Wt Readings from Last 3 Encounters:  03/20/21 142 lb 6.4 oz (64.6 kg)  02/14/21 145 lb 2 oz (65.8 kg)  01/08/21 144 lb (65.3 kg)    height is 5\' 9"  (1.753 m) and weight is 142 lb 6.4 oz (64.6 kg). His temporal temperature is 97.5 F (36.4 C) (abnormal). His blood pressure is 142/68 (abnormal) and his  pulse is 78. His respiration is 18 and oxygen saturation is 99%. .  General: Alert and oriented, in no acute distress HEENT: Head is normocephalic. Left facial droop improving. Neck: Neck is notable for no palpable masses Skin: Skin in treatment fields shows satisfactory healing  Psychiatric: Judgment and insight are intact. Affect is appropriate. Heart RRR Chest CTAB Neuro: Reduced sensation to left fingers, strength grossly intact, possibly subtlely reduced in fingers but not hand or arm  Lab Findings: Lab Results  Component Value Date   WBC 5.9 01/08/2021   HGB 14.0 01/08/2021   HCT 41.3 01/08/2021   MCV 89.4 01/08/2021   PLT 248 01/08/2021    Lab Results  Component Value Date   TSH 1.06 01/08/2021    Radiographic Findings: CT Soft Tissue Neck W Contrast  Result Date: 03/19/2021 CLINICAL  DATA:  History of left parotid mass with parotidectomy. EXAM: CT NECK WITH CONTRAST TECHNIQUE: Multidetector CT imaging of the neck was performed using the standard protocol following the bolus administration of intravenous contrast. CONTRAST:  73mL OMNIPAQUE IOHEXOL 350 MG/ML SOLN COMPARISON:  PET scan 10/06/2020.  CT 09/12/2020. FINDINGS: Pharynx and larynx: No mucosal or submucosal lesion. Salivary glands: Submandibular glands are normal. Right parotid gland is normal. There is been resection of at least the superficial lobe of the left parotid. I think there may be some remaining portion of the deep lobe. There are surgical clips in the region of the retromandibular structures. Thyroid: 9 mm nodule at the inferior aspect of the left lobe of the thyroid. Lymph nodes: No enlarged or low-density lymph nodes on either side of the neck. Vascular: Atherosclerotic calcification at both carotid bifurcations. Limited intracranial: Normal Visualized orbits: Not included. Mastoids and visualized paranasal sinuses: Clear sinuses. Small amount of fluid at the mastoid tip on the left. Skeleton: Negative except for ordinary cervical spondylosis. Upper chest: Mild scarring at the lung apices. No pulmonary nodule in the upper chest. Other: None IMPRESSION: Status post parotid resection on the left. Question if there is some residual deep lobe. No sign of a recurrent mass in the previous superficial lobe location. Consider PET scan to ensure that the residual tissue relates to deep lobe parotid tissue rather than recurrent tumor, which I doubt. No evidence of regional adenopathy. 9 mm nodule at the lower pole of the left lobe of the thyroid. No followup recommended (ref: J Am Coll Radiol. 2015 Feb;12(2): 143-50). Electronically Signed   By: Nelson Mcmillan M.D.   On: 03/19/2021 15:51   CT Chest W Contrast  Result Date: 03/19/2021 CLINICAL DATA:  Assess treatment response. History of head and neck cancer. EXAM: CT CHEST WITH  CONTRAST TECHNIQUE: Multidetector CT imaging of the chest was performed during intravenous contrast administration. CONTRAST:  21mL OMNIPAQUE IOHEXOL 350 MG/ML SOLN COMPARISON:  PET-CT from 10/06/2020 FINDINGS: Cardiovascular: Normal heart size. Aortic atherosclerosis and coronary artery calcifications. No pericardial effusion. Mediastinum/Nodes: No enlarged mediastinal, hilar, or axillary lymph nodes. Thyroid gland, trachea, and esophagus demonstrate no significant findings. Lungs/Pleura: Biapical pleuroparenchymal scarring. No pleural effusion, airspace consolidation or pneumothorax. Scarring with volume loss noted within both lower lobes, left greater than right. Similar to the previous exam. There is a tiny nodule within the right upper lobe measuring 2 mm, image 46/4. Not confidently identified on the previous exam. A second tiny nodule is noted in the right middle lobe measuring 3 mm, image 91/4. Also not confidently identified on the previous exam Upper Abdomen: No acute abnormality. Right kidney cyst measures  at least 5.3 cm, image 60/1. Musculoskeletal: No chest wall abnormality. No acute or significant osseous findings. IMPRESSION: 1. No acute cardiopulmonary abnormalities. 2. There are 2 tiny nodules within the right lung measuring up to 3 mm. Not confidently identified on the previous exam. These nodules have a nonspecific appearance. Today's findings may reflect differences in technique (prior PET-CT was limited by motion artifact and the slice thickness was 5 mm versus 2 mm on today's study.) Consider short-term interval surveillance with repeat CT of the chest in 3-6 months to assess for temporal change in the appearance of these nodules. 3. Aortic Atherosclerosis (ICD10-I70.0). Coronary artery calcifications. Electronically Signed   By: Kerby Moors M.D.   On: 03/19/2021 15:59    Impression/Plan:    1) Head and Neck Cancer Status:  in remission; will obtain CT neck/ chest in 56mo for  surveillance.  I believe lung nodules are benign and parotid tissue is residual from surgery, based on my discussion w Dr Constance Holster and radiology  2) Nutritional Status:  Wt Readings from Last 3 Encounters:  03/20/21 142 lb 6.4 oz (64.6 kg)  02/14/21 145 lb 2 oz (65.8 kg)  01/08/21 144 lb (65.3 kg)   Patient is stabilizing weight PEG tube: none  3)  Swallowing: functional, no issues  5) Dental: Encouraged to continue regular followup with dentistry, and dental hygiene including fluoride rinses.   6) Thyroid function:  check annually Lab Results  Component Value Date   TSH 1.06 01/08/2021    7) LEFT Finger numbness unlikely to be due to RT given timeline of symptoms and pattern of distribution of numbness.  May be related to DM.  Will add on trial of Vit E and pentoxyfylline in case there is neuropathy from surgery/RT in hopes this will improve symptoms.  8) Follow-up with me as above in 55mo.  The patient was encouraged to call with any issues or questions before then.  On date of service, in total, I spent 30 minutes on this encounter. Patient was seen in person. _____________________________________   Eppie Gibson, MD

## 2021-03-20 NOTE — Progress Notes (Signed)
Shaun Mcmillan presents today for follow-up after completing radiation to his left parotid on 12/19/2020 and to review CT scan results from 03/19/2021  Pain issues, if any: Patient denies Using a feeding tube?: N/A Weight changes, if any:  Wt Readings from Last 3 Encounters:  03/20/21 142 lb 6.4 oz (64.6 kg)  02/14/21 145 lb 2 oz (65.8 kg)  01/08/21 144 lb (65.3 kg)   Swallowing issues, if any: Patient denies. Reports he has a healthy appetite and is eating well throughout the day (supplementing with Boost in-between meals) Smoking or chewing tobacco? None Using fluoride trays daily? N/A--denies any oral sores or dental concerns Last ENT visit was on: 01/25/2021 Saw Shaun Mcmillan:  "Return visit. He feels that his hearing has improved a little bit. On exam I cleaned out some wax from the canal. The drum is intact. There is no obvious effusion today. There is reasonable movement with pneumatic exam. I would like to get tympanograms to see if there is still a need for ventilation tube insertion. Tympanogram is still flat on the left, normal on the right, proceed with ventilation tube insertion." --Reports he has F/U with Shaun Mcmillan again in a few weeks  Other notable issues, if any: Reports dry mouth and thick saliva have almost completely resolved (mainly experiences dry mouth at night when sleeping). Denies any ear or jaw pain or difficulty opening his mouth. Reports he's sleeping well (getting 7-8 hours a night). Noticed a numbness/decreased sensation to his left hand a few months ago. Mentioned to PCP who started him on gabapentin. Denies any changes or improvement but does report fine motor skills can be difficult with that hand. Overall though, reports he feels well and is in good spirits   Vitals:   03/20/21 1407  BP: (!) 142/68  Pulse: 78  Resp: 18  Temp: (!) 97.5 F (36.4 C)  SpO2: 99%

## 2021-03-20 NOTE — Progress Notes (Signed)
Oncology Nurse Navigator Documentation   I met with Mr. Abee and his wife during his follow up with Dr. Isidore Moos today to receive results of his recent scan. He is feeling well post radiation. He is aware that I will notify him tomorrow after ENT conference regarding possible future scans and when he will see Dr. Isidore Moos again. They know to call me if they have any questions or concerns.   Harlow Asa RN, BSN, OCN Head & Neck Oncology Nurse [place] at [facility] Phone # [phone]  Fax # [phone]

## 2021-03-21 ENCOUNTER — Other Ambulatory Visit: Payer: Self-pay

## 2021-03-21 ENCOUNTER — Encounter: Payer: Self-pay | Admitting: Radiation Oncology

## 2021-03-21 DIAGNOSIS — C07 Malignant neoplasm of parotid gland: Secondary | ICD-10-CM

## 2021-03-21 NOTE — Progress Notes (Signed)
Oncology Nurse Navigator Documentation   I called and spoke to Mr. Feigel wife today regarding the discussion at ENT conference this morning. It was recommended that he have a CT of his neck and chest in 4 months to follow up his recent scans. She is aware that he will see Dr. Isidore Moos after the scans to receive results. She voiced her appreciation for the phone call and knows to call me if she has any further questions.   Harlow Asa RN, BSN, OCN Head & Neck Oncology Nurse [place] at [facility] Phone # [phone]  Fax # [phone]

## 2021-03-23 ENCOUNTER — Telehealth: Payer: Self-pay

## 2021-03-23 NOTE — Telephone Encounter (Signed)
Patient called yesterday afternoon stating his insurance wouldn't cover the vitamin E prescription since it can be bought OTC. He bought a bottle of OTC vitamin E, but didn't realize it was a different strength than what Dr. Isidore Moos prescribed until he got home (OTC was for 1,000 IU). He wanted to know if it was appropriate for him to take the version he bought, or if he would need to return the bottle and get a dosage similar to what Dr. Isidore Moos wrote. Informed him I would check with Dr. Isidore Moos and call him back with an update. Patient verbalized understanding and appreciation.   Updated Dr. Isidore Moos, who advised that is was fine for patient to take the version he bought. He just wouldn't need to increase to BID after taking it for a week--he would stay at 1 capsule daily  [name] call this morning and relayed Dr. Pearlie Oyster recommendations. Patient verbalized understanding and agreement, and denied any other needs at this time. Patient knows to call me back directly should he have any other questions/concerns

## 2021-03-29 DIAGNOSIS — Z85828 Personal history of other malignant neoplasm of skin: Secondary | ICD-10-CM | POA: Diagnosis not present

## 2021-03-29 DIAGNOSIS — C44222 Squamous cell carcinoma of skin of right ear and external auricular canal: Secondary | ICD-10-CM | POA: Diagnosis not present

## 2021-03-29 DIAGNOSIS — L821 Other seborrheic keratosis: Secondary | ICD-10-CM | POA: Diagnosis not present

## 2021-04-10 DIAGNOSIS — H9113 Presbycusis, bilateral: Secondary | ICD-10-CM | POA: Diagnosis not present

## 2021-04-10 DIAGNOSIS — C07 Malignant neoplasm of parotid gland: Secondary | ICD-10-CM | POA: Diagnosis not present

## 2021-04-12 DIAGNOSIS — C44222 Squamous cell carcinoma of skin of right ear and external auricular canal: Secondary | ICD-10-CM | POA: Diagnosis not present

## 2021-04-24 DIAGNOSIS — H401122 Primary open-angle glaucoma, left eye, moderate stage: Secondary | ICD-10-CM | POA: Diagnosis not present

## 2021-04-24 DIAGNOSIS — H52203 Unspecified astigmatism, bilateral: Secondary | ICD-10-CM | POA: Diagnosis not present

## 2021-04-24 DIAGNOSIS — E119 Type 2 diabetes mellitus without complications: Secondary | ICD-10-CM | POA: Diagnosis not present

## 2021-04-24 DIAGNOSIS — H2513 Age-related nuclear cataract, bilateral: Secondary | ICD-10-CM | POA: Diagnosis not present

## 2021-04-25 ENCOUNTER — Other Ambulatory Visit: Payer: Self-pay

## 2021-04-25 ENCOUNTER — Encounter: Payer: Self-pay | Admitting: Family Medicine

## 2021-04-25 ENCOUNTER — Ambulatory Visit (INDEPENDENT_AMBULATORY_CARE_PROVIDER_SITE_OTHER): Payer: Medicare Other | Admitting: Family Medicine

## 2021-04-25 VITALS — BP 102/62 | HR 72 | Temp 97.5°F | Wt 143.0 lb

## 2021-04-25 DIAGNOSIS — L247 Irritant contact dermatitis due to plants, except food: Secondary | ICD-10-CM | POA: Diagnosis not present

## 2021-04-25 MED ORDER — TRIAMCINOLONE ACETONIDE 0.1 % EX CREA: 1.0000 "application " | TOPICAL_CREAM | Freq: Two times a day (BID) | CUTANEOUS | 0 refills | Status: DC

## 2021-04-25 MED ORDER — FEXOFENADINE HCL 60 MG PO TABS: 60.0000 mg | ORAL_TABLET | Freq: Every day | ORAL | 0 refills | Status: DC

## 2021-04-25 NOTE — Progress Notes (Signed)
Subjective:    Patient ID: Shaun Mcmillan, male    DOB: 28-Jun-1940, 80 y.o.   MRN: [id number]  Chief Complaint  Patient presents with   Rash    Rash around neck, wife noticed yesterday. Was more red and pronounced yesterday. Put some vaseline on it.    HPI Patient was seen today for acute concern.  Patient endorses rash on right neck.  Patient states his wife noticed it yesterday.  Patient endorses pruritus.  States a bump yesterday looked like a pimple but is now flat.  Denies changes in soaps, lotions, detergents, or foods.  No pets in the home.  Denies insect bites.  Trimmed bushes few days ago.  Wore long sleeves but endorses the bushes were rather long and fell all over him.  Patient was concerned about shingles.  Endorses increased stress over the last 1 to 2 years 2/2 diagnosis of squamous cell carcinoma to parotid gland.  Past Medical History:  Diagnosis Date   Arthritis    Diabetes mellitus type II    Glaucoma    sees Dr. 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    No Known Allergies  ROS General: Denies fever, chills, night sweats, changes in weight, changes in appetite HEENT: Denies headaches, ear pain, changes in vision, rhinorrhea, sore throat CV: Denies CP, palpitations, SOB, orthopnea Pulm: Denies SOB, cough, wheezing GI: Denies abdominal pain, nausea, vomiting, diarrhea, constipation GU: Denies dysuria, hematuria, frequency Msk: Denies muscle cramps, joint pains Neuro: Denies weakness, numbness, tingling Skin: Denies rashes, bruising  pruritic rash on right side of neck Psych: Denies depression, anxiety, hallucinations     Objective:    Blood pressure 102/62, pulse 72, temperature (!) 97.5 F (36.4 C), temperature source Oral, weight 143 lb (64.9 kg), SpO2 96 %.  Gen. Pleasant,  well-nourished, in no distress, normal affect   HEENT: [place]/AT, face symmetric, conjunctiva clear, no scleral icterus, PERRLA, EOMI, nares patent without drainage, Lungs: no accessory muscle use Cardiovascular: RRR Musculoskeletal: No deformities, no cyanosis or clubbing, normal tone Neuro:  A&Ox3, CN II-XII intact, normal gait Skin:  Warm, dry, intact.  Flat erythematous rash of right lateral neck crossing midline posteriorly and going into left occipital area of scalp.  No pustules or vesicles noted.  No flaking or cracked skin.  No other lesions elsewhere on body.  Well-healed surgical incision of right ear with several sutures in place.   Wt Readings from Last 3 Encounters:  03/20/21 142 lb 6.4 oz (64.6 kg)  02/14/21 145 lb 2 oz (65.8 kg)  01/08/21 144 lb (65.3 kg)    Lab Results  Component Value Date   WBC 5.9 01/08/2021   HGB 14.0 01/08/2021   HCT 41.3 01/08/2021   PLT 248 01/08/2021   GLUCOSE 143 (H) 01/08/2021   CHOL 136 02/10/2020   TRIG 84 02/10/2020   HDL 59 02/10/2020   LDLCALC 61 02/10/2020   ALT 18 02/10/2020   AST 18 02/10/2020   NA 141 01/08/2021   K 4.0 01/08/2021   CL 102 01/08/2021   CREATININE 0.81 03/19/2021   BUN 16 03/19/2021   CO2 29 01/08/2021   TSH 1.06 01/08/2021   PSA 1.5 02/10/2020   INR 1.1 11/13/2008   HGBA1C  6.0 (H) 01/08/2021   MICROALBUR <0.7 08/29/2015    Assessment/Plan:  Irritant contact dermatitis due to plants, except food  -Likely 2/2 trimming bushes -Patient advised to wash clothes he was wearing that day -Keep area clean and dry -Triamcinolone cream twice daily as needed -Fexofenadine 60 mg daily for the next few days -Given precautions - Plan: triamcinolone cream (KENALOG) 0.1 %, fexofenadine (ALLEGRA ALLERGY) 60 MG tablet  Sutures in the right ear due for removal tomorrow.  F/u as needed  Grier Mitts, MD

## 2021-04-25 NOTE — Patient Instructions (Signed)
Prescription for triamcinolone cream and for Allegra allergy pills were sent to your pharmacy.  You can use these to help with the itching and the rash.

## 2021-05-01 DIAGNOSIS — H90A32 Mixed conductive and sensorineural hearing loss, unilateral, left ear with restricted hearing on the contralateral side: Secondary | ICD-10-CM | POA: Diagnosis not present

## 2021-05-01 DIAGNOSIS — H9113 Presbycusis, bilateral: Secondary | ICD-10-CM | POA: Diagnosis not present

## 2021-05-01 DIAGNOSIS — C07 Malignant neoplasm of parotid gland: Secondary | ICD-10-CM | POA: Diagnosis not present

## 2021-05-08 ENCOUNTER — Ambulatory Visit: Payer: Medicare Other | Admitting: Family Medicine

## 2021-05-09 ENCOUNTER — Ambulatory Visit (INDEPENDENT_AMBULATORY_CARE_PROVIDER_SITE_OTHER): Payer: Medicare Other | Admitting: Family Medicine

## 2021-05-09 VITALS — BP 140/74 | HR 65 | Temp 97.8°F | Wt 142.8 lb

## 2021-05-09 DIAGNOSIS — Z23 Encounter for immunization: Secondary | ICD-10-CM | POA: Diagnosis not present

## 2021-05-09 DIAGNOSIS — S61112A Laceration without foreign body of left thumb with damage to nail, initial encounter: Secondary | ICD-10-CM | POA: Diagnosis not present

## 2021-05-09 NOTE — Patient Instructions (Signed)
Clean daily with soap and water  Continue with topical antibiotic for another few days  Follow up for any signs of infection such as redness, swelling, or pain.

## 2021-05-09 NOTE — Progress Notes (Signed)
Established Patient Office Visit  Subjective:  Patient ID: Shaun Mcmillan, male    DOB: [birth year]  Age: 80 y.o. MRN: [id number]  CC:  Chief Complaint  Patient presents with   thumb injury    Left thumb, cut it last Thursday working on furniture     HPI Shaun Mcmillan presents for left thumb injury which occurred last Thursday.  He was working on a recliner and pushing on the handle when his hand slipped.  His hand went forward and the thumb apparently went into some type of metal bar.  He had laceration of the left thumb which cut into the nail basically avulsing part of the nail.  At this point the laceration seems to be healing but he has a partially attached nail that is getting in the way.  His last tetanus was 10 years ago.  He has not had any puslike drainage.  Minimal pain.  Past Medical History:  Diagnosis Date   Arthritis    Diabetes mellitus type II    Glaucoma    sees Dr. 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 Watt Climes, benign polyps, repeat in 5 yrs   left craniotomy  11/03/08   per Dr. Sherley Bounds for a subdrual hematoma   PAROTIDECTOMY Left 09/20/2020   Procedure: PAROTIDECTOMY with resection of skin and rotational flap;  Surgeon: Izora Gala, MD;  Location: [facility];  Service: ENT;  Laterality: Left;   surgery for ocmpound fracture     right leg    Family History  Problem Relation Age of Onset   Heart disease Other     Social History   Socioeconomic History   Marital status: Married    Spouse name: Not on file   Number of children: Not on file   Years of education: Not on file   Highest education level: Bachelor's degree (e.g., BA, AB, BS)  Occupational History   Not  on file  Tobacco Use   Smoking status: Never   Smokeless tobacco: Never  Vaping Use   Vaping Use: Never used  Substance and Sexual Activity   Alcohol use: Not Currently    Alcohol/week: 1.0 standard drink    Types: 1 Standard drinks or equivalent per week   Drug use: No   Sexual activity: Not on file  Other Topics Concern   Not on file  Social History Narrative   Not on file   Social Determinants of Health   Financial Resource Strain: Low Risk    Difficulty of Paying Living Expenses: Not hard at all  Food Insecurity: No Food Insecurity   Worried About Charity fundraiser in the Last Year: Never true   Ran Out of Food in the Last Year: Never true  Transportation Needs: No Transportation Needs   Lack of Transportation (Medical): No   Lack of Transportation (Non-Medical): No  Physical Activity: Insufficiently Active   Days of Exercise per Week: 2 days   Minutes of Exercise per Session: 30 min  Stress: No Stress Concern Present   Feeling of Stress : Not at all  Social Connections:  Socially Integrated   Frequency of Communication with Friends and Family: Twice a week   Frequency of Social Gatherings with Friends and Family: Twice a week   Attends Religious Services: More than 4 times per year   Active Member of [employer] or Organizations: Yes   Attends Music therapist: More than 4 times per year   Marital Status: Married  Human resources officer Violence: Not on file    Outpatient Medications Prior to Visit  Medication Sig Dispense Refill   acetaminophen (TYLENOL) 500 MG tablet Take 1,000 mg by mouth every 8 (eight) hours as needed for moderate pain.     amLODipine (NORVASC) 10 MG tablet TAKE 1 TABLET BY MOUTH  DAILY 90 tablet 1   aspirin EC 81 MG tablet Take 1 tablet (81 mg total) by mouth daily. Swallow whole. 30 tablet 11   atorvastatin (LIPITOR) 40 MG tablet TAKE 1 TABLET BY MOUTH  DAILY 90 tablet 1   Cholecalciferol (VITAMIN D PO) Take 5,000 Units by mouth daily.      fexofenadine (ALLEGRA ALLERGY) 60 MG tablet Take 1 tablet (60 mg total) by mouth daily. 30 tablet 0   gabapentin (NEURONTIN) 300 MG capsule Take 1 capsule (300 mg total) by mouth 3 (three) times daily. 90 capsule 3   latanoprost (XALATAN) 0.005 % ophthalmic solution Place 1 drop into both eyes every morning.     LORazepam (ATIVAN) 1 MG tablet Take 1 tablet (1 mg total) by mouth 3 (three) times daily as needed for anxiety. 60 tablet 5   Melatonin 5 MG TABS Take 5 mg by mouth at bedtime.     metFORMIN (GLUCOPHAGE) 500 MG tablet TAKE 1 TABLET BY MOUTH  TWICE DAILY WITH A MEAL 180 tablet 1   Multiple Vitamin (MULTIVITAMIN) tablet Take 1 tablet by mouth daily.     pentoxifylline (TRENTAL) 400 MG CR tablet Take 400mg  daily x 1 week, then 400mg  BID; take with food 60 tablet 5   ramipril (ALTACE) 10 MG capsule TAKE 1 CAPSULE BY MOUTH  TWICE DAILY 180 capsule 1   timolol (BETIMOL) 0.5 % ophthalmic solution Place 1 drop into both eyes daily.     triamcinolone cream (KENALOG) 0.1 % Apply 1 application topically 2 (two) times daily. 30 g 0   vitamin E 180 MG (400 UNITS) capsule Take 400 IU daily x 1 week, then 400 IU BID 60 capsule 5   No facility-administered medications prior to visit.    No Known Allergies  ROS Review of Systems  Constitutional:  Negative for chills and fever.     Objective:    Physical Exam Vitals reviewed.  Cardiovascular:     Rate and Rhythm: Normal rate.  Skin:    Comments: Left thumb reveals laceration distally on the border next to the index finger extending from the distal thumb into the nail.  Very lateral border the nail is partially detached but still anchored in into the skin on the lateral border.  No foreign bodies noted.  No erythema.  No puslike drainage.  Neurological:     Mental Status: He is alert.    BP 140/74 (BP Location: Left Arm, Patient Position: Sitting, Cuff Size: Normal)   Pulse 65   Temp 97.8 F (36.6 C) (Oral)   Wt 142 lb 12.8 oz (64.8  kg)   SpO2 99%   BMI 21.09 kg/m  Wt Readings from Last 3 Encounters:  05/09/21 142 lb 12.8 oz (64.8 kg)  04/25/21 143 lb (64.9 kg)  03/20/21 142 lb 6.4 oz (64.6 kg)     Health Maintenance Due  Topic Date Due   FOOT EXAM  Never done   Zoster Vaccines- Shingrix (1 of 2) Never done   OPHTHALMOLOGY EXAM  01/30/2017   TETANUS/TDAP  04/02/2021    There are no preventive care reminders to display for this patient.  Lab Results  Component Value Date   TSH 1.06 01/08/2021   Lab Results  Component Value Date   WBC 5.9 01/08/2021   HGB 14.0 01/08/2021   HCT 41.3 01/08/2021   MCV 89.4 01/08/2021   PLT 248 01/08/2021   Lab Results  Component Value Date   NA 141 01/08/2021   K 4.0 01/08/2021   CO2 29 01/08/2021   GLUCOSE 143 (H) 01/08/2021   BUN 16 03/19/2021   CREATININE 0.81 03/19/2021   BILITOT 0.8 02/10/2020   ALKPHOS 102 12/22/2018   AST 18 02/10/2020   ALT 18 02/10/2020   PROT 7.0 02/10/2020   ALBUMIN 4.6 12/22/2018   CALCIUM 10.1 01/08/2021   GFR 92.14 12/22/2018   Lab Results  Component Value Date   CHOL 136 02/10/2020   Lab Results  Component Value Date   HDL 59 02/10/2020   Lab Results  Component Value Date   LDLCALC 61 02/10/2020   Lab Results  Component Value Date   TRIG 84 02/10/2020   Lab Results  Component Value Date   CHOLHDL 2.3 02/10/2020   Lab Results  Component Value Date   HGBA1C 6.0 (H) 01/08/2021      Assessment & Plan:   Recent laceration left thumb with partial evulsion of nail.  Nail portion is still attached and getting "in the way".  No signs of secondary infection.  -Recommend tetanus booster as this has been 10 years -We discussed risk and benefits of digital nerve block of the left thumb and removal of the partially avulsed nail.  Patient consented.  We performed digital block with 1% plain Xylocaine using 25-gauge 1 inch needle.  Patient tolerated well.  After achieving full nerve block we used some straight hemostats  and were able to free up the avulsed portion of nail that was partially attached.  This was removed without difficulty.  There is minimal bleeding which was controlled with direct pressure for couple minutes.  Partially avulsed portion of nail was removed entirely.  No purulent drainage.  Laceration appears to be healing well.  Topical antibiotic and dressing applied  -Clean daily with soap and water -Continue topical antibiotic for the next few days and follow-up promptly for any signs of secondary infection  No orders of the defined types were placed in this encounter.   Follow-up: No follow-ups on file.    Carolann Littler, MD

## 2021-05-16 ENCOUNTER — Other Ambulatory Visit: Payer: Self-pay | Admitting: Family Medicine

## 2021-05-28 ENCOUNTER — Encounter: Payer: Self-pay | Admitting: Family Medicine

## 2021-05-28 ENCOUNTER — Ambulatory Visit (INDEPENDENT_AMBULATORY_CARE_PROVIDER_SITE_OTHER): Payer: Medicare Other | Admitting: Family Medicine

## 2021-05-28 VITALS — BP 130/78 | HR 66 | Temp 97.8°F | Wt 141.0 lb

## 2021-05-28 DIAGNOSIS — R202 Paresthesia of skin: Secondary | ICD-10-CM | POA: Diagnosis not present

## 2021-05-28 DIAGNOSIS — R200 Anesthesia of skin: Secondary | ICD-10-CM | POA: Diagnosis not present

## 2021-05-28 NOTE — Progress Notes (Signed)
   Subjective:    Patient ID: Shaun Mcmillan, male    DOB: Jul 05, 1940, 80 y.o.   MRN: [id number]  HPI Here to follow up on numbness in the left hand. This involves all 5 fingers and the dorsal hand. No weakness. We have tried him on Gabapentin 300 mg TID, but this has not helped. He also saw Dr. Isidore Moos for a follow up visit on 03-20-21, and she started him on Trental 400 mg BID. This has not helped either. 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    Review of Systems  Constitutional: Negative.   Respiratory: Negative.    Cardiovascular: Negative.   Neurological:  Positive for numbness. Negative for weakness.      Objective:   Physical Exam Constitutional:      Appearance: Normal appearance. He is not ill-appearing.  Cardiovascular:     Rate and Rhythm: Normal rate and regular rhythm.     Pulses: Normal pulses.     Heart sounds: Normal heart sounds.  Pulmonary:     Effort: Pulmonary effort is normal.     Breath sounds: Normal breath sounds.  Musculoskeletal:     Comments: Left hand is warm and pink. The radial pulse is full. Motor is 5/5.   Neurological:     Mental Status: He is alert.          Assessment & Plan:  Left hand numbness. We will set up a nerve conduction test on the left arm soon.  Alysia Penna, MD

## 2021-06-06 ENCOUNTER — Encounter: Payer: Self-pay | Admitting: Neurology

## 2021-06-06 ENCOUNTER — Telehealth: Payer: Self-pay | Admitting: Family Medicine

## 2021-06-06 DIAGNOSIS — R200 Anesthesia of skin: Secondary | ICD-10-CM

## 2021-06-06 NOTE — Telephone Encounter (Signed)
On 05/28/21 office visit, see for  left arm numbness

## 2021-06-06 NOTE — Telephone Encounter (Signed)
I did the referral 

## 2021-06-06 NOTE — Telephone Encounter (Signed)
Patient called because he needs referral to be sent to Neurology so he can get scheduled for nerve conduction test. Patient previously spoke to Dr.Fry about this during a visit, but referral hasn't been placed.      Please advise

## 2021-06-06 NOTE — Telephone Encounter (Signed)
Spoke with patient.   Appointment scheduled with Dr. Posey Pronto in Neurology 07/02/2021

## 2021-06-07 ENCOUNTER — Other Ambulatory Visit: Payer: Self-pay

## 2021-06-07 DIAGNOSIS — R202 Paresthesia of skin: Secondary | ICD-10-CM

## 2021-06-20 ENCOUNTER — Telehealth: Payer: Self-pay | Admitting: *Deleted

## 2021-06-20 NOTE — Telephone Encounter (Signed)
CALLED PATIENT TO INFORM THAT SCANS HAVE NOT PRE-AUTH YET, PATIENT VERIFIED UNDERSTANDING THIS

## 2021-06-26 DIAGNOSIS — C44329 Squamous cell carcinoma of skin of other parts of face: Secondary | ICD-10-CM | POA: Diagnosis not present

## 2021-06-26 DIAGNOSIS — L570 Actinic keratosis: Secondary | ICD-10-CM | POA: Diagnosis not present

## 2021-06-26 DIAGNOSIS — C4442 Squamous cell carcinoma of skin of scalp and neck: Secondary | ICD-10-CM | POA: Diagnosis not present

## 2021-06-28 ENCOUNTER — Encounter: Payer: Self-pay | Admitting: Family Medicine

## 2021-06-29 NOTE — Telephone Encounter (Signed)
I totally agree with him getting this done

## 2021-07-05 ENCOUNTER — Ambulatory Visit: Payer: Medicare Other | Admitting: Neurology

## 2021-07-05 ENCOUNTER — Other Ambulatory Visit: Payer: Self-pay

## 2021-07-05 DIAGNOSIS — R202 Paresthesia of skin: Secondary | ICD-10-CM | POA: Diagnosis not present

## 2021-07-05 DIAGNOSIS — G629 Polyneuropathy, unspecified: Secondary | ICD-10-CM

## 2021-07-05 NOTE — Procedures (Signed)
[facility]  [address]  [place], [place] [address] Tel: [phone] Fax:  [phone] Test Date:  07/05/2021  Patient: Shaun Mcmillan DOB: March 12, 1941 Physician: Narda Amber, DO  Sex: Male Height: 5\' 9"  Ref Phys: Alysia Penna, M.D.  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  NCV & EMG Findings: Extensive electrodiagnostic testing of the left upper extremity shows:  Left median and ulnar sensory responses are absent.  Left radial sensory response shows reduced amplitude (9.7 V).   Left median motor response shows prolonged latency (4.5 ms) and decreased conduction velocity (Elbow-Wrist, 46 m/s).  Left ulnar motor response shows prolonged latency (5.8 ms), reduced amplitude (5.1 mV), decreased conduction velocity (B Elbow-Wrist, 47 m/s), and decreased conduction velocity (A Elbow-B Elbow, 37 m/s).   Chronic motor axonal loss changes are seen affecting the distal hand muscles, without accompanied active denervation.    Impression: The electrophysiologic findings are consistent with a chronic sensorimotor polyneuropathy, with demyelinating and axonal features, affecting the left upper extremity.  Overall, these findings are moderate-to-severe in degree electrically.   ___________________________ Narda Amber, DO    Nerve Conduction Studies Anti Sensory Summary Table   Stim Site NR Peak (ms) Norm Peak (ms) P-T Amp (V) Norm P-T Amp  Left Median Anti Sensory (2nd Digit)  32C  Wrist NR  <3.8  >10  Left Radial Anti Sensory (Base 1st Digit)  32C  Wrist    2.7 <2.8 9.7 >10  Left Ulnar Anti Sensory (5th Digit)  32C  Wrist NR  <3.2  >5   Motor Summary Table   Stim Site NR Onset (ms) Norm Onset (ms) O-P Amp (mV) Norm O-P Amp Site1 Site2 Delta-0 (ms) Dist (cm) Vel (m/s) Norm  Vel (m/s)  Left Median Motor (Abd Poll Brev)  32C  Wrist    4.5 <4.0 9.2 >5 Elbow Wrist 6.8 31.0 46 >50  Elbow    11.3  8.3         Left Ulnar Motor (Abd Dig Minimi)  32C  Wrist    5.8 <3.1 5.1 >7 B Elbow Wrist 5.3 25.0 47 >50  B Elbow    11.1  3.0  A Elbow B Elbow 2.7 10.0 37 >50  A Elbow    13.8  2.6          EMG   Side Muscle Ins Act Fibs Psw Fasc Number Recrt Dur Dur. Amp Amp. Poly Poly. Comment  Left 1stDorInt Nml Nml Nml Nml 1- Rapid Few 1+ Few 1+ Few 1+ N/A  Left Abd Poll Brev Nml Nml Nml Nml 1- Rapid Few 1+ Few 1+ Few 1+ N/A  Left Ext Indicis Nml Nml Nml Nml 1- Rapid Few 1+ Few 1+ Few 1+ N/A  Left PronatorTeres Nml Nml Nml Nml Nml Nml Nml Nml Nml Nml Nml Nml N/A  Left Biceps Nml Nml Nml Nml Nml Nml Nml Nml Nml Nml Nml Nml N/A  Left Triceps Nml Nml Nml Nml Nml Nml Nml Nml Nml Nml Nml Nml N/A  Left Deltoid Nml Nml Nml Nml Nml Nml Nml Nml Nml Nml Nml Nml N/A  Left Infraspinatus Nml Nml Nml Nml Nml Nml Nml Nml Nml Nml Nml Nml N/A      Waveforms:

## 2021-07-06 ENCOUNTER — Telehealth: Payer: Self-pay | Admitting: Family Medicine

## 2021-07-06 DIAGNOSIS — H9192 Unspecified hearing loss, left ear: Secondary | ICD-10-CM | POA: Diagnosis not present

## 2021-07-06 DIAGNOSIS — H9113 Presbycusis, bilateral: Secondary | ICD-10-CM | POA: Diagnosis not present

## 2021-07-06 DIAGNOSIS — H6191 Disorder of right external ear, unspecified: Secondary | ICD-10-CM | POA: Diagnosis not present

## 2021-07-06 DIAGNOSIS — C07 Malignant neoplasm of parotid gland: Secondary | ICD-10-CM | POA: Diagnosis not present

## 2021-07-06 NOTE — Telephone Encounter (Signed)
Left message for patient to call back and schedule Medicare Annual Wellness Visit (AWV) either virtually or in office. Left  my Herbie Drape number [phone]   Last AWV I 04/13/20 please schedule at anytime with [facility] Nurse Health Advisor 1 or 2   This should be a 45 minute visit.

## 2021-07-06 NOTE — Addendum Note (Signed)
Addended by: Alysia Penna A on: 07/06/2021 08:15 AM   Modules accepted: Orders

## 2021-07-09 ENCOUNTER — Telehealth: Payer: Self-pay | Admitting: Family Medicine

## 2021-07-09 NOTE — Telephone Encounter (Signed)
Please advise 

## 2021-07-09 NOTE — Telephone Encounter (Signed)
Yes I want him to see the Neurologist because something is affecting his nerve function and we need to find out what it is

## 2021-07-09 NOTE — Telephone Encounter (Signed)
Pt is calling and had nerve conduct test at [facility] and they have sch an appt with dr patel in march 2023. Pt would like to ask dr fry is that necessary it is for numbness and tingling in left hand

## 2021-07-10 NOTE — Telephone Encounter (Signed)
Spoke with patient message given.

## 2021-07-10 NOTE — Progress Notes (Signed)
°[facility] °Neurology Division °Clinic Note - Initial Visit ° ° °Date: 07/11/2021 ° °Shaun Mcmillan °MRN: [id number] °DOB: 08/19/1940 ° ° °Dear Dr.Fry: ° °Thank you for your kind referral of Shaun Mcmillan for consultation of left hand numbness. Although his history is well known to you, please allow us to reiterate it for the purpose of our medical record. The patient was accompanied to the clinic by wife who also provides collateral information.  °  ° °History of Present Illness: °Shaun Mcmillan is a 80 y.o. 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  ° °Starting in early 2021, he first started having numbness involving the last two fingers, which progressed into the first three fingers.  He has constant numbness and tingling involving the entire hand, palm, and back of the hand. No exacerbating or alleviating factors.  He takes gabapentin 300mg three times daily which has not provided any relief. 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 Of note, he has left facial weakness following the surgery and reduced hearing on the left.  Over the summer, he completed radiation to the head and neck. Symptoms have not progressed since the summer, but there has been no improvement.  His radiation oncologist started him on pentoxifyline. No associated weakness or neck pain. He has left sided shoulder pain which is described as soreness. 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 He is here for further evaluation. ° °He denies numbness/tingling of the right hand.  He has tingling involving the right great toe which has been present for 1 year.  He  walks unassisted, no falls or imbalance. ° °Out-side paper records, electronic medical record, and images have been reviewed where available and summarized as:  °NCS/EMG of the left arm 07/05/2021: °The electrophysiologic findings are consistent with a chronic sensorimotor polyneuropathy, with demyelinating and axonal features, affecting the left upper extremity.  Overall, these findings are moderate-to-severe in degree electrically. °  °Lab Results  °Component Value Date  ° HGBA1C 6.0 (H) 01/08/2021  ° °Lab Results  °Component Value Date  ° VITAMINB12 761 01/08/2021  ° °Lab Results  °Component Value Date  ° TSH 1.06 01/08/2021  ° °Lab Results  °Component Value Date  ° ESRSEDRATE 7 07/11/2021  ° ° °Past Medical History:  °Diagnosis Date  ° Arthritis   ° Diabetes mellitus type II   ° Glaucoma   ° sees Dr. 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 Magod, benign polyps, repeat in 5 yrs  ° left craniotomy  11/03/08  ° per Dr. David Jones for a subdrual hematoma  ° PAROTIDECTOMY Left 09/20/2020  ° Procedure: PAROTIDECTOMY with resection of skin and rotational flap;  Surgeon: Rosen, Jefry, MD;  Location: [facility] OR;  Service: ENT;  Laterality: Left;  ° surgery for ocmpound fracture    ° right leg  ° ° ° °  Medications:  Outpatient Encounter Medications as of 07/11/2021  Medication Sig   acetaminophen (TYLENOL) 500 MG tablet Take 1,000 mg by mouth every 8 (eight) hours as needed for moderate pain.   amLODipine (NORVASC) 10 MG tablet TAKE 1 TABLET BY MOUTH  DAILY   aspirin EC 81 MG tablet Take 1 tablet (81 mg total) by mouth daily. Swallow whole.   atorvastatin (LIPITOR) 40 MG tablet TAKE 1 TABLET BY MOUTH  DAILY   Cholecalciferol (VITAMIN  D PO) Take 5,000 Units by mouth daily.   latanoprost (XALATAN) 0.005 % ophthalmic solution Place 1 drop into both eyes every morning.   LORazepam (ATIVAN) 1 MG tablet Take 1 tablet (1 mg total) by mouth 3 (three) times daily as needed for anxiety.   Melatonin 5 MG TABS Take 5 mg by mouth at bedtime.   metFORMIN (GLUCOPHAGE) 500 MG tablet TAKE 1 TABLET BY MOUTH  TWICE DAILY WITH A MEAL   Multiple Vitamin (MULTIVITAMIN) tablet Take 1 tablet by mouth daily.   pentoxifylline (TRENTAL) 400 MG CR tablet Take 485m daily x 1 week, then 4025mBID; take with food   ramipril (ALTACE) 10 MG capsule TAKE 1 CAPSULE BY MOUTH  TWICE DAILY   timolol (BETIMOL) 0.5 % ophthalmic solution Place 1 drop into both eyes daily.   vitamin E 180 MG (400 UNITS) capsule Take 400 IU daily x 1 week, then 400 IU BID   [DISCONTINUED] gabapentin (NEURONTIN) 300 MG capsule Take 1 capsule (300 mg total) by mouth 3 (three) times daily.   gabapentin (NEURONTIN) 300 MG capsule Take 2 capsules (600 mg total) by mouth 3 (three) times daily.   [DISCONTINUED] fexofenadine (ALLEGRA ALLERGY) 60 MG tablet Take 1 tablet (60 mg total) by mouth daily. (Patient not taking: Reported on 07/11/2021)   [DISCONTINUED] triamcinolone cream (KENALOG) 0.1 % Apply 1 application topically 2 (two) times daily. (Patient not taking: Reported on 07/11/2021)   No facility-administered encounter medications on file as of 07/11/2021.    Allergies: No Known Allergies  Family History: Family History  Problem Relation Age of Onset   Cancer Mother    Heart disease Other     Social History: Social History   Tobacco Use   Smoking status: Never   Smokeless tobacco: Never  Vaping Use   Vaping Use: Never used  Substance Use Topics   Alcohol use: Not Currently    Alcohol/week: 1.0 standard drink    Types: 1 Standard drinks or equivalent per week   Drug use: No   Social History   Social History Narrative   Right Handed    Lives in a one story home     Vital Signs:  BP (!) 148/77    Pulse 79    Ht 5' 10" (1.778 m)    Wt 140 lb (63.5 kg)    SpO2 96%    BMI 20.09 kg/m   Neurological Exam: MENTAL STATUS including orientation to time, place, person, recent and remote memory, attention span and concentration, language, and fund of knowledge is normal.  Speech is not dysarthric.  CRANIAL NERVES: II:  No visual field defects.   III-IV-VI: Pupils equal round and reactive to light.  Normal conjugate, extra-ocular eye movements in all directions of gaze.  No nystagmus.  No ptosis.   V:  Normal facial sensation.    VII:  Right facial strength is normal. Dense left facial weakness with frontalis 0/5, orbicularis oculi and oris 3/5, buccinator 3/5.   Left facial asymmetric from  prior craniotomy and parotid resection. °VIII:  Reduced hearing on the left to finger rub.   °IX-X:  Normal palatal movement.   °XI:  Normal shoulder shrug and head rotation.   °XII:  Normal tongue strength and range of motion, no deviation or fasciculation. ° °MOTOR:  Thin appearing with generalized loss of muscle bulk throughout.  No focal atrophy, fasciculations or abnormal movements.  No pronator drift.  ° °Upper Extremity:  Right  Left  °Deltoid  5/5   5/5   °Biceps  5/5   5/5   °Triceps  5/5   5/5   °Infraspinatus 5/5  5/5  °Medial pectoralis 5/5  5/5  °Wrist extensors  5/5   5/5   °Wrist flexors  5/5   5/5   °Finger extensors  5/5   5/5   °Finger flexors  5/5   5/5   °Dorsal interossei  5/5   5/5   °Abductor pollicis  5/5   5/5   °Tone (Ashworth scale)  0  0  ° °Lower Extremity:  Right  Left  °Hip flexors  5/5   5/5   °Hip extensors  5/5   5/5   °Adductor 5/5  5/5  °Abductor 5/5  5/5  °Knee flexors  5/5   5/5   °Knee extensors  5/5   5/5   °Dorsiflexors  5/5   5/5   °Plantarflexors  5/5   5/5   °Toe extensors  5/5   5/5   °Toe flexors  5/5   5/5   °Tone (Ashworth scale)  0  0  ° °MSRs:  °Right        Left                  °brachioradialis 2+  2+  °biceps 2+  2+  °triceps 2+  2+   °patellar 2+  2+  °ankle jerk 2+  2+  °Hoffman no  no  °plantar response down  down  ° °SENSORY:  Normal and symmetric perception of light touch, pinprick, vibration, and proprioception.  Romberg's sign absent.  ° °COORDINATION/GAIT: Normal finger-to- nose-finger.  Intact rapid alternating movements bilaterally.  Gait is stooped, narrow based and stable. Tandem and stressed gait intact.  ° ° °IMPRESSION: °Left hand numbness, progressive since early 2022.  NCS/EMG reviewed with patient which shows sensory > motor abnormalities with demyelinating and axonal features.  The asymmetry of symptoms is atypical for neuropathy and warrants further evaluation for polyradiculoneuropathy, such as CIDP.  Lack of pain makes vasculitic neuropathy less likely. With his history of cancer, paraneoplastic neuropathy also is considered. This is unlikely to be radiation-induced neuropathy as symptoms preceded radiation therapy.  EMG did not demonstrate myokymic discharges, which can be seen in radiation-induced plexopathy.  To further investigate the etiology of his symptoms, I will order NCS/EMG of the right arm and leg to understand degree of involvement, check neuropathy labs, and MRI cervical spine wwo contrast to determine if there is any nerve root enhancement.  ° °PLAN/RECOMMENDATIONS:  °MRI cervical spine wwo contrast °NCS/EMG of right arm and leg °Check labs ESR, CRP, folate, copper, SPEP with IFE, ANCA, vitamin B1 ° °Further recommendations pending results. ° °Total time spent reviewing records, interview, history/exam, documentation, and coordination of care on day of encounter:  50 min ° ° °Thank you for allowing me to participate in patient's care.  If I can answer any additional questions, I would be pleased to do so.   ° °Sincerely, ° ° ° °  Donika K. Patel, DO ° °

## 2021-07-11 ENCOUNTER — Ambulatory Visit: Payer: Medicare Other | Admitting: Neurology

## 2021-07-11 ENCOUNTER — Other Ambulatory Visit (INDEPENDENT_AMBULATORY_CARE_PROVIDER_SITE_OTHER): Payer: Medicare Other

## 2021-07-11 ENCOUNTER — Other Ambulatory Visit: Payer: Self-pay

## 2021-07-11 ENCOUNTER — Encounter: Payer: Self-pay | Admitting: Neurology

## 2021-07-11 VITALS — BP 148/77 | HR 79 | Ht 70.0 in | Wt 140.0 lb

## 2021-07-11 DIAGNOSIS — G629 Polyneuropathy, unspecified: Secondary | ICD-10-CM | POA: Diagnosis not present

## 2021-07-11 DIAGNOSIS — C07 Malignant neoplasm of parotid gland: Secondary | ICD-10-CM

## 2021-07-11 DIAGNOSIS — G610 Guillain-Barre syndrome: Secondary | ICD-10-CM | POA: Diagnosis not present

## 2021-07-11 DIAGNOSIS — R292 Abnormal reflex: Secondary | ICD-10-CM

## 2021-07-11 LAB — C-REACTIVE PROTEIN: CRP: <1 mg/dL (ref 0.5–20.0)

## 2021-07-11 LAB — SEDIMENTATION RATE: Sed Rate: 7 mm/hr (ref 0–20)

## 2021-07-11 MED ORDER — GABAPENTIN 300 MG PO CAPS
600.0000 mg | ORAL_CAPSULE | Freq: Three times a day (TID) | ORAL | 3 refills | Status: DC
Start: 2021-07-11 — End: 2021-11-05

## 2021-07-11 NOTE — Patient Instructions (Signed)
Check labs  MRI cervical spine wwo contrast  Nerve testing of the right arm and leg

## 2021-07-12 ENCOUNTER — Ambulatory Visit: Payer: Medicare Other | Admitting: Neurology

## 2021-07-12 DIAGNOSIS — C07 Malignant neoplasm of parotid gland: Secondary | ICD-10-CM

## 2021-07-12 DIAGNOSIS — G629 Polyneuropathy, unspecified: Secondary | ICD-10-CM

## 2021-07-12 DIAGNOSIS — R292 Abnormal reflex: Secondary | ICD-10-CM

## 2021-07-12 DIAGNOSIS — G610 Guillain-Barre syndrome: Secondary | ICD-10-CM | POA: Diagnosis not present

## 2021-07-12 NOTE — Procedures (Signed)
[facility] Neurology  [address]  [place], [place] [address] Tel: [phone] Fax:  [phone] Test Date:  07/12/2021  Patient: Shaun Mcmillan DOB: [birth year] Physician: Narda Amber, DO  Sex: Male Height: 5\' 10"  Ref Phys: Narda Amber, DO  ID#: [id number]   Technician:    Patient Complaints: This is a 81 year old man referred for evaluation of neuropathy.  NCV & EMG Findings: Electrodiagnostic testing of the right upper and lower extremity shows:  Right median and ulnar sensory responses show prolonged latency (R3.6, R5.4 ms) and normal amplitude.  Right radial sensory response is within normal limits.  Right sural and superficial peroneal sensory responses are absent. Right median and ulnar motor responses show prolonged latency (R4.3, R4.3 ms).  Right peroneal (EDB) and tibial motor responses are absent.  Right peroneal motor response at the tibialis anterior is within normal limits. Right tibial H reflex study is absent.  Right ulnar F wave shows prolonged latency. Chronic motor axonal loss changes are seen affecting the muscles below the knee, without accompanying active denervation.  The remaining tested muscles including the distal hand muscles are within normal limits.  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   ___________________________ Narda Amber, DO    Nerve Conduction Studies Anti Sensory Summary Table   Stim Site NR Peak (ms) Norm Peak (ms) P-T Amp (V) Norm P-T Amp  Right Median Anti Sensory (2nd Digit)  32C  Wrist    3.9 <3.8 10.5 >10  Right Radial Anti Sensory (Base 1st Digit)  32C  Wrist    2.4 <2.8 17.9 >10  Right Sup Peroneal Anti Sensory (Ant Lat Mall)  32C  12 cm NR  <4.6  >3  Right Sural Anti Sensory (Lat Mall)  32C  Calf NR  <4.6  >3  Right Ulnar Anti Sensory (5th Digit)  32C  Wrist    5.4 <3.2 10.0 >5    Motor Summary Table   Stim Site NR Onset (ms) Norm Onset (ms) O-P Amp (mV) Norm O-P Amp Site1 Site2 Delta-0 (ms) Dist (cm) Vel (m/s) Norm Vel (m/s)  Right Median Motor (Abd Poll Brev)  32C  Wrist    4.3 <4.0 8.1 >5 Elbow Wrist 5.5 31.0 56 >50  Elbow    9.8  7.6         Right Peroneal Motor (Ext Dig Brev)  32C  Ankle NR  <6.0  >2.5 B Fib Ankle  0.0  >40  B Fib NR     Poplt B Fib  0.0  >40  Poplt NR            Right Peroneal TA Motor (Tib Ant)  32C  Fib Head    4.5 <4.5 3.1 >3 Poplit Fib Head 2.0 8.0 40 >40  Poplit    6.5  2.8         Right Tibial Motor (Abd Hall Brev)  32C  Ankle NR  <6.0  >4 Knee Ankle  0.0  >40  Knee NR            Right Ulnar Motor (Abd Dig Minimi)  32C  Wrist    4.3 <3.1 9.8 >7 B Elbow Wrist 5.2 26.0 50 >50  B Elbow    9.5  7.3  A Elbow B Elbow 2.0 10.0 50 >50  A Elbow    11.5  6.5          F Wave  Studies   NR F-Lat (ms) Lat Norm (ms) L-R F-Lat (ms)  Right Ulnar (Mrkrs) (Abd Dig Min)  32C     35.17 <33    H Reflex Studies   NR H-Lat (ms) Lat Norm (ms) L-R H-Lat (ms)  Right Tibial (Gastroc)  32C  NR  <35    EMG   Side Muscle Ins Act Fibs Psw Fasc Number Recrt Dur Dur. Amp Amp. Poly Poly. Comment  Right AntTibialis Nml Nml Nml Nml 2- Rapid Many 1+ Many 1+ Many 1+ N/A  Right Gastroc Nml Nml Nml Nml 2- Rapid Many 1+ Many 1+ Many 1+ N/A  Right Flex Dig Long Nml Nml Nml Nml 2- Rapid Many 1+ Many 1+ Many 1+ N/A  Right RectFemoris Nml Nml Nml Nml Nml Nml Nml Nml Nml Nml Nml Nml N/A  Right GluteusMed Nml Nml Nml Nml Nml Nml Nml Nml Nml Nml Nml Nml N/A  Right BicepsFemS Nml Nml Nml Nml Nml Nml Nml Nml Nml Nml Nml Nml N/A  Right 1stDorInt Nml Nml Nml Nml Nml Nml Nml Nml Nml Nml Nml Nml N/A  Right Abd Poll Brev Nml Nml Nml Nml Nml Nml Nml Nml Nml Nml Nml Nml N/A  Right Ext Indicis Nml Nml Nml Nml Nml Nml Nml Nml Nml Nml Nml Nml N/A  Right PronatorTeres Nml Nml Nml Nml Nml Nml Nml Nml Nml Nml Nml Nml N/A  Right Biceps Nml Nml Nml Nml Nml Nml Nml Nml Nml  Nml Nml Nml N/A  Right Triceps Nml Nml Nml Nml Nml Nml Nml Nml Nml Nml Nml Nml N/A  Right Deltoid Nml Nml Nml Nml Nml Nml Nml Nml Nml Nml Nml Nml N/A      Waveforms:

## 2021-07-12 NOTE — Patient Instructions (Signed)
Gabapentin tablets                           Morning       Afternoon        Evening   Week 1            300mg          300mg                  600mg                Week 2            300mg          600mg                  600mg          Continue          600mg         600mg                   600mg 

## 2021-07-13 ENCOUNTER — Telehealth: Payer: Self-pay | Admitting: Neurology

## 2021-07-13 DIAGNOSIS — C07 Malignant neoplasm of parotid gland: Secondary | ICD-10-CM | POA: Diagnosis not present

## 2021-07-13 DIAGNOSIS — H9113 Presbycusis, bilateral: Secondary | ICD-10-CM | POA: Diagnosis not present

## 2021-07-13 NOTE — Telephone Encounter (Signed)
Patient said he is calling about his results.

## 2021-07-16 ENCOUNTER — Ambulatory Visit (HOSPITAL_COMMUNITY)
Admission: RE | Admit: 2021-07-16 | Discharge: 2021-07-16 | Disposition: A | Discharge status: Home / Self Care | Payer: Medicare Other | Source: Ambulatory Visit | Attending: Radiation Oncology | Admitting: Radiation Oncology

## 2021-07-16 ENCOUNTER — Other Ambulatory Visit: Payer: Self-pay

## 2021-07-16 DIAGNOSIS — C07 Malignant neoplasm of parotid gland: Secondary | ICD-10-CM | POA: Diagnosis not present

## 2021-07-16 DIAGNOSIS — I700 Atherosclerosis of aorta: Secondary | ICD-10-CM | POA: Diagnosis not present

## 2021-07-16 DIAGNOSIS — R918 Other nonspecific abnormal finding of lung field: Secondary | ICD-10-CM | POA: Diagnosis not present

## 2021-07-16 DIAGNOSIS — R911 Solitary pulmonary nodule: Secondary | ICD-10-CM | POA: Diagnosis not present

## 2021-07-16 DIAGNOSIS — M47812 Spondylosis without myelopathy or radiculopathy, cervical region: Secondary | ICD-10-CM | POA: Diagnosis not present

## 2021-07-16 DIAGNOSIS — I6529 Occlusion and stenosis of unspecified carotid artery: Secondary | ICD-10-CM | POA: Diagnosis not present

## 2021-07-16 DIAGNOSIS — C760 Malignant neoplasm of head, face and neck: Secondary | ICD-10-CM | POA: Diagnosis not present

## 2021-07-16 LAB — VITAMIN B1: Vitamin B1 (Thiamine): 19 nmol/L (ref 8–30)

## 2021-07-16 LAB — PROTEIN ELECTROPHORESIS, SERUM
Albumin ELP: 4.1 g/dL (ref 3.8–4.8)
Alpha 1: 0.3 g/dL (ref 0.2–0.3)
Alpha 2: 0.7 g/dL (ref 0.5–0.9)
Beta 2: 0.3 g/dL (ref 0.2–0.5)
Beta Globulin: 0.4 g/dL (ref 0.4–0.6)
Gamma Globulin: 1 g/dL (ref 0.8–1.7)
Total Protein: 6.8 g/dL (ref 6.1–8.1)

## 2021-07-16 LAB — IMMUNOFIXATION ELECTROPHORESIS
IgG (Immunoglobin G), Serum: 959 mg/dL (ref 600–1540)
IgM, Serum: 350 mg/dL — ABNORMAL HIGH (ref 50–300)
Immunoglobulin A: 100 mg/dL (ref 70–320)

## 2021-07-16 LAB — COPPER, SERUM: Copper: 102 ug/dL (ref 70–175)

## 2021-07-16 LAB — POCT I-STAT CREATININE: Creatinine, Ser: 0.8 mg/dL (ref 0.61–1.24)

## 2021-07-16 LAB — ANCA SCREEN W REFLEX TITER: ANCA Screen: NEGATIVE

## 2021-07-16 MED ORDER — IOHEXOL 300 MG/ML  SOLN
100.0000 mL | Freq: Once | INTRAMUSCULAR | Status: AC | PRN
  Administered 2021-07-16: 75 mL via INTRAVENOUS

## 2021-07-16 MED ORDER — SODIUM CHLORIDE (PF) 0.9 % IJ SOLN
INTRAMUSCULAR | Status: AC
  Filled 2021-07-16: qty 50

## 2021-07-16 NOTE — Telephone Encounter (Signed)
Called patient and left a message for a call back.  

## 2021-07-16 NOTE — Telephone Encounter (Signed)
Please let him know that EMG shows neuropathy also involving the right arm and leg, not as severe as the left side.  I am still waiting for all the labs to result, once they are in, we will be in touch. Thanks.

## 2021-07-17 NOTE — Telephone Encounter (Signed)
Called patient and informed him of his EMG results. Also informed patient that we are still waiting on lab work to come back and once we have his labs back we will give him a call with more information.  Patient verbalized understanding and had no further questions or concerns.

## 2021-07-18 ENCOUNTER — Telehealth: Payer: Self-pay | Admitting: Neurology

## 2021-07-18 NOTE — Telephone Encounter (Signed)
Called and informed patient that his labs for causes of neuropathy returned nondiagnostic.  Nerve testing of the right side shows neuropathy with demyelinating and axonal features, however, not as severe as the left arm.  I would recommend CSF testing as the next step to look for polyradiculneuropathy. He would like to think about whether to proceed with lumbar puncture or not.

## 2021-07-19 ENCOUNTER — Telehealth: Payer: Self-pay | Admitting: *Deleted

## 2021-07-19 NOTE — Progress Notes (Signed)
[name] presents today for follow-up after completing radiation to his left parotid on 12/19/2020 and to review CT scan results from 07/16/2021  Pain issues, if any: Patient denies Using a feeding tube?: N/A Weight changes, if any:  Wt Readings from Last 3 Encounters:  07/20/21 141 lb (64 kg)  07/11/21 140 lb (63.5 kg)  05/28/21 141 lb (64 kg)   Swallowing issues, if any: Patient denies--reports he can eat/drink a wide variety  Smoking or chewing tobacco? None Using fluoride trays daily? N/A--Reports he sees his community dentist every 4 months. States he had x-rays done at last visit and was told everything "looked good. No cavities" Last ENT visit was on:  07/13/2021 Saw Dr. Izora Gala to recheck a right ear skin lesion that was observed at 07/06/2021 appointment:  "Since I saw him last week the lesion has actually disappeared. On further inspection and palpation there is no residual abnormality. No other suspicious skin findings in the head neck area. Continue to monitor. Recheck in 3 months. Follow-up CAT scan is next week."  07/06/2021 visit --Physical Exam Healthy-appearing gentleman in no distress. Persistent left facial weakness. Ear canal is clear today. The drum looks healthy. No signs of infection. Right ear looks normal. There is a 7 or 8 mm scaly lesion of the right external ear at the helix. No other new lesions. No palpable adenopathy. 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 Await the results of the new CT scan. Hearing loss. Recommend hearing aid evaluation. New skin lesion right external ear. We will schedule a time for removal of this here in the office.   Other notable issues, if any: Denies any lingering fatigue and reports he's sleeping well at night. Denies any noticeable swelling under his chin or down his neck. Current concern is the frequent appointments/imaging to try and discover the cause of his progressing neuropathy. 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

## 2021-07-19 NOTE — Telephone Encounter (Signed)
CALLED PATIENT TO OFFER FU APPT. TOMORROW WITH DR. Isidore Moos @11 :20 AM DUE TO A CANCELLATION, PATIENT AGREED TO COME ON 07-20-21 @ 11:20 AM

## 2021-07-20 ENCOUNTER — Encounter: Payer: Self-pay | Admitting: Dietician

## 2021-07-20 ENCOUNTER — Ambulatory Visit
Admission: RE | Admit: 2021-07-20 | Discharge: 2021-07-20 | Disposition: A | Discharge status: Home / Self Care | Payer: Medicare Other | Source: Ambulatory Visit | Attending: Radiation Oncology | Admitting: Radiation Oncology

## 2021-07-20 ENCOUNTER — Other Ambulatory Visit: Payer: Self-pay

## 2021-07-20 VITALS — BP 143/80 | HR 77 | Temp 97.0°F | Resp 18 | Ht 70.0 in | Wt 141.0 lb

## 2021-07-20 DIAGNOSIS — C7801 Secondary malignant neoplasm of right lung: Secondary | ICD-10-CM

## 2021-07-20 DIAGNOSIS — R918 Other nonspecific abnormal finding of lung field: Secondary | ICD-10-CM | POA: Diagnosis not present

## 2021-07-20 DIAGNOSIS — Z923 Personal history of irradiation: Secondary | ICD-10-CM | POA: Insufficient documentation

## 2021-07-20 DIAGNOSIS — I700 Atherosclerosis of aorta: Secondary | ICD-10-CM | POA: Insufficient documentation

## 2021-07-20 DIAGNOSIS — Z7982 Long term (current) use of aspirin: Secondary | ICD-10-CM | POA: Insufficient documentation

## 2021-07-20 DIAGNOSIS — I2510 Atherosclerotic heart disease of native coronary artery without angina pectoris: Secondary | ICD-10-CM | POA: Insufficient documentation

## 2021-07-20 DIAGNOSIS — C7989 Secondary malignant neoplasm of other specified sites: Secondary | ICD-10-CM

## 2021-07-20 DIAGNOSIS — C07 Malignant neoplasm of parotid gland: Secondary | ICD-10-CM | POA: Diagnosis not present

## 2021-07-20 DIAGNOSIS — Z79899 Other long term (current) drug therapy: Secondary | ICD-10-CM | POA: Insufficient documentation

## 2021-07-20 DIAGNOSIS — G629 Polyneuropathy, unspecified: Secondary | ICD-10-CM | POA: Insufficient documentation

## 2021-07-20 DIAGNOSIS — Z7984 Long term (current) use of oral hypoglycemic drugs: Secondary | ICD-10-CM | POA: Diagnosis not present

## 2021-07-20 DIAGNOSIS — E041 Nontoxic single thyroid nodule: Secondary | ICD-10-CM | POA: Diagnosis not present

## 2021-07-20 DIAGNOSIS — C7802 Secondary malignant neoplasm of left lung: Secondary | ICD-10-CM | POA: Diagnosis not present

## 2021-07-20 DIAGNOSIS — M47812 Spondylosis without myelopathy or radiculopathy, cervical region: Secondary | ICD-10-CM | POA: Insufficient documentation

## 2021-07-20 NOTE — Progress Notes (Signed)
Provided one complimentary case of Ensure Plus High Protein 

## 2021-07-23 ENCOUNTER — Encounter: Payer: Self-pay | Admitting: Radiation Oncology

## 2021-07-23 DIAGNOSIS — C7800 Secondary malignant neoplasm of unspecified lung: Secondary | ICD-10-CM | POA: Insufficient documentation

## 2021-07-23 NOTE — Progress Notes (Addendum)
Radiation Oncology         [phone] ________________________________  Name: Shaun Mcmillan MRN: [id number]  Date: 07/20/2021  DOB: [birth year]  Follow-Up Visit Note  CC: Shaun Morale, MD  Shaun Gala, MD  Diagnosis and Prior Radiotherapy:       ICD-10-CM   1. 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 [facility]) Staging form: Cutaneous Carcinoma of the Head and Neck, AJCC 8th Edition - Clinical stage from 10/02/2020: Stage Unknown (cTX, cN3b) - Unsigned Stage prefix: Initial diagnosis Extraosseous extension: Unknown  Now with what appears to be STAGE IV metastatic disease (to lungs)  Radiation Treatment Dates: 11/01/2020 through 12/19/2020 Site Technique Total Dose (Gy) Dose per Fx (Gy) Completed Fx Beam Energies  Parotid, Left: HN_Lt_parotid IMRT 66/66 2 33/33 6X    CHIEF COMPLAINT:  Here for follow-up and surveillance of parotid cancer  Narrative:   Shaun Mcmillan presents today for follow-up after completing radiation to his left parotid on 12/19/2020 and to review CT scan results from 07/16/2021  Pain issues, if any: Patient denies Using a feeding tube?: N/A Weight changes, if any:  Wt Readings from Last 3 Encounters:  07/20/21 141 lb (64 kg)  07/11/21 140 lb (63.5 kg)  05/28/21 141 lb (64 kg)   Swallowing issues, if any: Patient denies--reports he can eat/drink a wide variety  Smoking or chewing tobacco? None Using fluoride trays daily? N/A--Reports he sees his community dentist every 4 months. States he had x-rays done at last visit and was told everything "looked good. No cavities" Last ENT visit was on:  07/13/2021 Saw Dr. Izora Mcmillan to recheck a right ear skin lesion that was observed at 07/06/2021 appointment:  "Since I saw him last week the lesion has actually disappeared. On further  inspection and palpation there is no residual abnormality. No other suspicious skin findings in the head neck area. Continue to monitor. Recheck in 3 months. Follow-up CAT scan is next week."  07/06/2021 visit --Physical Exam Healthy-appearing gentleman in no distress. Persistent left facial weakness. Ear canal is clear today. The drum looks healthy. No signs of infection. Right ear looks normal. There is a 7 or 8 mm scaly lesion of the right external ear at the helix. No other new lesions. No palpable adenopathy. 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 Await the results of the new CT scan. Hearing loss. Recommend hearing aid evaluation. New skin lesion right external ear. We will schedule a time for removal of this here in the office.   Other notable issues, if any: Denies any lingering fatigue and reports he's sleeping well at night. Denies any noticeable swelling under his chin or down his neck. Current concern is the frequent appointments/imaging to try and discover the cause of his progressing neuropathy. Also reports he's had 2 new SCC lesions removed by his dermatologist, and is planning to have a cataracts removed form his left eye (possibly this April)   ALLERGIES:  has No Known Allergies.  Meds: Current Outpatient Medications  Medication Sig Dispense Refill   acetaminophen (TYLENOL) 500 MG tablet Take 1,000 mg by mouth every 8 (eight) hours as needed for moderate pain.     amLODipine (NORVASC) 10 MG tablet TAKE 1 TABLET BY MOUTH  DAILY 90 tablet 3  aspirin EC 81 MG tablet Take 1 tablet (81 mg total) by mouth daily. Swallow whole. 30 tablet 11   atorvastatin (LIPITOR) 40 MG tablet TAKE 1 TABLET BY MOUTH  DAILY 90 tablet 3   Cholecalciferol (VITAMIN D PO) Take 5,000 Units by mouth daily.     gabapentin (NEURONTIN) 300 MG capsule Take 2 capsules (600 mg total) by mouth 3 (three) times daily. 180 capsule 3   latanoprost (XALATAN) 0.005 % ophthalmic  solution Place 1 drop into both eyes every morning.     LORazepam (ATIVAN) 1 MG tablet Take 1 tablet (1 mg total) by mouth 3 (three) times daily as needed for anxiety. 60 tablet 5   Melatonin 5 MG TABS Take 5 mg by mouth at bedtime.     metFORMIN (GLUCOPHAGE) 500 MG tablet TAKE 1 TABLET BY MOUTH  TWICE DAILY WITH A MEAL 180 tablet 3   Multiple Vitamin (MULTIVITAMIN) tablet Take 1 tablet by mouth daily.     pentoxifylline (TRENTAL) 400 MG CR tablet Take 400mg  daily x 1 week, then 400mg  BID; take with food 60 tablet 5   ramipril (ALTACE) 10 MG capsule TAKE 1 CAPSULE BY MOUTH  TWICE DAILY 180 capsule 3   timolol (BETIMOL) 0.5 % ophthalmic solution Place 1 drop into both eyes daily.     vitamin E 180 MG (400 UNITS) capsule Take 400 IU daily x 1 week, then 400 IU BID 60 capsule 5   No current facility-administered medications for this encounter.    Physical Findings: The patient is in no acute distress. Patient is alert and oriented. Wt Readings from Last 3 Encounters:  07/20/21 141 lb (64 kg)  07/11/21 140 lb (63.5 kg)  05/28/21 141 lb (64 kg)    height is 5\' 10"  (1.778 m) and weight is 141 lb (64 kg). His temporal temperature is 97 F (36.1 C) (abnormal). His blood pressure is 143/80 (abnormal) and his pulse is 77. His respiration is 18 and oxygen saturation is 100%. .  General: Alert and oriented, in no acute distress HEENT: Head is normocephalic. Left facial droop improving. Neck: Neck is notable for no palpable masses Skin: Skin in treatment fields shows satisfactory healing  Psychiatric: Judgment and insight are intact. Affect is appropriate. Heart RRR Chest CTAB    Lab Findings: Lab Results  Component Value Date   WBC 5.9 01/08/2021   HGB 14.0 01/08/2021   HCT 41.3 01/08/2021   MCV 89.4 01/08/2021   PLT 248 01/08/2021    Lab Results  Component Value Date   TSH 1.06 01/08/2021    Radiographic Findings: CT Soft Tissue Neck W Contrast  Result Date: 07/17/2021 CLINICAL  DATA:  Head/neck cancer, monitor; parotid squamous cell post surgery and radiation EXAM: CT NECK WITH CONTRAST TECHNIQUE: Multidetector CT imaging of the neck was performed using the standard protocol following the bolus administration of intravenous contrast. RADIATION DOSE REDUCTION: This exam was performed according to the departmental dose-optimization program which includes automated exposure control, adjustment of the mA and/or kV according to patient size and/or use of iterative reconstruction technique. CONTRAST:  32mL OMNIPAQUE IOHEXOL 300 MG/ML  SOLN COMPARISON:  03/19/2021 FINDINGS: Pharynx and larynx: Unremarkable.  No mass. Salivary glands: Post significant resection of the left parotid. Right parotid and submandibular glands are unremarkable. Thyroid: Inferior left thyroid lobe nodule is not as well seen. Lymph nodes: No new or enlarged nodes. Vascular: Major neck vessels are patent. Similar calcification at the common carotid bifurcations. Limited intracranial: No abnormal  enhancement. Visualized orbits: Minimally included. Mastoids and visualized paranasal sinuses: No significant opacification. Skeleton: Similar appearance of cervical spine degenerative changes. Upper chest: Dictated separately. Other: None. IMPRESSION: No evidence of recurrent disease. Electronically Signed   By: Macy Mis M.D.   On: 07/17/2021 13:38   CT Chest W Contrast  Result Date: 07/17/2021 CLINICAL DATA:  Parotid cancer EXAM: CT CHEST WITH CONTRAST TECHNIQUE: Multidetector CT imaging of the chest was performed during intravenous contrast administration. RADIATION DOSE REDUCTION: This exam was performed according to the departmental dose-optimization program which includes automated exposure control, adjustment of the mA and/or kV according to patient size and/or use of iterative reconstruction technique. CONTRAST:  15mL OMNIPAQUE IOHEXOL 300 MG/ML  SOLN COMPARISON:  03/19/2021 FINDINGS: Cardiovascular: Aortic  atherosclerosis. Normal heart size. Three-vessel coronary artery calcifications. No pericardial effusion. Mediastinum/Nodes: No enlarged mediastinal, hilar, or axillary lymph nodes. Thyroid gland, trachea, and esophagus demonstrate no significant findings. Lungs/Pleura: There is a new 0.9 cm nodule of the posterior left upper lobe (series 5, image 69). New subpleural cavitary nodule of the peripheral left upper lobe measuring 0.7 cm (series 5, image 32). New 0.4 cm nodule of the superomedial segment right middle lobe (series 5, image 75). Enlarged nodule of the central right middle lobe, measuring 0.6 cm, previously no greater than 0.2 cm (series 5, image 94). New irregular 0.4 cm nodule of the inferior right upper lobe (series 5, image 68). Unchanged 0.4 cm nodule of the lateral segment right middle lobe (series 5, image 84). Bandlike scarring of the bilateral lung bases. No pleural effusion or pneumothorax. Upper Abdomen: No acute abnormality. Musculoskeletal: No chest wall abnormality. No suspicious osseous lesions identified. IMPRESSION: 1. Multiple new and enlarged bilateral pulmonary nodules, measuring 0.9 cm and smaller, consistent with pulmonary metastatic disease. 2. Coronary artery disease. Aortic Atherosclerosis (ICD10-I70.0). Electronically Signed   By: Delanna Ahmadi M.D.   On: 07/17/2021 13:51   NCV with EMG(electromyography)  Result Date: 07/12/2021 Alda Berthold, DO     07/12/2021  4:09 PM [facility] [place], [place]  [place], [place] [address] Tel: [phone] Fax:  [phone] Test Date:  07/12/2021 Patient: Shayne Diguglielmo DOB: 02-26-1941 Physician: Narda Amber, DO Sex: Male Height: 5\' 10"  Ref Phys: Narda Amber, DO ID#: [id number]   Technician:  Patient Complaints: This is a 81 year old man referred for evaluation of neuropathy. NCV & EMG Findings: Electrodiagnostic testing of the right upper and lower extremity shows: Right median and ulnar sensory responses show  prolonged latency (R3.6, R5.4 ms) and normal amplitude.  Right radial sensory response is within normal limits.  Right sural and superficial peroneal sensory responses are absent. Right median and ulnar motor responses show prolonged latency (R4.3, R4.3 ms).  Right peroneal (EDB) and tibial motor responses are absent.  Right peroneal motor response at the tibialis anterior is within normal limits. Right tibial H reflex study is absent.  Right ulnar F wave shows prolonged latency. Chronic motor axonal loss changes are seen affecting the muscles below the knee, without accompanying active denervation.  The remaining tested muscles including the distal hand muscles are within normal limits. 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 ___________________________ Narda Amber, DO Nerve Conduction Studies Anti Sensory Summary Table  Stim Site NR Peak (ms) Norm Peak (ms) P-T Amp (V) Norm P-T Amp Right Median Anti Sensory (2nd Digit)  32C Wrist    3.9 <3.8 10.5 >10 Right Radial  Anti Sensory (Base 1st Digit)  32C Wrist    2.4 <2.8 17.9 >10 Right Sup Peroneal Anti Sensory (Ant Lat Mall)  32C 12 cm NR  <4.6  >3 Right Sural Anti Sensory (Lat Mall)  32C Calf NR  <4.6  >3 Right Ulnar Anti Sensory (5th Digit)  32C Wrist    5.4 <3.2 10.0 >5 Motor Summary Table  Stim Site NR Onset (ms) Norm Onset (ms) O-P Amp (mV) Norm O-P Amp Site1 Site2 Delta-0 (ms) Dist (cm) Vel (m/s) Norm Vel (m/s) Right Median Motor (Abd Poll Brev)  32C Wrist    4.3 <4.0 8.1 >5 Elbow Wrist 5.5 31.0 56 >50 Elbow    9.8  7.6        Right Peroneal Motor (Ext Dig Brev)  32C Ankle NR  <6.0  >2.5 B Fib Ankle  0.0  >40 B Fib NR     Poplt B Fib  0.0  >40 Poplt NR           Right Peroneal TA Motor (Tib Ant)  32C Fib Head    4.5 <4.5 3.1 >3 Poplit Fib Head 2.0 8.0 40 >40 Poplit    6.5  2.8        Right Tibial Motor (Abd Hall Brev)  32C Ankle NR   <6.0  >4 Knee Ankle  0.0  >40 Knee NR           Right Ulnar Motor (Abd Dig Minimi)  32C Wrist    4.3 <3.1 9.8 >7 B Elbow Wrist 5.2 26.0 50 >50 B Elbow    9.5  7.3  A Elbow B Elbow 2.0 10.0 50 >50 A Elbow    11.5  6.5        F Wave Studies  NR F-Lat (ms) Lat Norm (ms) L-R F-Lat (ms) Right Ulnar (Mrkrs) (Abd Dig Min)  32C    35.17 <33  H Reflex Studies  NR H-Lat (ms) Lat Norm (ms) L-R H-Lat (ms) Right Tibial (Gastroc)  32C NR  <35  EMG  Side Muscle Ins Act Fibs Psw Fasc Number Recrt Dur Dur. Amp Amp. Poly Poly. Comment Right AntTibialis Nml Nml Nml Nml 2- Rapid Many 1+ Many 1+ Many 1+ N/A Right Gastroc Nml Nml Nml Nml 2- Rapid Many 1+ Many 1+ Many 1+ N/A Right Flex Dig Long Nml Nml Nml Nml 2- Rapid Many 1+ Many 1+ Many 1+ N/A Right RectFemoris Nml Nml Nml Nml Nml Nml Nml Nml Nml Nml Nml Nml N/A Right GluteusMed Nml Nml Nml Nml Nml Nml Nml Nml Nml Nml Nml Nml N/A Right BicepsFemS Nml Nml Nml Nml Nml Nml Nml Nml Nml Nml Nml Nml N/A Right 1stDorInt Nml Nml Nml Nml Nml Nml Nml Nml Nml Nml Nml Nml N/A Right Abd Poll Brev Nml Nml Nml Nml Nml Nml Nml Nml Nml Nml Nml Nml N/A Right Ext Indicis Nml Nml Nml Nml Nml Nml Nml Nml Nml Nml Nml Nml N/A Right PronatorTeres Nml Nml Nml Nml Nml Nml Nml Nml Nml Nml Nml Nml N/A Right Biceps Nml Nml Nml Nml Nml Nml Nml Nml Nml Nml Nml Nml N/A Right Triceps Nml Nml Nml Nml Nml Nml Nml Nml Nml Nml Nml Nml N/A Right Deltoid Nml Nml Nml Nml Nml Nml Nml Nml Nml Nml Nml Nml N/A Waveforms:               NCV with EMG(electromyography)  Result Date: 07/05/2021 Alda Berthold, DO     07/05/2021  3:41  PM [facility] [place], [place]  [place], [place] [address] Tel: [phone] Fax:  [phone] Test Date:  07/05/2021 Patient: Kanav Kazmierczak DOB: 1940-11-30 Physician: Narda Amber, DO Sex: Male Height: 5\' 9"  Ref Phys: Alysia Penna, M.D. ID#: [id number]   Technician:  Patient Complaints: This is a 81 year old man with history of squamous cell cancer of the parotid gland s/p  radiation to the head and neck referred for evaluation of left hand paresthesias. NCV & EMG Findings: Extensive electrodiagnostic testing of the left upper extremity shows: Left median and ulnar sensory responses are absent.  Left radial sensory response shows reduced amplitude (9.7 V).  Left median motor response shows prolonged latency (4.5 ms) and decreased conduction velocity (Elbow-Wrist, 46 m/s).  Left ulnar motor response shows prolonged latency (5.8 ms), reduced amplitude (5.1 mV), decreased conduction velocity (B Elbow-Wrist, 47 m/s), and decreased conduction velocity (A Elbow-B Elbow, 37 m/s).  Chronic motor axonal loss changes are seen affecting the distal hand muscles, without accompanied active denervation.  Impression: The electrophysiologic findings are consistent with a chronic sensorimotor polyneuropathy, with demyelinating and axonal features, affecting the left upper extremity.  Overall, these findings are moderate-to-severe in degree electrically. ___________________________ Narda Amber, DO Nerve Conduction Studies Anti Sensory Summary Table  Stim Site NR Peak (ms) Norm Peak (ms) P-T Amp (V) Norm P-T Amp Left Median Anti Sensory (2nd Digit)  32C Wrist NR  <3.8  >10 Left Radial Anti Sensory (Base 1st Digit)  32C Wrist    2.7 <2.8 9.7 >10 Left Ulnar Anti Sensory (5th Digit)  32C Wrist NR  <3.2  >5 Motor Summary Table  Stim Site NR Onset (ms) Norm Onset (ms) O-P Amp (mV) Norm O-P Amp Site1 Site2 Delta-0 (ms) Dist (cm) Vel (m/s) Norm Vel (m/s) Left Median Motor (Abd Poll Brev)  32C Wrist    4.5 <4.0 9.2 >5 Elbow Wrist 6.8 31.0 46 >50 Elbow    11.3  8.3        Left Ulnar Motor (Abd Dig Minimi)  32C Wrist    5.8 <3.1 5.1 >7 B Elbow Wrist 5.3 25.0 47 >50 B Elbow    11.1  3.0  A Elbow B Elbow 2.7 10.0 37 >50 A Elbow    13.8  2.6        EMG  Side Muscle Ins Act Fibs Psw Fasc Number Recrt Dur Dur. Amp Amp. Poly Poly. Comment Left 1stDorInt Nml Nml Nml Nml 1- Rapid Few 1+ Few 1+ Few 1+ N/A Left Abd  Poll Brev Nml Nml Nml Nml 1- Rapid Few 1+ Few 1+ Few 1+ N/A Left Ext Indicis Nml Nml Nml Nml 1- Rapid Few 1+ Few 1+ Few 1+ N/A Left PronatorTeres Nml Nml Nml Nml Nml Nml Nml Nml Nml Nml Nml Nml N/A Left Biceps Nml Nml Nml Nml Nml Nml Nml Nml Nml Nml Nml Nml N/A Left Triceps Nml Nml Nml Nml Nml Nml Nml Nml Nml Nml Nml Nml N/A Left Deltoid Nml Nml Nml Nml Nml Nml Nml Nml Nml Nml Nml Nml N/A Left Infraspinatus Nml Nml Nml Nml Nml Nml Nml Nml Nml Nml Nml Nml N/A Waveforms:         Impression/Plan:    1) Head and Neck Cancer Status:   This is a VERY nice man with metastatic squamous cell skin cancer to parotid, s/p post op RT to neck/parotid bed. On surveillance chest CT, has developed nodules highly suspicious for metastatic disease.  I reviewed his CT neck/  chest personally and with the patient and his wife. I recommend a consultation with Dr Alen Blew of med/onc; patient and his wife are eager to hear what he recommends re: systemic therapy.  We can certainly consider SBRT in the future if any nodules are refractory.  He is also curious about PET scan - I defer to Dr. Alen Blew on that.  This new is very upsetting to him and his wife but they are resilient and hopeful. Emotional support given.    2) Nutritional Status:  Wt Readings from Last 3 Encounters:  07/20/21 141 lb (64 kg)  07/11/21 140 lb (63.5 kg)  05/28/21 141 lb (64 kg)   Patient is stabilizing weight PEG tube: none  3)  Swallowing: functional, no issues  5) Dental: Encouraged to continue regular followup with dentistry, and dental hygiene including fluoride rinses.   6) Thyroid function:  check annually Lab Results  Component Value Date   TSH 1.06 01/08/2021    7) LEFT Finger numbness unlikely to be due to RT given timeline of symptoms and pattern of distribution of numbness.  May be related to DM.  Continue trial of Vit E and pentoxyfylline in case there is neuropathy from surgery/RT - MRI C Spine pending for 1 mo from now. I asked  our CNS navigator to move that appt up to sooner to r/o metastatic disease to spine.  8) Follow-up with me in 56mo.  The patient was encouraged to call with any issues or questions before then. I have personally contacted Dr. Alen Blew about his case (see #1).  On date of service, in total, I spent 45 minutes on this encounter. Patient was seen in person. _____________________________________   Eppie Gibson, MD

## 2021-07-24 ENCOUNTER — Other Ambulatory Visit: Payer: Self-pay

## 2021-07-24 ENCOUNTER — Telehealth: Payer: Self-pay | Admitting: Radiation Therapy

## 2021-07-24 DIAGNOSIS — C7989 Secondary malignant neoplasm of other specified sites: Secondary | ICD-10-CM

## 2021-07-24 DIAGNOSIS — R918 Other nonspecific abnormal finding of lung field: Secondary | ICD-10-CM

## 2021-07-24 NOTE — Telephone Encounter (Signed)
Dr. Isidore Moos requested that [name] cervical spine MRI be moved up to next available instead of March. Per her request, this scan has been rescheduled to [facility] 2/8 @ 3:40 and Shaun Mcmillan is aware of the change.   Mont Dutton R.T.(R)(T) Radiation Special Procedures Navigator

## 2021-07-25 ENCOUNTER — Ambulatory Visit
Admission: RE | Admit: 2021-07-25 | Discharge: 2021-07-25 | Disposition: A | Discharge status: Home / Self Care | Payer: Medicare Other | Source: Ambulatory Visit | Attending: Neurology | Admitting: Neurology

## 2021-07-25 ENCOUNTER — Other Ambulatory Visit: Payer: Self-pay

## 2021-07-25 DIAGNOSIS — M47812 Spondylosis without myelopathy or radiculopathy, cervical region: Secondary | ICD-10-CM | POA: Diagnosis not present

## 2021-07-25 DIAGNOSIS — G629 Polyneuropathy, unspecified: Secondary | ICD-10-CM

## 2021-07-25 DIAGNOSIS — G610 Guillain-Barre syndrome: Secondary | ICD-10-CM

## 2021-07-25 DIAGNOSIS — C07 Malignant neoplasm of parotid gland: Secondary | ICD-10-CM

## 2021-07-25 DIAGNOSIS — R292 Abnormal reflex: Secondary | ICD-10-CM

## 2021-07-25 DIAGNOSIS — M4802 Spinal stenosis, cervical region: Secondary | ICD-10-CM | POA: Diagnosis not present

## 2021-07-25 DIAGNOSIS — M50221 Other cervical disc displacement at C4-C5 level: Secondary | ICD-10-CM | POA: Diagnosis not present

## 2021-07-25 MED ORDER — GADOBENATE DIMEGLUMINE 529 MG/ML IV SOLN
13.0000 mL | Freq: Once | INTRAVENOUS | Status: AC | PRN
  Administered 2021-07-25: 13 mL via INTRAVENOUS

## 2021-07-26 ENCOUNTER — Encounter: Payer: Self-pay | Admitting: Licensed Clinical Social Worker

## 2021-07-26 ENCOUNTER — Telehealth: Payer: Self-pay | Admitting: Neurology

## 2021-07-26 NOTE — Telephone Encounter (Signed)
I called and informed patient that his MRI cervical spine does not show any nerve root enhancement/impingement or evidence of metastasis.  There is arthritic changes of the spine, but none which would explain his symptoms.  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    I offered follow-up with dedicated imaging of the thoracic spine, but he tells me that he recently found that he has lung metastasis and will be seeing oncology next week.  He does not wish to proceed with any additional neurological testing at this time.  If and when patient is ready to further investigate his neuropathy, testing would include CSF testing and/or paraneoplastic antibody testing.    Georgeana Oertel K. Posey Pronto, DO

## 2021-07-26 NOTE — Progress Notes (Signed)
[place] Clinical Social Work  Initial Assessment   Shaun Mcmillan is a 81 y.o. year old male contacted by phone. Clinical Social Work was referred by Dr. Isidore Moos for assessment of psychosocial needs.   SDOH (Social Determinants of Health) assessments performed: Yes   Distress Screen completed: No ONCBCN DISTRESS SCREENING 10/02/2020  Screening Type Initial Screening  Distress experienced in past week (1-10) 3  Emotional problem type Adjusting to illness;Adjusting to appearance changes  Information Concerns Type Lack of info about treatment  Physician notified of physical symptoms Yes  Referral to clinical psychology No  Referral to clinical social work Yes  Referral to dietition Yes  Referral to financial advocate Yes  Referral to support programs Yes  Referral to palliative care No      Family/Social Information:  Housing Arrangement: patient lives with spouse  Rabon, Scholle (Spouse) [phone] (Home Phone) Family members/support persons in your life? Family and Friends/Colleagues Transportation concerns: no  Employment: Retired. Income source: Paediatric nurse concerns: No Type of concern: None Food access concerns: no Religious or spiritual practice: yes Services Currently in place:  N/A  Coping/ Adjustment to diagnosis: Patient understands treatment plan and what happens next? yes Concerns about diagnosis and/or treatment: I'm not especially worried about anything Patient reported stressors:  N/A Hopes and priorities: Patient stated he is feeling well and hoping the treatment will work. Patient enjoys time with family/ friends Current coping skills/ strengths: Ability for insight , Active sense of humor , Average or above average intelligence , Capable of independent living , Communication skills , Scientist, research (life sciences) , Motivation for treatment/growth , Physical Health , and Supportive family/friends     SUMMARY: Current SDOH Barriers:   N/A  Clinical Social Work Clinical Goal(s):  Patient stated he is feeling well and does not have any needs, concerns or questions.    Interventions: Discussed common feeling and emotions when being diagnosed with cancer, and the importance of support during treatment Informed patient of the support team roles and support services at [facility] Provided CSW contact information and encouraged patient to call with any questions or concerns CSW informed patient on different resources available to him, include role of CSW in patient care.   Follow Up Plan: Patient will contact CSW with any support or resource needs Patient verbalizes understanding of plan: Yes    Livianna Petraglia , LCSW

## 2021-07-27 ENCOUNTER — Encounter: Payer: Self-pay | Admitting: General Practice

## 2021-07-27 ENCOUNTER — Ambulatory Visit: Payer: Medicare Other | Admitting: Oncology

## 2021-07-27 ENCOUNTER — Ambulatory Visit: Payer: Medicare Other | Admitting: Radiation Oncology

## 2021-07-27 NOTE — Progress Notes (Signed)
[facility] Spiritual Care Note  Left voicemail regarding Spiritual Care as part of Patient and Family Support per referral from [facility], encouraging return call.    [place], [place], [facility] Pager [phone] Voicemail [phone]

## 2021-07-31 ENCOUNTER — Inpatient Hospital Stay: Payer: Medicare Other | Attending: Oncology | Admitting: Oncology

## 2021-07-31 ENCOUNTER — Other Ambulatory Visit: Payer: Self-pay

## 2021-07-31 VITALS — BP 136/73 | HR 85 | Temp 97.9°F | Resp 17 | Ht 70.0 in | Wt 142.4 lb

## 2021-07-31 DIAGNOSIS — Z79899 Other long term (current) drug therapy: Secondary | ICD-10-CM | POA: Insufficient documentation

## 2021-07-31 DIAGNOSIS — C7800 Secondary malignant neoplasm of unspecified lung: Secondary | ICD-10-CM | POA: Diagnosis not present

## 2021-07-31 DIAGNOSIS — C44329 Squamous cell carcinoma of skin of other parts of face: Secondary | ICD-10-CM | POA: Insufficient documentation

## 2021-07-31 DIAGNOSIS — Z5112 Encounter for antineoplastic immunotherapy: Secondary | ICD-10-CM | POA: Insufficient documentation

## 2021-07-31 DIAGNOSIS — C7989 Secondary malignant neoplasm of other specified sites: Secondary | ICD-10-CM

## 2021-07-31 NOTE — Progress Notes (Signed)
Reason for the request:    Pulmonary nodules  HPI: I was asked by Dr. Isidore Moos to evaluate Mr. Fitz for the evaluation of squamous cell carcinoma.  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  He initially had presented with a squamous cell carcinoma of the skin involving the left aspect of his face in March 2022.  He subsequently presented to with neck pain and evaluated by Dr. Constance Holster and found to have a 2.3 cm left parotid mass that was surgically resected on September 20, 2020.  The final pathology showed squamous cell carcinoma measuring 3.8 cm involving the left parotid gland.  He recovered well from surgery and subsequently received radiation therapy under the care of Dr. Isidore Moos that was completed in July 2022.  He received a total of 66 Gray in 33 fractions between Nov 01, 2020 to July 5.  CT scan obtained on July 16, 2020 showed multiple enlarged bilateral pulmonary nodules with the largest measuring 0.9 of the posterior left upper lobe.  There are subpleural cavitary nodule in the peripheral left upper lobe measuring 0.7 cm.  0.4 cm nodule of the superomedial segment of the right middle lobe was also noted.  An enlarged nodule of the central right middle lobe measuring 0.6 cm previously was no greater than 0.2.  There is a new irregular 0.4 cm nodule in the inferior right upper lobe and unchanged 0.4 cm of the lateral segment of the right middle lobe.  MRI on July 25, 2021 of the cervical spine showed no evidence of metastatic disease.  He has also no evidence of metastatic disease in the soft tissue neck.  Clinically, he reports feeling well at this time without any major complaints.  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  He does not report any headaches, blurry vision, syncope or seizures. Does not report any  fevers, chills or sweats.  Does not report any cough, wheezing or hemoptysis.  Does not report any chest pain, palpitation, orthopnea or leg edema.  Does not report any nausea, vomiting or abdominal pain.  Does not report any constipation or diarrhea.  Does not report any skeletal complaints.    Does not report frequency, urgency or hematuria.  Does not report any skin rashes or lesions. Does not report any heat or cold intolerance.  Does not report any lymphadenopathy or petechiae.  Does not report any anxiety or depression.  Remaining review of systems is negative.     Past Medical History:  Diagnosis Date   Arthritis    Diabetes mellitus type II    Glaucoma    sees Dr. 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 Watt Climes, benign polyps, repeat in 5 yrs   left craniotomy  11/03/08   per Dr. Sherley Bounds for a subdrual hematoma   PAROTIDECTOMY Left 09/20/2020   Procedure: PAROTIDECTOMY with resection of skin and rotational flap;  Surgeon: Izora Gala, MD;  Location: [facility];  Service: ENT;  Laterality: Left;   surgery for ocmpound fracture     right leg  :   Current Outpatient Medications:    acetaminophen (TYLENOL) 500 MG tablet, Take 1,000 mg by mouth every 8 (eight) hours as needed for moderate pain., Disp: , Rfl:    amLODipine (NORVASC) 10 MG tablet, TAKE 1 TABLET BY MOUTH  DAILY, Disp: 90 tablet, Rfl: 3   aspirin EC 81 MG tablet, Take 1 tablet (81 mg total) by mouth daily. Swallow whole., Disp: 30 tablet, Rfl: 11   atorvastatin (LIPITOR) 40 MG tablet, TAKE 1 TABLET BY MOUTH  DAILY, Disp: 90 tablet, Rfl: 3   Cholecalciferol (VITAMIN D PO), Take 5,000 Units by mouth daily., Disp: , Rfl:    gabapentin  (NEURONTIN) 300 MG capsule, Take 2 capsules (600 mg total) by mouth 3 (three) times daily., Disp: 180 capsule, Rfl: 3   latanoprost (XALATAN) 0.005 % ophthalmic solution, Place 1 drop into both eyes every morning., Disp: , Rfl:    LORazepam (ATIVAN) 1 MG tablet, Take 1 tablet (1 mg total) by mouth 3 (three) times daily as needed for anxiety., Disp: 60 tablet, Rfl: 5   Melatonin 5 MG TABS, Take 5 mg by mouth at bedtime., Disp: , Rfl:    metFORMIN (GLUCOPHAGE) 500 MG tablet, TAKE 1 TABLET BY MOUTH  TWICE DAILY WITH A MEAL, Disp: 180 tablet, Rfl: 3   Multiple Vitamin (MULTIVITAMIN) tablet, Take 1 tablet by mouth daily., Disp: , Rfl:    pentoxifylline (TRENTAL) 400 MG CR tablet, Take 400mg  daily x 1 week, then 400mg  BID; take with food, Disp: 60 tablet, Rfl: 5   ramipril (ALTACE) 10 MG capsule, TAKE 1 CAPSULE BY MOUTH  TWICE DAILY, Disp: 180 capsule, Rfl: 3   timolol (BETIMOL) 0.5 % ophthalmic solution, Place 1 drop into both eyes daily., Disp: , Rfl:    vitamin E 180 MG (400 UNITS) capsule, Take 400 IU daily x 1 week, then 400 IU BID, Disp: 60 capsule, Rfl: 5:  No Known Allergies:   Family History  Problem Relation Age of Onset   Cancer Mother    Heart disease Other   :   Social History   Socioeconomic History   Marital status: Married    Spouse name: Jana Half   Number of children: 3   Years of education: Not on file   Highest education level: Bachelor's degree (e.g., BA, AB, BS)  Occupational History   Not on file  Tobacco Use   Smoking status: Never   Smokeless tobacco: Never  Vaping Use   Vaping Use: Never used  Substance and Sexual Activity   Alcohol use: Yes    Alcohol/week: 1.0 standard drink    Types: 1 Standard drinks or equivalent per week   Drug use: Not on file   Sexual activity: Not on file  Other Topics Concern   Not on file  Social History Narrative   Right Handed    Lives in a one story home   Has large support system   Independent with ADLs   Social  Determinants of Health   Financial Resource Strain: Low Risk    Difficulty of Paying Living Expenses: Not hard at all  Food Insecurity: No Food Insecurity   Worried About Charity fundraiser in the Last Year: Never true   [place] in the Last Year: Never true  Transportation Needs: No Transportation Needs   Lack of Transportation (Medical): No   Lack of  Transportation (Non-Medical): No  Physical Activity: Insufficiently Active   Days of Exercise per Week: 2 days   Minutes of Exercise per Session: 30 min  Stress: No Stress Concern Present   Feeling of Stress : Not at all  Social Connections: Socially Integrated   Frequency of Communication with Friends and Family: Twice a week   Frequency of Social Gatherings with Friends and Family: Twice a week   Attends Religious Services: More than 4 times per year   Active Member of [employer] or Organizations: Yes   Attends Music therapist: More than 4 times per year   Marital Status: Married  Human resources officer Violence: Not on file  :  Pertinent items are noted in HPI.  Exam: Blood pressure 136/73, pulse 85, temperature 97.9 F (36.6 C), temperature source Temporal, resp. rate 17, height 5\' 10"  (1.778 m), weight 142 lb 6.4 oz (64.6 kg), SpO2 98 %.  General appearance: alert and cooperative appeared without distress. Head: atraumatic without any abnormalities. Eyes: conjunctivae/corneas clear. PERRL.  Sclera anicteric. Throat: lips, mucosa, and tongue normal; without oral thrush or ulcers. Resp: clear to auscultation bilaterally without rhonchi, wheezes or dullness to percussion. Cardio: regular rate and rhythm, S1, S2 normal, no murmur, click, rub or gallop GI: soft, non-tender; bowel sounds normal; no masses,  no organomegaly Skin: Well-healed scar noted on the left cheek.  No nodularity or erythema.  Erythema noted on the right side aspect of his neck. Lymph nodes: Cervical, supraclavicular, and axillary nodes  normal. Neurologic: Grossly normal without any motor, sensory or deep tendon reflexes. Musculoskeletal: No joint deformity or effusion.    CT Soft Tissue Neck W Contrast  Result Date: 07/17/2021 CLINICAL DATA:  Head/neck cancer, monitor; parotid squamous cell post surgery and radiation EXAM: CT NECK WITH CONTRAST TECHNIQUE: Multidetector CT imaging of the neck was performed using the standard protocol following the bolus administration of intravenous contrast. RADIATION DOSE REDUCTION: This exam was performed according to the departmental dose-optimization program which includes automated exposure control, adjustment of the mA and/or kV according to patient size and/or use of iterative reconstruction technique. CONTRAST:  63mL OMNIPAQUE IOHEXOL 300 MG/ML  SOLN COMPARISON:  03/19/2021 FINDINGS: Pharynx and larynx: Unremarkable.  No mass. Salivary glands: Post significant resection of the left parotid. Right parotid and submandibular glands are unremarkable. Thyroid: Inferior left thyroid lobe nodule is not as well seen. Lymph nodes: No new or enlarged nodes. Vascular: Major neck vessels are patent. Similar calcification at the common carotid bifurcations. Limited intracranial: No abnormal enhancement. Visualized orbits: Minimally included. Mastoids and visualized paranasal sinuses: No significant opacification. Skeleton: Similar appearance of cervical spine degenerative changes. Upper chest: Dictated separately. Other: None. IMPRESSION: No evidence of recurrent disease. Electronically Signed   By: Macy Mis M.D.   On: 07/17/2021 13:38   CT Chest W Contrast  Result Date: 07/17/2021 CLINICAL DATA:  Parotid cancer EXAM: CT CHEST WITH CONTRAST TECHNIQUE: Multidetector CT imaging of the chest was performed during intravenous contrast administration. RADIATION DOSE REDUCTION: This exam was performed according to the departmental dose-optimization program which includes automated exposure control,  adjustment of the mA and/or kV according to patient size and/or use of iterative reconstruction technique. CONTRAST:  59mL OMNIPAQUE IOHEXOL 300 MG/ML  SOLN COMPARISON:  03/19/2021 FINDINGS: Cardiovascular: Aortic atherosclerosis. Normal heart size. Three-vessel coronary artery calcifications. No pericardial effusion. Mediastinum/Nodes: No enlarged mediastinal, hilar, or axillary lymph nodes. Thyroid gland, trachea, and esophagus demonstrate no significant findings. Lungs/Pleura: There is a new 0.9 cm nodule  of the posterior left upper lobe (series 5, image 69). New subpleural cavitary nodule of the peripheral left upper lobe measuring 0.7 cm (series 5, image 32). New 0.4 cm nodule of the superomedial segment right middle lobe (series 5, image 75). Enlarged nodule of the central right middle lobe, measuring 0.6 cm, previously no greater than 0.2 cm (series 5, image 94). New irregular 0.4 cm nodule of the inferior right upper lobe (series 5, image 68). Unchanged 0.4 cm nodule of the lateral segment right middle lobe (series 5, image 84). Bandlike scarring of the bilateral lung bases. No pleural effusion or pneumothorax. Upper Abdomen: No acute abnormality. Musculoskeletal: No chest wall abnormality. No suspicious osseous lesions identified. IMPRESSION: 1. Multiple new and enlarged bilateral pulmonary nodules, measuring 0.9 cm and smaller, consistent with pulmonary metastatic disease. 2. Coronary artery disease. Aortic Atherosclerosis (ICD10-I70.0). Electronically Signed   By: Delanna Ahmadi M.D.   On: 07/17/2021 13:51   MR CERVICAL SPINE W WO CONTRAST  Result Date: 07/26/2021 CLINICAL DATA:  Hyper reflexia.  History of metastatic disease. EXAM: MRI CERVICAL SPINE WITHOUT AND WITH CONTRAST TECHNIQUE: Multiplanar and multiecho pulse sequences of the cervical spine, to include the craniocervical junction and cervicothoracic junction, were obtained without and with intravenous contrast. CONTRAST:  36mL MULTIHANCE  GADOBENATE DIMEGLUMINE 529 MG/ML IV SOLN COMPARISON:  CT soft tissue neck 07/16/2021 FINDINGS: Alignment: Normal Vertebrae: Diffusely fatty bone marrow compatible with prior radiation. Negative for metastatic disease or fracture Cord: Cord signal normal.  No enhancing lesion in the cord. Fluid collection posterior to the spinal cord at T2 and T3, incompletely evaluated. This fluid is slightly hypointense to CSF on T2 weighted imaging. No abnormal enhancement. Posterior Fossa, vertebral arteries, paraspinal tissues: Negative for soft tissue mass or adenopathy in the neck. Disc levels: C2-3: Mild foraminal narrowing bilaterally. Mild uncinate spurring and mild facet degeneration C3-4: Moderate to severe left foraminal encroachment due to prominent uncinate spurring. Mild right foraminal narrowing. Mild spinal stenosis. C4-5: Small central disc protrusion. Diffuse uncinate spurring. Asymmetric facet degeneration on the left. Moderate foraminal narrowing bilaterally due to spurring C5-6: Mild foraminal narrowing bilaterally due to mild spurring C6-7: Moderate left foraminal narrowing and mild right foraminal narrowing due to spurring. C7-T1: Negative for stenosis. IMPRESSION: 1. 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 Bilateral foraminal encroachment as detailed above. 2. Negative for metastatic disease. 3. Cyst posterior to the upper thoracic cord at the T2 and T3 level. The cord appears displaced anteriorly. No abnormal enhancement. 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 Recommend follow-up MRI thoracic spine without with contrast for further evaluation Electronically Signed   By: Franchot Gallo M.D.   On: 07/26/2021 11:30    Assessment and Plan:   81 year old man with:  1.  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  He initially presented with parotid gland involvement in April 2022 and received surgical resection and adjuvant radiation  therapy.  The natural course of this disease was reviewed at this time and treatment choices were discussed.  It would be reasonable to staging with a PET scan to complete his staging.  This will not affect the treatment recommendation at this time and the need for systemic therapy.  Given his advanced disease and pulmonary nodules which is likely related to metastatic squamous cell carcinoma, he needs systemic therapy.  Complication associated with immunotherapy specifically Libtayo were reviewed.  Complications include nausea, fatigue, GI toxicity as well as autoimmune mediated complications.  These include dermatitis, pneumonitis, colitis, hepatitis and thyroid disease.  The benefit of this treatment is to control his disease, shrink his pulmonary nodules hopefully prolong his overall survival.  I do not believe the treatment is curative however.  Alternative treatment options including traditional chemotherapy as well as veg F monoclonal antibodies among others.  After discussion today he is agreeable to proceed with Libtayo 350 mg every 3 weeks.  He will receive 3-4 cycles and update his staging and subsequently.  2.  IV access: Peripheral veins will being used on Port-A-Cath option will be deferred at this time.  3.  Antiemetics: Compazine will be available to him.  4.  Autoimmune complications: We will continue to monitor these and address with him.  Thyroid function will be monitored.  Adequately.  5.  Follow-up: Will be in near future to start therapy.  60  minutes were dedicated to this visit. The time was spent on reviewing laboratory data, imaging studies, discussing treatment options, reviewing pathology results and answering questions regarding future plan.     A copy of this consult has been forwarded to the requesting physician.

## 2021-07-31 NOTE — Progress Notes (Signed)
START ON PATHWAY REGIMEN - Melanoma and Other Skin Cancers     A cycle is every 21 days:     [id number]   **Always confirm dose/schedule in your pharmacy ordering system**  Patient Characteristics: Cutaneous Squamous Cell Carcinoma, Distant Metastases Disease Classification: Cutaneous Squamous Cell Carcinoma Therapeutic Status: Distant Metastases Click here if multiple primary tumors are present.: false  Intent of Therapy: Non-Curative / Palliative Intent, Discussed with Patient

## 2021-08-03 ENCOUNTER — Telehealth: Payer: Self-pay | Admitting: Oncology

## 2021-08-03 NOTE — Progress Notes (Signed)
Pharmacist Chemotherapy Monitoring - Initial Assessment    Anticipated start date: 08/10/21   The following has been reviewed per standard work regarding the patient's treatment regimen: The patient's diagnosis, treatment plan and drug doses, and organ/hematologic function Lab orders and baseline tests specific to treatment regimen  The treatment plan start date, drug sequencing, and pre-medications Prior authorization status  Patient's documented medication list, including drug-drug interaction screen and prescriptions for anti-emetics and supportive care specific to the treatment regimen The drug concentrations, fluid compatibility, administration routes, and timing of the medications to be used The patient's access for treatment and lifetime cumulative dose history, if applicable  The patient's medication allergies and previous infusion related reactions, if applicable   Changes made to treatment plan:  N/A  Follow up needed:  Pending authorization for treatment    Philomena Course, [place], 08/03/2021  11:23 AM

## 2021-08-03 NOTE — Telephone Encounter (Signed)
Scheduled per 02/14 los, patient has been called and voicemail was left. 

## 2021-08-08 ENCOUNTER — Other Ambulatory Visit: Payer: Self-pay | Admitting: *Deleted

## 2021-08-08 MED ORDER — PROCHLORPERAZINE MALEATE 10 MG PO TABS: 10.0000 mg | ORAL_TABLET | Freq: Four times a day (QID) | ORAL | 0 refills | Status: DC | PRN

## 2021-08-09 ENCOUNTER — Other Ambulatory Visit: Payer: Self-pay

## 2021-08-09 ENCOUNTER — Inpatient Hospital Stay: Payer: Medicare Other

## 2021-08-10 ENCOUNTER — Ambulatory Visit (HOSPITAL_COMMUNITY)
Admission: RE | Admit: 2021-08-10 | Discharge: 2021-08-10 | Disposition: A | Discharge status: Home / Self Care | Payer: Medicare Other | Source: Ambulatory Visit | Attending: Oncology | Admitting: Oncology

## 2021-08-10 ENCOUNTER — Inpatient Hospital Stay: Payer: Medicare Other

## 2021-08-10 ENCOUNTER — Telehealth: Payer: Self-pay | Admitting: Oncology

## 2021-08-10 VITALS — BP 154/81 | HR 84 | Temp 98.2°F | Resp 18 | Wt 144.0 lb

## 2021-08-10 DIAGNOSIS — Z79899 Other long term (current) drug therapy: Secondary | ICD-10-CM | POA: Diagnosis not present

## 2021-08-10 DIAGNOSIS — Z5112 Encounter for antineoplastic immunotherapy: Secondary | ICD-10-CM | POA: Diagnosis not present

## 2021-08-10 DIAGNOSIS — C7989 Secondary malignant neoplasm of other specified sites: Secondary | ICD-10-CM | POA: Diagnosis not present

## 2021-08-10 DIAGNOSIS — C7800 Secondary malignant neoplasm of unspecified lung: Secondary | ICD-10-CM | POA: Diagnosis not present

## 2021-08-10 DIAGNOSIS — C44329 Squamous cell carcinoma of skin of other parts of face: Secondary | ICD-10-CM | POA: Diagnosis not present

## 2021-08-10 DIAGNOSIS — C07 Malignant neoplasm of parotid gland: Secondary | ICD-10-CM | POA: Diagnosis not present

## 2021-08-10 LAB — CBC WITH DIFFERENTIAL (CANCER CENTER ONLY)
Abs Immature Granulocytes: 0.02 10*3/uL (ref 0.00–0.07)
Basophils Absolute: 0 10*3/uL (ref 0.0–0.1)
Basophils Relative: 0 %
Eosinophils Absolute: 0.1 10*3/uL (ref 0.0–0.5)
Eosinophils Relative: 2 %
HCT: 37.3 % — ABNORMAL LOW (ref 39.0–52.0)
Hemoglobin: 12.5 g/dL — ABNORMAL LOW (ref 13.0–17.0)
Immature Granulocytes: 0 %
Lymphocytes Relative: 21 %
Lymphs Abs: 1 10*3/uL (ref 0.7–4.0)
MCH: 30.5 pg (ref 26.0–34.0)
MCHC: 33.5 g/dL (ref 30.0–36.0)
MCV: 91 fL (ref 80.0–100.0)
Monocytes Absolute: 0.3 10*3/uL (ref 0.1–1.0)
Monocytes Relative: 7 %
Neutro Abs: 3.1 10*3/uL (ref 1.7–7.7)
Neutrophils Relative %: 70 %
Platelet Count: 227 10*3/uL (ref 150–400)
RBC: 4.1 MIL/uL — ABNORMAL LOW (ref 4.22–5.81)
RDW: 13.1 % (ref 11.5–15.5)
WBC Count: 4.6 10*3/uL (ref 4.0–10.5)
nRBC: 0 % (ref 0.0–0.2)

## 2021-08-10 LAB — CMP (CANCER CENTER ONLY)
ALT: 13 U/L (ref 0–44)
AST: 15 U/L (ref 15–41)
Albumin: 4 g/dL (ref 3.5–5.0)
Alkaline Phosphatase: 120 U/L (ref 38–126)
Anion gap: 5 (ref 5–15)
BUN: 15 mg/dL (ref 8–23)
CO2: 34 mmol/L — ABNORMAL HIGH (ref 22–32)
Calcium: 9.5 mg/dL (ref 8.9–10.3)
Chloride: 104 mmol/L (ref 98–111)
Creatinine: 0.75 mg/dL (ref 0.61–1.24)
GFR, Estimated: >60 mL/min (ref >60)
Glucose, Bld: 241 mg/dL — ABNORMAL HIGH (ref 70–99)
Potassium: 3.8 mmol/L (ref 3.5–5.1)
Sodium: 143 mmol/L (ref 135–145)
Total Bilirubin: 0.7 mg/dL (ref 0.3–1.2)
Total Protein: 6.5 g/dL (ref 6.5–8.1)

## 2021-08-10 LAB — TSH: TSH: 1.338 u[IU]/mL (ref 0.320–4.118)

## 2021-08-10 LAB — GLUCOSE, CAPILLARY: Glucose-Capillary: 133 mg/dL — ABNORMAL HIGH (ref 70–99)

## 2021-08-10 MED ORDER — FLUDEOXYGLUCOSE F - 18 (FDG) INJECTION
7.0500 | Freq: Once | INTRAVENOUS | Status: AC | PRN
  Administered 2021-08-10: 7.05 via INTRAVENOUS

## 2021-08-10 MED ORDER — SODIUM CHLORIDE 0.9 % IV SOLN
350.0000 mg | Freq: Once | INTRAVENOUS | Status: AC
  Administered 2021-08-10: 350 mg via INTRAVENOUS
  Filled 2021-08-10: qty 7

## 2021-08-10 MED ORDER — SODIUM CHLORIDE 0.9 % IV SOLN: Freq: Once | INTRAVENOUS | Status: AC

## 2021-08-10 NOTE — Telephone Encounter (Signed)
Called patient regarding upcoming March and April appointments, patient has been called and notified. °

## 2021-08-10 NOTE — Patient Instructions (Signed)
[facility]  Discharge Instructions: Thank you for choosing [facility] to provide your oncology and hematology care.   If you have a lab appointment with the [facility], please go directly to the [facility] and check in at the registration area.   Wear comfortable clothing and clothing appropriate for easy access to any Portacath or PICC line.   We strive to give you quality time with your provider. You may need to reschedule your appointment if you arrive late (15 or more minutes).  Arriving late affects you and other patients whose appointments are after yours.  Also, if you miss three or more appointments without notifying the office, you may be dismissed from the clinic at the providers discretion.      For prescription refill requests, have your pharmacy contact our office and allow 72 hours for refills to be completed.    Today you received the following chemotherapy and/or immunotherapy agents Libtayo    To help prevent nausea and vomiting after your treatment, we encourage you to take your nausea medication as directed.  BELOW ARE SYMPTOMS THAT SHOULD BE REPORTED IMMEDIATELY: *FEVER GREATER THAN 100.4 F (38 C) OR HIGHER *CHILLS OR SWEATING *NAUSEA AND VOMITING THAT IS NOT CONTROLLED WITH YOUR NAUSEA MEDICATION *UNUSUAL SHORTNESS OF BREATH *UNUSUAL BRUISING OR BLEEDING *URINARY PROBLEMS (pain or burning when urinating, or frequent urination) *BOWEL PROBLEMS (unusual diarrhea, constipation, pain near the anus) TENDERNESS IN MOUTH AND THROAT WITH OR WITHOUT PRESENCE OF ULCERS (sore throat, sores in mouth, or a toothache) UNUSUAL RASH, SWELLING OR PAIN  UNUSUAL VAGINAL DISCHARGE OR ITCHING   Items with * indicate a potential emergency and should be followed up as soon as possible or go to the Emergency Department if any problems should occur.  Please show the CHEMOTHERAPY ALERT CARD or IMMUNOTHERAPY ALERT CARD at check-in to the  Emergency Department and triage nurse.  Should you have questions after your visit or need to cancel or reschedule your appointment, please contact [facility]  Dept: [phone]  and follow the prompts.  Office hours are 8:00 a.m. to 4:30 p.m. Monday - Friday. Please note that voicemails left after 4:00 p.m. may not be returned until the following business day.  We are closed weekends and major holidays. You have access to a nurse at all times for urgent questions. Please call the main number to the clinic Dept: [phone] and follow the prompts.   For any non-urgent questions, you may also contact your provider using MyChart. We now offer e-Visits for anyone 67 and older to request care online for non-urgent symptoms. For details visit [email or web address].   Also download the MyChart app! Go to the app store, search "MyChart", open the app, select [facility], and log in with your MyChart username and password.  Due to Covid, a mask is required upon entering the hospital/clinic. If you do not have a mask, one will be given to you upon arrival. For doctor visits, patients may have 1 support person aged 55 or older with them. For treatment visits, patients cannot have anyone with them due to current Covid guidelines and our immunocompromised population.   Cemiplimab infusion What is this medication? CEMIPLIMAB (se mip li mab) is a monoclonal antibody. It treats certain types of cancer. Some of the cancers treated are cutaneous squamous cell carcinoma and basal cell carcinoma. This medicine may be used for other purposes; ask your health care provider or pharmacist if  you have questions. COMMON BRAND NAME(S): LIBTAYO What should I tell my care team before I take this medication? They need to know if you have any of these conditions: autoimmune diseases like Crohn's disease, ulcerative colitis, or lupus have had or planning to have an allogeneic stem cell  transplant (uses someone else's stem cells) history of organ transplant nervous system problems like myasthenia gravis or Guillain-Barre syndrome an unusual or allergic reaction to cemiplimab, other drugs, foods, dyes, or preservatives pregnant or trying to get pregnant breast-feeding How should I use this medication? This medicine is for infusion into a vein. It is given by a health care professional in a hospital or clinic setting. A special MedGuide will be given to you before each treatment. Be sure to read this information carefully each time. Talk to your pediatrician regarding the use of this medicine in children. Special care may be needed. Overdosage: If you think you have taken too much of this medicine contact a poison control center or emergency room at once. NOTE: This medicine is only for you. Do not share this medicine with others. What if I miss a dose? It is important not to miss your dose. Call your doctor or health care professional if you are unable to keep an appointment. What may interact with this medication? Interactions have not been studied. This list may not describe all possible interactions. Give your health care provider a list of all the medicines, herbs, non-prescription drugs, or dietary supplements you use. Also tell them if you smoke, drink alcohol, or use illegal drugs. Some items may interact with your medicine. What should I watch for while using this medication? Your condition will be monitored carefully while you are receiving this medicine. You may need blood work done while you are taking this medicine. Do not become pregnant while taking this medicine or for at least 4 months after stopping it. Women should inform their doctor if they wish to become pregnant or think they might be pregnant. There is a potential for serious side effects to an unborn child. Talk to your health care professional or pharmacist for more information. Do not breast-feed an  infant while taking this medicine or for at least 4 months after the last dose. What side effects may I notice from receiving this medication? Side effects that you should report to your doctor or health care professional as soon as possible: allergic reactions like skin rash, itching or hives; swelling of the face, lips, or tongue black, tarry stools bloody or watery diarrhea breathing problems changes in vision changes in voice chest pain or chest tightness chills cough dizziness fast or irregular heart beat feeling faint or lightheaded hair loss increased hunger or thirst muscle weakness persistent headache redness, blistering, peeling or loosening of the skin, including inside the mouth signs and symptoms of kidney injury like trouble passing urine or change in the amount of urine signs and symptoms of liver injury like dark yellow or brown urine; general ill feeling or flu-like symptoms; light-colored stools; loss of appetite; nausea; right upper belly pain; unusually weak or tired; yellowing of the eyes or skin stomach pain unusual bleeding or bruising weight gain or weight loss unusual sweating Side effects that usually do not require medical attention (report these to your doctor or health care professional if they continue or are bothersome): bone pain constipation muscle pain tiredness This list may not describe all possible side effects. Call your doctor for medical advice about side effects. You may  report side effects to FDA at 1-800-FDA-1088. Where should I keep my medication? This drug is given in a hospital or clinic and will not be stored at home. NOTE: This sheet is a summary. It may not cover all possible information. If you have questions about this medicine, talk to your doctor, pharmacist, or health care provider.  2022 Elsevier/Gold Standard (2021-02-20 00:00:00)

## 2021-08-13 ENCOUNTER — Telehealth: Payer: Self-pay

## 2021-08-13 NOTE — Telephone Encounter (Signed)
Called pt to advise of apt with Dr. Alen Blew on 08/20/21 @ 10:30 AM. Scheduling message sent.

## 2021-08-15 ENCOUNTER — Telehealth: Payer: Self-pay

## 2021-08-15 NOTE — Telephone Encounter (Signed)
Son called to ask if Dr. Alen Blew thought parents would benefit from having him at 3/6 apt when recent scan is discussed. ?

## 2021-08-15 NOTE — Telephone Encounter (Signed)
Duplicate

## 2021-08-17 ENCOUNTER — Ambulatory Visit: Payer: Medicare Other | Admitting: Neurology

## 2021-08-20 ENCOUNTER — Other Ambulatory Visit: Payer: Self-pay

## 2021-08-20 ENCOUNTER — Inpatient Hospital Stay: Payer: Medicare Other | Attending: Oncology | Admitting: Oncology

## 2021-08-20 VITALS — BP 140/80 | HR 43 | Temp 97.5°F | Resp 18 | Wt 146.2 lb

## 2021-08-20 DIAGNOSIS — Z5112 Encounter for antineoplastic immunotherapy: Secondary | ICD-10-CM | POA: Insufficient documentation

## 2021-08-20 DIAGNOSIS — C7989 Secondary malignant neoplasm of other specified sites: Secondary | ICD-10-CM

## 2021-08-20 DIAGNOSIS — C7800 Secondary malignant neoplasm of unspecified lung: Secondary | ICD-10-CM | POA: Insufficient documentation

## 2021-08-20 DIAGNOSIS — C44329 Squamous cell carcinoma of skin of other parts of face: Secondary | ICD-10-CM | POA: Insufficient documentation

## 2021-08-20 DIAGNOSIS — Z79899 Other long term (current) drug therapy: Secondary | ICD-10-CM | POA: Insufficient documentation

## 2021-08-20 NOTE — Progress Notes (Signed)
Hematology and Oncology Follow Up Visit ? ?Shaun Mcmillan ?[id number] ?Apr 07, 1941 81 y.o. ?08/20/2021 10:16 AM ?[name], MDFry, [name], MD  ? 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  He was initially diagnosed with localized disease in April 2022. ? ? ?Prior Therapy: ? ?He is status post parotidectomy with skin resection and rotational flap completed by Dr. Constance Holster in April 2022.  Final pathology showed squamous cell carcinoma. ? ?He is status post radiation therapy under the care of Dr. Isidore Moos completing 33 fractions on December 19, 2020 for a total of 66 Pearline Cables ? ? ?Current therapy: Libtayo 350 mg started on August 10, 2021.  ? ?Interim History: Shaun Mcmillan returns today for a follow-up visit.  Since the last visit, he received the first cycle of Libtayo without any major complications.  He denies any nausea, vomiting or abdominal pain.  He denies any hospitalizations or illnesses.  He denies any skin rash or lesions.  He does report dryness occasionally.  His performance status and quality of life remains unchanged. ? ? ? ? ?Medications: I have reviewed the patient's current medications.  ?Current Outpatient Medications  ?Medication Sig Dispense Refill  ? prochlorperazine (COMPAZINE) 10 MG tablet Take 1 tablet (10 mg total) by mouth every 6 (six) hours as needed for nausea or vomiting. 30 tablet 0  ? acetaminophen (TYLENOL) 500 MG tablet Take 1,000 mg by mouth every 8 (eight) hours as needed for moderate pain.    ? amLODipine (NORVASC) 10 MG tablet TAKE 1 TABLET BY MOUTH  DAILY 90 tablet 3  ? aspirin EC 81 MG tablet Take 1 tablet (81 mg total) by mouth daily. Swallow whole. 30 tablet 11  ? atorvastatin (LIPITOR) 40 MG tablet TAKE 1 TABLET BY MOUTH  DAILY 90 tablet 3  ? Cholecalciferol (VITAMIN D PO) Take 5,000 Units by mouth daily.    ? gabapentin (NEURONTIN) 300 MG capsule Take 2 capsules (600 mg total) by  mouth 3 (three) times daily. 180 capsule 3  ? latanoprost (XALATAN) 0.005 % ophthalmic solution Place 1 drop into both eyes every morning.    ? LORazepam (ATIVAN) 1 MG tablet Take 1 tablet (1 mg total) by mouth 3 (three) times daily as needed for anxiety. 60 tablet 5  ? Melatonin 5 MG TABS Take 5 mg by mouth at bedtime.    ? metFORMIN (GLUCOPHAGE) 500 MG tablet TAKE 1 TABLET BY MOUTH  TWICE DAILY WITH A MEAL 180 tablet 3  ? Multiple Vitamin (MULTIVITAMIN) tablet Take 1 tablet by mouth daily.    ? ramipril (ALTACE) 10 MG capsule TAKE 1 CAPSULE BY MOUTH  TWICE DAILY 180 capsule 3  ? timolol (BETIMOL) 0.5 % ophthalmic solution Place 1 drop into both eyes daily.    ? ?No current facility-administered medications for this visit.  ? ? ? ?Allergies: No Known Allergies ? ? ? ?Physical Exam: ?Blood pressure 140/80, pulse (!) 43, temperature (!) 97.5 ?F (36.4 ?C), temperature source Temporal, resp. rate 18, weight 146 lb 3 oz (66.3 kg), SpO2 100 %. ? ?ECOG: 0 ? ? ?General appearance: Comfortable appearing without any discomfort ?Head: Normocephalic without any trauma ?Oropharynx: Mucous membranes are moist and pink without any thrush or ulcers. ?Eyes: Pupils are equal and round reactive to light. ?Lymph nodes: No cervical, supraclavicular, inguinal or axillary lymphadenopathy.   ?Heart:regular rate and rhythm.  S1 and S2 without leg edema. ?Lung: Clear without any rhonchi  or wheezes.  No dullness to percussion. ?Abdomin: Soft, nontender, nondistended with good bowel sounds.  No hepatosplenomegaly. ?Musculoskeletal: No joint deformity or effusion.  Full range of motion noted. ?Neurological: No deficits noted on motor, sensory and deep tendon reflex exam. ?Skin: No petechial rash or dryness.  Appeared moist.  ? ? ? ? ?Lab Results: ?Lab Results  ?Component Value Date  ? WBC 4.6 08/10/2021  ? HGB 12.5 (L) 08/10/2021  ? HCT 37.3 (L) 08/10/2021  ? MCV 91.0 08/10/2021  ? PLT 227 08/10/2021  ? ?  Chemistry   ?   ?Component Value  Date/Time  ? NA 143 08/10/2021 1132  ? K 3.8 08/10/2021 1132  ? CL 104 08/10/2021 1132  ? CO2 34 (H) 08/10/2021 1132  ? BUN 15 08/10/2021 1132  ? CREATININE 0.75 08/10/2021 1132  ? CREATININE 0.69 (L) 01/08/2021 1553  ?    ?Component Value Date/Time  ? CALCIUM 9.5 08/10/2021 1132  ? ALKPHOS 120 08/10/2021 1132  ? AST 15 08/10/2021 1132  ? ALT 13 08/10/2021 1132  ? BILITOT 0.7 08/10/2021 1132  ?  ? ?IMPRESSION: ?1. Hypermetabolic left upper lobe nodules are compatible with ?malignancy. Small nodules in the right upper lobe and right middle ?lobe are suspicious for malignancy, given their low-grade activity ?despite the small size. Given the clinical context, metastatic ?disease is favored. No pathologic adenopathy is observed. ?2. There is also some bandlike density favoring atelectasis or ?scarring in the left lower lobe although with moderately accentuated ?metabolic activity, surveillance suggested. ?3. No findings of recurrence along the parotidectomy bed. ?4. 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 Remote left craniotomy. Substantial systemic ?atherosclerosis including the coronary arteries. Aortic ?Atherosclerosis (ICD10-I70.0). 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 Prominent stool throughout the ?colon favors constipation. Mild sigmoid colon diverticulosis. Mild ?scarring in both lower lobes. ?  ? ? ?Impression and Plan: ? ?81 year old man with: ? ?1.  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 ?  ?PET scan obtained on August 10, 2021 was personally reviewed and discussed with the patient.  He does not have any widespread metastatic disease other than the pulmonary nodules.  The natural course of this disease was updated at this time and treatment choices were discussed.  The mechanism of action and side effects  associated with Libtayo were discussed in detail.  Also the goals of care and overall prognosis was discussed today in detail. ? ?Overall, he is agreeable to continue with his treatments and he understands the treatment is likely palliative at this time.  Disease control hopefully can be achieved with this medication and success rate will be determined by repeat imaging studies which will be scheduled afterwards. ? ? ?2.  IV access: No issues reported with peripheral veins. ?  ?3.  Antiemetics: No nausea reported at this time.  Compazine is available to him. ?  ?4.  Autoimmune complications: These were discussed in details today.  Complications including pneumonitis, colitis and thyroid disease were reiterated.  We will continue to monitor on subsequent visits. ?  ?5.  Follow-up: In the next 2 weeks for cycle 2 of therapy. ?  ?30  minutes were spent on this encounter.  The time was dedicated to reviewing laboratory data, disease status update, imaging studies, discussing treatment choices and complications related to therapy. ? ? ?Zola Button, MD ?3/6/202310:16  AM ? ?

## 2021-08-22 ENCOUNTER — Other Ambulatory Visit: Payer: Medicare Other

## 2021-08-31 ENCOUNTER — Other Ambulatory Visit: Payer: Self-pay

## 2021-08-31 ENCOUNTER — Inpatient Hospital Stay: Payer: Medicare Other

## 2021-08-31 ENCOUNTER — Inpatient Hospital Stay: Payer: Medicare Other | Admitting: Oncology

## 2021-08-31 VITALS — BP 143/77 | HR 69 | Temp 98.1°F | Resp 18 | Ht 70.0 in | Wt 143.2 lb

## 2021-08-31 DIAGNOSIS — C7989 Secondary malignant neoplasm of other specified sites: Secondary | ICD-10-CM | POA: Diagnosis not present

## 2021-08-31 DIAGNOSIS — Z5112 Encounter for antineoplastic immunotherapy: Secondary | ICD-10-CM | POA: Diagnosis not present

## 2021-08-31 DIAGNOSIS — C7800 Secondary malignant neoplasm of unspecified lung: Secondary | ICD-10-CM | POA: Diagnosis not present

## 2021-08-31 DIAGNOSIS — Z79899 Other long term (current) drug therapy: Secondary | ICD-10-CM | POA: Diagnosis not present

## 2021-08-31 DIAGNOSIS — C44329 Squamous cell carcinoma of skin of other parts of face: Secondary | ICD-10-CM | POA: Diagnosis not present

## 2021-08-31 LAB — CMP (CANCER CENTER ONLY)
ALT: 16 U/L (ref 0–44)
AST: 18 U/L (ref 15–41)
Albumin: 4.4 g/dL (ref 3.5–5.0)
Alkaline Phosphatase: 128 U/L — ABNORMAL HIGH (ref 38–126)
Anion gap: 7 (ref 5–15)
BUN: 12 mg/dL (ref 8–23)
CO2: 32 mmol/L (ref 22–32)
Calcium: 9.9 mg/dL (ref 8.9–10.3)
Chloride: 103 mmol/L (ref 98–111)
Creatinine: 0.78 mg/dL (ref 0.61–1.24)
GFR, Estimated: >60 mL/min (ref >60)
Glucose, Bld: 119 mg/dL — ABNORMAL HIGH (ref 70–99)
Potassium: 3.6 mmol/L (ref 3.5–5.1)
Sodium: 142 mmol/L (ref 135–145)
Total Bilirubin: 0.9 mg/dL (ref 0.3–1.2)
Total Protein: 7.5 g/dL (ref 6.5–8.1)

## 2021-08-31 LAB — CBC WITH DIFFERENTIAL (CANCER CENTER ONLY)
Abs Immature Granulocytes: 0.02 10*3/uL (ref 0.00–0.07)
Basophils Absolute: 0 10*3/uL (ref 0.0–0.1)
Basophils Relative: 0 %
Eosinophils Absolute: 0.2 10*3/uL (ref 0.0–0.5)
Eosinophils Relative: 3 %
HCT: 41.5 % (ref 39.0–52.0)
Hemoglobin: 13.8 g/dL (ref 13.0–17.0)
Immature Granulocytes: 0 %
Lymphocytes Relative: 18 %
Lymphs Abs: 1.1 10*3/uL (ref 0.7–4.0)
MCH: 29.6 pg (ref 26.0–34.0)
MCHC: 33.3 g/dL (ref 30.0–36.0)
MCV: 88.9 fL (ref 80.0–100.0)
Monocytes Absolute: 0.8 10*3/uL (ref 0.1–1.0)
Monocytes Relative: 12 %
Neutro Abs: 4.2 10*3/uL (ref 1.7–7.7)
Neutrophils Relative %: 67 %
Platelet Count: 236 10*3/uL (ref 150–400)
RBC: 4.67 MIL/uL (ref 4.22–5.81)
RDW: 12.9 % (ref 11.5–15.5)
WBC Count: 6.3 10*3/uL (ref 4.0–10.5)
nRBC: 0 % (ref 0.0–0.2)

## 2021-08-31 LAB — TSH: TSH: 3.674 u[IU]/mL (ref 0.320–4.118)

## 2021-08-31 MED ORDER — SODIUM CHLORIDE 0.9 % IV SOLN
350.0000 mg | Freq: Once | INTRAVENOUS | Status: AC
  Administered 2021-08-31: 350 mg via INTRAVENOUS
  Filled 2021-08-31: qty 7

## 2021-08-31 MED ORDER — SODIUM CHLORIDE 0.9 % IV SOLN: Freq: Once | INTRAVENOUS | Status: AC

## 2021-08-31 NOTE — Patient Instructions (Signed)
[facility]  Discharge Instructions: Thank you for choosing [facility] to provide your oncology and hematology care.   If you have a lab appointment with the Cancer Center, please go directly to the Cancer Center and check in at the registration area.   Wear comfortable clothing and clothing appropriate for easy access to any Portacath or PICC line.   We strive to give you quality time with your provider. You may need to reschedule your appointment if you arrive late (15 or more minutes).  Arriving late affects you and other patients whose appointments are after yours.  Also, if you miss three or more appointments without notifying the office, you may be dismissed from the clinic at the provider's discretion.      For prescription refill requests, have your pharmacy contact our office and allow 72 hours for refills to be completed.    Today you received the following chemotherapy and/or immunotherapy agents :  Libtayo      To help prevent nausea and vomiting after your treatment, we encourage you to take your nausea medication as directed.  BELOW ARE SYMPTOMS THAT SHOULD BE REPORTED IMMEDIATELY: *FEVER GREATER THAN 100.4 F (38 C) OR HIGHER *CHILLS OR SWEATING *NAUSEA AND VOMITING THAT IS NOT CONTROLLED WITH YOUR NAUSEA MEDICATION *UNUSUAL SHORTNESS OF BREATH *UNUSUAL BRUISING OR BLEEDING *URINARY PROBLEMS (pain or burning when urinating, or frequent urination) *BOWEL PROBLEMS (unusual diarrhea, constipation, pain near the anus) TENDERNESS IN MOUTH AND THROAT WITH OR WITHOUT PRESENCE OF ULCERS (sore throat, sores in mouth, or a toothache) UNUSUAL RASH, SWELLING OR PAIN  UNUSUAL VAGINAL DISCHARGE OR ITCHING   Items with * indicate a potential emergency and should be followed up as soon as possible or go to the Emergency Department if any problems should occur.  Please show the CHEMOTHERAPY ALERT CARD or IMMUNOTHERAPY ALERT CARD at check-in to  the Emergency Department and triage nurse.  Should you have questions after your visit or need to cancel or reschedule your appointment, please contact [facility]  Dept: [phone]  and follow the prompts.  Office hours are 8:00 a.m. to 4:30 p.m. Monday - Friday. Please note that voicemails left after 4:00 p.m. may not be returned until the following business day.  We are closed weekends and major holidays. You have access to a nurse at all times for urgent questions. Please call the main number to the clinic Dept: [phone] and follow the prompts.   For any non-urgent questions, you may also contact your provider using MyChart. We now offer e-Visits for anyone 18 and older to request care online for non-urgent symptoms. For details visit [email or web address].   Also download the MyChart app! Go to the app store, search "MyChart", open the app, select [facility], and log in with your MyChart username and password.  Due to Covid, a mask is required upon entering the hospital/clinic. If you do not have a mask, one will be given to you upon arrival. For doctor visits, patients may have 1 support person aged 18 or older with them. For treatment visits, patients cannot have anyone with them due to current Covid guidelines and our immunocompromised population.   

## 2021-08-31 NOTE — Progress Notes (Signed)
Hematology and Oncology Follow Up Visit ? ?Shaun Mcmillan ?[id number] ?June 08, 1941 81 y.o. ?08/31/2021 8:12 AM ?[name], MDFry, [name], MD  ? ?Principle Diagnosis: 81 year old man with squamous cell carcinoma of the skin diagnosed in April 2022.  He developed stage IV disease with pulmonary involvement in January 2023.  ? ? ?Prior Therapy: ? ?He is status post parotidectomy with skin resection and rotational flap completed by Dr. Constance Holster in April 2022.  Final pathology showed squamous cell carcinoma. ? ?He is status post radiation therapy under the care of Dr. Isidore Moos completing 33 fractions on December 19, 2020 for a total of 66 Pearline Cables ? ? ?Current therapy: Libtayo 350 mg started on August 10, 2021.  He is here for cycle 2 of therapy. ? ?Interim History: Mr. Berenson returns today for a follow-up visit.  Since the last visit, he denies any new complications related to Libtayo.  He denies any nausea, vomiting or abdominal pain.  He denies any skin rashes or lesions.  He denies any recent hospitalizations or illnesses.  His performance status and quality of life remains unchanged. ? ? ? ? ?Medications: Updated on review. ?Current Outpatient Medications  ?Medication Sig Dispense Refill  ? acetaminophen (TYLENOL) 500 MG tablet Take 1,000 mg by mouth every 8 (eight) hours as needed for moderate pain.    ? amLODipine (NORVASC) 10 MG tablet TAKE 1 TABLET BY MOUTH  DAILY 90 tablet 3  ? aspirin EC 81 MG tablet Take 1 tablet (81 mg total) by mouth daily. Swallow whole. 30 tablet 11  ? atorvastatin (LIPITOR) 40 MG tablet TAKE 1 TABLET BY MOUTH  DAILY 90 tablet 3  ? Cholecalciferol (VITAMIN D PO) Take 5,000 Units by mouth daily.    ? gabapentin (NEURONTIN) 300 MG capsule Take 2 capsules (600 mg total) by mouth 3 (three) times daily. 180 capsule 3  ? latanoprost (XALATAN) 0.005 % ophthalmic solution Place 1 drop into both eyes every morning.    ? LORazepam (ATIVAN) 1 MG tablet Take 1 tablet (1 mg total) by mouth 3 (three)  times daily as needed for anxiety. 60 tablet 5  ? Melatonin 5 MG TABS Take 5 mg by mouth at bedtime.    ? metFORMIN (GLUCOPHAGE) 500 MG tablet TAKE 1 TABLET BY MOUTH  TWICE DAILY WITH A MEAL 180 tablet 3  ? Multiple Vitamin (MULTIVITAMIN) tablet Take 1 tablet by mouth daily.    ? prochlorperazine (COMPAZINE) 10 MG tablet Take 1 tablet (10 mg total) by mouth every 6 (six) hours as needed for nausea or vomiting. 30 tablet 0  ? ramipril (ALTACE) 10 MG capsule TAKE 1 CAPSULE BY MOUTH  TWICE DAILY 180 capsule 3  ? timolol (BETIMOL) 0.5 % ophthalmic solution Place 1 drop into both eyes daily.    ? ?No current facility-administered medications for this visit.  ? ? ? ?Allergies: No Known Allergies ? ? ? ?Physical Exam: ?Blood pressure (!) 143/77, pulse 69, temperature 98.1 ?F (36.7 ?C), temperature source Temporal, resp. rate 18, height '5\' 10"'$  (1.778 m), weight 143 lb 3.2 oz (65 kg), SpO2 100 %. ? ?ECOG: 0 ? ? ? ?General appearance: Alert, awake without any distress. ?Head: Atraumatic without abnormalities ?Oropharynx: Without any thrush or ulcers. ?Eyes: No scleral icterus. ?Lymph nodes: No lymphadenopathy noted in the cervical, supraclavicular, or axillary nodes ?Heart:regular rate and rhythm, without any murmurs or gallops.   ?Lung: Clear to auscultation without any rhonchi, wheezes or dullness to percussion. ?Abdomin: Soft, nontender without any shifting dullness  or ascites. ?Musculoskeletal: No clubbing or cyanosis. ?Neurological: No motor or sensory deficits. ?Skin: No rashes or lesions. ? ? ? ? ? ?Lab Results: ?Lab Results  ?Component Value Date  ? WBC 6.3 08/31/2021  ? HGB 13.8 08/31/2021  ? HCT 41.5 08/31/2021  ? MCV 88.9 08/31/2021  ? PLT 236 08/31/2021  ? ?  Chemistry   ?   ?Component Value Date/Time  ? NA 143 08/10/2021 1132  ? K 3.8 08/10/2021 1132  ? CL 104 08/10/2021 1132  ? CO2 34 (H) 08/10/2021 1132  ? BUN 15 08/10/2021 1132  ? CREATININE 0.75 08/10/2021 1132  ? CREATININE 0.69 (L) 01/08/2021 1553  ?     ?Component Value Date/Time  ? CALCIUM 9.5 08/10/2021 1132  ? ALKPHOS 120 08/10/2021 1132  ? AST 15 08/10/2021 1132  ? ALT 13 08/10/2021 1132  ? BILITOT 0.7 08/10/2021 1132  ?  ? ? ? ? ?Impression and Plan: ? ?81 year old man with: ? ?1.  Stage IV squamous cell carcinoma arising from the skin with pulmonary involvement documented in January 2023. ?  ?The natural course of this disease was reviewed again and treatment choices were reiterated.  He has tolerated the day of his first cycle without any complications and ready to proceed with cycle 2.  I anticipate repeat imaging studies after 4 cycles of therapy if he continues to tolerate it. ? ? ?2.  IV access: Port-A-Cath option has been deferred at this time.  No issues with peripheral veins. ?  ?3.  Antiemetics: Compazine is available to him.  No nausea or vomiting. ?  ?4.  Autoimmune complications: I continue to educate him about potential complication including pneumonitis, colitis and thyroid disease.  He is not experiencing any at this time. ?  ?5.  Follow-up: He will return in 3 weeks for the next cycle of therapy. ?  ?30  minutes were dedicated to this visit.  The time spent on reviewing laboratory data, disease status update, treatment choices and future plan of care discussion. ? ? ?Zola Button, MD ?3/17/20238:12 AM ? ?

## 2021-09-03 ENCOUNTER — Telehealth: Payer: Self-pay | Admitting: *Deleted

## 2021-09-03 NOTE — Telephone Encounter (Signed)
-----   Message from Tildon Husky, RN sent at 08/10/2021  2:29 PM EST ----- ?Regarding: first time treatment call back _ shadad ?Patient received treatment today for the first time. He is followed by Dr. Alen Blew. Patient received libtayo for the first time. No issues at all.  ? ? ?

## 2021-09-03 NOTE — Telephone Encounter (Signed)
Called & spoke with pt to see how he did with his recent treatment.  He states that he is doing well with no side effects.  He did note that when he got home his wrist was swollen where IV was.  He applied ice & that has gone away.  Informed that if it gets sore, red, tender, to let us know but otherwise may let nurse know with next treatment.  He expressed appreciation for call & knows how to reach [place] if needed.  ?

## 2021-09-05 ENCOUNTER — Ambulatory Visit (INDEPENDENT_AMBULATORY_CARE_PROVIDER_SITE_OTHER): Payer: Medicare Other | Admitting: Family Medicine

## 2021-09-05 ENCOUNTER — Encounter: Payer: Medicare Other | Admitting: Family Medicine

## 2021-09-05 ENCOUNTER — Encounter: Payer: Self-pay | Admitting: Family Medicine

## 2021-09-05 VITALS — BP 126/76 | HR 74 | Temp 97.8°F | Wt 144.5 lb

## 2021-09-05 DIAGNOSIS — M1000 Idiopathic gout, unspecified site: Secondary | ICD-10-CM | POA: Diagnosis not present

## 2021-09-05 DIAGNOSIS — C7801 Secondary malignant neoplasm of right lung: Secondary | ICD-10-CM | POA: Diagnosis not present

## 2021-09-05 DIAGNOSIS — E559 Vitamin D deficiency, unspecified: Secondary | ICD-10-CM

## 2021-09-05 DIAGNOSIS — E118 Type 2 diabetes mellitus with unspecified complications: Secondary | ICD-10-CM | POA: Diagnosis not present

## 2021-09-05 DIAGNOSIS — C7802 Secondary malignant neoplasm of left lung: Secondary | ICD-10-CM | POA: Diagnosis not present

## 2021-09-05 DIAGNOSIS — C7989 Secondary malignant neoplasm of other specified sites: Secondary | ICD-10-CM

## 2021-09-05 DIAGNOSIS — I10 Essential (primary) hypertension: Secondary | ICD-10-CM

## 2021-09-05 DIAGNOSIS — N401 Enlarged prostate with lower urinary tract symptoms: Secondary | ICD-10-CM

## 2021-09-05 DIAGNOSIS — E785 Hyperlipidemia, unspecified: Secondary | ICD-10-CM

## 2021-09-05 DIAGNOSIS — N138 Other obstructive and reflux uropathy: Secondary | ICD-10-CM

## 2021-09-05 DIAGNOSIS — E538 Deficiency of other specified B group vitamins: Secondary | ICD-10-CM | POA: Diagnosis not present

## 2021-09-05 LAB — URIC ACID: Uric Acid, Serum: 6.9 mg/dL (ref 4.0–7.8)

## 2021-09-05 LAB — LIPID PANEL
Cholesterol: 124 mg/dL (ref 0–200)
HDL: 58.9 mg/dL (ref >39.00)
LDL Cholesterol: 47 mg/dL (ref 0–99)
NonHDL: 64.8
Total CHOL/HDL Ratio: 2
Triglycerides: 88 mg/dL (ref 0.0–149.0)
VLDL: 17.6 mg/dL (ref 0.0–40.0)

## 2021-09-05 LAB — BASIC METABOLIC PANEL
BUN: 14 mg/dL (ref 6–23)
CO2: 32 mEq/L (ref 19–32)
Calcium: 10.3 mg/dL (ref 8.4–10.5)
Chloride: 101 mEq/L (ref 96–112)
Creatinine, Ser: 0.81 mg/dL (ref 0.40–1.50)
GFR: 83.11 mL/min (ref >60.00)
Glucose, Bld: 122 mg/dL — ABNORMAL HIGH (ref 70–99)
Potassium: 4.3 mEq/L (ref 3.5–5.1)
Sodium: 141 mEq/L (ref 135–145)

## 2021-09-05 LAB — CBC WITH DIFFERENTIAL/PLATELET
Basophils Absolute: 0 10*3/uL (ref 0.0–0.1)
Basophils Relative: 0.3 % (ref 0.0–3.0)
Eosinophils Absolute: 0.2 10*3/uL (ref 0.0–0.7)
Eosinophils Relative: 2.4 % (ref 0.0–5.0)
HCT: 41 % (ref 39.0–52.0)
Hemoglobin: 13.8 g/dL (ref 13.0–17.0)
Lymphocytes Relative: 20.3 % (ref 12.0–46.0)
Lymphs Abs: 1.4 10*3/uL (ref 0.7–4.0)
MCHC: 33.6 g/dL (ref 30.0–36.0)
MCV: 88.9 fl (ref 78.0–100.0)
Monocytes Absolute: 0.5 10*3/uL (ref 0.1–1.0)
Monocytes Relative: 7.5 % (ref 3.0–12.0)
Neutro Abs: 4.6 10*3/uL (ref 1.4–7.7)
Neutrophils Relative %: 69.5 % (ref 43.0–77.0)
Platelets: 262 10*3/uL (ref 150.0–400.0)
RBC: 4.61 Mil/uL (ref 4.22–5.81)
RDW: 13.3 % (ref 11.5–15.5)
WBC: 6.7 10*3/uL (ref 4.0–10.5)

## 2021-09-05 LAB — TSH: TSH: 3.15 u[IU]/mL (ref 0.35–5.50)

## 2021-09-05 LAB — PSA: PSA: 1.54 ng/mL (ref 0.10–4.00)

## 2021-09-05 LAB — HEPATIC FUNCTION PANEL
ALT: 15 U/L (ref 0–53)
AST: 19 U/L (ref 0–37)
Albumin: 4.5 g/dL (ref 3.5–5.2)
Alkaline Phosphatase: 130 U/L — ABNORMAL HIGH (ref 39–117)
Bilirubin, Direct: 0.2 mg/dL (ref 0.0–0.3)
Total Bilirubin: 0.8 mg/dL (ref 0.2–1.2)
Total Protein: 7 g/dL (ref 6.0–8.3)

## 2021-09-05 LAB — HEMOGLOBIN A1C: Hgb A1c MFr Bld: 6.1 % (ref 4.6–6.5)

## 2021-09-05 LAB — VITAMIN B12: Vitamin B-12: 677 pg/mL (ref 211–911)

## 2021-09-05 LAB — VITAMIN D 25 HYDROXY (VIT D DEFICIENCY, FRACTURES): VITD: 74.07 ng/mL (ref 30.00–100.00)

## 2021-09-05 MED ORDER — LORAZEPAM 1 MG PO TABS
1.0000 mg | ORAL_TABLET | Freq: Three times a day (TID) | ORAL | 5 refills | Status: DC | PRN
Start: 2021-09-05 — End: 2023-10-13

## 2021-09-05 NOTE — Progress Notes (Signed)
? ?Subjective:  ? ? Patient ID: Shaun Mcmillan, male    DOB: 1940/12/12, 81 y.o.   MRN: [id number] ? ?HPI ?Here to follow up on issues. In general he is feeling fairly well. 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 He sees Dr. Zola Button for this, and he is receiving treatments of Libtayo. He is tolerating these quite well. Otherwise he has no concerns. He tried to eat a healthy diet and he still takes short walks every day. His BP is stable. He does not check glucoses at home. He has not had a gout attack in several years.  ? ? ?Review of Systems  ?Constitutional: Negative.   ?HENT: Negative.    ?Eyes: Negative.   ?Respiratory: Negative.    ?Cardiovascular: Negative.   ?Gastrointestinal: Negative.   ?Genitourinary: Negative.   ?Musculoskeletal: Negative.   ?Skin: Negative.   ?Neurological: Negative.   ?Psychiatric/Behavioral: Negative.    ? ?   ?Objective:  ? Physical Exam ?Constitutional:   ?   General: He is not in acute distress. ?   Appearance: He is well-developed. He is not diaphoretic.  ?   Comments: Somewhat thin   ?HENT:  ?   Head: Normocephalic and atraumatic.  ?   Right Ear: External ear normal.  ?   Left Ear: External ear normal.  ?   Nose: Nose normal.  ?   Mouth/Throat:  ?   Pharynx: No oropharyngeal exudate.  ?Eyes:  ?   General: No scleral icterus.    ?   Right eye: No discharge.     ?   Left eye: No discharge.  ?   Conjunctiva/sclera: Conjunctivae normal.  ?   Pupils: Pupils are equal, round, and reactive to light.  ?Neck:  ?   Thyroid: No thyromegaly.  ?   Vascular: No JVD.  ?   Trachea: No tracheal deviation.  ?Cardiovascular:  ?   Rate and Rhythm: Normal rate and regular rhythm.  ?   Heart sounds: Normal heart sounds. No murmur heard. ?  No friction rub. No gallop.  ?Pulmonary:  ?   Effort: Pulmonary effort is normal. No respiratory distress.  ?   Breath sounds: Normal breath sounds. No wheezing or rales.  ?Chest:  ?   Chest wall:  No tenderness.  ?Abdominal:  ?   General: Bowel sounds are normal. There is no distension.  ?   Palpations: Abdomen is soft. There is no mass.  ?   Tenderness: There is no abdominal tenderness. There is no guarding or rebound.  ?Genitourinary: ?   Penis: Normal. No tenderness.   ?   Testes: Normal.  ?   Prostate: Normal.  ?   Rectum: Normal. Guaiac result negative.  ?Musculoskeletal:     ?   General: No tenderness. Normal range of motion.  ?   Cervical back: Neck supple.  ?Lymphadenopathy:  ?   Cervical: No cervical adenopathy.  ?Skin: ?   General: Skin is warm and dry.  ?   Coloration: Skin is not pale.  ?   Findings: No erythema or rash.  ?Neurological:  ?   Mental Status: He is alert and oriented to person, place, and time.  ?   Cranial Nerves: No cranial nerve deficit.  ?   Motor: No abnormal muscle tone.  ?   Coordination: Coordination normal.  ?   Deep Tendon Reflexes: Reflexes are normal and symmetric. Reflexes normal.  ?Psychiatric:     ?  Behavior: Behavior normal.     ?   Thought Content: Thought content normal.     ?   Judgment: Judgment normal.  ? ? ? ? ? ?   ?Assessment & Plan:  ?His HTN and gout are stable. We will get fasting labs today to check lipids, an A1c, and levels of vitamin D and B12. He will follow up with Oncology. We spent a total of ( 31  ) minutes reviewing records and discussing these issues.  ?Alysia Penna, MD ? ? ?

## 2021-09-13 DIAGNOSIS — H905 Unspecified sensorineural hearing loss: Secondary | ICD-10-CM | POA: Diagnosis not present

## 2021-09-21 ENCOUNTER — Inpatient Hospital Stay: Payer: Medicare Other | Admitting: Oncology

## 2021-09-21 ENCOUNTER — Other Ambulatory Visit: Payer: Self-pay

## 2021-09-21 ENCOUNTER — Inpatient Hospital Stay: Payer: Medicare Other

## 2021-09-21 ENCOUNTER — Inpatient Hospital Stay: Payer: Medicare Other | Attending: Oncology

## 2021-09-21 VITALS — Resp 18

## 2021-09-21 VITALS — BP 133/79 | HR 61 | Temp 98.1°F | Wt 145.7 lb

## 2021-09-21 DIAGNOSIS — C7989 Secondary malignant neoplasm of other specified sites: Secondary | ICD-10-CM

## 2021-09-21 DIAGNOSIS — Z5112 Encounter for antineoplastic immunotherapy: Secondary | ICD-10-CM | POA: Insufficient documentation

## 2021-09-21 DIAGNOSIS — Z79899 Other long term (current) drug therapy: Secondary | ICD-10-CM | POA: Diagnosis not present

## 2021-09-21 DIAGNOSIS — C44329 Squamous cell carcinoma of skin of other parts of face: Secondary | ICD-10-CM | POA: Insufficient documentation

## 2021-09-21 DIAGNOSIS — C7800 Secondary malignant neoplasm of unspecified lung: Secondary | ICD-10-CM | POA: Diagnosis not present

## 2021-09-21 LAB — CMP (CANCER CENTER ONLY)
ALT: 15 U/L (ref 0–44)
AST: 16 U/L (ref 15–41)
Albumin: 3.9 g/dL (ref 3.5–5.0)
Alkaline Phosphatase: 118 U/L (ref 38–126)
Anion gap: 8 (ref 5–15)
BUN: 11 mg/dL (ref 8–23)
CO2: 26 mmol/L (ref 22–32)
Calcium: 9.5 mg/dL (ref 8.9–10.3)
Chloride: 105 mmol/L (ref 98–111)
Creatinine: 0.81 mg/dL (ref 0.61–1.24)
GFR, Estimated: >60 mL/min (ref >60)
Glucose, Bld: 201 mg/dL — ABNORMAL HIGH (ref 70–99)
Potassium: 3.8 mmol/L (ref 3.5–5.1)
Sodium: 139 mmol/L (ref 135–145)
Total Bilirubin: 0.7 mg/dL (ref 0.3–1.2)
Total Protein: 6.7 g/dL (ref 6.5–8.1)

## 2021-09-21 LAB — CBC WITH DIFFERENTIAL (CANCER CENTER ONLY)
Abs Immature Granulocytes: 0.01 10*3/uL (ref 0.00–0.07)
Basophils Absolute: 0 10*3/uL (ref 0.0–0.1)
Basophils Relative: 0 %
Eosinophils Absolute: 0.2 10*3/uL (ref 0.0–0.5)
Eosinophils Relative: 3 %
HCT: 36.5 % — ABNORMAL LOW (ref 39.0–52.0)
Hemoglobin: 12.4 g/dL — ABNORMAL LOW (ref 13.0–17.0)
Immature Granulocytes: 0 %
Lymphocytes Relative: 16 %
Lymphs Abs: 0.9 10*3/uL (ref 0.7–4.0)
MCH: 30 pg (ref 26.0–34.0)
MCHC: 34 g/dL (ref 30.0–36.0)
MCV: 88.2 fL (ref 80.0–100.0)
Monocytes Absolute: 0.5 10*3/uL (ref 0.1–1.0)
Monocytes Relative: 9 %
Neutro Abs: 4 10*3/uL (ref 1.7–7.7)
Neutrophils Relative %: 72 %
Platelet Count: 223 10*3/uL (ref 150–400)
RBC: 4.14 MIL/uL — ABNORMAL LOW (ref 4.22–5.81)
RDW: 13.1 % (ref 11.5–15.5)
WBC Count: 5.6 10*3/uL (ref 4.0–10.5)
nRBC: 0 % (ref 0.0–0.2)

## 2021-09-21 LAB — TSH: TSH: 2.151 u[IU]/mL (ref 0.320–4.118)

## 2021-09-21 MED ORDER — SODIUM CHLORIDE 0.9 % IV SOLN
350.0000 mg | Freq: Once | INTRAVENOUS | Status: AC
  Administered 2021-09-21: 350 mg via INTRAVENOUS
  Filled 2021-09-21: qty 7

## 2021-09-21 MED ORDER — SODIUM CHLORIDE 0.9 % IV SOLN: Freq: Once | INTRAVENOUS | Status: AC

## 2021-09-21 NOTE — Progress Notes (Signed)
Hematology and Oncology Follow Up Visit ? ?Shaun Mcmillan ?[id number] ?11/14/1940 81 y.o. ?09/21/2021 7:57 AM ?Shaun Mcmillan, MDFry, Shaun Holter, MD  ? 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 ? ? ?Prior Therapy: ? ?He is status post parotidectomy with skin resection and rotational flap completed by Dr. Constance Holster in April 2022.  Final pathology showed squamous cell carcinoma. ? ?He is status post radiation therapy under the care of Dr. Isidore Moos completing 33 fractions on December 19, 2020 for a total of 66 Pearline Cables ? ? ?Current therapy: Libtayo 350 mg started on August 10, 2021.  He is here for cycle 3 of therapy. ? ?Interim History: Shaun Mcmillan is here for repeat evaluation.  Since last visit, he reports no major changes in his health.  He denies any complications related to the immunotherapy.  He denies any nausea, fatigue or skin rash.  He denies any pruritus.  His performance status and quality of life remained excellent. ? ? ? ? ?Medications: Reviewed without changes. ?Current Outpatient Medications  ?Medication Sig Dispense Refill  ? acetaminophen (TYLENOL) 500 MG tablet Take 1,000 mg by mouth every 8 (eight) hours as needed for moderate pain.    ? amLODipine (NORVASC) 10 MG tablet TAKE 1 TABLET BY MOUTH  DAILY 90 tablet 3  ? aspirin EC 81 MG tablet Take 1 tablet (81 mg total) by mouth daily. Swallow whole. 30 tablet 11  ? atorvastatin (LIPITOR) 40 MG tablet TAKE 1 TABLET BY MOUTH  DAILY 90 tablet 3  ? Cholecalciferol (VITAMIN D PO) Take 5,000 Units by mouth daily.    ? gabapentin (NEURONTIN) 300 MG capsule Take 2 capsules (600 mg total) by mouth 3 (three) times daily. 180 capsule 3  ? latanoprost (XALATAN) 0.005 % ophthalmic solution Place 1 drop into both eyes every morning.    ? LORazepam (ATIVAN) 1 MG tablet Take 1 tablet (1 mg total) by  mouth 3 (three) times daily as needed for anxiety. 60 tablet 5  ? Melatonin 5 MG TABS Take 5 mg by mouth at bedtime.    ? metFORMIN (GLUCOPHAGE) 500 MG tablet TAKE 1 TABLET BY MOUTH  TWICE DAILY WITH A MEAL 180 tablet 3  ? Multiple Vitamin (MULTIVITAMIN) tablet Take 1 tablet by mouth daily.    ? prochlorperazine (COMPAZINE) 10 MG tablet Take 1 tablet (10 mg total) by mouth every 6 (six) hours as needed for nausea or vomiting. 30 tablet 0  ? ramipril (ALTACE) 10 MG capsule TAKE 1 CAPSULE BY MOUTH  TWICE DAILY 180 capsule 3  ? timolol (BETIMOL) 0.5 % ophthalmic solution Place 1 drop into both eyes daily.    ? ?No current facility-administered medications for this visit.  ? ? ? ?Allergies: No Known Allergies ? ? ? ?Physical Exam: ?Blood pressure 133/79, pulse 61, temperature 98.1 ?F (36.7 ?C), temperature source Tympanic, weight 145 lb 11.2 oz (66.1 kg), SpO2 98 %. ? ? ?ECOG: 0 ? ? ? ?General appearance: Comfortable appearing without any discomfort ?Head: Normocephalic without any trauma ?Oropharynx: Mucous membranes are moist and pink without any thrush or ulcers. ?Eyes: Pupils are equal and round reactive to light. ?Lymph nodes: No cervical, supraclavicular, inguinal or axillary lymphadenopathy.   ?Heart:regular rate and rhythm.  S1 and S2 without leg edema. ?Lung: Clear without any rhonchi or wheezes.  No dullness to percussion. ?Abdomin: Soft,  nontender, nondistended with good bowel sounds.  No hepatosplenomegaly. ?Musculoskeletal: No joint deformity or effusion.  Full range of motion noted. ?Neurological: No deficits noted on motor, sensory and deep tendon reflex exam. ?Skin: No petechial rash or dryness.  Appeared moist.  ? ? ? ? ? ?Lab Results: ?Lab Results  ?Component Value Date  ? WBC 6.7 09/05/2021  ? HGB 13.8 09/05/2021  ? HCT 41.0 09/05/2021  ? MCV 88.9 09/05/2021  ? PLT 262.0 09/05/2021  ? ?  Chemistry   ?   ?Component Value Date/Time  ? NA 141 09/05/2021 1020  ? K 4.3 09/05/2021 1020  ? CL 101 09/05/2021  1020  ? CO2 32 09/05/2021 1020  ? BUN 14 09/05/2021 1020  ? CREATININE 0.81 09/05/2021 1020  ? CREATININE 0.78 08/31/2021 0750  ? CREATININE 0.69 (L) 01/08/2021 1553  ?    ?Component Value Date/Time  ? CALCIUM 10.3 09/05/2021 1020  ? ALKPHOS 130 (H) 09/05/2021 1020  ? AST 19 09/05/2021 1020  ? AST 18 08/31/2021 0750  ? ALT 15 09/05/2021 1020  ? ALT 16 08/31/2021 0750  ? BILITOT 0.8 09/05/2021 1020  ? BILITOT 0.9 08/31/2021 0750  ?  ? ? ? ? ?Impression and Plan: ? ?81 year old man with: ? ?1.  Skin cancer diagnosed in April 2022.  He developed stage IV squamous cell carcinoma with pulmonary involvement documented in January 2023. ?  ?He continues to tolerate current therapy without any major complications.  Risks and benefits of proceeding with treatment today were reviewed with the plan to update his staging studies after the next cycle.  Alternative treatment options including systemic chemotherapy among others were reiterated.  He is agreeable to proceed at this time. ? ? ?2.  IV access: Peripheral veins are currently in use.  Port-A-Cath option has been deferred at this time. ?  ?3.  Antiemetics: No nausea or vomiting reported at this time. ?  ?4.  Autoimmune complications: These complications including pneumonitis, colitis and thyroid disease.  Dermatitis and pruritus are also expected. ?  ?5.  Follow-up: In 3 weeks for repeat evaluation and next cycle of therapy. ?  ?30  minutes were spent on this encounter.  The time was dedicated to reviewing laboratory data, disease status update and future treatment choices. ? ? ?Zola Button, MD ?4/7/20237:57 AM ? ?

## 2021-09-21 NOTE — Patient Instructions (Signed)
[facility]  Discharge Instructions: Thank you for choosing [facility] to provide your oncology and hematology care.   If you have a lab appointment with the Cancer Center, please go directly to the Cancer Center and check in at the registration area.   Wear comfortable clothing and clothing appropriate for easy access to any Portacath or PICC line.   We strive to give you quality time with your provider. You may need to reschedule your appointment if you arrive late (15 or more minutes).  Arriving late affects you and other patients whose appointments are after yours.  Also, if you miss three or more appointments without notifying the office, you may be dismissed from the clinic at the provider's discretion.      For prescription refill requests, have your pharmacy contact our office and allow 72 hours for refills to be completed.    Today you received the following chemotherapy and/or immunotherapy agents :  Libtayo      To help prevent nausea and vomiting after your treatment, we encourage you to take your nausea medication as directed.  BELOW ARE SYMPTOMS THAT SHOULD BE REPORTED IMMEDIATELY: *FEVER GREATER THAN 100.4 F (38 C) OR HIGHER *CHILLS OR SWEATING *NAUSEA AND VOMITING THAT IS NOT CONTROLLED WITH YOUR NAUSEA MEDICATION *UNUSUAL SHORTNESS OF BREATH *UNUSUAL BRUISING OR BLEEDING *URINARY PROBLEMS (pain or burning when urinating, or frequent urination) *BOWEL PROBLEMS (unusual diarrhea, constipation, pain near the anus) TENDERNESS IN MOUTH AND THROAT WITH OR WITHOUT PRESENCE OF ULCERS (sore throat, sores in mouth, or a toothache) UNUSUAL RASH, SWELLING OR PAIN  UNUSUAL VAGINAL DISCHARGE OR ITCHING   Items with * indicate a potential emergency and should be followed up as soon as possible or go to the Emergency Department if any problems should occur.  Please show the CHEMOTHERAPY ALERT CARD or IMMUNOTHERAPY ALERT CARD at check-in to  the Emergency Department and triage nurse.  Should you have questions after your visit or need to cancel or reschedule your appointment, please contact [facility]  Dept: [phone]  and follow the prompts.  Office hours are 8:00 a.m. to 4:30 p.m. Monday - Friday. Please note that voicemails left after 4:00 p.m. may not be returned until the following business day.  We are closed weekends and major holidays. You have access to a nurse at all times for urgent questions. Please call the main number to the clinic Dept: [phone] and follow the prompts.   For any non-urgent questions, you may also contact your provider using MyChart. We now offer e-Visits for anyone 18 and older to request care online for non-urgent symptoms. For details visit [email or web address].   Also download the MyChart app! Go to the app store, search "MyChart", open the app, select , and log in with your MyChart username and password.  Due to Covid, a mask is required upon entering the hospital/clinic. If you do not have a mask, one will be given to you upon arrival. For doctor visits, patients may have 1 support person aged 18 or older with them. For treatment visits, patients cannot have anyone with them due to current Covid guidelines and our immunocompromised population.   

## 2021-10-02 ENCOUNTER — Telehealth: Payer: Self-pay | Admitting: *Deleted

## 2021-10-02 ENCOUNTER — Other Ambulatory Visit: Payer: Self-pay | Admitting: *Deleted

## 2021-10-02 ENCOUNTER — Ambulatory Visit (HOSPITAL_COMMUNITY)
Admission: RE | Admit: 2021-10-02 | Discharge: 2021-10-02 | Disposition: A | Discharge status: Home / Self Care | Payer: Medicare Other | Source: Ambulatory Visit | Attending: Oncology | Admitting: Oncology

## 2021-10-02 DIAGNOSIS — R600 Localized edema: Secondary | ICD-10-CM | POA: Insufficient documentation

## 2021-10-02 DIAGNOSIS — Z9622 Myringotomy tube(s) status: Secondary | ICD-10-CM | POA: Diagnosis not present

## 2021-10-02 DIAGNOSIS — H6122 Impacted cerumen, left ear: Secondary | ICD-10-CM | POA: Diagnosis not present

## 2021-10-02 DIAGNOSIS — C7800 Secondary malignant neoplasm of unspecified lung: Secondary | ICD-10-CM | POA: Diagnosis not present

## 2021-10-02 NOTE — Telephone Encounter (Signed)
-----   Message from Wyatt Portela, MD sent at 10/02/2021  9:28 AM EDT ----- ?Regarding: RE: Concern for DVT ?Please arrange for LE dopplers today or tomorrow. Thanks ?----- Message ----- ?From: Rolene Course, RN ?Sent: 10/02/2021   9:22 AM EDT ?To: Wyatt Portela, MD ?Subject: Concern for DVT                               ? ?This patient called & said he has a spot on his left calf that is swollen, bruised & painful since 4/10.  He states ice helps some with the pain.  He is concerned it could be a DVT.  Please advise. ? ?Thanks, ?Bethena Roys ? ? ?

## 2021-10-02 NOTE — Progress Notes (Signed)
Left lower extremity venous duplex has been completed. ?Preliminary results can be found in CV Proc through chart review.  ?Results were given to Bethena Roys at Dr. Hazeline Junker office. ? ?10/02/21 10:51 AM ?Carlos Levering RVT   ?

## 2021-10-02 NOTE — Telephone Encounter (Signed)
PC to patient, informed him he has appointment for doppler study on L leg at [facility] today at 1100, he needs to arrive 15 minutes early.  Patient verbalizes understanding. ?

## 2021-10-02 NOTE — Telephone Encounter (Signed)
PC to patient, informed him today's doppler study was negative for DVT, Dr. Alen Blew will discuss all results with him at his next appointment.  Patient asked if he should continue using ice on his calf - informed him he may apply ice to affected area 2-3 times a day.  He verbalizes understanding. ?

## 2021-10-03 DIAGNOSIS — L821 Other seborrheic keratosis: Secondary | ICD-10-CM | POA: Diagnosis not present

## 2021-10-03 DIAGNOSIS — Z85828 Personal history of other malignant neoplasm of skin: Secondary | ICD-10-CM | POA: Diagnosis not present

## 2021-10-03 DIAGNOSIS — D692 Other nonthrombocytopenic purpura: Secondary | ICD-10-CM | POA: Diagnosis not present

## 2021-10-03 DIAGNOSIS — L570 Actinic keratosis: Secondary | ICD-10-CM | POA: Diagnosis not present

## 2021-10-03 DIAGNOSIS — D1801 Hemangioma of skin and subcutaneous tissue: Secondary | ICD-10-CM | POA: Diagnosis not present

## 2021-10-03 DIAGNOSIS — L218 Other seborrheic dermatitis: Secondary | ICD-10-CM | POA: Diagnosis not present

## 2021-10-05 ENCOUNTER — Telehealth: Payer: Self-pay | Admitting: Oncology

## 2021-10-05 NOTE — Telephone Encounter (Signed)
Called patient regarding upcoming appointments, patient is notified. 

## 2021-10-12 ENCOUNTER — Inpatient Hospital Stay: Payer: Medicare Other

## 2021-10-12 ENCOUNTER — Inpatient Hospital Stay (HOSPITAL_BASED_OUTPATIENT_CLINIC_OR_DEPARTMENT_OTHER): Payer: Medicare Other | Admitting: Oncology

## 2021-10-12 ENCOUNTER — Other Ambulatory Visit: Payer: Self-pay

## 2021-10-12 VITALS — BP 126/70 | HR 64 | Temp 97.9°F | Resp 16 | Wt 141.8 lb

## 2021-10-12 DIAGNOSIS — Z5112 Encounter for antineoplastic immunotherapy: Secondary | ICD-10-CM | POA: Diagnosis not present

## 2021-10-12 DIAGNOSIS — Z79899 Other long term (current) drug therapy: Secondary | ICD-10-CM | POA: Diagnosis not present

## 2021-10-12 DIAGNOSIS — C7989 Secondary malignant neoplasm of other specified sites: Secondary | ICD-10-CM

## 2021-10-12 DIAGNOSIS — C7800 Secondary malignant neoplasm of unspecified lung: Secondary | ICD-10-CM | POA: Diagnosis not present

## 2021-10-12 DIAGNOSIS — C44329 Squamous cell carcinoma of skin of other parts of face: Secondary | ICD-10-CM | POA: Diagnosis not present

## 2021-10-12 LAB — CBC WITH DIFFERENTIAL (CANCER CENTER ONLY)
Abs Immature Granulocytes: 0.02 10*3/uL (ref 0.00–0.07)
Basophils Absolute: 0 10*3/uL (ref 0.0–0.1)
Basophils Relative: 0 %
Eosinophils Absolute: 0.1 10*3/uL (ref 0.0–0.5)
Eosinophils Relative: 2 %
HCT: 39.7 % (ref 39.0–52.0)
Hemoglobin: 13.5 g/dL (ref 13.0–17.0)
Immature Granulocytes: 0 %
Lymphocytes Relative: 16 %
Lymphs Abs: 1.1 10*3/uL (ref 0.7–4.0)
MCH: 29.9 pg (ref 26.0–34.0)
MCHC: 34 g/dL (ref 30.0–36.0)
MCV: 88 fL (ref 80.0–100.0)
Monocytes Absolute: 0.5 10*3/uL (ref 0.1–1.0)
Monocytes Relative: 7 %
Neutro Abs: 5.2 10*3/uL (ref 1.7–7.7)
Neutrophils Relative %: 75 %
Platelet Count: 242 10*3/uL (ref 150–400)
RBC: 4.51 MIL/uL (ref 4.22–5.81)
RDW: 13.1 % (ref 11.5–15.5)
WBC Count: 6.9 10*3/uL (ref 4.0–10.5)
nRBC: 0 % (ref 0.0–0.2)

## 2021-10-12 LAB — CMP (CANCER CENTER ONLY)
ALT: 13 U/L (ref 0–44)
AST: 15 U/L (ref 15–41)
Albumin: 4.1 g/dL (ref 3.5–5.0)
Alkaline Phosphatase: 123 U/L (ref 38–126)
Anion gap: 4 — ABNORMAL LOW (ref 5–15)
BUN: 14 mg/dL (ref 8–23)
CO2: 31 mmol/L (ref 22–32)
Calcium: 9.6 mg/dL (ref 8.9–10.3)
Chloride: 103 mmol/L (ref 98–111)
Creatinine: 0.82 mg/dL (ref 0.61–1.24)
GFR, Estimated: >60 mL/min (ref >60)
Glucose, Bld: 244 mg/dL — ABNORMAL HIGH (ref 70–99)
Potassium: 3.5 mmol/L (ref 3.5–5.1)
Sodium: 138 mmol/L (ref 135–145)
Total Bilirubin: 0.7 mg/dL (ref 0.3–1.2)
Total Protein: 6.9 g/dL (ref 6.5–8.1)

## 2021-10-12 LAB — TSH: TSH: 1.706 u[IU]/mL (ref 0.350–4.500)

## 2021-10-12 MED ORDER — SODIUM CHLORIDE 0.9 % IV SOLN
350.0000 mg | Freq: Once | INTRAVENOUS | Status: AC
  Administered 2021-10-12: 350 mg via INTRAVENOUS
  Filled 2021-10-12: qty 7

## 2021-10-12 MED ORDER — SODIUM CHLORIDE 0.9 % IV SOLN: Freq: Once | INTRAVENOUS | Status: AC

## 2021-10-12 NOTE — Patient Instructions (Signed)
[facility]COL[facility]  Discharge Instructions: Thank you for choosing [facility] to provide your oncology and hematology care.   If you have a lab appointment with the Cancer Center, please go directly to the Cancer Center and check in at the registration area.   Wear comfortable clothing and clothing appropriate for easy access to any Portacath or PICC line.   We strive to give you quality time with your provider. You may need to reschedule your appointment if you arrive late (15 or more minutes).  Arriving late affects you and other patients whose appointments are after yours.  Also, if you miss three or more appointments without notifying the office, you may be dismissed from the clinic at the provider's discretion.      For prescription refill requests, have your pharmacy contact our office and allow 72 hours for refills to be completed.    Today you received the following chemotherapy and/or immunotherapy agents :  Libtayo      To help prevent nausea and vomiting after your treatment, we encourage you to take your nausea medication as directed.  BELOW ARE SYMPTOMS THAT SHOULD BE REPORTED IMMEDIATELY: *FEVER GREATER THAN 100.4 F (38 C) OR HIGHER *CHILLS OR SWEATING *NAUSEA AND VOMITING THAT IS NOT CONTROLLED WITH YOUR NAUSEA MEDICATION *UNUSUAL SHORTNESS OF BREATH *UNUSUAL BRUISING OR BLEEDING *URINARY PROBLEMS (pain or burning when urinating, or frequent urination) *BOWEL PROBLEMS (unusual diarrhea, constipation, pain near the anus) TENDERNESS IN MOUTH AND THROAT WITH OR WITHOUT PRESENCE OF ULCERS (sore throat, sores in mouth, or a toothache) UNUSUAL RASH, SWELLING OR PAIN  UNUSUAL VAGINAL DISCHARGE OR ITCHING   Items with * indicate a potential emergency and should be followed up as soon as possible or go to the Emergency Department if any problems should occur.  Please show the CHEMOTHERAPY ALERT CARD or IMMUNOTHERAPY ALERT CARD at check-in to  the Emergency Department and triage nurse.  Should you have questions after your visit or need to cancel or reschedule your appointment, please contact [facility]  Dept: [phone]  and follow the prompts.  Office hours are 8:00 a.m. to 4:30 p.m. Monday - Friday. Please note that voicemails left after 4:00 p.m. may not be returned until the following business day.  We are closed weekends and major holidays. You have access to a nurse at all times for urgent questions. Please call the main number to the clinic Dept: [phone] and follow the prompts.   For any non-urgent questions, you may also contact your provider using MyChart. We now offer e-Visits for anyone 18 and older to request care online for non-urgent symptoms. For details visit [email or web address].   Also download the MyChart app! Go to the app store, search "MyChart", open the app, select [facility], and log in with your MyChart username and password.  Due to Covid, a mask is required upon entering the hospital/clinic. If you do not have a mask, one will be given to you upon arrival. For doctor visits, patients may have 1 support Shaun Mcmillan aged 18 or older with them. For treatment visits, patients cannot have anyone with them due to current Covid guidelines and our immunocompromised population.   

## 2021-10-12 NOTE — Progress Notes (Signed)
Hematology and Oncology Follow Up Visit ? ?Shaun Mcmillan ?[id number] ?1940/08/16 81 y.o. ?10/12/2021 11:26 AM ?Shaun Mcmillan, MDFry, [name], MD  ? ?Principle Diagnosis: 81 year old man with squamous cell carcinoma of the skin diagnosed in 2022.  He developed stage IV with pulmonary involvement documented in January 2023.  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 ? ? ?Prior Therapy: ? ?He is status post parotidectomy with skin resection and rotational flap completed by Dr. Constance Holster in April 2022.  Final pathology showed squamous cell carcinoma. ? ?He is status post radiation therapy under the care of Dr. Isidore Moos completing 33 fractions on December 19, 2020 for a total of 66 Pearline Cables ? ? ?Current therapy: Libtayo 350 mg started on August 10, 2021.  He is here for cycle 4 of therapy. ? ?Interim History: [name] is here for repeat follow-up.  Since the last visit, he reports feeling well without any major complaints.  He has tolerated current therapy without any complaints.  He denies any nausea, vomiting or abdominal pain.  He denies any hospitalizations or illnesses.  He denies any skin rash, fatigue or pulmonary complaints.  He denies any diarrhea. ? ? ? ? ?Medications: Updated on review. ?Current Outpatient Medications  ?Medication Sig Dispense Refill  ? acetaminophen (TYLENOL) 500 MG tablet Take 1,000 mg by mouth every 8 (eight) hours as needed for moderate pain.    ? amLODipine (NORVASC) 10 MG tablet TAKE 1 TABLET BY MOUTH  DAILY 90 tablet 3  ? aspirin EC 81 MG tablet Take 1 tablet (81 mg total) by mouth daily. Swallow whole. 30 tablet 11  ? atorvastatin (LIPITOR) 40 MG tablet TAKE 1 TABLET BY MOUTH  DAILY 90 tablet 3  ? Cholecalciferol (VITAMIN D PO) Take 5,000 Units by mouth daily.    ? gabapentin (NEURONTIN) 300 MG capsule Take 2 capsules (600 mg total) by mouth 3 (three) times daily. 180 capsule 3  ? latanoprost (XALATAN) 0.005 % ophthalmic solution Place 1 drop into both  eyes every morning.    ? LORazepam (ATIVAN) 1 MG tablet Take 1 tablet (1 mg total) by mouth 3 (three) times daily as needed for anxiety. 60 tablet 5  ? Melatonin 5 MG TABS Take 5 mg by mouth at bedtime.    ? metFORMIN (GLUCOPHAGE) 500 MG tablet TAKE 1 TABLET BY MOUTH  TWICE DAILY WITH A MEAL 180 tablet 3  ? Multiple Vitamin (MULTIVITAMIN) tablet Take 1 tablet by mouth daily.    ? prochlorperazine (COMPAZINE) 10 MG tablet Take 1 tablet (10 mg total) by mouth every 6 (six) hours as needed for nausea or vomiting. 30 tablet 0  ? ramipril (ALTACE) 10 MG capsule TAKE 1 CAPSULE BY MOUTH  TWICE DAILY 180 capsule 3  ? timolol (BETIMOL) 0.5 % ophthalmic solution Place 1 drop into both eyes daily.    ? ?No current facility-administered medications for this visit.  ? ? ? ?Allergies: No Known Allergies ? ? ? ?Physical Exam: ? ?Blood pressure 126/70, pulse 64, temperature 97.9 ?F (36.6 ?C), temperature source Temporal, resp. rate 16, weight 141 lb 12.8 oz (64.3 kg), SpO2 98 %. ? ? ?ECOG: 0 ? ? ?General appearance: Alert, awake without any distress. ?Head: Atraumatic without abnormalities ?Oropharynx: Without any thrush or ulcers. ?Eyes: No scleral icterus. ?Lymph nodes: No lymphadenopathy noted in the cervical, supraclavicular, or axillary nodes ?Heart:regular rate and rhythm, without any murmurs or gallops.   ?Lung: Clear to auscultation without any rhonchi, wheezes  or dullness to percussion. ?Abdomin: Soft, nontender without any shifting dullness or ascites. ?Musculoskeletal: No clubbing or cyanosis. ?Neurological: No motor or sensory deficits. ?Skin: Mild protrusion noted on the left calf.  No erythema, induration or tenderness. ? ? ? ? ? ? ?Lab Results: ?Lab Results  ?Component Value Date  ? WBC 6.9 10/12/2021  ? HGB 13.5 10/12/2021  ? HCT 39.7 10/12/2021  ? MCV 88.0 10/12/2021  ? PLT 242 10/12/2021  ? ?  Chemistry   ?   ?Component Value Date/Time  ? NA 139 09/21/2021 0802  ? K 3.8 09/21/2021 0802  ? CL 105 09/21/2021 0802  ?  CO2 26 09/21/2021 0802  ? BUN 11 09/21/2021 0802  ? CREATININE 0.81 09/21/2021 0802  ? CREATININE 0.69 (L) 01/08/2021 1553  ?    ?Component Value Date/Time  ? CALCIUM 9.5 09/21/2021 0802  ? ALKPHOS 118 09/21/2021 0802  ? AST 16 09/21/2021 0802  ? ALT 15 09/21/2021 0802  ? BILITOT 0.7 09/21/2021 0802  ?  ? ? ? ? ?Impression and Plan: ? ?81 year old man with: ? ?1.  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 ?  ?The natural course of his disease was reviewed at this time and treatment choices were discussed.  Continues to be on Libtayo which she has tolerated very well without any major complaints.  Risks and benefits of continuing this treatment were discussed at this time.  Potential complications including immune mediated issues, GI toxicity among others were reiterated.  The plan is to update his staging scan before the next visit. ? ? ?2.  IV access: No issues reported with use of peripheral veins.  Port-A-Cath option has been deferred. ?  ?3.  Antiemetics: Compazine is available to home without any nausea or vomiting. ?  ?4.  Autoimmune complications: I continue to reiterate and educate him about these complications.  These include thyroid disease, colitis, pneumonitis, hepatitis among others. ? ?5.  Skin protrusion on the left lower extremity: Unclear etiology but no evidence of deep vein thrombosis or malignancy.  Appears to be improving slowly.  We will continue to monitor on subsequent examinations. ?  ?6.  Follow-up: He will return in 3 weeks for repeat follow-up. ?  ?30  minutes were dedicated to this visit.  The time was spent on reviewing laboratory data, disease status update, outlining future plan of care and future alternative treatment options. ? ? ?Zola Button, MD ?4/28/202311:26 AM ? ?

## 2021-10-15 ENCOUNTER — Telehealth: Payer: Self-pay | Admitting: *Deleted

## 2021-10-15 NOTE — Telephone Encounter (Signed)
[name] PHONE CALL, SPOKE WITH PATIENT AND HE WANTS HIS FU APPT. MOVED FROM 10-26-21 WITH DR. Isidore Moos, PATIENT AGREED FOR APPT. TO BE RESCHEDULED FOR 11-07-21 @ 3:30 PM ?

## 2021-10-16 DIAGNOSIS — H401111 Primary open-angle glaucoma, right eye, mild stage: Secondary | ICD-10-CM | POA: Diagnosis not present

## 2021-10-16 DIAGNOSIS — E119 Type 2 diabetes mellitus without complications: Secondary | ICD-10-CM | POA: Diagnosis not present

## 2021-10-16 DIAGNOSIS — H401122 Primary open-angle glaucoma, left eye, moderate stage: Secondary | ICD-10-CM | POA: Diagnosis not present

## 2021-10-16 DIAGNOSIS — H2513 Age-related nuclear cataract, bilateral: Secondary | ICD-10-CM | POA: Diagnosis not present

## 2021-10-26 ENCOUNTER — Ambulatory Visit: Payer: Medicare Other | Admitting: Radiation Oncology

## 2021-10-30 ENCOUNTER — Ambulatory Visit (HOSPITAL_COMMUNITY)
Admission: RE | Admit: 2021-10-30 | Discharge: 2021-10-30 | Disposition: A | Discharge status: Home / Self Care | Payer: Medicare Other | Source: Ambulatory Visit | Attending: Oncology | Admitting: Oncology

## 2021-10-30 ENCOUNTER — Encounter (HOSPITAL_COMMUNITY): Payer: Self-pay

## 2021-10-30 DIAGNOSIS — J984 Other disorders of lung: Secondary | ICD-10-CM | POA: Diagnosis not present

## 2021-10-30 DIAGNOSIS — C07 Malignant neoplasm of parotid gland: Secondary | ICD-10-CM | POA: Diagnosis not present

## 2021-10-30 DIAGNOSIS — C7989 Secondary malignant neoplasm of other specified sites: Secondary | ICD-10-CM | POA: Diagnosis not present

## 2021-10-30 DIAGNOSIS — M47816 Spondylosis without myelopathy or radiculopathy, lumbar region: Secondary | ICD-10-CM | POA: Diagnosis not present

## 2021-10-30 DIAGNOSIS — N281 Cyst of kidney, acquired: Secondary | ICD-10-CM | POA: Diagnosis not present

## 2021-10-30 DIAGNOSIS — I2510 Atherosclerotic heart disease of native coronary artery without angina pectoris: Secondary | ICD-10-CM | POA: Diagnosis not present

## 2021-10-30 DIAGNOSIS — K8689 Other specified diseases of pancreas: Secondary | ICD-10-CM | POA: Diagnosis not present

## 2021-10-30 MED ORDER — IOHEXOL 300 MG/ML  SOLN
100.0000 mL | Freq: Once | INTRAMUSCULAR | Status: AC | PRN
  Administered 2021-10-30: 80 mL via INTRAVENOUS

## 2021-10-30 MED ORDER — SODIUM CHLORIDE (PF) 0.9 % IJ SOLN
INTRAMUSCULAR | Status: AC
  Filled 2021-10-30: qty 50

## 2021-10-31 ENCOUNTER — Telehealth: Payer: Self-pay | Admitting: Oncology

## 2021-10-31 NOTE — Telephone Encounter (Signed)
Called patient regarding upcoming appointment, patient is notified. °

## 2021-11-02 ENCOUNTER — Inpatient Hospital Stay: Payer: Medicare Other

## 2021-11-02 ENCOUNTER — Inpatient Hospital Stay: Payer: Medicare Other | Attending: Oncology

## 2021-11-02 ENCOUNTER — Inpatient Hospital Stay (HOSPITAL_BASED_OUTPATIENT_CLINIC_OR_DEPARTMENT_OTHER): Payer: Medicare Other | Admitting: Oncology

## 2021-11-02 ENCOUNTER — Other Ambulatory Visit: Payer: Self-pay

## 2021-11-02 VITALS — BP 138/63 | HR 65 | Temp 98.9°F | Resp 17 | Ht 70.0 in | Wt 143.7 lb

## 2021-11-02 VITALS — BP 128/75 | HR 65 | Temp 97.7°F | Resp 16

## 2021-11-02 DIAGNOSIS — Z79899 Other long term (current) drug therapy: Secondary | ICD-10-CM | POA: Diagnosis not present

## 2021-11-02 DIAGNOSIS — C7802 Secondary malignant neoplasm of left lung: Secondary | ICD-10-CM | POA: Diagnosis not present

## 2021-11-02 DIAGNOSIS — C7989 Secondary malignant neoplasm of other specified sites: Secondary | ICD-10-CM

## 2021-11-02 DIAGNOSIS — C44329 Squamous cell carcinoma of skin of other parts of face: Secondary | ICD-10-CM | POA: Insufficient documentation

## 2021-11-02 DIAGNOSIS — Z5112 Encounter for antineoplastic immunotherapy: Secondary | ICD-10-CM | POA: Insufficient documentation

## 2021-11-02 LAB — CBC WITH DIFFERENTIAL (CANCER CENTER ONLY)
Abs Immature Granulocytes: 0.01 10*3/uL (ref 0.00–0.07)
Basophils Absolute: 0 10*3/uL (ref 0.0–0.1)
Basophils Relative: 0 %
Eosinophils Absolute: 0.5 10*3/uL (ref 0.0–0.5)
Eosinophils Relative: 6 %
HCT: 37.3 % — ABNORMAL LOW (ref 39.0–52.0)
Hemoglobin: 13 g/dL (ref 13.0–17.0)
Immature Granulocytes: 0 %
Lymphocytes Relative: 12 %
Lymphs Abs: 0.9 10*3/uL (ref 0.7–4.0)
MCH: 30.2 pg (ref 26.0–34.0)
MCHC: 34.9 g/dL (ref 30.0–36.0)
MCV: 86.7 fL (ref 80.0–100.0)
Monocytes Absolute: 0.5 10*3/uL (ref 0.1–1.0)
Monocytes Relative: 7 %
Neutro Abs: 5.7 10*3/uL (ref 1.7–7.7)
Neutrophils Relative %: 75 %
Platelet Count: 226 10*3/uL (ref 150–400)
RBC: 4.3 MIL/uL (ref 4.22–5.81)
RDW: 13 % (ref 11.5–15.5)
WBC Count: 7.7 10*3/uL (ref 4.0–10.5)
nRBC: 0 % (ref 0.0–0.2)

## 2021-11-02 LAB — CMP (CANCER CENTER ONLY)
ALT: 9 U/L (ref 0–44)
AST: 12 U/L — ABNORMAL LOW (ref 15–41)
Albumin: 4 g/dL (ref 3.5–5.0)
Alkaline Phosphatase: 123 U/L (ref 38–126)
Anion gap: 8 (ref 5–15)
BUN: 14 mg/dL (ref 8–23)
CO2: 29 mmol/L (ref 22–32)
Calcium: 9.4 mg/dL (ref 8.9–10.3)
Chloride: 102 mmol/L (ref 98–111)
Creatinine: 0.93 mg/dL (ref 0.61–1.24)
GFR, Estimated: >60 mL/min (ref >60)
Glucose, Bld: 220 mg/dL — ABNORMAL HIGH (ref 70–99)
Potassium: 3.6 mmol/L (ref 3.5–5.1)
Sodium: 139 mmol/L (ref 135–145)
Total Bilirubin: 0.8 mg/dL (ref 0.3–1.2)
Total Protein: 6.9 g/dL (ref 6.5–8.1)

## 2021-11-02 LAB — TSH: TSH: 2.647 u[IU]/mL (ref 0.350–4.500)

## 2021-11-02 MED ORDER — SODIUM CHLORIDE 0.9 % IV SOLN
350.0000 mg | Freq: Once | INTRAVENOUS | Status: AC
  Administered 2021-11-02: 350 mg via INTRAVENOUS
  Filled 2021-11-02: qty 7

## 2021-11-02 MED ORDER — SODIUM CHLORIDE 0.9 % IV SOLN: Freq: Once | INTRAVENOUS | Status: AC

## 2021-11-02 NOTE — Patient Instructions (Signed)
[facility]  Discharge Instructions: Thank you for choosing [facility] to provide your oncology and hematology care.   If you have a lab appointment with the [facility], please go directly to the [facility] and check in at the registration area.   Wear comfortable clothing and clothing appropriate for easy access to any Portacath or PICC line.   We strive to give you quality time with your provider. You may need to reschedule your appointment if you arrive late (15 or more minutes).  Arriving late affects you and other patients whose appointments are after yours.  Also, if you miss three or more appointments without notifying the office, you may be dismissed from the clinic at the provider's discretion.      For prescription refill requests, have your pharmacy contact our office and allow 72 hours for refills to be completed.    Today you received the following chemotherapy and/or immunotherapy agents :  Libtayo      To help prevent nausea and vomiting after your treatment, we encourage you to take your nausea medication as directed.  BELOW ARE SYMPTOMS THAT SHOULD BE REPORTED IMMEDIATELY: *FEVER GREATER THAN 100.4 F (38 C) OR HIGHER *CHILLS OR SWEATING *NAUSEA AND VOMITING THAT IS NOT CONTROLLED WITH YOUR NAUSEA MEDICATION *UNUSUAL SHORTNESS OF BREATH *UNUSUAL BRUISING OR BLEEDING *URINARY PROBLEMS (pain or burning when urinating, or frequent urination) *BOWEL PROBLEMS (unusual diarrhea, constipation, pain near the anus) TENDERNESS IN MOUTH AND THROAT WITH OR WITHOUT PRESENCE OF ULCERS (sore throat, sores in mouth, or a toothache) UNUSUAL RASH, SWELLING OR PAIN  UNUSUAL VAGINAL DISCHARGE OR ITCHING   Items with * indicate a potential emergency and should be followed up as soon as possible or go to the Emergency Department if any problems should occur.  Please show the CHEMOTHERAPY ALERT CARD or IMMUNOTHERAPY ALERT CARD at check-in to  the Emergency Department and triage nurse.  Should you have questions after your visit or need to cancel or reschedule your appointment, please contact [facility]  Dept: [phone]  and follow the prompts.  Office hours are 8:00 a.m. to 4:30 p.m. Monday - Friday. Please note that voicemails left after 4:00 p.m. may not be returned until the following business day.  We are closed weekends and major holidays. You have access to a nurse at all times for urgent questions. Please call the main number to the clinic Dept: [phone] and follow the prompts.   For any non-urgent questions, you may also contact your provider using MyChart. We now offer e-Visits for anyone 18 and older to request care online for non-urgent symptoms. For details visit [email or web address].   Also download the MyChart app! Go to the app store, search "MyChart", open the app, select [facility], and log in with your MyChart username and password.  Due to Covid, a mask is required upon entering the hospital/clinic. If you do not have a mask, one will be given to you upon arrival. For doctor visits, patients may have 1 support person aged 18 or older with them. For treatment visits, patients cannot have anyone with them due to current Covid guidelines and our immunocompromised population.   

## 2021-11-02 NOTE — Progress Notes (Signed)
Hematology and Oncology Follow Up Visit  [facility] [id number] 12-19-1940 81 y.o. 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   Prior Therapy:  He is status post parotidectomy with skin resection and rotational flap completed by Dr. Constance Holster in April 2022.  Final pathology showed squamous cell carcinoma.  He is status post radiation therapy under the care of Dr. Isidore Moos completing 33 fractions on December 19, 2020 for a total of 66 Gray   Current therapy: Libtayo 350 mg started on August 10, 2021.  He is here for cycle 5 of therapy.  Interim History: [name] returns today for repeat evaluation.  Since last visit, he reports feeling well without any major complaints.  He has tolerated Libtayo without any complaints.  He denies any nausea, vomiting or abdominal pain.  He denies any skin rashes or lesions.     Medications: Reviewed without changes. Current Outpatient Medications  Medication Sig Dispense Refill   acetaminophen (TYLENOL) 500 MG tablet Take 1,000 mg by mouth every 8 (eight) hours as needed for moderate pain.     amLODipine (NORVASC) 10 MG tablet TAKE 1 TABLET BY MOUTH  DAILY 90 tablet 3   aspirin EC 81 MG tablet Take 1 tablet (81 mg total) by mouth daily. Swallow whole. 30 tablet 11   atorvastatin (LIPITOR) 40 MG tablet TAKE 1 TABLET BY MOUTH  DAILY 90 tablet 3   Cholecalciferol (VITAMIN D PO) Take 5,000 Units by mouth daily.     gabapentin (NEURONTIN) 300 MG capsule Take 2 capsules (600 mg total) by mouth 3 (three) times daily. 180 capsule 3   latanoprost (XALATAN) 0.005 % ophthalmic solution Place 1 drop into both eyes every morning.     LORazepam (ATIVAN) 1 MG tablet Take 1 tablet (1 mg total) by mouth 3 (three) times  daily as needed for anxiety. 60 tablet 5   Melatonin 5 MG TABS Take 5 mg by mouth at bedtime.     metFORMIN (GLUCOPHAGE) 500 MG tablet TAKE 1 TABLET BY MOUTH  TWICE DAILY WITH A MEAL 180 tablet 3   Multiple Vitamin (MULTIVITAMIN) tablet Take 1 tablet by mouth daily.     prochlorperazine (COMPAZINE) 10 MG tablet Take 1 tablet (10 mg total) by mouth every 6 (six) hours as needed for nausea or vomiting. 30 tablet 0   ramipril (ALTACE) 10 MG capsule TAKE 1 CAPSULE BY MOUTH  TWICE DAILY 180 capsule 3   timolol (BETIMOL) 0.5 % ophthalmic solution Place 1 drop into both eyes daily.     No current facility-administered medications for this visit.     Allergies: No Known Allergies    Physical Exam:   Blood pressure 138/63, pulse 65, temperature 98.9 F (37.2 C), temperature source Temporal, resp. rate 17, height '5\' 10"'$  (1.778 m), weight 143 lb 11.2 oz (65.2 kg), SpO2 100 %.   ECOG: 0   General appearance: Comfortable appearing without any discomfort Head: Normocephalic without any trauma Oropharynx: Mucous membranes are moist and pink without any thrush or ulcers. Eyes: Pupils are equal and round reactive to light. Lymph nodes: No cervical, supraclavicular, inguinal or axillary lymphadenopathy.   Heart:regular rate and rhythm.  S1 and S2 without leg edema. Lung: Clear without any rhonchi or wheezes.  No dullness  to percussion. Abdomin: Soft, nontender, nondistended with good bowel sounds.  No hepatosplenomegaly. Musculoskeletal: No joint deformity or effusion.  Full range of motion noted. Neurological: No deficits noted on motor, sensory and deep tendon reflex exam. Skin: No petechial rash or dryness.  Appeared moist.         Lab Results: Lab Results  Component Value Date   WBC 6.9 10/12/2021   HGB 13.5 10/12/2021   HCT 39.7 10/12/2021   MCV 88.0 10/12/2021   PLT 242 10/12/2021     Chemistry      Component Value Date/Time   NA 138 10/12/2021 1114   K 3.5 10/12/2021  1114   CL 103 10/12/2021 1114   CO2 31 10/12/2021 1114   BUN 14 10/12/2021 1114   CREATININE 0.82 10/12/2021 1114   CREATININE 0.69 (L) 01/08/2021 1553      Component Value Date/Time   CALCIUM 9.6 10/12/2021 1114   ALKPHOS 123 10/12/2021 1114   AST 15 10/12/2021 1114   ALT 13 10/12/2021 1114   BILITOT 0.7 10/12/2021 1114      IMPRESSION: 1. Mixed appearance of the previously hypermetabolic pulmonary nodules. The dominant left upper lobe pulmonary nodule has enlarged and currently measures 1.5 by 1.3 cm. Two other nodules are relatively stable and 2 of the previous small pulmonary nodules have resolved. 2. 1 cm hyperdense lesion in the dome of the right hepatic lobe is probably a hemangioma or similar benign lesion but technically nonspecific. This may merit surveillance given the patient's history. 3. Unexplained dorsal pancreatic duct dilatation (to 7 mm in diameter) in the pancreatic head and body, without biliary dilatation or definite parenchymal lesion in the pancreas. This could be further assessed with MRCP with and without contrast, or ERCP. 4. Other imaging findings of potential clinical significance: Aortic Atherosclerosis (ICD10-I70.0). Coronary and systemic atherosclerosis. Aortic valve atherosclerosis. Scattered scarring in the lung bases. Scattered sigmoid colon diverticula. Lumbar spondylosis and degenerative disc disease.  Impression and Plan:  81 year old man with:  1.  Squamous cell carcinoma of the skin diagnosed in 2022.  He developed stage IV disease with pulmonary involvement.   His disease status is updated at this time and treatment choices were reviewed.  CT scan obtained on Oct 30, 2021 was personally reviewed and showed overall stable disease with overall mixed response.  No new areas of metastasis noted at this time.  Risks and benefits of continuing this treatment versus alternative treatment options were reviewed.  Different salvage systemic  therapy versus palliative radiation therapy to enlarging lesion could be also considered.  After discussion he is agreeable to proceed.  The plan is to update his staging scans in 3 months.   2.  IV access: Risks and benefits of Port-A-Cath insertion versus continuing peripheral veins were discussed.   3.  Antiemetics: No nausea or vomiting reported at this time.  Compazine is available to him.   4.  Autoimmune complications: He has not experienced any complications at this time.  These include pneumonitis, colitis thyroid disease.  5.  Skin protrusion on the left lower extremity:    6.  Follow-up: He will return in 3 weeks for a follow-up visit.   30  minutes were spent on this encounter.  The time was dedicated to reviewing imaging studies, treatment choices and outlining future plan of care review.  Zola Button, MD 5/19/20237:55 AM

## 2021-11-03 ENCOUNTER — Other Ambulatory Visit: Payer: Self-pay | Admitting: Neurology

## 2021-11-07 ENCOUNTER — Ambulatory Visit
Admission: RE | Admit: 2021-11-07 | Discharge: 2021-11-07 | Disposition: A | Discharge status: Home / Self Care | Payer: Medicare Other | Source: Ambulatory Visit | Attending: Radiation Oncology | Admitting: Radiation Oncology

## 2021-11-07 ENCOUNTER — Encounter: Payer: Self-pay | Admitting: Radiation Oncology

## 2021-11-07 ENCOUNTER — Other Ambulatory Visit: Payer: Self-pay

## 2021-11-07 VITALS — BP 134/73 | HR 64 | Temp 97.2°F | Resp 18 | Ht 70.0 in | Wt 144.5 lb

## 2021-11-07 DIAGNOSIS — C7800 Secondary malignant neoplasm of unspecified lung: Secondary | ICD-10-CM | POA: Diagnosis not present

## 2021-11-07 DIAGNOSIS — C07 Malignant neoplasm of parotid gland: Secondary | ICD-10-CM | POA: Insufficient documentation

## 2021-11-07 DIAGNOSIS — Z923 Personal history of irradiation: Secondary | ICD-10-CM | POA: Insufficient documentation

## 2021-11-07 DIAGNOSIS — Z7982 Long term (current) use of aspirin: Secondary | ICD-10-CM | POA: Diagnosis not present

## 2021-11-07 DIAGNOSIS — Z7984 Long term (current) use of oral hypoglycemic drugs: Secondary | ICD-10-CM | POA: Insufficient documentation

## 2021-11-07 DIAGNOSIS — Z79899 Other long term (current) drug therapy: Secondary | ICD-10-CM | POA: Insufficient documentation

## 2021-11-07 DIAGNOSIS — M47816 Spondylosis without myelopathy or radiculopathy, lumbar region: Secondary | ICD-10-CM | POA: Insufficient documentation

## 2021-11-07 DIAGNOSIS — C7989 Secondary malignant neoplasm of other specified sites: Secondary | ICD-10-CM | POA: Diagnosis not present

## 2021-11-07 DIAGNOSIS — K8689 Other specified diseases of pancreas: Secondary | ICD-10-CM | POA: Insufficient documentation

## 2021-11-07 NOTE — Progress Notes (Signed)
[facility] presents today for follow-up after completing radiation to his left parotid on 12/19/2020  Pain issues, if any: Patient denies Using a feeding tube?: N/A Weight changes, if any:  Wt Readings from Last 3 Encounters:  11/07/21 144 lb 8 oz (65.5 kg)  11/02/21 143 lb 11.2 oz (65.2 kg)  10/12/21 141 lb 12.8 oz (64.3 kg)   Swallowing issues, if any: Denies--continues to be able to eat/drink a wide variety  Smoking or chewing tobacco? None Using fluoride trays daily? N/A--Reports he sees his community dentist every 4 months. Last ENT visit was on: 10/02/2021 Saw Dr. Izora [facility]:  --Return visit. Diagnosed with metastatic lung lesions. Currently undergoing immunotherapy. Last of 4 rounds is in a couple of weeks. He is getting a hearing aid for the left ear. He wants to know if he needs to have the tube removed. On exam there is no neck masses. Everything has healed nicely. There is impacted cerumen in the left ear canal that was cleaned out. There is no signs of infection. The tube is in place. --He does not need to have the tube removed. He will likely require ear cleaning periodically. Follow-up as needed.  Other notable issues, if any: Had F/U with medical oncologist [facility] on 11/02/2021 before 5th cycle of Libtayo. CT C/A/P done 10/30/2021. Reports his new hearing aides are working well. Overall reports he's doing well and pleased with his continues progress

## 2021-11-07 NOTE — Progress Notes (Signed)
Radiation Oncology         [phone] ________________________________  Name: [facility] MRN: [id number]  Date: 11/07/2021  DOB: 1940/11/30  Follow-Up Visit Note  CC: [name], MD  Izora Gala, MD  Diagnosis and Prior Radiotherapy:       ICD-10-CM   1. 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 [facility]) Staging form: Cutaneous Carcinoma of the Head and Neck, AJCC 8th Edition - Clinical stage from 10/02/2020: Stage Unknown (cTX, cN3b) - Unsigned Stage prefix: Initial diagnosis Extraosseous extension: Unknown  Now with what appears to be STAGE IV metastatic disease (to lungs)  Radiation Treatment Dates: 11/01/2020 through 12/19/2020 Site Technique Total Dose (Gy) Dose per Fx (Gy) Completed Fx Beam Energies  Parotid, Left: HN_Lt_parotid IMRT 66/66 2 33/33 6X    CHIEF COMPLAINT:  Here for follow-up and surveillance of parotid cancer  Narrative:   [facility] presents today for follow-up  - doing well with Libtayo.  Pain issues, if any: Patient denies Using a feeding tube?: N/A Weight changes, if any:  Wt Readings from Last 3 Encounters:  11/07/21 144 lb 8 oz (65.5 kg)  11/02/21 143 lb 11.2 oz (65.2 kg)  10/12/21 141 lb 12.8 oz (64.3 kg)   Swallowing issues, if any: Denies--continues to be able to eat/drink a wide variety  Smoking or chewing tobacco? None Using fluoride trays daily? N/A--Reports he sees his community dentist every 4 months. Last ENT visit was on: 10/02/2021 Saw Dr. Izora Gala:  --Return visit. Diagnosed with metastatic lung lesions. Currently undergoing immunotherapy. Last of 4 rounds is in a couple of weeks. He is getting a hearing aid for the left ear. He wants to know if he needs to have the tube removed. On exam  there is no neck masses. Everything has healed nicely. There is impacted cerumen in the left ear canal that was cleaned out. There is no signs of infection. The tube is in place. --He does not need to have the tube removed. He will likely require ear cleaning periodically. Follow-up as needed.  Other notable issues, if any: Had F/U with medical oncologist Dr. Alen Blew on 11/02/2021 before 5th cycle of Libtayo. CT C/A/P done 10/30/2021 and satisfactory thus far. Reports his new hearing aides are working well. Overall reports he's doing well and pleased with his continues progress LEFT Finger numbness - stable. No cancer on C spine MRI.  He stopped  Vit E and pentoxyfylline.    ALLERGIES:  has No Known Allergies.  Meds: Current Outpatient Medications  Medication Sig Dispense Refill   acetaminophen (TYLENOL) 500 MG tablet Take 1,000 mg by mouth every 8 (eight) hours as needed for moderate pain.     amLODipine (NORVASC) 10 MG tablet TAKE 1 TABLET BY MOUTH  DAILY 90 tablet 3   aspirin EC 81 MG tablet Take 1 tablet (81 mg total) by mouth daily. Swallow whole. 30 tablet 11   atorvastatin (LIPITOR) 40 MG tablet TAKE 1 TABLET BY MOUTH  DAILY 90 tablet 3   Cholecalciferol (VITAMIN D PO) Take 5,000 Units by mouth daily.     gabapentin (NEURONTIN) 300 MG capsule TAKE 2 CAPSULES BY MOUTH 3 TIMES DAILY. 180 capsule 1   latanoprost (XALATAN) 0.005 %  ophthalmic solution Place 1 drop into both eyes every morning.     LORazepam (ATIVAN) 1 MG tablet Take 1 tablet (1 mg total) by mouth 3 (three) times daily as needed for anxiety. 60 tablet 5   Melatonin 5 MG TABS Take 5 mg by mouth at bedtime.     metFORMIN (GLUCOPHAGE) 500 MG tablet TAKE 1 TABLET BY MOUTH  TWICE DAILY WITH A MEAL 180 tablet 3   Multiple Vitamin (MULTIVITAMIN) tablet Take 1 tablet by mouth daily.     prochlorperazine (COMPAZINE) 10 MG tablet Take 1 tablet (10 mg total) by mouth every 6 (six) hours as needed for nausea or vomiting. 30 tablet 0    ramipril (ALTACE) 10 MG capsule TAKE 1 CAPSULE BY MOUTH  TWICE DAILY 180 capsule 3   timolol (BETIMOL) 0.5 % ophthalmic solution Place 1 drop into both eyes daily.     No current facility-administered medications for this encounter.    Physical Findings: The patient is in no acute distress. Patient is alert and oriented. Wt Readings from Last 3 Encounters:  11/07/21 144 lb 8 oz (65.5 kg)  11/02/21 143 lb 11.2 oz (65.2 kg)  10/12/21 141 lb 12.8 oz (64.3 kg)    height is '5\' 10"'$  (1.778 m) and weight is 144 lb 8 oz (65.5 kg). His temporal temperature is 97.2 F (36.2 C) (abnormal). His blood pressure is 134/73 and his pulse is 64. His respiration is 18 and oxygen saturation is 100%. .  General: Alert and oriented, in no acute distress HEENT: Head is normocephalic. Left facial droop is mild. Hearing aids b/l work well. Mouth clear. Neck: Neck is notable for no palpable masses Skin: Skin in treatment fields shows satisfactory healing  Psychiatric: Judgment and insight are intact. Affect is appropriate. Heart RRR Chest CTAB    Lab Findings: Lab Results  Component Value Date   WBC 7.7 11/02/2021   HGB 13.0 11/02/2021   HCT 37.3 (L) 11/02/2021   MCV 86.7 11/02/2021   PLT 226 11/02/2021    Lab Results  Component Value Date   TSH 2.647 11/02/2021    Radiographic Findings: CT CHEST ABDOMEN PELVIS W CONTRAST  Result Date: 10/31/2021 CLINICAL DATA:  Left parotid squamous cell carcinoma. Biopsy 06/26/2021 revealed well-differentiated squamous cell carcinoma along a left craniofacial shave biopsy. Hypermetabolic pulmonary nodules. * Tracking Code: BO * EXAM: CT CHEST, ABDOMEN, AND PELVIS WITH CONTRAST TECHNIQUE: Multidetector CT imaging of the chest, abdomen and pelvis was performed following the standard protocol during bolus administration of intravenous contrast. RADIATION DOSE REDUCTION: This exam was performed according to the departmental dose-optimization program which includes  automated exposure control, adjustment of the mA and/or kV according to patient size and/or use of iterative reconstruction technique. CONTRAST:  67m OMNIPAQUE IOHEXOL 300 MG/ML  SOLN COMPARISON:  Multiple exams, including PET-CT 08/10/2021 FINDINGS: CT CHEST FINDINGS Cardiovascular: Coronary, aortic arch, and branch vessel atherosclerotic vascular disease. Aortic valve calcifications noted. Mediastinum/Nodes: No pathologic adenopathy in the chest. Lungs/Pleura: 7 by 6 mm right middle lobe pulmonary nodule on image 92 series 4, stable. Stable mild subpleural nodularity in the left upper lobe on image 36 series 4. Left upper lobe pulmonary nodule measures 1.5 by 1.3 cm on image 71 series 4, previously 1.3 by 1.0 cm. 0.3 cm left upper lobe nodule anteriorly on image 80 of series 4, previously 0.4 cm. Mild scarring in both lower lobes and in the lingula. A previous left upper lobe nodule around the level of image 56 of  series 4 of the current exam has resolved although there is some mild localized airway thickening in this vicinity. A previous small right upper lobe nodule near the minor fissure has resolved. Musculoskeletal: Unremarkable CT ABDOMEN PELVIS FINDINGS Hepatobiliary: 1.0 by 0.7 by 1.1 cm focus of accentuated enhancement in the dome of the right hepatic lobe on image 42 series 2, passively a flash filling hemangioma but technically nonspecific. No well-defined lesion in this vicinity is visible on the delayed phase images. There is no hypermetabolic activity in this vicinity on prior PET-CT of 08/10/2021. In the inferior margin of segment 4 of the liver I do not see a definite lesion to correlate with the questionable focus of hypermetabolic activity on prior PET-CT. 0.4 cm nonspecific lesion inferiorly in the right hepatic lobe on image 69 series 2, technically nonspecific although statistically likely to be a small benign cyst or similar lesion. Mildly contracted gallbladder. Pancreas: Dorsal pancreatic  duct dilatation in the pancreatic head and body, up to 7 mm in diameter. No pancreatic parenchymal abnormality is identified. Spleen: Unremarkable Adrenals/Urinary Tract: Both adrenal glands appear unremarkable. Bosniak category 1 cyst of the right kidney upper pole measuring 5.2 by 4.5 cm. Adrenal glands unremarkable. Slight wall thickening in the urinary bladder probably from nondistention. Stomach/Bowel: Scattered sigmoid colon diverticula. Vascular/Lymphatic: Atherosclerosis is present, including aortoiliac atherosclerotic disease. Reproductive: Unremarkable Other: No supplemental non-categorized findings. Musculoskeletal: Lumbar spondylosis and degenerative disc disease. IMPRESSION: 1. Mixed appearance of the previously hypermetabolic pulmonary nodules. The dominant left upper lobe pulmonary nodule has enlarged and currently measures 1.5 by 1.3 cm. Two other nodules are relatively stable and 2 of the previous small pulmonary nodules have resolved. 2. 1 cm hyperdense lesion in the dome of the right hepatic lobe is probably a hemangioma or similar benign lesion but technically nonspecific. This may merit surveillance given the patient's history. 3. Unexplained dorsal pancreatic duct dilatation (to 7 mm in diameter) in the pancreatic head and body, without biliary dilatation or definite parenchymal lesion in the pancreas. This could be further assessed with MRCP with and without contrast, or ERCP. 4. Other imaging findings of potential clinical significance: Aortic Atherosclerosis (ICD10-I70.0). Coronary and systemic atherosclerosis. Aortic valve atherosclerosis. Scattered scarring in the lung bases. Scattered sigmoid colon diverticula. Lumbar spondylosis and degenerative disc disease. Electronically Signed   By: Van Clines M.D.   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 On surveillance chest CT, has developed nodules highly suspicious for metastatic disease.   Dr. Alen Blew is now treating him with Libtayo and he is tolerating that well.     2) Nutritional Status: no issues Wt Readings from Last 3 Encounters:  11/07/21 144 lb 8 oz (65.5 kg)  11/02/21 143 lb 11.2 oz (65.2 kg)  10/12/21 141 lb 12.8 oz (64.3 kg)     PEG tube: none  3)  Swallowing: functional, no issues  5) Dental: Encouraged to continue regular followup with dentistry, and dental hygiene including fluoride rinses.   6) Thyroid function:  check annually in med/onc - WNL Lab Results  Component Value Date   TSH 2.647 11/02/2021    7) LEFT Finger numbness - stable. No cancer on C spine MRI.  He stopped  Vit E and pentoxyfylline.    8) Follow-up with me PRN - he and his wife know  to call any time. They are pleased with this plan.  On date of service, in total, I spent 30 minutes on this encounter. Patient was seen in person. _____________________________________   Eppie Gibson, MD

## 2021-11-22 ENCOUNTER — Inpatient Hospital Stay: Payer: Medicare Other | Attending: Oncology

## 2021-11-22 ENCOUNTER — Other Ambulatory Visit: Payer: Self-pay | Admitting: Lab

## 2021-11-22 ENCOUNTER — Inpatient Hospital Stay: Payer: Medicare Other

## 2021-11-22 ENCOUNTER — Other Ambulatory Visit: Payer: Self-pay

## 2021-11-22 ENCOUNTER — Inpatient Hospital Stay (HOSPITAL_BASED_OUTPATIENT_CLINIC_OR_DEPARTMENT_OTHER): Payer: Medicare Other | Admitting: Oncology

## 2021-11-22 VITALS — BP 152/75 | HR 65 | Temp 97.7°F | Resp 18 | Ht 70.0 in | Wt 141.6 lb

## 2021-11-22 DIAGNOSIS — C44329 Squamous cell carcinoma of skin of other parts of face: Secondary | ICD-10-CM | POA: Diagnosis not present

## 2021-11-22 DIAGNOSIS — Z5112 Encounter for antineoplastic immunotherapy: Secondary | ICD-10-CM | POA: Diagnosis not present

## 2021-11-22 DIAGNOSIS — R220 Localized swelling, mass and lump, head: Secondary | ICD-10-CM | POA: Insufficient documentation

## 2021-11-22 DIAGNOSIS — C7989 Secondary malignant neoplasm of other specified sites: Secondary | ICD-10-CM

## 2021-11-22 DIAGNOSIS — C7800 Secondary malignant neoplasm of unspecified lung: Secondary | ICD-10-CM | POA: Diagnosis not present

## 2021-11-22 DIAGNOSIS — Z79899 Other long term (current) drug therapy: Secondary | ICD-10-CM | POA: Insufficient documentation

## 2021-11-22 LAB — CBC WITH DIFFERENTIAL (CANCER CENTER ONLY)
Abs Immature Granulocytes: 0.02 10*3/uL (ref 0.00–0.07)
Basophils Absolute: 0 10*3/uL (ref 0.0–0.1)
Basophils Relative: 1 %
Eosinophils Absolute: 0.9 10*3/uL — ABNORMAL HIGH (ref 0.0–0.5)
Eosinophils Relative: 13 %
HCT: 41.3 % (ref 39.0–52.0)
Hemoglobin: 14.2 g/dL (ref 13.0–17.0)
Immature Granulocytes: 0 %
Lymphocytes Relative: 14 %
Lymphs Abs: 1 10*3/uL (ref 0.7–4.0)
MCH: 29.8 pg (ref 26.0–34.0)
MCHC: 34.4 g/dL (ref 30.0–36.0)
MCV: 86.8 fL (ref 80.0–100.0)
Monocytes Absolute: 0.5 10*3/uL (ref 0.1–1.0)
Monocytes Relative: 8 %
Neutro Abs: 4.5 10*3/uL (ref 1.7–7.7)
Neutrophils Relative %: 64 %
Platelet Count: 252 10*3/uL (ref 150–400)
RBC: 4.76 MIL/uL (ref 4.22–5.81)
RDW: 13.2 % (ref 11.5–15.5)
WBC Count: 6.9 10*3/uL (ref 4.0–10.5)
nRBC: 0 % (ref 0.0–0.2)

## 2021-11-22 LAB — CMP (CANCER CENTER ONLY)
ALT: 11 U/L (ref 0–44)
AST: 15 U/L (ref 15–41)
Albumin: 4.3 g/dL (ref 3.5–5.0)
Alkaline Phosphatase: 140 U/L — ABNORMAL HIGH (ref 38–126)
Anion gap: 5 (ref 5–15)
BUN: 11 mg/dL (ref 8–23)
CO2: 29 mmol/L (ref 22–32)
Calcium: 10 mg/dL (ref 8.9–10.3)
Chloride: 105 mmol/L (ref 98–111)
Creatinine: 0.8 mg/dL (ref 0.61–1.24)
GFR, Estimated: >60 mL/min (ref >60)
Glucose, Bld: 133 mg/dL — ABNORMAL HIGH (ref 70–99)
Potassium: 4 mmol/L (ref 3.5–5.1)
Sodium: 139 mmol/L (ref 135–145)
Total Bilirubin: 0.6 mg/dL (ref 0.3–1.2)
Total Protein: 7.2 g/dL (ref 6.5–8.1)

## 2021-11-22 LAB — TSH: TSH: 1.611 u[IU]/mL (ref 0.350–4.500)

## 2021-11-22 MED ORDER — SODIUM CHLORIDE 0.9 % IV SOLN: Freq: Once | INTRAVENOUS | Status: AC

## 2021-11-22 MED ORDER — SODIUM CHLORIDE 0.9 % IV SOLN
350.0000 mg | Freq: Once | INTRAVENOUS | Status: AC
  Administered 2021-11-22: 350 mg via INTRAVENOUS
  Filled 2021-11-22: qty 7

## 2021-11-22 NOTE — Patient Instructions (Signed)
[facility]  Discharge Instructions: Thank you for choosing [facility] to provide your oncology and hematology care.   If you have a lab appointment with the [facility], please go directly to the [facility] and check in at the registration area.   Wear comfortable clothing and clothing appropriate for easy access to any Portacath or PICC line.   We strive to give you quality time with your provider. You may need to reschedule your appointment if you arrive late (15 or more minutes).  Arriving late affects you and other patients whose appointments are after yours.  Also, if you miss three or more appointments without notifying the office, you may be dismissed from the clinic at the provider's discretion.      For prescription refill requests, have your pharmacy contact our office and allow 72 hours for refills to be completed.    Today you received the following chemotherapy and/or immunotherapy agents :  Libtayo      To help prevent nausea and vomiting after your treatment, we encourage you to take your nausea medication as directed.  BELOW ARE SYMPTOMS THAT SHOULD BE REPORTED IMMEDIATELY: *FEVER GREATER THAN 100.4 F (38 C) OR HIGHER *CHILLS OR SWEATING *NAUSEA AND VOMITING THAT IS NOT CONTROLLED WITH YOUR NAUSEA MEDICATION *UNUSUAL SHORTNESS OF BREATH *UNUSUAL BRUISING OR BLEEDING *URINARY PROBLEMS (pain or burning when urinating, or frequent urination) *BOWEL PROBLEMS (unusual diarrhea, constipation, pain near the anus) TENDERNESS IN MOUTH AND THROAT WITH OR WITHOUT PRESENCE OF ULCERS (sore throat, sores in mouth, or a toothache) UNUSUAL RASH, SWELLING OR PAIN  UNUSUAL VAGINAL DISCHARGE OR ITCHING   Items with * indicate a potential emergency and should be followed up as soon as possible or go to the Emergency Department if any problems should occur.  Please show the CHEMOTHERAPY ALERT CARD or IMMUNOTHERAPY ALERT CARD at check-in to  the Emergency Department and triage nurse.  Should you have questions after your visit or need to cancel or reschedule your appointment, please contact [facility]  Dept: [phone]  and follow the prompts.  Office hours are 8:00 a.m. to 4:30 p.m. Monday - Friday. Please note that voicemails left after 4:00 p.m. may not be returned until the following business day.  We are closed weekends and major holidays. You have access to a nurse at all times for urgent questions. Please call the main number to the clinic Dept: [phone] and follow the prompts.   For any non-urgent questions, you may also contact your provider using MyChart. We now offer e-Visits for anyone 18 and older to request care online for non-urgent symptoms. For details visit [email or web address].   Also download the MyChart app! Go to the app store, search "MyChart", open the app, select [facility], and log in with your MyChart username and password.  Due to Covid, a mask is required upon entering the hospital/clinic. If you do not have a mask, one will be given to you upon arrival. For doctor visits, patients may have 1 support person aged 18 or older with them. For treatment visits, patients cannot have anyone with them due to current Covid guidelines and our immunocompromised population.   

## 2021-11-22 NOTE — Progress Notes (Signed)
Hematology and Oncology Follow Up Visit  [facility] [id number] 1940-08-18 81 y.o. 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  He developed stage IV with lung involvement noted in January 2023.     Prior Therapy:  He is status post parotidectomy with skin resection and rotational flap completed by Dr. Constance Holster in April 2022.  Final pathology showed squamous cell carcinoma.  He is status post radiation therapy under the care of Dr. Isidore Moos completing 33 fractions on December 19, 2020 for a total of 66 Gray   Current therapy: Libtayo 350 mg started on August 10, 2021.  He is here for cycle 6 of therapy.  Interim History: Mr. Hudler is here for a follow-up visit.  Since last visit, he reports no major changes in his health.  He denies any nausea, vomiting or abdominal pain.  He denies any chest pain or shortness of breath.  He denies any wheezing or hemoptysis.  He does report some occasional cough related to postnasal drip.     Medications: Updated on review. Current Outpatient Medications  Medication Sig Dispense Refill   acetaminophen (TYLENOL) 500 MG tablet Take 1,000 mg by mouth every 8 (eight) hours as needed for moderate pain.     amLODipine (NORVASC) 10 MG tablet TAKE 1 TABLET BY MOUTH  DAILY 90 tablet 3   aspirin EC 81 MG tablet Take 1 tablet (81 mg total) by mouth daily. Swallow whole. 30 tablet 11   atorvastatin (LIPITOR) 40 MG tablet TAKE 1 TABLET BY MOUTH  DAILY 90 tablet 3   Cholecalciferol (VITAMIN D PO) Take 5,000 Units by mouth daily.     gabapentin (NEURONTIN) 300 MG capsule TAKE 2 CAPSULES BY MOUTH 3 TIMES DAILY. 180 capsule 1   latanoprost (XALATAN) 0.005 % ophthalmic solution Place 1 drop into both eyes every morning.     LORazepam (ATIVAN) 1 MG tablet Take 1 tablet (1 mg total) by mouth 3 (three) times daily as needed for anxiety. 60 tablet 5    Melatonin 5 MG TABS Take 5 mg by mouth at bedtime.     metFORMIN (GLUCOPHAGE) 500 MG tablet TAKE 1 TABLET BY MOUTH  TWICE DAILY WITH A MEAL 180 tablet 3   Multiple Vitamin (MULTIVITAMIN) tablet Take 1 tablet by mouth daily.     prochlorperazine (COMPAZINE) 10 MG tablet Take 1 tablet (10 mg total) by mouth every 6 (six) hours as needed for nausea or vomiting. 30 tablet 0   ramipril (ALTACE) 10 MG capsule TAKE 1 CAPSULE BY MOUTH  TWICE DAILY 180 capsule 3   timolol (BETIMOL) 0.5 % ophthalmic solution Place 1 drop into both eyes daily.     No current facility-administered medications for this visit.     Allergies: No Known Allergies    Physical Exam:  Blood pressure (!) 152/75, pulse 65, temperature 97.7 F (36.5 C), temperature source Oral, resp. rate 18, height '5\' 10"'$  (1.778 m), weight 141 lb 9.6 oz (64.2 kg), SpO2 96 %.     ECOG: 0    General appearance: Alert, awake without any distress. Head: Atraumatic without abnormalities Oropharynx: Without any thrush or ulcers. Eyes: No scleral icterus. Lymph nodes: No lymphadenopathy noted in the cervical, supraclavicular, or axillary nodes Heart:regular rate and rhythm, without any murmurs or gallops.   Lung: Clear to auscultation without any rhonchi, wheezes or dullness to percussion. Abdomin: Soft, nontender without any  shifting dullness or ascites. Musculoskeletal: No clubbing or cyanosis. Neurological: No motor or sensory deficits. Skin: No rashes or lesions.         Lab Results: Lab Results  Component Value Date   WBC 7.7 11/02/2021   HGB 13.0 11/02/2021   HCT 37.3 (L) 11/02/2021   MCV 86.7 11/02/2021   PLT 226 11/02/2021   PSA 1.54 09/05/2021     Chemistry      Component Value Date/Time   NA 139 11/02/2021 0750   K 3.6 11/02/2021 0750   CL 102 11/02/2021 0750   CO2 29 11/02/2021 0750   BUN 14 11/02/2021 0750   CREATININE 0.93 11/02/2021 0750   CREATININE 0.69 (L) 01/08/2021 1553      Component Value  Date/Time   CALCIUM 9.4 11/02/2021 0750   ALKPHOS 123 11/02/2021 0750   AST 12 (L) 11/02/2021 0750   ALT 9 11/02/2021 0750   BILITOT 0.8 11/02/2021 0750       Impression and Plan:  81 year old man with:  1.  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   He is currently on Libtayo without any major complications.  Risks and benefits of continuing this treatment were discussed at this time.  Potential complications that include autoimmune issues, GI toxicity among others were reiterated.  Alternative treatment choices including systemic chemotherapy with cis-platinum versus Botox were reiterated.  Plan is to update his staging scan in August 2023.   2.  IV access: Peripheral veins are currently in use without any issues.   3.  Antiemetics: Compazine is available to him without any nausea.   4.  Autoimmune complications: I continue to educate him about these complications including pneumonitis, colitis, thyroid disease as well as dermatitis.    5.  Follow-up: In 3 weeks for the next cycle of therapy.   30  minutes were dedicated to this visit.  Time spent on reviewing his disease status, treatment choices and future plan of care review.  Zola Button, MD 6/8/202311:25 AM

## 2021-12-03 ENCOUNTER — Telehealth: Payer: Self-pay

## 2021-12-03 NOTE — Telephone Encounter (Signed)
T/C from pt stating he is having swelling in the right side of his face from nose to cheek. His wife noticed it this afternoon.  No pain, shortness of breath, fever and no swelling in his eye.  He is asking to be seen tomorrow.  Please advise.    Pt advised if any other symptoms develop tonight he will need to go to the ED.

## 2021-12-04 ENCOUNTER — Inpatient Hospital Stay (HOSPITAL_BASED_OUTPATIENT_CLINIC_OR_DEPARTMENT_OTHER): Payer: Medicare Other | Admitting: Physician Assistant

## 2021-12-04 ENCOUNTER — Telehealth: Payer: Self-pay

## 2021-12-04 ENCOUNTER — Inpatient Hospital Stay: Payer: Medicare Other

## 2021-12-04 ENCOUNTER — Other Ambulatory Visit: Payer: Self-pay

## 2021-12-04 VITALS — BP 164/85 | HR 69 | Temp 97.9°F | Resp 18 | Wt 141.5 lb

## 2021-12-04 DIAGNOSIS — C44329 Squamous cell carcinoma of skin of other parts of face: Secondary | ICD-10-CM | POA: Diagnosis not present

## 2021-12-04 DIAGNOSIS — R220 Localized swelling, mass and lump, head: Secondary | ICD-10-CM

## 2021-12-04 DIAGNOSIS — C7989 Secondary malignant neoplasm of other specified sites: Secondary | ICD-10-CM | POA: Diagnosis not present

## 2021-12-04 DIAGNOSIS — C7800 Secondary malignant neoplasm of unspecified lung: Secondary | ICD-10-CM | POA: Diagnosis not present

## 2021-12-04 DIAGNOSIS — Z5112 Encounter for antineoplastic immunotherapy: Secondary | ICD-10-CM | POA: Diagnosis not present

## 2021-12-04 DIAGNOSIS — Z79899 Other long term (current) drug therapy: Secondary | ICD-10-CM | POA: Diagnosis not present

## 2021-12-04 LAB — CBC WITH DIFFERENTIAL (CANCER CENTER ONLY)
Abs Immature Granulocytes: 0.02 10*3/uL (ref 0.00–0.07)
Basophils Absolute: 0 10*3/uL (ref 0.0–0.1)
Basophils Relative: 0 %
Eosinophils Absolute: 0.7 10*3/uL — ABNORMAL HIGH (ref 0.0–0.5)
Eosinophils Relative: 10 %
HCT: 39.8 % (ref 39.0–52.0)
Hemoglobin: 13.7 g/dL (ref 13.0–17.0)
Immature Granulocytes: 0 %
Lymphocytes Relative: 15 %
Lymphs Abs: 1 10*3/uL (ref 0.7–4.0)
MCH: 29.7 pg (ref 26.0–34.0)
MCHC: 34.4 g/dL (ref 30.0–36.0)
MCV: 86.1 fL (ref 80.0–100.0)
Monocytes Absolute: 0.5 10*3/uL (ref 0.1–1.0)
Monocytes Relative: 7 %
Neutro Abs: 4.5 10*3/uL (ref 1.7–7.7)
Neutrophils Relative %: 68 %
Platelet Count: 242 10*3/uL (ref 150–400)
RBC: 4.62 MIL/uL (ref 4.22–5.81)
RDW: 13.2 % (ref 11.5–15.5)
WBC Count: 6.6 10*3/uL (ref 4.0–10.5)
nRBC: 0 % (ref 0.0–0.2)

## 2021-12-04 LAB — CMP (CANCER CENTER ONLY)
ALT: 11 U/L (ref 0–44)
AST: 14 U/L — ABNORMAL LOW (ref 15–41)
Albumin: 4.1 g/dL (ref 3.5–5.0)
Alkaline Phosphatase: 123 U/L (ref 38–126)
Anion gap: 6 (ref 5–15)
BUN: 17 mg/dL (ref 8–23)
CO2: 29 mmol/L (ref 22–32)
Calcium: 10.2 mg/dL (ref 8.9–10.3)
Chloride: 103 mmol/L (ref 98–111)
Creatinine: 0.96 mg/dL (ref 0.61–1.24)
GFR, Estimated: >60 mL/min (ref >60)
Glucose, Bld: 176 mg/dL — ABNORMAL HIGH (ref 70–99)
Potassium: 3.8 mmol/L (ref 3.5–5.1)
Sodium: 138 mmol/L (ref 135–145)
Total Bilirubin: 0.6 mg/dL (ref 0.3–1.2)
Total Protein: 7 g/dL (ref 6.5–8.1)

## 2021-12-04 LAB — TSH: TSH: 1.647 u[IU]/mL (ref 0.350–4.500)

## 2021-12-04 NOTE — Telephone Encounter (Signed)
Pt has been scheduled for today at 11:00 lab and 11:30 [facility].

## 2021-12-04 NOTE — Telephone Encounter (Signed)
Unsuccessful attempt to reach patient on preferred number listed in notes for scheduled AWV. Left message on voicemail okay to reschedule. 

## 2021-12-04 NOTE — Progress Notes (Unsigned)
Symptom Management Consult note [facility]    Patient Care Team: [name], MD as PCP - General (Family Medicine) Malmfelt, Stephani Police, RN as Oncology Nurse Navigator Eppie Gibson, MD as Consulting Physician (Radiation Oncology) Charlaine Dalton, DMD as Consulting Physician (Dentistry) Izora Gala, MD as Consulting Physician (Otolaryngology) Viona Gilmore, [facility] as Pharmacist (Pharmacist) Alda Berthold, DO as Consulting Physician (Neurology)    Name of the patient: [facility]  [id number]  09-Sep-1940   Date of visit: 12/04/2021    Chief complaint/ Reason for visit- right facial swelling  Oncology History  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)  10/02/2020 Initial Diagnosis   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[place])   10/02/2020 Cancer Staging   Staging form: Major Salivary Glands, AJCC 8th Edition - Pathologic stage from 10/02/2020: Stage Unknown (pT3, pNX) - Signed by Eppie Gibson, MD on 10/02/2020 Stage prefix: Initial diagnosis   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 with oncologic history as above presenting to [facility] today with chief complaint of right facial swelling x 2 days. Patient's spouse accompanies him and provides additional history.  Spouse states that she noticed yesterday patient had right-sided facial swelling.  She first noticed the right side of his lips were swollen.  As the day went on he had some swelling to his right cheek and underneath his right eye.  Patient states there was a lump on his cheek yesterday as well.  He denies any associated pain.  He took Claritin last night and when he woke up this  morning the swelling had improved and the lump had resolved.  He denies any associated dental pain.  Patient denies any new food, lotion, detergent or medications.  He denies any injury or facial trauma.  Patient denies any weight loss, fever, chills, shortness of breath, wheezing, chest pain, abdominal pain, nausea, vomiting, diarrhea.  His denies any swelling or side effects after recent Libtayo infusions.      ROS  All other systems are reviewed and are negative for acute change except as noted in the HPI.    No Known Allergies   Past Medical History:  Diagnosis Date   Arthritis    Diabetes mellitus type II    Glaucoma    sees Dr. 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 Watt Climes, benign polyps, repeat in 5 yrs   left craniotomy  11/03/08   per Dr. Sherley Bounds for a subdrual hematoma   PAROTIDECTOMY Left 09/20/2020   Procedure: PAROTIDECTOMY with resection of skin and rotational flap;  Surgeon: Izora Gala, MD;  Location: [facility];  Service: ENT;  Laterality: Left;   surgery for ocmpound fracture     right leg  Social History   Socioeconomic History   Marital status: Married    Spouse name: Jana Half   Number of children: 3   Years of education: Not on file   Highest education level: Bachelor's degree (e.g., BA, AB, BS)  Occupational History   Not on file  Tobacco Use   Smoking status: Never   Smokeless tobacco: Never  Vaping Use   Vaping Use: Never used  Substance and Sexual Activity   Alcohol use: Yes    Alcohol/week: 1.0 standard drink of alcohol    Types: 1 Standard drinks or equivalent per week   Drug use: Not on file   Sexual activity: Not on file  Other Topics Concern    Not on file  Social History Narrative   Right Handed    Lives in a one story home   Has large support system   Independent with ADLs   Social Determinants of Health   Financial Resource Strain: Low Risk  (05/08/2021)   Overall Financial Resource Strain (CARDIA)    Difficulty of Paying Living Expenses: Not hard at all  Food Insecurity: No Food Insecurity (05/08/2021)   Hunger Vital Sign    Worried About Running Out of Food in the Last Year: Never true    Ran Out of Food in the Last Year: Never true  Transportation Needs: No Transportation Needs (05/08/2021)   PRAPARE - Hydrologist (Medical): No    Lack of Transportation (Non-Medical): No  Physical Activity: Insufficiently Active (05/08/2021)   Exercise Vital Sign    Days of Exercise per Week: 2 days    Minutes of Exercise per Session: 30 min  Stress: No Stress Concern Present (05/08/2021)   [place]    Feeling of Stress : Not at all  Social Connections: [place] (05/08/2021)   Social Connection and Isolation Panel [NHANES]    Frequency of Communication with Friends and Family: Twice a week    Frequency of Social Gatherings with Friends and Family: Twice a week    Attends Religious Services: More than 4 times per year    Active Member of [employer] or Organizations: Yes    Attends Music therapist: More than 4 times per year    Marital Status: Married  Human resources officer Violence: Not At Risk (04/13/2020)   Humiliation, Afraid, Rape, and Kick questionnaire    Fear of Current or Ex-Partner: No    Emotionally Abused: No    Physically Abused: No    Sexually Abused: No    Family History  Problem Relation Age of Onset   Cancer Mother    Heart disease Other      Current Outpatient Medications:    acetaminophen (TYLENOL) 500 MG tablet, Take 1,000 mg by mouth every 8 (eight) hours as needed for moderate pain.,  Disp: , Rfl:    amLODipine (NORVASC) 10 MG tablet, TAKE 1 TABLET BY MOUTH  DAILY, Disp: 90 tablet, Rfl: 3   aspirin EC 81 MG tablet, Take 1 tablet (81 mg total) by mouth daily. Swallow whole., Disp: 30 tablet, Rfl: 11   atorvastatin (LIPITOR) 40 MG tablet, TAKE 1 TABLET BY MOUTH  DAILY, Disp: 90 tablet, Rfl: 3   Cholecalciferol (VITAMIN D PO), Take 5,000 Units by mouth daily., Disp: , Rfl:    gabapentin (NEURONTIN) 300 MG capsule, TAKE 2 CAPSULES BY MOUTH 3 TIMES DAILY., Disp: 180 capsule, Rfl: 1   latanoprost (XALATAN) 0.005 %  ophthalmic solution, Place 1 drop into both eyes every morning., Disp: , Rfl:    LORazepam (ATIVAN) 1 MG tablet, Take 1 tablet (1 mg total) by mouth 3 (three) times daily as needed for anxiety., Disp: 60 tablet, Rfl: 5   Melatonin 5 MG TABS, Take 5 mg by mouth at bedtime., Disp: , Rfl:    metFORMIN (GLUCOPHAGE) 500 MG tablet, TAKE 1 TABLET BY MOUTH  TWICE DAILY WITH A MEAL, Disp: 180 tablet, Rfl: 3   Multiple Vitamin (MULTIVITAMIN) tablet, Take 1 tablet by mouth daily., Disp: , Rfl:    prochlorperazine (COMPAZINE) 10 MG tablet, Take 1 tablet (10 mg total) by mouth every 6 (six) hours as needed for nausea or vomiting., Disp: 30 tablet, Rfl: 0   ramipril (ALTACE) 10 MG capsule, TAKE 1 CAPSULE BY MOUTH  TWICE DAILY, Disp: 180 capsule, Rfl: 3   timolol (BETIMOL) 0.5 % ophthalmic solution, Place 1 drop into both eyes daily., Disp: , Rfl:   PHYSICAL EXAM: ECOG FS:1 - Symptomatic but completely ambulatory    Vitals:   12/04/21 1146  BP: (!) 164/85  Pulse: 69  Resp: 18  Temp: 97.9 F (36.6 C)  TempSrc: Oral  SpO2: 98%  Weight: 141 lb 8 oz (64.2 kg)   Physical Exam Vitals and nursing note reviewed.  Constitutional:      Appearance: He is well-developed. He is not ill-appearing or toxic-appearing.  HENT:     Head: Normocephalic and atraumatic.     Comments: Right sided facial swelling of right cheek and lips. Please see media below.     Nose: Nose normal.      Mouth/Throat:     Comments: Tongue with black coating. No swelling of tongue. No dental abscess or infection appreciated. Eyes:     General: No scleral icterus.       Right eye: No discharge.        Left eye: No discharge.     Conjunctiva/sclera: Conjunctivae normal.  Neck:     Vascular: No JVD.  Cardiovascular:     Rate and Rhythm: Normal rate and regular rhythm.     Pulses: Normal pulses.     Heart sounds: Normal heart sounds.  Pulmonary:     Effort: Pulmonary effort is normal. No respiratory distress.     Breath sounds: Normal breath sounds. No stridor. No wheezing, rhonchi or rales.  Abdominal:     General: There is no distension.  Musculoskeletal:        General: Normal range of motion.     Cervical back: Normal range of motion.  Lymphadenopathy:     Cervical: No cervical adenopathy.     Right cervical: No superficial cervical adenopathy. Skin:    General: Skin is warm and dry.  Neurological:     Mental Status: He is oriented to person, place, and time.     GCS: GCS eye subscore is 4. GCS verbal subscore is 5. GCS motor subscore is 6.     Comments: Fluent speech, no facial droop.  Psychiatric:        Behavior: Behavior normal.         LABORATORY DATA: I have reviewed the data as listed    Latest Ref Rng & Units 12/04/2021   10:49 AM 11/22/2021   11:22 AM 11/02/2021    7:50 AM  CBC  WBC 4.0 - 10.5 K/uL 6.6  6.9  7.7   Hemoglobin 13.0 - 17.0 g/dL 13.7  14.2  13.0   Hematocrit 39.0 -  52.0 % 39.8  41.3  37.3   Platelets 150 - 400 K/uL 242  252  226         Latest Ref Rng & Units 12/04/2021   10:49 AM 11/22/2021   11:22 AM 11/02/2021    7:50 AM  CMP  Glucose 70 - 99 mg/dL 176  133  220   BUN 8 - 23 mg/dL '17  11  14   '$ Creatinine 0.61 - 1.24 mg/dL 0.96  0.80  0.93   Sodium 135 - 145 mmol/L 138  139  139   Potassium 3.5 - 5.1 mmol/L 3.8  4.0  3.6   Chloride 98 - 111 mmol/L 103  105  102   CO2 22 - 32 mmol/L '29  29  29   '$ Calcium 8.9 - 10.3 mg/dL 10.2  10.0  9.4    Total Protein 6.5 - 8.1 g/dL 7.0  7.2  6.9   Total Bilirubin 0.3 - 1.2 mg/dL 0.6  0.6  0.8   Alkaline Phos 38 - 126 U/L 123  140  123   AST 15 - 41 U/L '14  15  12   '$ ALT 0 - 44 U/L '11  11  9        '$ RADIOGRAPHIC STUDIES: I have personally reviewed the radiological images as listed and agreed with the findings in the report.  No results found.   ASSESSMENT & PLAN: Patient is a 81 y.o. 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 Alen Blew.  I have viewed most recent oncology note and lab work.   #)Facial swelling- Patient is nontoxic appearing, HDS. He has minimal right facial swelling. No signs of dental infection on exam. No signs of anaphylaxis on exam. Swelling is suggestive of allergic reaction. His last treatment was x 1.5 weeks ago and he has tolerated previous without complications, so less likely to be treatment related.  Encouraged patient to continue Claritin x 1 week and to closely monitor the swelling. Patient is diabetic and on immunotherapy so not a candidate for steroid treatment. I viewed labs from today which include CBC and CMP which are overall unremarkable.    Strict ED precautions discussed should symptoms worsen.   Shared encounter with Dr. Alen Blew.  Visit Diagnosis: 1. Right facial swelling   2. 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   All questions were answered. The patient knows to call the clinic with any problems, questions or concerns. No barriers to learning was detected.  I have spent a total of 20 minutes minutes of face-to-face and non-face-to-face time, preparing to see the patient, obtaining and/or reviewing separately obtained history, performing a medically appropriate examination, counseling and educating the patient, ordering tests,  documenting clinical information in the electronic  health record, and care coordination.     Thank you for allowing me to participate in the care of this patient.    Barrie Folk, PA-C Department of Hematology/Oncology [facility] at [facility] Phone: [phone]  [phone]    12/04/2021 3:43 PM   Patient seen and examined and agree with note above.  I do not see any evidence of tumor or malignancy on exam.  His facial swelling appears to be improving.  I recommended continued supportive management and use of loratadine now for complete week.  I see no need for steroid use at this time.  We will continue to follow closely.  Zola Button MD 12/05/2021

## 2021-12-05 ENCOUNTER — Encounter: Payer: Self-pay | Admitting: Oncology

## 2021-12-10 ENCOUNTER — Telehealth: Payer: Self-pay | Admitting: Family Medicine

## 2021-12-11 ENCOUNTER — Ambulatory Visit (INDEPENDENT_AMBULATORY_CARE_PROVIDER_SITE_OTHER): Payer: Medicare Other

## 2021-12-11 VITALS — Ht 70.0 in | Wt 141.0 lb

## 2021-12-11 DIAGNOSIS — Z0000 Encounter for general adult medical examination without abnormal findings: Secondary | ICD-10-CM | POA: Diagnosis not present

## 2021-12-11 NOTE — Progress Notes (Signed)
Subjective:   Shaun Mcmillan is a 81 y.o. male who presents for Medicare Annual/Subsequent preventive examination.  Review of Systems    Virtual Visit via Telephone Note  I connected with  Shaun Mcmillan on 12/11/21 at  9:30 AM EDT by telephone and verified that I am speaking with the correct person using two identifiers.  Location: Patient: Home Provider: Office Persons participating in the virtual visit: patient/Nurse Health Advisor   I discussed the limitations, risks, security and privacy concerns of performing an evaluation and management service by telephone and the availability of in person appointments. The patient expressed understanding and agreed to proceed.  Interactive audio and video telecommunications were attempted between this nurse and patient, however failed, due to patient having technical difficulties OR patient did not have access to video capability.  We continued and completed visit with audio only.  Some vital signs may be absent or patient reported.   Shaun Peaches, LPN  Cardiac Risk Factors include: advanced age (>65mn, >>61women);diabetes mellitus;hypertension;male gender     Objective:    Today's Vitals   12/11/21 0936 12/11/21 0937  Weight: 141 lb (64 kg)   Height: '5\' 10"'$  (1.778 m)   PainSc:  0-No pain   Body mass index is 20.23 kg/m.     12/11/2021    9:49 AM 11/07/2021    4:03 PM 07/11/2021    9:38 AM 03/20/2021    2:42 PM 01/03/2021    4:31 PM 10/02/2020    7:51 AM 09/20/2020   12:05 PM  Advanced Directives  Does Patient Have a Medical Advance Directive? Yes No Yes No No Yes Yes  Type of AParamedicof [place]addle RidgeLiving will  [place] will;Out of facility DNR (pink MOST or yellow form)   [place] will [place] will  Does patient want to make changes to medical advance directive? No - Patient declined     No - Patient declined No -  Patient declined  Copy of [place] Chart? No - copy requested     No - copy requested No - copy requested  Would patient like information on creating a medical advance directive?  No - Patient declined  No - Patient declined No - Patient declined      Current Medications (verified) Outpatient Encounter Medications as of 12/11/2021  Medication Sig   acetaminophen (TYLENOL) 500 MG tablet Take 1,000 mg by mouth every 8 (eight) hours as needed for moderate pain.   amLODipine (NORVASC) 10 MG tablet TAKE 1 TABLET BY MOUTH  DAILY   aspirin EC 81 MG tablet Take 1 tablet (81 mg total) by mouth daily. Swallow whole.   atorvastatin (LIPITOR) 40 MG tablet TAKE 1 TABLET BY MOUTH  DAILY   Cholecalciferol (VITAMIN D PO) Take 5,000 Units by mouth daily.   gabapentin (NEURONTIN) 300 MG capsule TAKE 2 CAPSULES BY MOUTH 3 TIMES DAILY.   latanoprost (XALATAN) 0.005 % ophthalmic solution Place 1 drop into both eyes every morning.   LORazepam (ATIVAN) 1 MG tablet Take 1 tablet (1 mg total) by mouth 3 (three) times daily as needed for anxiety.   Melatonin 5 MG TABS Take 5 mg by mouth at bedtime.   metFORMIN (GLUCOPHAGE) 500 MG tablet TAKE 1 TABLET BY MOUTH  TWICE DAILY WITH A MEAL   Multiple Vitamin (MULTIVITAMIN) tablet Take 1 tablet by mouth daily.   prochlorperazine (COMPAZINE) 10 MG tablet Take 1 tablet (10  mg total) by mouth every 6 (six) hours as needed for nausea or vomiting.   ramipril (ALTACE) 10 MG capsule TAKE 1 CAPSULE BY MOUTH  TWICE DAILY   timolol (BETIMOL) 0.5 % ophthalmic solution Place 1 drop into both eyes daily.   No facility-administered encounter medications on file as of 12/11/2021.    Allergies (verified) Patient has no known allergies.   History: Past Medical History:  Diagnosis Date   Arthritis    Diabetes mellitus type II    Glaucoma    sees Dr. 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 Watt Climes, benign polyps, repeat in 5 yrs   left craniotomy  11/03/08   per Dr. Sherley Bounds for a subdrual hematoma   PAROTIDECTOMY Left 09/20/2020   Procedure: PAROTIDECTOMY with resection of skin and rotational flap;  Surgeon: Izora Gala, MD;  Location: [place];  Service: ENT;  Laterality: Left;   surgery for ocmpound fracture     right leg   Family History  Problem Relation Age of Onset   Cancer Mother    Heart disease Other    Social History   Socioeconomic History   Marital status: Married    Spouse name: Shaun Mcmillan   Number of children: 3   Years of education: Not on file   Highest education level: Bachelor's degree (e.g., BA, AB, BS)  Occupational History   Not on file  Tobacco Use   Smoking status: Never   Smokeless tobacco: Never  Vaping Use   Vaping Use: Never used  Substance and Sexual Activity   Alcohol use: Yes    Alcohol/week: 1.0 standard drink of alcohol    Types: 1 Standard drinks or equivalent per week   Drug use: Not on file   Sexual activity: Not on file  Other Topics Concern   Not on file  Social History Narrative   Right Handed    Lives in a one story home   Has large support system   Independent with ADLs   Social Determinants of Health   Financial Resource Strain: Low Risk  (12/11/2021)   Overall Financial Resource Strain (CARDIA)    Difficulty of Paying Living Expenses: Not hard at all  Food Insecurity: No Food Insecurity (12/11/2021)   Hunger Vital Sign    Worried About Running Out of Food in the Last Year: Never true    Ran Out of Food in the Last Year: Never true  Transportation Needs: No Transportation Needs (12/11/2021)   PRAPARE - Hydrologist (Medical): No    Lack of Transportation  (Non-Medical): No  Physical Activity: Insufficiently Active (12/11/2021)   Exercise Vital Sign    Days of Exercise per Week: 3 days    Minutes of Exercise per Session: 40 min  Stress: No Stress Concern Present (12/11/2021)   [place]    Feeling of Stress : Not at all  Social Connections: [place] (12/11/2021)   Social Connection and Isolation Panel [NHANES]    Frequency of Communication with Friends and Family: More than  three times a week    Frequency of Social Gatherings with Friends and Family: More than three times a week    Attends Religious Services: More than 4 times per year    Active Member of [employer] or Organizations: Yes    Attends Music therapist: More than 4 times per year    Marital Status: Married     Clinical Intake: How often do you need to have someone help you when you read instructions, pamphlets, or other written materials from your doctor or pharmacy?: 1 - Never  Diabetic?  Pre Diabetic   Activities of Daily Living    12/11/2021    9:47 AM  In your present state of health, do you have any difficulty performing the following activities:  Hearing? 0  Vision? 0  Difficulty concentrating or making decisions? 0  Walking or climbing stairs? 0  Dressing or bathing? 0  Doing errands, shopping? 0  Preparing Food and eating ? N  Using the Toilet? N  In the past six months, have you accidently leaked urine? N  Do you have problems with loss of bowel control? N  Managing your Medications? N  Managing your Finances? N  Housekeeping or managing your Housekeeping? N    Patient Care Team: [name], MD as PCP - General (Family Medicine) Malmfelt, Stephani Police, RN as Oncology Nurse Navigator Eppie Gibson, MD as Consulting Physician (Radiation Oncology) Charlaine Dalton, DMD as Consulting Physician (Dentistry) Izora Gala, MD as Consulting Physician  (Otolaryngology) Viona Gilmore, [facility] as Pharmacist (Pharmacist) Alda Berthold, DO as Consulting Physician (Neurology)  Indicate any recent Medical Services you may have received from other than [facility] providers in the past year (date may be approximate).     Assessment:   This is a routine wellness examination for Ricke.  Hearing/Vision screen Hearing Screening - Comments:: Wears hearing aids Vision Screening - Comments:: Wears glasses. Followed by [place] issues and exercise activities discussed: Exercise limited by: None identified   Goals Addressed               This Visit's Progress     Patient Stated (pt-stated)        I will continue to try and walk about 3x per week.       Depression Screen    12/11/2021    9:45 AM 09/05/2021   11:47 AM 05/28/2021   11:57 AM 05/09/2021    3:05 PM 04/13/2020   10:15 AM 12/22/2018    9:02 AM 09/11/2016    1:54 PM  PHQ 2/9 Scores  PHQ - 2 Score 0 0 0 0 0 0 0  PHQ- 9 Score  0 0  0      Fall Risk    12/11/2021    9:47 AM 09/05/2021   11:45 AM 07/11/2021    9:36 AM 05/28/2021   11:57 AM 05/09/2021    3:05 PM  Fall Risk   Falls in the past year? 0 0 0 0 0  Number falls in past yr: 0 0 0 0   Injury with Fall? 0 0 0 0   Risk for fall due to : No Fall Risks No Fall Risks  No Fall Risks     FALL RISK PREVENTION PERTAINING TO THE HOME:  Any stairs in or around the home? No  If so, are there any without handrails? No  Home free of loose throw rugs in walkways, pet beds, electrical cords, etc?  Yes  Adequate lighting in your home to reduce risk of falls? Yes   ASSISTIVE DEVICES UTILIZED TO PREVENT FALLS:  Life alert? No  Use of a cane, walker or w/c? No  Grab bars in the bathroom? Yes Shower chair or bench in shower? No  Elevated toilet seat or a handicapped toilet? Yes   TIMED UP AND GO:  Was the test performed? No . Audio Visit  Cognitive Function:        12/11/2021    9:50 AM  6CIT Screen   What Year? 0 points  What month? 0 points  What time? 0 points  Count back from 20 0 points  Months in reverse 0 points  Repeat phrase 0 points  Total Score 0 points    Immunizations Immunization History  Administered Date(s) Administered   Fluad Quad(high Dose 65+) 03/02/2019, 03/21/2020, 03/02/2021   Influenza Split 04/03/2011, 03/20/2012   Influenza Whole 03/31/2007, 03/18/2008, 03/09/2010   Influenza, High Dose Seasonal PF 03/07/2015, 03/15/2016, 03/21/2017, 03/24/2018   Influenza,inj,Quad PF,6+ Mos 03/09/2013, 03/11/2014   PFIZER(Purple Top)SARS-COV-2 Vaccination 07/06/2019, 07/27/2019   Pfizer Covid-19 Vaccine Bivalent Booster 33yr & up 04/30/2021   Pneumococcal Conjugate-13 06/25/2013   Pneumococcal Polysaccharide-23 09/05/2015   Tdap 04/03/2011, 05/09/2021    TDAP status: Up to date  Flu Vaccine status: Up to date  Pneumococcal vaccine status: Up to date  Covid-19 vaccine status: Completed vaccines  Qualifies for Shingles Vaccine? No   Zostavax completed No   Shingrix Completed?: No.    Education has been provided regarding the importance of this vaccine. Patient has been advised to call insurance company to determine out of pocket expense if they have not yet received this vaccine. Advised may also receive vaccine at local pharmacy or Health Dept. Verbalized acceptance and understanding.  Screening Tests Health Maintenance  Topic Date Due   FOOT EXAM  Never done   OPHTHALMOLOGY EXAM  01/30/2017   COVID-19 Vaccine (4 - Booster for Pfizer series) 09/15/2022 (Originally 06/25/2021)   Zoster Vaccines- Shingrix (1 of 2) 09/15/2022 (Originally 11/25/1959)   INFLUENZA VACCINE  01/15/2022   HEMOGLOBIN A1C  03/08/2022   TETANUS/TDAP  05/10/2031   Pneumonia Vaccine 81 Years old  Completed   HPV VACCINES  Aged Out    Health Maintenance  Health Maintenance Due  Topic Date Due   FOOT EXAM  Never done   OPHTHALMOLOGY EXAM  01/30/2017      Lung Cancer Screening:  (Low Dose CT Chest recommended if Age 81-80years, 30 pack-year currently smoking OR have quit w/in 15years.) does not qualify.     Additional Screening:   Vision Screening: Recommended annual ophthalmology exams for early detection of glaucoma and other disorders of the eye. Is the patient up to date with their annual eye exam?  Yes  Who is the provider or what is the name of the office in which the patient attends annual eye exams? [facility] is not established with a provider, would they like to be referred to a provider to establish care? No .   Dental Screening: Recommended annual dental exams for proper oral hygiene  Community Resource Referral / Chronic Care Management:  CRR required this visit?  No   CCM required this visit?  No      Plan:     I have personally reviewed and noted the following in the patient's chart:   Medical and social history Use of alcohol, tobacco or illicit drugs  Current medications and supplements  including opioid prescriptions. Patient is not currently taking opioid prescriptions. Functional ability and status Nutritional status Physical activity Advanced directives List of other physicians Hospitalizations, surgeries, and ER visits in previous 12 months Vitals Screenings to include cognitive, depression, and falls Referrals and appointments  In addition, I have reviewed and discussed with patient certain preventive protocols, quality metrics, and best practice recommendations. A written personalized care plan for preventive services as well as general preventive health recommendations were provided to patient.     Shaun Peaches, LPN   [id number]   Nurse Notes: None I have read this note and agree with its contents.  Alysia Penna, MD

## 2021-12-14 ENCOUNTER — Inpatient Hospital Stay (HOSPITAL_BASED_OUTPATIENT_CLINIC_OR_DEPARTMENT_OTHER): Payer: Medicare Other | Admitting: Oncology

## 2021-12-14 ENCOUNTER — Inpatient Hospital Stay: Payer: Medicare Other

## 2021-12-14 ENCOUNTER — Other Ambulatory Visit: Payer: Self-pay

## 2021-12-14 VITALS — BP 152/64 | HR 65 | Temp 97.5°F | Resp 17 | Ht 70.0 in | Wt 143.7 lb

## 2021-12-14 VITALS — BP 144/65 | HR 57 | Temp 98.1°F | Resp 18

## 2021-12-14 DIAGNOSIS — C7989 Secondary malignant neoplasm of other specified sites: Secondary | ICD-10-CM

## 2021-12-14 DIAGNOSIS — Z5112 Encounter for antineoplastic immunotherapy: Secondary | ICD-10-CM | POA: Diagnosis not present

## 2021-12-14 DIAGNOSIS — C44329 Squamous cell carcinoma of skin of other parts of face: Secondary | ICD-10-CM | POA: Diagnosis not present

## 2021-12-14 DIAGNOSIS — R220 Localized swelling, mass and lump, head: Secondary | ICD-10-CM | POA: Diagnosis not present

## 2021-12-14 DIAGNOSIS — C7800 Secondary malignant neoplasm of unspecified lung: Secondary | ICD-10-CM | POA: Diagnosis not present

## 2021-12-14 DIAGNOSIS — Z79899 Other long term (current) drug therapy: Secondary | ICD-10-CM | POA: Diagnosis not present

## 2021-12-14 LAB — CBC WITH DIFFERENTIAL (CANCER CENTER ONLY)
Abs Immature Granulocytes: 0.02 10*3/uL (ref 0.00–0.07)
Basophils Absolute: 0 10*3/uL (ref 0.0–0.1)
Basophils Relative: 0 %
Eosinophils Absolute: 0.8 10*3/uL — ABNORMAL HIGH (ref 0.0–0.5)
Eosinophils Relative: 10 %
HCT: 37.6 % — ABNORMAL LOW (ref 39.0–52.0)
Hemoglobin: 13 g/dL (ref 13.0–17.0)
Immature Granulocytes: 0 %
Lymphocytes Relative: 10 %
Lymphs Abs: 0.9 10*3/uL (ref 0.7–4.0)
MCH: 29.9 pg (ref 26.0–34.0)
MCHC: 34.6 g/dL (ref 30.0–36.0)
MCV: 86.4 fL (ref 80.0–100.0)
Monocytes Absolute: 0.5 10*3/uL (ref 0.1–1.0)
Monocytes Relative: 6 %
Neutro Abs: 6.5 10*3/uL (ref 1.7–7.7)
Neutrophils Relative %: 74 %
Platelet Count: 236 10*3/uL (ref 150–400)
RBC: 4.35 MIL/uL (ref 4.22–5.81)
RDW: 13.4 % (ref 11.5–15.5)
WBC Count: 8.8 10*3/uL (ref 4.0–10.5)
nRBC: 0 % (ref 0.0–0.2)

## 2021-12-14 LAB — CMP (CANCER CENTER ONLY)
ALT: 12 U/L (ref 0–44)
AST: 14 U/L — ABNORMAL LOW (ref 15–41)
Albumin: 3.8 g/dL (ref 3.5–5.0)
Alkaline Phosphatase: 125 U/L (ref 38–126)
Anion gap: 6 (ref 5–15)
BUN: 13 mg/dL (ref 8–23)
CO2: 28 mmol/L (ref 22–32)
Calcium: 9.5 mg/dL (ref 8.9–10.3)
Chloride: 105 mmol/L (ref 98–111)
Creatinine: 0.77 mg/dL (ref 0.61–1.24)
GFR, Estimated: >60 mL/min (ref >60)
Glucose, Bld: 236 mg/dL — ABNORMAL HIGH (ref 70–99)
Potassium: 3.6 mmol/L (ref 3.5–5.1)
Sodium: 139 mmol/L (ref 135–145)
Total Bilirubin: 0.6 mg/dL (ref 0.3–1.2)
Total Protein: 6.5 g/dL (ref 6.5–8.1)

## 2021-12-14 LAB — TSH: TSH: 1.886 u[IU]/mL (ref 0.350–4.500)

## 2021-12-14 MED ORDER — SODIUM CHLORIDE 0.9 % IV SOLN
350.0000 mg | Freq: Once | INTRAVENOUS | Status: AC
  Administered 2021-12-14: 350 mg via INTRAVENOUS
  Filled 2021-12-14: qty 7

## 2021-12-14 MED ORDER — SODIUM CHLORIDE 0.9 % IV SOLN: Freq: Once | INTRAVENOUS | Status: AC

## 2021-12-14 NOTE — Progress Notes (Signed)
Hematology and Oncology Follow Up Visit  Shaun Mcmillan [id number] October 06, 1940 81 y.o. 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  He presented with localized disease in 2022.   Prior Therapy:  He is status post parotidectomy with skin resection and rotational flap completed by Dr. Constance Holster in April 2022.  Final pathology showed squamous cell carcinoma.  He is status post radiation therapy under the care of Dr. Isidore Moos completing 33 fractions on December 19, 2020 for a total of 66 [place]   Current therapy: Libtayo 350 mg started on August 10, 2021.  He is here for cycle 7 of therapy.  Interim History: [name] is here for repeat evaluation.  Since the last visit, he reports feeling well without any complaints.  He tolerated the current therapy without any issues.  He denies any nausea, vomiting or abdominal discomfort.  He denies any hospitalizations or illnesses.  He denies any skin rash or changes in his bowels.  His facial swelling has resolved at this time.     Medications: Reviewed without changes Current Outpatient Medications  Medication Sig Dispense Refill   acetaminophen (TYLENOL) 500 MG tablet Take 1,000 mg by mouth every 8 (eight) hours as needed for moderate pain.     amLODipine (NORVASC) 10 MG tablet TAKE 1 TABLET BY MOUTH  DAILY 90 tablet 3   aspirin EC 81 MG tablet Take 1 tablet (81 mg total) by mouth daily. Swallow whole. 30 tablet 11   atorvastatin (LIPITOR) 40 MG tablet TAKE 1 TABLET BY MOUTH  DAILY 90 tablet 3   Cholecalciferol (VITAMIN D PO) Take 5,000 Units by mouth daily.     gabapentin (NEURONTIN) 300 MG capsule TAKE 2 CAPSULES BY MOUTH 3 TIMES DAILY. 180 capsule 1   latanoprost (XALATAN) 0.005 % ophthalmic solution Place 1 drop into both eyes every morning.     LORazepam (ATIVAN) 1 MG tablet Take 1 tablet (1 mg total) by mouth 3 (three)  times daily as needed for anxiety. 60 tablet 5   Melatonin 5 MG TABS Take 5 mg by mouth at bedtime.     metFORMIN (GLUCOPHAGE) 500 MG tablet TAKE 1 TABLET BY MOUTH  TWICE DAILY WITH A MEAL 180 tablet 3   Multiple Vitamin (MULTIVITAMIN) tablet Take 1 tablet by mouth daily.     prochlorperazine (COMPAZINE) 10 MG tablet Take 1 tablet (10 mg total) by mouth every 6 (six) hours as needed for nausea or vomiting. 30 tablet 0   ramipril (ALTACE) 10 MG capsule TAKE 1 CAPSULE BY MOUTH  TWICE DAILY 180 capsule 3   timolol (BETIMOL) 0.5 % ophthalmic solution Place 1 drop into both eyes daily.     No current facility-administered medications for this visit.     Allergies: No Known Allergies    Physical Exam:    Blood pressure (!) 152/64, pulse 65, temperature (!) 97.5 F (36.4 C), temperature source Oral, resp. rate 17, height '5\' 10"'$  (1.778 m), weight 143 lb 11.2 oz (65.2 kg), SpO2 96 %.    ECOG: 0   General appearance: Comfortable appearing without any discomfort Head: Normocephalic without any trauma Oropharynx: Mucous membranes are moist and pink without any thrush or ulcers. Eyes: Pupils are equal and round reactive to light. Lymph nodes: No cervical, supraclavicular, inguinal or axillary lymphadenopathy.   Heart:regular rate and rhythm.  S1 and S2 without leg edema. Lung: Clear  without any rhonchi or wheezes.  No dullness to percussion. Abdomin: Soft, nontender, nondistended with good bowel sounds.  No hepatosplenomegaly. Musculoskeletal: No joint deformity or effusion.  Full range of motion noted. Neurological: No deficits noted on motor, sensory and deep tendon reflex exam. Skin: No petechial rash or dryness.  Appeared moist.           Lab Results: Lab Results  Component Value Date   WBC 6.6 12/04/2021   HGB 13.7 12/04/2021   HCT 39.8 12/04/2021   MCV 86.1 12/04/2021   PLT 242 12/04/2021   PSA 1.54 09/05/2021     Chemistry      Component Value Date/Time   NA 138  12/04/2021 1049   K 3.8 12/04/2021 1049   CL 103 12/04/2021 1049   CO2 29 12/04/2021 1049   BUN 17 12/04/2021 1049   CREATININE 0.96 12/04/2021 1049   CREATININE 0.69 (L) 01/08/2021 1553      Component Value Date/Time   CALCIUM 10.2 12/04/2021 1049   ALKPHOS 123 12/04/2021 1049   AST 14 (L) 12/04/2021 1049   ALT 11 12/04/2021 1049   BILITOT 0.6 12/04/2021 1049       Impression and Plan:  81 year old man with:  1.  Squamous cell carcinoma of the skin diagnosed in 2022.  He developed stage IV disease with pulmonary involvement.   The natural course of this disease was reviewed at this time and treatment choices were discussed.  He is currently on Libtayo which she has tolerated very well with a CT scan in May 2023 shows overall minimal changes.  I recommended updating his staging scan after the next cycle of therapy and will determine best course of action.  Different salvage therapy options including Erbitux and platinum based therapy were reviewed.  He is agreeable to proceed with this plan.   2.  IV access: No issues reported with peripheral veins at this time.   3.  Antiemetics: No nausea or vomiting reported at this time.  Compazine is available to him.   4.  Autoimmune complications: No complications noted at this time.  These include pneumonitis, colitis and thyroid disease.    5.  Follow-up: He will return in 3 weeks for a follow-up visit.  He will have repeat imaging studies in August and another follow-up after that.   30  minutes were spent on this encounter.  Time was dedicated to reviewing laboratory data, disease status update and outlining future plan of care discussion.  Zola Button, MD 6/30/20238:47 AM

## 2021-12-14 NOTE — Patient Instructions (Signed)
[facility]  Discharge Instructions: Thank you for choosing [facility] to provide your oncology and hematology care.   If you have a lab appointment with the Cancer Center, please go directly to the Cancer Center and check in at the registration area.   Wear comfortable clothing and clothing appropriate for easy access to any Portacath or PICC line.   We strive to give you quality time with your provider. You may need to reschedule your appointment if you arrive late (15 or more minutes).  Arriving late affects you and other patients whose appointments are after yours.  Also, if you miss three or more appointments without notifying the office, you may be dismissed from the clinic at the provider's discretion.      For prescription refill requests, have your pharmacy contact our office and allow 72 hours for refills to be completed.    Today you received the following chemotherapy and/or immunotherapy agents :  Libtayo      To help prevent nausea and vomiting after your treatment, we encourage you to take your nausea medication as directed.  BELOW ARE SYMPTOMS THAT SHOULD BE REPORTED IMMEDIATELY: *FEVER GREATER THAN 100.4 F (38 C) OR HIGHER *CHILLS OR SWEATING *NAUSEA AND VOMITING THAT IS NOT CONTROLLED WITH YOUR NAUSEA MEDICATION *UNUSUAL SHORTNESS OF BREATH *UNUSUAL BRUISING OR BLEEDING *URINARY PROBLEMS (pain or burning when urinating, or frequent urination) *BOWEL PROBLEMS (unusual diarrhea, constipation, pain near the anus) TENDERNESS IN MOUTH AND THROAT WITH OR WITHOUT PRESENCE OF ULCERS (sore throat, sores in mouth, or a toothache) UNUSUAL RASH, SWELLING OR PAIN  UNUSUAL VAGINAL DISCHARGE OR ITCHING   Items with * indicate a potential emergency and should be followed up as soon as possible or go to the Emergency Department if any problems should occur.  Please show the CHEMOTHERAPY ALERT CARD or IMMUNOTHERAPY ALERT CARD at check-in to  the Emergency Department and triage nurse.  Should you have questions after your visit or need to cancel or reschedule your appointment, please contact [facility]  Dept: [phone]  and follow the prompts.  Office hours are 8:00 a.m. to 4:30 p.m. Monday - Friday. Please note that voicemails left after 4:00 p.m. may not be returned until the following business day.  We are closed weekends and major holidays. You have access to a nurse at all times for urgent questions. Please call the main number to the clinic Dept: [phone] and follow the prompts.   For any non-urgent questions, you may also contact your provider using MyChart. We now offer e-Visits for anyone 18 and older to request care online for non-urgent symptoms. For details visit [email or web address].   Also download the MyChart app! Go to the app store, search "MyChart", open the app, select [facility], and log in with your MyChart username and password.  Due to Covid, a mask is required upon entering the hospital/clinic. If you do not have a mask, one will be given to you upon arrival. For doctor visits, patients may have 1 support Conrad Zajkowski aged 18 or older with them. For treatment visits, patients cannot have anyone with them due to current Covid guidelines and our immunocompromised population.   

## 2021-12-20 ENCOUNTER — Telehealth: Payer: Self-pay | Admitting: Oncology

## 2021-12-20 NOTE — Telephone Encounter (Signed)
Called patient regarding upcoming July and August appointments, patient has been called and notified.  

## 2021-12-31 NOTE — Progress Notes (Signed)
[facility] OFFICE PROGRESS NOTE  [name], MD [address] 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  He presented with localized disease in 2022.  PRIOR THERAPY:  He is status post parotidectomy with skin resection and rotational flap completed by Dr. Constance Holster in April 2022.  Final pathology showed squamous cell carcinoma.   He is status post radiation therapy under the care of Dr. Isidore Moos completing 33 fractions on December 19, 2020 for a total of 66 Gray    CURRENT THERAPY: Libtayo 350 mg started on August 10, 2021.  He is here for cycle 8 of therapy.  INTERVAL HISTORY: Shaun Mcmillan 81 y.o. male returns to the clinic today for a follow-up visit accompanied by his wife.  The patient was last seen in the clinic on 12/14/2021 by Dr. Alen Blew.  The patient is currently undergoing treatment with immunotherapy with Libtayo.  He tells me he is tolerating Libtayo well, however, he and his wife are concerned because of two 1 x 1 cm hyperpigmented macules on his left cheek have changed color and raised over the last month.  There is a 1 x 1 mm region of crusting on the posterior most macule. Notably this is where he had parotidectomy with skin resection and rotational flap in April 2022. He tells me he has an appointment set up with his dermatologist, Dr. Ronnald Ramp, next Wednesday, but is wondering if he should try to get this moved up sooner.  He denies any recent fever, chills, night sweats, or unexplained weight loss.  He has good appetite, and is eating well.  He denies any nausea, vomiting, diarrhea, or constipation.  Denies any abdominal pain.  Denies any recent hospitalizations or illnesses.  He denies any new facial swelling, this has resolved since his last visit.  Denies any shortness of breath, cough, or hemoptysis.  Dr. Alen Blew is going to arrange for repeat imaging in August 2023 which  has not been scheduled at this time.  The patient is here today for evaluation and repeat blood work before undergoing his next cycle of treatment with cycle #8.   MEDICAL HISTORY: Past Medical History:  Diagnosis Date   Arthritis    Diabetes mellitus type II    Glaucoma    sees Dr. 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  MEDICATIONS:  Current Outpatient Medications  Medication Sig Dispense Refill   acetaminophen (TYLENOL) 500 MG tablet Take 1,000 mg by mouth every 8 (eight) hours as needed for moderate pain.     amLODipine (NORVASC) 10 MG tablet TAKE 1 TABLET BY MOUTH  DAILY 90 tablet 3   aspirin EC 81 MG tablet Take 1 tablet (81 mg total) by mouth daily. Swallow whole. 30 tablet 11   atorvastatin (LIPITOR) 40 MG tablet TAKE 1 TABLET BY MOUTH  DAILY 90 tablet 3   Cholecalciferol (VITAMIN D PO) Take 5,000 Units by mouth daily.     gabapentin (NEURONTIN) 300 MG capsule TAKE 2 CAPSULES BY MOUTH 3 TIMES DAILY. 180 capsule 1   latanoprost (XALATAN) 0.005 % ophthalmic solution Place 1  drop into both eyes every morning.     LORazepam (ATIVAN) 1 MG tablet Take 1 tablet (1 mg total) by mouth 3 (three) times daily as needed for anxiety. 60 tablet 5   Melatonin 5 MG TABS Take 5 mg by mouth at bedtime.     metFORMIN (GLUCOPHAGE) 500 MG tablet TAKE 1 TABLET BY MOUTH  TWICE DAILY WITH A MEAL 180 tablet 3   Multiple Vitamin (MULTIVITAMIN) tablet Take 1 tablet by mouth daily.     prochlorperazine (COMPAZINE) 10 MG tablet Take 1 tablet (10 mg total) by mouth every 6 (six) hours as needed for nausea or vomiting. 30 tablet 0   ramipril (ALTACE) 10 MG capsule TAKE 1 CAPSULE BY MOUTH  TWICE DAILY 180 capsule 3   timolol (BETIMOL) 0.5 % ophthalmic solution Place 1 drop into both  eyes daily.     No current facility-administered medications for this visit.    SURGICAL HISTORY:  Past Surgical History:  Procedure Laterality Date   APPENDECTOMY     colonscopy  03/12/2016   per Dr. Watt Climes, benign polyps, repeat in 5 yrs   left craniotomy  11/03/08   per Dr. Sherley Bounds for a subdrual hematoma   PAROTIDECTOMY Left 09/20/2020   Procedure: PAROTIDECTOMY with resection of skin and rotational flap;  Surgeon: Izora Gala, MD;  Location: [facility];  Service: ENT;  Laterality: Left;   surgery for ocmpound fracture     right leg    REVIEW OF SYSTEMS:   Review of Systems  Constitutional: Negative for appetite change, chills, fatigue, fever and unexpected weight change.  HENT:   Negative for mouth sores, nosebleeds, sore throat and trouble swallowing.   Eyes: Negative for eye problems and icterus.  Respiratory: Negative for cough, hemoptysis, shortness of breath and wheezing.   Cardiovascular: Negative for chest pain and leg swelling.  Gastrointestinal: Negative for abdominal pain, constipation, diarrhea, nausea and vomiting.  Genitourinary: Negative for bladder incontinence, difficulty urinating, dysuria, frequency and hematuria.   Musculoskeletal: Negative for back pain, gait problem, neck pain and neck stiffness.  Skin: He denies itching, pain, or swelling. Positive for skin lesion on left cheek.  Neurological: Negative for dizziness, extremity weakness, gait problem, headaches, light-headedness and seizures.  Hematological: Negative for adenopathy. Does not bruise/bleed easily.  Psychiatric/Behavioral: Negative for confusion, depression and sleep disturbance. The patient is not nervous/anxious.     PHYSICAL EXAMINATION:  Blood pressure 139/70, pulse 71, temperature (!) 97.4 F (36.3 C), temperature source Oral, resp. rate 17, height '5\' 10"'$  (1.778 m), weight 141 lb 9.6 oz (64.2 kg), SpO2 98 %.  ECOG PERFORMANCE STATUS: 0 - Asymptomatic  Physical Exam  Constitutional:  Oriented to person, place, and time and well-developed, well-nourished, and in no distress. No distress.  HENT:  Head: Normocephalic and atraumatic. Positive for  Mouth/Throat: Oropharynx is clear and moist. No oropharyngeal exudate.  Eyes: Conjunctivae are normal. Right eye exhibits no discharge. Left eye exhibits no discharge. No scleral icterus.  Neck: Normal range of motion. Neck supple.  Cardiovascular: Normal rate, regular rhythm, normal heart sounds and intact distal pulses.   Pulmonary/Chest: Effort normal and breath sounds normal. No respiratory distress. No wheezes. No rales.  Abdominal: Soft. Bowel sounds are normal. Exhibits no distension and no mass. There is no tenderness.  Musculoskeletal: Normal range of motion. Exhibits no edema.  Lymphadenopathy:    No cervical adenopathy.  Neurological: Alert and oriented to person, place, and time. Exhibits normal muscle tone. Gait normal. Coordination normal.  Skin: Skin is warm and dry. Two 1 x 1 cm hyperpigmented macules on the left buccal region.  Posterior-most macule has a 1 x 1 mm central crusted appearance.  Psychiatric: Mood, memory and judgment normal.  Vitals reviewed.  LABORATORY DATA: Lab Results  Component Value Date   WBC 7.2 01/03/2022   HGB 13.5 01/03/2022   HCT 38.2 (L) 01/03/2022   MCV 85.7 01/03/2022   PLT 245 01/03/2022      Chemistry      Component Value Date/Time   NA 139 01/03/2022 1410   K 3.8 01/03/2022 1410   CL 105 01/03/2022 1410   CO2 26 01/03/2022 1410   BUN 13 01/03/2022 1410   CREATININE 0.85 01/03/2022 1410   CREATININE 0.69 (L) 01/08/2021 1553      Component Value Date/Time   CALCIUM 10.0 01/03/2022 1410   ALKPHOS 107 01/03/2022 1410   AST 16 01/03/2022 1410   ALT 16 01/03/2022 1410   BILITOT 0.5 01/03/2022 1410       RADIOGRAPHIC STUDIES:  No results found.   ASSESSMENT/PLAN:  81 year old man with:  1.  Squamous cell carcinoma of the skin diagnosed in 2022.  He developed  stage IV disease with pulmonary involvement.   He is currently on Libtayo which he has tolerated very well. He had a CT scan in May 2023 which showed overall minimal changes. Per Dr. Hazeline Junker last note, he recommended updating his staging scan after the today's cycle of therapy. The scan has been ordered for 8/8 but not scheduled. I gave the patient the number to radiology scheduling and instructed him to call to get this scheduled as Dr. Alen Blew recommended. This will be beneficial as it will help Dr. Alen Blew determine the next course of action at his next appointment. Labs were reviewed. Recommend that he proceed with treatment today as scheduled.   The patient and his wife are very concerned about the skin lesion on his left cheek. He is scheduled to see dermatology on Wednesday 01/09/22. I re-assured the patient that it is likely ok to keep the appointment as scheduled as I would not expect significant change in a week, however, we called the dermatology office to see if they have any sooner appointments due to their concern.    2.  IV access: No issues reported with peripheral veins at this time.   3.  Antiemetics: No nausea or vomiting reported at this time.  Compazine is available to him.     4.  Autoimmune complications: No complications noted at this time.  These include pneumonitis, colitis and thyroid disease.      5.  Follow-up: He will return in 3 weeks for a follow-up visit.  He will have repeat imaging studies in August and another follow-up after that.   No orders of the defined types were placed in this encounter.    The total time spent in the appointment was 20-29 minutes.   Isabella Ida L Zavien Clubb, PA-C 01/03/22

## 2022-01-03 ENCOUNTER — Inpatient Hospital Stay (HOSPITAL_BASED_OUTPATIENT_CLINIC_OR_DEPARTMENT_OTHER): Payer: Medicare Other | Admitting: Physician Assistant

## 2022-01-03 ENCOUNTER — Inpatient Hospital Stay: Payer: Medicare Other | Attending: Oncology

## 2022-01-03 ENCOUNTER — Other Ambulatory Visit: Payer: Self-pay

## 2022-01-03 ENCOUNTER — Inpatient Hospital Stay: Payer: Medicare Other

## 2022-01-03 VITALS — BP 139/70 | HR 71 | Temp 97.4°F | Resp 17 | Ht 70.0 in | Wt 141.6 lb

## 2022-01-03 DIAGNOSIS — Z5112 Encounter for antineoplastic immunotherapy: Secondary | ICD-10-CM | POA: Diagnosis not present

## 2022-01-03 DIAGNOSIS — C7989 Secondary malignant neoplasm of other specified sites: Secondary | ICD-10-CM

## 2022-01-03 DIAGNOSIS — Z79899 Other long term (current) drug therapy: Secondary | ICD-10-CM | POA: Insufficient documentation

## 2022-01-03 DIAGNOSIS — C44329 Squamous cell carcinoma of skin of other parts of face: Secondary | ICD-10-CM | POA: Insufficient documentation

## 2022-01-03 LAB — CBC WITH DIFFERENTIAL (CANCER CENTER ONLY)
Abs Immature Granulocytes: 0.02 10*3/uL (ref 0.00–0.07)
Basophils Absolute: 0 10*3/uL (ref 0.0–0.1)
Basophils Relative: 0 %
Eosinophils Absolute: 0.4 10*3/uL (ref 0.0–0.5)
Eosinophils Relative: 6 %
HCT: 38.2 % — ABNORMAL LOW (ref 39.0–52.0)
Hemoglobin: 13.5 g/dL (ref 13.0–17.0)
Immature Granulocytes: 0 %
Lymphocytes Relative: 21 %
Lymphs Abs: 1.5 10*3/uL (ref 0.7–4.0)
MCH: 30.3 pg (ref 26.0–34.0)
MCHC: 35.3 g/dL (ref 30.0–36.0)
MCV: 85.7 fL (ref 80.0–100.0)
Monocytes Absolute: 0.5 10*3/uL (ref 0.1–1.0)
Monocytes Relative: 7 %
Neutro Abs: 4.7 10*3/uL (ref 1.7–7.7)
Neutrophils Relative %: 66 %
Platelet Count: 245 10*3/uL (ref 150–400)
RBC: 4.46 MIL/uL (ref 4.22–5.81)
RDW: 13.5 % (ref 11.5–15.5)
WBC Count: 7.2 10*3/uL (ref 4.0–10.5)
nRBC: 0 % (ref 0.0–0.2)

## 2022-01-03 LAB — CMP (CANCER CENTER ONLY)
ALT: 16 U/L (ref 0–44)
AST: 16 U/L (ref 15–41)
Albumin: 4.1 g/dL (ref 3.5–5.0)
Alkaline Phosphatase: 107 U/L (ref 38–126)
Anion gap: 8 (ref 5–15)
BUN: 13 mg/dL (ref 8–23)
CO2: 26 mmol/L (ref 22–32)
Calcium: 10 mg/dL (ref 8.9–10.3)
Chloride: 105 mmol/L (ref 98–111)
Creatinine: 0.85 mg/dL (ref 0.61–1.24)
GFR, Estimated: >60 mL/min (ref >60)
Glucose, Bld: 116 mg/dL — ABNORMAL HIGH (ref 70–99)
Potassium: 3.8 mmol/L (ref 3.5–5.1)
Sodium: 139 mmol/L (ref 135–145)
Total Bilirubin: 0.5 mg/dL (ref 0.3–1.2)
Total Protein: 6.7 g/dL (ref 6.5–8.1)

## 2022-01-03 LAB — TSH: TSH: 1.88 u[IU]/mL (ref 0.350–4.500)

## 2022-01-03 MED ORDER — SODIUM CHLORIDE 0.9 % IV SOLN: Freq: Once | INTRAVENOUS | Status: AC

## 2022-01-03 MED ORDER — SODIUM CHLORIDE 0.9 % IV SOLN
350.0000 mg | Freq: Once | INTRAVENOUS | Status: AC
  Administered 2022-01-03: 350 mg via INTRAVENOUS
  Filled 2022-01-03: qty 7

## 2022-01-03 NOTE — Patient Instructions (Signed)
[facility] ONCOLOGY   Discharge Instructions: Thank you for choosing [facility] to provide your oncology and hematology care.   If you have a lab appointment with the [facility], please go directly to the [facility] and check in at the registration area.   Wear comfortable clothing and clothing appropriate for easy access to any Portacath or PICC line.   We strive to give you quality time with your provider. You may need to reschedule your appointment if you arrive late (15 or more minutes).  Arriving late affects you and other patients whose appointments are after yours.  Also, if you miss three or more appointments without notifying the office, you may be dismissed from the clinic at the provider's discretion.      For prescription refill requests, have your pharmacy contact our office and allow 72 hours for refills to be completed.    Today you received the following chemotherapy and/or immunotherapy agents: [id number]      To help prevent nausea and vomiting after your treatment, we encourage you to take your nausea medication as directed.  BELOW ARE SYMPTOMS THAT SHOULD BE REPORTED IMMEDIATELY: *FEVER GREATER THAN 100.4 F (38 C) OR HIGHER *CHILLS OR SWEATING *NAUSEA AND VOMITING THAT IS NOT CONTROLLED WITH YOUR NAUSEA MEDICATION *UNUSUAL SHORTNESS OF BREATH *UNUSUAL BRUISING OR BLEEDING *URINARY PROBLEMS (pain or burning when urinating, or frequent urination) *BOWEL PROBLEMS (unusual diarrhea, constipation, pain near the anus) TENDERNESS IN MOUTH AND THROAT WITH OR WITHOUT PRESENCE OF ULCERS (sore throat, sores in mouth, or a toothache) UNUSUAL RASH, SWELLING OR PAIN  UNUSUAL VAGINAL DISCHARGE OR ITCHING   Items with * indicate a potential emergency and should be followed up as soon as possible or go to the Emergency Department if any problems should occur.  Please show the CHEMOTHERAPY ALERT CARD or IMMUNOTHERAPY ALERT CARD at  check-in to the Emergency Department and triage nurse.  Should you have questions after your visit or need to cancel or reschedule your appointment, please contact [facility]  Dept: [phone]  and follow the prompts.  Office hours are 8:00 a.m. to 4:30 p.m. Monday - Friday. Please note that voicemails left after 4:00 p.m. may not be returned until the following business day.  We are closed weekends and major holidays. You have access to a nurse at all times for urgent questions. Please call the main number to the clinic Dept: [phone] and follow the prompts.   For any non-urgent questions, you may also contact your provider using MyChart. We now offer e-Visits for anyone 30 and older to request care online for non-urgent symptoms. For details visit [email or web address].   Also download the MyChart app! Go to the app store, search "MyChart", open the app, select [facility], and log in with your MyChart username and password.  Masks are optional in the cancer centers. If you would like for your care team to wear a mask while they are taking care of you, please let them know. For doctor visits, patients may have with them one support person who is at least 81 years old. At this time, visitors are not allowed in the infusion area.

## 2022-01-07 ENCOUNTER — Other Ambulatory Visit: Payer: Self-pay

## 2022-01-09 DIAGNOSIS — L570 Actinic keratosis: Secondary | ICD-10-CM | POA: Diagnosis not present

## 2022-01-09 DIAGNOSIS — L821 Other seborrheic keratosis: Secondary | ICD-10-CM | POA: Diagnosis not present

## 2022-01-09 DIAGNOSIS — D225 Melanocytic nevi of trunk: Secondary | ICD-10-CM | POA: Diagnosis not present

## 2022-01-09 DIAGNOSIS — Z85828 Personal history of other malignant neoplasm of skin: Secondary | ICD-10-CM | POA: Diagnosis not present

## 2022-01-15 ENCOUNTER — Other Ambulatory Visit: Payer: Self-pay

## 2022-01-22 ENCOUNTER — Ambulatory Visit (HOSPITAL_COMMUNITY)
Admission: RE | Admit: 2022-01-22 | Discharge: 2022-01-22 | Disposition: A | Discharge status: Home / Self Care | Payer: Medicare Other | Source: Ambulatory Visit | Attending: Oncology | Admitting: Oncology

## 2022-01-22 ENCOUNTER — Encounter (HOSPITAL_COMMUNITY): Payer: Self-pay

## 2022-01-22 DIAGNOSIS — N281 Cyst of kidney, acquired: Secondary | ICD-10-CM | POA: Diagnosis not present

## 2022-01-22 DIAGNOSIS — C7800 Secondary malignant neoplasm of unspecified lung: Secondary | ICD-10-CM | POA: Diagnosis not present

## 2022-01-22 DIAGNOSIS — C4492 Squamous cell carcinoma of skin, unspecified: Secondary | ICD-10-CM | POA: Diagnosis not present

## 2022-01-22 DIAGNOSIS — I2510 Atherosclerotic heart disease of native coronary artery without angina pectoris: Secondary | ICD-10-CM | POA: Diagnosis not present

## 2022-01-22 DIAGNOSIS — C7989 Secondary malignant neoplasm of other specified sites: Secondary | ICD-10-CM | POA: Diagnosis not present

## 2022-01-22 DIAGNOSIS — I358 Other nonrheumatic aortic valve disorders: Secondary | ICD-10-CM | POA: Diagnosis not present

## 2022-01-22 DIAGNOSIS — Z85858 Personal history of malignant neoplasm of other endocrine glands: Secondary | ICD-10-CM | POA: Diagnosis not present

## 2022-01-22 DIAGNOSIS — K5730 Diverticulosis of large intestine without perforation or abscess without bleeding: Secondary | ICD-10-CM | POA: Diagnosis not present

## 2022-01-22 DIAGNOSIS — R918 Other nonspecific abnormal finding of lung field: Secondary | ICD-10-CM | POA: Diagnosis not present

## 2022-01-22 MED ORDER — SODIUM CHLORIDE (PF) 0.9 % IJ SOLN
INTRAMUSCULAR | Status: AC
  Filled 2022-01-22: qty 50

## 2022-01-22 MED ORDER — IOHEXOL 300 MG/ML  SOLN
100.0000 mL | Freq: Once | INTRAMUSCULAR | Status: AC | PRN
Start: 2022-01-22 — End: 2022-01-22
  Administered 2022-01-22: 100 mL via INTRAVENOUS

## 2022-01-24 ENCOUNTER — Other Ambulatory Visit: Payer: Self-pay | Admitting: Oncology

## 2022-01-24 DIAGNOSIS — C7989 Secondary malignant neoplasm of other specified sites: Secondary | ICD-10-CM

## 2022-01-25 ENCOUNTER — Inpatient Hospital Stay (HOSPITAL_BASED_OUTPATIENT_CLINIC_OR_DEPARTMENT_OTHER): Payer: Medicare Other | Admitting: Oncology

## 2022-01-25 ENCOUNTER — Inpatient Hospital Stay: Payer: Medicare Other

## 2022-01-25 ENCOUNTER — Other Ambulatory Visit: Payer: Self-pay

## 2022-01-25 ENCOUNTER — Inpatient Hospital Stay: Payer: Medicare Other | Attending: Oncology

## 2022-01-25 VITALS — BP 141/72 | HR 76 | Temp 97.7°F | Resp 17 | Ht 70.0 in | Wt 139.9 lb

## 2022-01-25 DIAGNOSIS — C7989 Secondary malignant neoplasm of other specified sites: Secondary | ICD-10-CM

## 2022-01-25 DIAGNOSIS — Z5112 Encounter for antineoplastic immunotherapy: Secondary | ICD-10-CM | POA: Insufficient documentation

## 2022-01-25 DIAGNOSIS — I2510 Atherosclerotic heart disease of native coronary artery without angina pectoris: Secondary | ICD-10-CM | POA: Diagnosis not present

## 2022-01-25 DIAGNOSIS — Z79899 Other long term (current) drug therapy: Secondary | ICD-10-CM | POA: Diagnosis not present

## 2022-01-25 DIAGNOSIS — C44329 Squamous cell carcinoma of skin of other parts of face: Secondary | ICD-10-CM | POA: Insufficient documentation

## 2022-01-25 LAB — CMP (CANCER CENTER ONLY)
ALT: 17 U/L (ref 0–44)
AST: 17 U/L (ref 15–41)
Albumin: 4.3 g/dL (ref 3.5–5.0)
Alkaline Phosphatase: 129 U/L — ABNORMAL HIGH (ref 38–126)
Anion gap: 8 (ref 5–15)
BUN: 13 mg/dL (ref 8–23)
CO2: 28 mmol/L (ref 22–32)
Calcium: 9.6 mg/dL (ref 8.9–10.3)
Chloride: 104 mmol/L (ref 98–111)
Creatinine: 0.88 mg/dL (ref 0.61–1.24)
GFR, Estimated: >60 mL/min (ref >60)
Glucose, Bld: 153 mg/dL — ABNORMAL HIGH (ref 70–99)
Potassium: 3.6 mmol/L (ref 3.5–5.1)
Sodium: 140 mmol/L (ref 135–145)
Total Bilirubin: 0.6 mg/dL (ref 0.3–1.2)
Total Protein: 7 g/dL (ref 6.5–8.1)

## 2022-01-25 LAB — CBC WITH DIFFERENTIAL (CANCER CENTER ONLY)
Abs Immature Granulocytes: 0.02 10*3/uL (ref 0.00–0.07)
Basophils Absolute: 0 10*3/uL (ref 0.0–0.1)
Basophils Relative: 0 %
Eosinophils Absolute: 0.3 10*3/uL (ref 0.0–0.5)
Eosinophils Relative: 4 %
HCT: 39.8 % (ref 39.0–52.0)
Hemoglobin: 13.8 g/dL (ref 13.0–17.0)
Immature Granulocytes: 0 %
Lymphocytes Relative: 21 %
Lymphs Abs: 1.3 10*3/uL (ref 0.7–4.0)
MCH: 29.7 pg (ref 26.0–34.0)
MCHC: 34.7 g/dL (ref 30.0–36.0)
MCV: 85.6 fL (ref 80.0–100.0)
Monocytes Absolute: 0.5 10*3/uL (ref 0.1–1.0)
Monocytes Relative: 8 %
Neutro Abs: 4.1 10*3/uL (ref 1.7–7.7)
Neutrophils Relative %: 67 %
Platelet Count: 214 10*3/uL (ref 150–400)
RBC: 4.65 MIL/uL (ref 4.22–5.81)
RDW: 13.5 % (ref 11.5–15.5)
WBC Count: 6.1 10*3/uL (ref 4.0–10.5)
nRBC: 0 % (ref 0.0–0.2)

## 2022-01-25 LAB — TSH: TSH: 2.316 u[IU]/mL (ref 0.350–4.500)

## 2022-01-25 MED ORDER — SODIUM CHLORIDE 0.9 % IV SOLN: Freq: Once | INTRAVENOUS | Status: AC

## 2022-01-25 MED ORDER — SODIUM CHLORIDE 0.9 % IV SOLN
350.0000 mg | Freq: Once | INTRAVENOUS | Status: AC
  Administered 2022-01-25: 350 mg via INTRAVENOUS
  Filled 2022-01-25: qty 7

## 2022-01-25 NOTE — Progress Notes (Signed)
Hematology and Oncology Follow Up Visit  Arshawn Valdez [id number] 1941-02-06 81 y.o. 01/25/2022 10:48 AM [name], MDFry, [name], MD   Principle Diagnosis: 81 year old man with squamous cell carcinoma of the skin diagnosed in 2022.  He was found to have stage IV with pulmonary involvement 2023.  Prior Therapy:  He is status post parotidectomy with skin resection and rotational flap completed by Dr. Constance Holster in April 2022.  Final pathology showed squamous cell carcinoma.  He is status post radiation therapy under the care of Dr. Isidore Moos completing 33 fractions on December 19, 2020 for a total of 66 Gray   Current therapy: Libtayo 350 mg started on August 10, 2021.  He presents for his subsequent cycle of therapy which is #9.  Interim History: Mr. Bramel returns today for follow-up.  Since last visit, he reports feeling well without any major complaints.  He denies any recent hospitalizations or illnesses.  He denies chest pain or shortness of breath.  He denies any cough or wheezing.  He denies any skin rashes or lesions.  He has reported occasional swelling in his lip and tongue but overall no dyspnea or dysphagia.     Medications: Updated on review. Current Outpatient Medications  Medication Sig Dispense Refill   acetaminophen (TYLENOL) 500 MG tablet Take 1,000 mg by mouth every 8 (eight) hours as needed for moderate pain.     amLODipine (NORVASC) 10 MG tablet TAKE 1 TABLET BY MOUTH  DAILY 90 tablet 3   aspirin EC 81 MG tablet Take 1 tablet (81 mg total) by mouth daily. Swallow whole. 30 tablet 11   atorvastatin (LIPITOR) 40 MG tablet TAKE 1 TABLET BY MOUTH  DAILY 90 tablet 3   Cholecalciferol (VITAMIN D PO) Take 5,000 Units by mouth daily.     gabapentin (NEURONTIN) 300 MG capsule TAKE 2 CAPSULES BY MOUTH 3 TIMES DAILY. 180 capsule 1   latanoprost (XALATAN) 0.005 % ophthalmic solution Place 1 drop into both eyes every morning.     LORazepam (ATIVAN) 1 MG tablet Take 1 tablet  (1 mg total) by mouth 3 (three) times daily as needed for anxiety. 60 tablet 5   Melatonin 5 MG TABS Take 5 mg by mouth at bedtime.     metFORMIN (GLUCOPHAGE) 500 MG tablet TAKE 1 TABLET BY MOUTH  TWICE DAILY WITH A MEAL 180 tablet 3   Multiple Vitamin (MULTIVITAMIN) tablet Take 1 tablet by mouth daily.     prochlorperazine (COMPAZINE) 10 MG tablet Take 1 tablet (10 mg total) by mouth every 6 (six) hours as needed for nausea or vomiting. 30 tablet 0   ramipril (ALTACE) 10 MG capsule TAKE 1 CAPSULE BY MOUTH  TWICE DAILY 180 capsule 3   timolol (BETIMOL) 0.5 % ophthalmic solution Place 1 drop into both eyes daily.     No current facility-administered medications for this visit.     Allergies: No Known Allergies    Physical Exam:     Blood pressure (!) 141/72, pulse 76, temperature 97.7 F (36.5 C), temperature source Temporal, resp. rate 17, height '5\' 10"'$  (1.778 m), weight 139 lb 14.4 oz (63.5 kg), SpO2 98 %.    ECOG: 0     General appearance: Alert, awake without any distress. Head: Atraumatic without abnormalities Oropharynx: Without any thrush or ulcers. Eyes: No scleral icterus. Lymph nodes: No lymphadenopathy noted in the cervical, supraclavicular, or axillary nodes Heart:regular rate and rhythm, without any murmurs or gallops.   Lung: Clear to auscultation without  any rhonchi, wheezes or dullness to percussion. Abdomin: Soft, nontender without any shifting dullness or ascites. Musculoskeletal: No clubbing or cyanosis. Neurological: No motor or sensory deficits. Skin: No rashes or lesions.          Lab Results: Lab Results  Component Value Date   WBC 6.1 01/25/2022   HGB 13.8 01/25/2022   HCT 39.8 01/25/2022   MCV 85.6 01/25/2022   PLT 214 01/25/2022   PSA 1.54 09/05/2021     Chemistry      Component Value Date/Time   NA 139 01/03/2022 1410   K 3.8 01/03/2022 1410   CL 105 01/03/2022 1410   CO2 26 01/03/2022 1410   BUN 13 01/03/2022 1410    CREATININE 0.85 01/03/2022 1410   CREATININE 0.69 (L) 01/08/2021 1553      Component Value Date/Time   CALCIUM 10.0 01/03/2022 1410   ALKPHOS 107 01/03/2022 1410   AST 16 01/03/2022 1410   ALT 16 01/03/2022 1410   BILITOT 0.5 01/03/2022 1410        IMPRESSION: 1. Significant interval decrease in size of previously seen pulmonary nodules, consistent with treatment response. 2. A previously described hyperenhancing lesion of the liver dome is only very faintly appreciated on this examination, measuring no greater than 1.1 cm. As previously noted, this may be a small incidental hemangioma although is incompletely characterized. The appearance of this lesion is most likely exquisitely sensitive to exact phase of contrast administration. Given size and location, further characterization by multiphasic contrast enhanced MRI may be difficult, although can be considered. Otherwise continued attention on follow-up on restaging imaging. 3. Previously noted main pancreatic duct dilatation is diminished, the duct currently measuring no greater than 0.4 cm on today's examination. This is of uncertain significance. Attention on follow-up. 4. Coronary artery disease. 5. Aortic valve calcifications. Correlate for echocardiographic evidence of aortic valve dysfunction.   Impression and Plan:  81 year old man with:  1.  Stage IV squamous cell carcinoma of the skin with pulmonary involvement diagnosed in 2023.     His disease status was updated at this time and treatment options were discussed.  CT scan of the chest and the neck obtained on August 8 were reviewed and showed positive response to therapy with reduction in the dominant lung nodules.  Complication associated with this treatment including autoimmune issues as well as GI toxicity were discussed and he is agreeable to continue.  We will update his staging scan in 3 months.  2.  IV access: Peripheral veins are currently in use  without any issues.   3.  Antiemetics: Compazine is available to him without any nausea or vomiting.   4.  Autoimmune complications: I continue to educate about potential complication including pneumonitis, colitis and thyroid disease.    5.  Follow-up: In 3 weeks for the next cycle of therapy.   30  minutes were dedicated to this visit.  The time was spent on reviewing laboratory data, disease status update, reviewing imaging studies and future plan of care discussion.  Zola Button, MD 8/11/202310:48 AM

## 2022-01-25 NOTE — Patient Instructions (Signed)
Visit Information  Thank you for taking time to visit with me today. Please don't hesitate to contact me if I can be of assistance to you.   Following are the goals we discussed today:   Goals Addressed             This Visit's Progress    COMPLETED: Care Coordination Activities - no follow up required       Care Coordination Interventions: Provided education to patient re: annual Wellness visit, diabetic eye exams and foot exams Reviewed scheduled/upcoming provider appointments including cancer center appts Assessed social determinant of health barriers No further interventions needed           Please call the care guide team at [phone] if you need to cancel or reschedule your appointment.   If you are experiencing a Mental Health or [facility] or need someone to talk to, please call the Suicide and Crisis Lifeline: 988   Patient verbalizes understanding of instructions and care plan provided today and agrees to view in [facility]. Active MyChart status and patient understanding of how to access instructions and care plan via MyChart confirmed with patient.     No further follow up required: no further interventions needed Peter Garter RN, Jackquline Denmark, [facility] [phone]

## 2022-01-25 NOTE — Patient Outreach (Signed)
  Care Coordination   Initial Visit Note   01/25/2022 Name: Shaun Mcmillan MRN: [id number] DOB: Nov 14, 1940  Shaun Mcmillan is a 81 y.o. year old male who sees [name], MD for primary care. I spoke with  Alfonse Ras by phone today  What matters to the patients health and wellness today?  I am doing  great and I had a good report from the cancer center today. Denies any further needs.   Goals Addressed             This Visit's Progress    COMPLETED: Care Coordination Activities - no follow up required       Care Coordination Interventions: Provided education to patient re: annual Wellness visit, diabetic eye exams and foot exams Reviewed scheduled/upcoming provider appointments including cancer center appts Assessed social determinant of health barriers No further interventions needed          SDOH assessments and interventions completed:  Yes  SDOH Interventions Today    Flowsheet Row Most Recent Value  SDOH Interventions   Food Insecurity Interventions Intervention Not Indicated  Housing Interventions Intervention Not Indicated  Transportation Interventions Intervention Not Indicated        Care Coordination Interventions Activated:  Yes  Care Coordination Interventions:  Yes, provided   Follow up plan: No further intervention required.   Encounter Outcome:  Pt. Visit Completed  Peter Garter RN, BSN,CCM, CDE Care Management Coordinator [facility] [phone]

## 2022-01-25 NOTE — Patient Instructions (Signed)
[facility]   Discharge Instructions: Thank you for choosing [facility] to provide your oncology and hematology care.   If you have a lab appointment with the [facility], please go directly to the [facility] and check in at the registration area.   Wear comfortable clothing and clothing appropriate for easy access to any Portacath or PICC line.   We strive to give you quality time with your provider. You may need to reschedule your appointment if you arrive late (15 or more minutes).  Arriving late affects you and other patients whose appointments are after yours.  Also, if you miss three or more appointments without notifying the office, you may be dismissed from the clinic at the provider's discretion.      For prescription refill requests, have your pharmacy contact our office and allow 72 hours for refills to be completed.    Today you received the following chemotherapy and/or immunotherapy agents: [id number]      To help prevent nausea and vomiting after your treatment, we encourage you to take your nausea medication as directed.  BELOW ARE SYMPTOMS THAT SHOULD BE REPORTED IMMEDIATELY: *FEVER GREATER THAN 100.4 F (38 C) OR HIGHER *CHILLS OR SWEATING *NAUSEA AND VOMITING THAT IS NOT CONTROLLED WITH YOUR NAUSEA MEDICATION *UNUSUAL SHORTNESS OF BREATH *UNUSUAL BRUISING OR BLEEDING *URINARY PROBLEMS (pain or burning when urinating, or frequent urination) *BOWEL PROBLEMS (unusual diarrhea, constipation, pain near the anus) TENDERNESS IN MOUTH AND THROAT WITH OR WITHOUT PRESENCE OF ULCERS (sore throat, sores in mouth, or a toothache) UNUSUAL RASH, SWELLING OR PAIN  UNUSUAL VAGINAL DISCHARGE OR ITCHING   Items with * indicate a potential emergency and should be followed up as soon as possible or go to the Emergency Department if any problems should occur.  Please show the CHEMOTHERAPY ALERT CARD or IMMUNOTHERAPY ALERT CARD at  check-in to the Emergency Department and triage nurse.  Should you have questions after your visit or need to cancel or reschedule your appointment, please contact [facility]  Dept: [phone]  and follow the prompts.  Office hours are 8:00 a.m. to 4:30 p.m. Monday - Friday. Please note that voicemails left after 4:00 p.m. may not be returned until the following business day.  We are closed weekends and major holidays. You have access to a nurse at all times for urgent questions. Please call the main number to the clinic Dept: [phone] and follow the prompts.   For any non-urgent questions, you may also contact your provider using MyChart. We now offer e-Visits for anyone 23 and older to request care online for non-urgent symptoms. For details visit [email or web address].   Also download the MyChart app! Go to the app store, search "MyChart", open the app, select [facility], and log in with your MyChart username and password.  Masks are optional in the cancer centers. If you would like for your care team to wear a mask while they are taking care of you, please let them know. For doctor visits, patients may have with them one support person who is at least 81 years old. At this time, visitors are not allowed in the infusion area.

## 2022-01-26 LAB — T4: T4, Total: 6.1 ug/dL (ref 4.5–12.0)

## 2022-02-11 DIAGNOSIS — C801 Malignant (primary) neoplasm, unspecified: Secondary | ICD-10-CM | POA: Diagnosis not present

## 2022-02-11 DIAGNOSIS — H93293 Other abnormal auditory perceptions, bilateral: Secondary | ICD-10-CM | POA: Diagnosis not present

## 2022-02-11 DIAGNOSIS — C7800 Secondary malignant neoplasm of unspecified lung: Secondary | ICD-10-CM | POA: Diagnosis not present

## 2022-02-11 DIAGNOSIS — H6122 Impacted cerumen, left ear: Secondary | ICD-10-CM | POA: Diagnosis not present

## 2022-02-11 DIAGNOSIS — Z974 Presence of external hearing-aid: Secondary | ICD-10-CM | POA: Diagnosis not present

## 2022-02-15 ENCOUNTER — Inpatient Hospital Stay (HOSPITAL_BASED_OUTPATIENT_CLINIC_OR_DEPARTMENT_OTHER): Payer: Medicare Other | Admitting: Oncology

## 2022-02-15 ENCOUNTER — Inpatient Hospital Stay: Payer: Medicare Other | Attending: Oncology

## 2022-02-15 ENCOUNTER — Other Ambulatory Visit: Payer: Self-pay

## 2022-02-15 ENCOUNTER — Inpatient Hospital Stay: Payer: Medicare Other

## 2022-02-15 VITALS — BP 156/68 | HR 61 | Resp 17

## 2022-02-15 VITALS — BP 150/83 | HR 64 | Temp 97.8°F | Resp 16 | Ht 70.0 in | Wt 142.2 lb

## 2022-02-15 DIAGNOSIS — Z5112 Encounter for antineoplastic immunotherapy: Secondary | ICD-10-CM | POA: Diagnosis not present

## 2022-02-15 DIAGNOSIS — Z79899 Other long term (current) drug therapy: Secondary | ICD-10-CM | POA: Diagnosis not present

## 2022-02-15 DIAGNOSIS — C7989 Secondary malignant neoplasm of other specified sites: Secondary | ICD-10-CM

## 2022-02-15 DIAGNOSIS — C44329 Squamous cell carcinoma of skin of other parts of face: Secondary | ICD-10-CM | POA: Insufficient documentation

## 2022-02-15 LAB — CBC WITH DIFFERENTIAL (CANCER CENTER ONLY)
Abs Immature Granulocytes: 0.02 10*3/uL (ref 0.00–0.07)
Basophils Absolute: 0 10*3/uL (ref 0.0–0.1)
Basophils Relative: 1 %
Eosinophils Absolute: 0.6 10*3/uL — ABNORMAL HIGH (ref 0.0–0.5)
Eosinophils Relative: 12 %
HCT: 37.9 % — ABNORMAL LOW (ref 39.0–52.0)
Hemoglobin: 13.3 g/dL (ref 13.0–17.0)
Immature Granulocytes: 0 %
Lymphocytes Relative: 18 %
Lymphs Abs: 0.9 10*3/uL (ref 0.7–4.0)
MCH: 30 pg (ref 26.0–34.0)
MCHC: 35.1 g/dL (ref 30.0–36.0)
MCV: 85.6 fL (ref 80.0–100.0)
Monocytes Absolute: 0.5 10*3/uL (ref 0.1–1.0)
Monocytes Relative: 10 %
Neutro Abs: 3.1 10*3/uL (ref 1.7–7.7)
Neutrophils Relative %: 59 %
Platelet Count: 207 10*3/uL (ref 150–400)
RBC: 4.43 MIL/uL (ref 4.22–5.81)
RDW: 13.4 % (ref 11.5–15.5)
WBC Count: 5.2 10*3/uL (ref 4.0–10.5)
nRBC: 0 % (ref 0.0–0.2)

## 2022-02-15 LAB — CMP (CANCER CENTER ONLY)
ALT: 13 U/L (ref 0–44)
AST: 15 U/L (ref 15–41)
Albumin: 4.1 g/dL (ref 3.5–5.0)
Alkaline Phosphatase: 125 U/L (ref 38–126)
Anion gap: 6 (ref 5–15)
BUN: 10 mg/dL (ref 8–23)
CO2: 29 mmol/L (ref 22–32)
Calcium: 9.5 mg/dL (ref 8.9–10.3)
Chloride: 105 mmol/L (ref 98–111)
Creatinine: 0.84 mg/dL (ref 0.61–1.24)
GFR, Estimated: >60 mL/min (ref >60)
Glucose, Bld: 168 mg/dL — ABNORMAL HIGH (ref 70–99)
Potassium: 3.6 mmol/L (ref 3.5–5.1)
Sodium: 140 mmol/L (ref 135–145)
Total Bilirubin: 0.6 mg/dL (ref 0.3–1.2)
Total Protein: 6.8 g/dL (ref 6.5–8.1)

## 2022-02-15 LAB — TSH: TSH: 2.165 u[IU]/mL (ref 0.350–4.500)

## 2022-02-15 MED ORDER — SODIUM CHLORIDE 0.9 % IV SOLN: Freq: Once | INTRAVENOUS | Status: AC

## 2022-02-15 MED ORDER — SODIUM CHLORIDE 0.9 % IV SOLN
350.0000 mg | Freq: Once | INTRAVENOUS | Status: AC
  Administered 2022-02-15: 350 mg via INTRAVENOUS
  Filled 2022-02-15: qty 7

## 2022-02-15 NOTE — Progress Notes (Signed)
Hematology and Oncology Follow Up Visit  Shaun Mcmillan [id number] October 02, 1940 81 y.o. 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  He presented with localized disease in 2022.  Prior Therapy:  He is status post parotidectomy with skin resection and rotational flap completed by Dr. Constance Mcmillan in April 2022.  Final pathology showed squamous cell carcinoma.  He is status post radiation therapy under the care of Dr. Isidore Mcmillan completing 33 fractions on December 19, 2020 for a total of 66 Gray   Current therapy: Libtayo 350 mg started on August 10, 2021.  He returns for cycle 9 of therapy.  Interim History: Shaun Mcmillan is here for repeat evaluation.  Since the last visit, he reports no major changes in his health.  He denies any nausea, vomiting or abdominal pain.  He denies any hospitalizations or illnesses.  He denies any skin rashes or lesions.  He denies any respiratory complaints.  His performance status quality of life remains unchanged.  He has reported occasional tongue swelling and lip swelling that is periodic and not persistent.  He still able to eat and gained more weight.     Medications: Reviewed without changes. Current Outpatient Medications  Medication Sig Dispense Refill   acetaminophen (TYLENOL) 500 MG tablet Take 1,000 mg by mouth every 8 (eight) hours as needed for moderate pain.     amLODipine (NORVASC) 10 MG tablet TAKE 1 TABLET BY MOUTH  DAILY 90 tablet 3   aspirin EC 81 MG tablet Take 1 tablet (81 mg total) by mouth daily. Swallow whole. 30 tablet 11   atorvastatin (LIPITOR) 40 MG tablet TAKE 1 TABLET BY MOUTH  DAILY 90 tablet 3   Cholecalciferol (VITAMIN D PO) Take 5,000 Units by mouth daily.     gabapentin (NEURONTIN) 300 MG capsule TAKE 2 CAPSULES BY MOUTH 3 TIMES DAILY. 180 capsule 1   latanoprost (XALATAN) 0.005 % ophthalmic  solution Place 1 drop into both eyes every morning.     LORazepam (ATIVAN) 1 MG tablet Take 1 tablet (1 mg total) by mouth 3 (three) times daily as needed for anxiety. 60 tablet 5   Melatonin 5 MG TABS Take 5 mg by mouth at bedtime.     metFORMIN (GLUCOPHAGE) 500 MG tablet TAKE 1 TABLET BY MOUTH  TWICE DAILY WITH A MEAL 180 tablet 3   Multiple Vitamin (MULTIVITAMIN) tablet Take 1 tablet by mouth daily.     prochlorperazine (COMPAZINE) 10 MG tablet Take 1 tablet (10 mg total) by mouth every 6 (six) hours as needed for nausea or vomiting. 30 tablet 0   ramipril (ALTACE) 10 MG capsule TAKE 1 CAPSULE BY MOUTH  TWICE DAILY 180 capsule 3   timolol (BETIMOL) 0.5 % ophthalmic solution Place 1 drop into both eyes daily.     No current facility-administered medications for this visit.     Allergies: No Known Allergies    Physical Exam:   Blood pressure (!) 150/83, pulse 64, temperature 97.8 F (36.6 C), temperature source Temporal, resp. rate 16, height '5\' 10"'$  (1.778 m), weight 142 lb 3.2 oz (64.5 kg), SpO2 (!) 16 %.       ECOG: 0    General appearance: Comfortable appearing without any discomfort Head: Normocephalic without any trauma Oropharynx: Mucous membranes are moist and pink without any thrush or ulcers. Eyes: Pupils are equal and round reactive to light.  Lymph nodes: No cervical, supraclavicular, inguinal or axillary lymphadenopathy.   Heart:regular rate and rhythm.  S1 and S2 without leg edema. Lung: Clear without any rhonchi or wheezes.  No dullness to percussion. Abdomin: Soft, nontender, nondistended with good bowel sounds.  No hepatosplenomegaly. Musculoskeletal: No joint deformity or effusion.  Full range of motion noted. Neurological: No deficits noted on motor, sensory and deep tendon reflex exam. Skin: No petechial rash or dryness.  Appeared moist.           Lab Results: Lab Results  Component Value Date   WBC 5.2 02/15/2022   HGB 13.3 02/15/2022   HCT  37.9 (L) 02/15/2022   MCV 85.6 02/15/2022   PLT 207 02/15/2022   PSA 1.54 09/05/2021     Chemistry      Component Value Date/Time   NA 140 01/25/2022 1031   K 3.6 01/25/2022 1031   CL 104 01/25/2022 1031   CO2 28 01/25/2022 1031   BUN 13 01/25/2022 1031   CREATININE 0.88 01/25/2022 1031   CREATININE 0.69 (L) 01/08/2021 1553      Component Value Date/Time   CALCIUM 9.6 01/25/2022 1031   ALKPHOS 129 (H) 01/25/2022 1031   AST 17 01/25/2022 1031   ALT 17 01/25/2022 1031   BILITOT 0.6 01/25/2022 1031          Impression and Plan:  81 year old man with:  1.  Squamous cell carcinoma of the skin diagnosed in 2022.  He developed stage IV disease with pulmonary involvement.     He continues to tolerate Libtayo without any major complications.  Risks and benefits of continuing this treatment long-term were reviewed.  Complications that include arthralgias, myalgias, autoimmune complications as well as GI toxicity were discussed.  He is agreeable to continuing we will update his staging scan after his October treatment.   2.  IV access: No issues reported with peripheral veins.  Port-A-Cath option has been deferred.   3.  Antiemetics: No nausea or vomiting reported.  Compazine is available to him.   4.  Autoimmune complications: He has not experienced any complication clued pneumonitis, colitis and thyroid disease.    5.  Follow-up: Return in 3 weeks for a follow-up.   30  minutes were spent on this encounter.  The time was dedicated to reviewing his disease status, treatment choices and addressing complications related to cancer and cancer therapy.  Shaun Button, MD 9/1/20238:14 AM

## 2022-02-15 NOTE — Patient Instructions (Signed)
[facility]   Discharge Instructions: Thank you for choosing [facility] to provide your oncology and hematology care.   If you have a lab appointment with the [facility], please go directly to the [facility] and check in at the registration area.   Wear comfortable clothing and clothing appropriate for easy access to any Portacath or PICC line.   We strive to give you quality time with your provider. You may need to reschedule your appointment if you arrive late (15 or more minutes).  Arriving late affects you and other patients whose appointments are after yours.  Also, if you miss three or more appointments without notifying the office, you may be dismissed from the clinic at the provider's discretion.      For prescription refill requests, have your pharmacy contact our office and allow 72 hours for refills to be completed.    Today you received the following chemotherapy and/or immunotherapy agents: [id number]      To help prevent nausea and vomiting after your treatment, we encourage you to take your nausea medication as directed.  BELOW ARE SYMPTOMS THAT SHOULD BE REPORTED IMMEDIATELY: *FEVER GREATER THAN 100.4 F (38 C) OR HIGHER *CHILLS OR SWEATING *NAUSEA AND VOMITING THAT IS NOT CONTROLLED WITH YOUR NAUSEA MEDICATION *UNUSUAL SHORTNESS OF BREATH *UNUSUAL BRUISING OR BLEEDING *URINARY PROBLEMS (pain or burning when urinating, or frequent urination) *BOWEL PROBLEMS (unusual diarrhea, constipation, pain near the anus) TENDERNESS IN MOUTH AND THROAT WITH OR WITHOUT PRESENCE OF ULCERS (sore throat, sores in mouth, or a toothache) UNUSUAL RASH, SWELLING OR PAIN  UNUSUAL VAGINAL DISCHARGE OR ITCHING   Items with * indicate a potential emergency and should be followed up as soon as possible or go to the Emergency Department if any problems should occur.  Please show the CHEMOTHERAPY ALERT CARD or IMMUNOTHERAPY ALERT CARD at  check-in to the Emergency Department and triage nurse.  Should you have questions after your visit or need to cancel or reschedule your appointment, please contact [facility]  Dept: [phone]  and follow the prompts.  Office hours are 8:00 a.m. to 4:30 p.m. Monday - Friday. Please note that voicemails left after 4:00 p.m. may not be returned until the following business day.  We are closed weekends and major holidays. You have access to a nurse at all times for urgent questions. Please call the main number to the clinic Dept: [phone] and follow the prompts.   For any non-urgent questions, you may also contact your provider using MyChart. We now offer e-Visits for anyone 57 and older to request care online for non-urgent symptoms. For details visit [email or web address].   Also download the MyChart app! Go to the app store, search "MyChart", open the app, select [facility], and log in with your MyChart username and password.  Masks are optional in the cancer centers. If you would like for your care team to wear a mask while they are taking care of you, please let them know. For doctor visits, patients may have with them one support Jaydah Stahle who is at least 81 years old. At this time, visitors are not allowed in the infusion area.

## 2022-02-16 LAB — T4: T4, Total: 6.4 ug/dL (ref 4.5–12.0)

## 2022-02-19 DIAGNOSIS — H2513 Age-related nuclear cataract, bilateral: Secondary | ICD-10-CM | POA: Diagnosis not present

## 2022-02-19 DIAGNOSIS — H401133 Primary open-angle glaucoma, bilateral, severe stage: Secondary | ICD-10-CM | POA: Diagnosis not present

## 2022-02-21 ENCOUNTER — Telehealth: Payer: Self-pay | Admitting: Oncology

## 2022-02-21 NOTE — Telephone Encounter (Signed)
Called patient regarding upcoming September and October appointment, patient is notified. 

## 2022-02-28 DIAGNOSIS — H269 Unspecified cataract: Secondary | ICD-10-CM | POA: Diagnosis not present

## 2022-02-28 DIAGNOSIS — H2511 Age-related nuclear cataract, right eye: Secondary | ICD-10-CM | POA: Diagnosis not present

## 2022-03-08 ENCOUNTER — Other Ambulatory Visit: Payer: Self-pay

## 2022-03-08 ENCOUNTER — Inpatient Hospital Stay: Payer: Medicare Other

## 2022-03-08 ENCOUNTER — Inpatient Hospital Stay (HOSPITAL_BASED_OUTPATIENT_CLINIC_OR_DEPARTMENT_OTHER): Payer: Medicare Other | Admitting: Oncology

## 2022-03-08 VITALS — BP 166/79 | HR 61 | Temp 97.8°F | Resp 16 | Ht 70.0 in | Wt 141.1 lb

## 2022-03-08 DIAGNOSIS — Z5112 Encounter for antineoplastic immunotherapy: Secondary | ICD-10-CM | POA: Diagnosis not present

## 2022-03-08 DIAGNOSIS — C44329 Squamous cell carcinoma of skin of other parts of face: Secondary | ICD-10-CM | POA: Diagnosis not present

## 2022-03-08 DIAGNOSIS — C7989 Secondary malignant neoplasm of other specified sites: Secondary | ICD-10-CM

## 2022-03-08 DIAGNOSIS — Z79899 Other long term (current) drug therapy: Secondary | ICD-10-CM | POA: Diagnosis not present

## 2022-03-08 LAB — CBC WITH DIFFERENTIAL (CANCER CENTER ONLY)
Abs Immature Granulocytes: 0.01 10*3/uL (ref 0.00–0.07)
Basophils Absolute: 0 10*3/uL (ref 0.0–0.1)
Basophils Relative: 0 %
Eosinophils Absolute: 0.6 10*3/uL — ABNORMAL HIGH (ref 0.0–0.5)
Eosinophils Relative: 12 %
HCT: 38.8 % — ABNORMAL LOW (ref 39.0–52.0)
Hemoglobin: 13.4 g/dL (ref 13.0–17.0)
Immature Granulocytes: 0 %
Lymphocytes Relative: 19 %
Lymphs Abs: 1 10*3/uL (ref 0.7–4.0)
MCH: 29.8 pg (ref 26.0–34.0)
MCHC: 34.5 g/dL (ref 30.0–36.0)
MCV: 86.4 fL (ref 80.0–100.0)
Monocytes Absolute: 0.4 10*3/uL (ref 0.1–1.0)
Monocytes Relative: 9 %
Neutro Abs: 3.1 10*3/uL (ref 1.7–7.7)
Neutrophils Relative %: 60 %
Platelet Count: 200 10*3/uL (ref 150–400)
RBC: 4.49 MIL/uL (ref 4.22–5.81)
RDW: 13.4 % (ref 11.5–15.5)
WBC Count: 5.2 10*3/uL (ref 4.0–10.5)
nRBC: 0 % (ref 0.0–0.2)

## 2022-03-08 LAB — CMP (CANCER CENTER ONLY)
ALT: 14 U/L (ref 0–44)
AST: 16 U/L (ref 15–41)
Albumin: 4 g/dL (ref 3.5–5.0)
Alkaline Phosphatase: 110 U/L (ref 38–126)
Anion gap: 5 (ref 5–15)
BUN: 11 mg/dL (ref 8–23)
CO2: 30 mmol/L (ref 22–32)
Calcium: 9.2 mg/dL (ref 8.9–10.3)
Chloride: 104 mmol/L (ref 98–111)
Creatinine: 0.82 mg/dL (ref 0.61–1.24)
GFR, Estimated: >60 mL/min (ref >60)
Glucose, Bld: 223 mg/dL — ABNORMAL HIGH (ref 70–99)
Potassium: 3.5 mmol/L (ref 3.5–5.1)
Sodium: 139 mmol/L (ref 135–145)
Total Bilirubin: 0.6 mg/dL (ref 0.3–1.2)
Total Protein: 6.6 g/dL (ref 6.5–8.1)

## 2022-03-08 LAB — TSH: TSH: 2.589 u[IU]/mL (ref 0.350–4.500)

## 2022-03-08 MED ORDER — SODIUM CHLORIDE 0.9 % IV SOLN: Freq: Once | INTRAVENOUS | Status: AC

## 2022-03-08 MED ORDER — SODIUM CHLORIDE 0.9 % IV SOLN
350.0000 mg | Freq: Once | INTRAVENOUS | Status: AC
  Administered 2022-03-08: 350 mg via INTRAVENOUS
  Filled 2022-03-08: qty 7

## 2022-03-08 NOTE — Patient Instructions (Signed)
[facility]  Discharge Instructions: Thank you for choosing [facility] to provide your oncology and hematology care.   If you have a lab appointment with the Cancer Center, please go directly to the Cancer Center and check in at the registration area.   Wear comfortable clothing and clothing appropriate for easy access to any Portacath or PICC line.   We strive to give you quality time with your provider. You may need to reschedule your appointment if you arrive late (15 or more minutes).  Arriving late affects you and other patients whose appointments are after yours.  Also, if you miss three or more appointments without notifying the office, you may be dismissed from the clinic at the provider's discretion.      For prescription refill requests, have your pharmacy contact our office and allow 72 hours for refills to be completed.    Today you received the following chemotherapy and/or immunotherapy agents: Libtayo      To help prevent nausea and vomiting after your treatment, we encourage you to take your nausea medication as directed.  BELOW ARE SYMPTOMS THAT SHOULD BE REPORTED IMMEDIATELY: *FEVER GREATER THAN 100.4 F (38 C) OR HIGHER *CHILLS OR SWEATING *NAUSEA AND VOMITING THAT IS NOT CONTROLLED WITH YOUR NAUSEA MEDICATION *UNUSUAL SHORTNESS OF BREATH *UNUSUAL BRUISING OR BLEEDING *URINARY PROBLEMS (pain or burning when urinating, or frequent urination) *BOWEL PROBLEMS (unusual diarrhea, constipation, pain near the anus) TENDERNESS IN MOUTH AND THROAT WITH OR WITHOUT PRESENCE OF ULCERS (sore throat, sores in mouth, or a toothache) UNUSUAL RASH, SWELLING OR PAIN  UNUSUAL VAGINAL DISCHARGE OR ITCHING   Items with * indicate a potential emergency and should be followed up as soon as possible or go to the Emergency Department if any problems should occur.  Please show the CHEMOTHERAPY ALERT CARD or IMMUNOTHERAPY ALERT CARD at check-in to  the Emergency Department and triage nurse.  Should you have questions after your visit or need to cancel or reschedule your appointment, please contact [facility]  Dept: [phone]  and follow the prompts.  Office hours are 8:00 a.m. to 4:30 p.m. Monday - Friday. Please note that voicemails left after 4:00 p.m. may not be returned until the following business day.  We are closed weekends and major holidays. You have access to a nurse at all times for urgent questions. Please call the main number to the clinic Dept: [phone] and follow the prompts.   For any non-urgent questions, you may also contact your provider using MyChart. We now offer e-Visits for anyone 18 and older to request care online for non-urgent symptoms. For details visit [email or web address].   Also download the MyChart app! Go to the app store, search "MyChart", open the app, select [facility], and log in with your MyChart username and password.  Masks are optional in the cancer centers. If you would like for your care team to wear a mask while they are taking care of you, please let them know. You may have one support person who is at least 81 years old accompany you for your appointments. 

## 2022-03-08 NOTE — Progress Notes (Signed)
Hematology and Oncology Follow Up Visit  Shaun Mcmillan [id number] 10/30/1940 81 y.o. 03/08/2022 8:09 AM Shaun Mcmillan, MDShadad, Shaun Dad, MD   Principle Diagnosis: 38 year old man with squamous cell carcinoma of the skin presented with localized disease in 2022.  He developed stage IV with pulmonary involvement in 2023.    Prior Therapy:  He is status post parotidectomy with skin resection and rotational flap completed by Dr. Constance Holster in April 2022.  Final pathology showed squamous cell carcinoma.  He is status post radiation therapy under the care of Dr. Isidore Moos completing 33 fractions on December 19, 2020 for a total of 66 Gray   Current therapy: Libtayo 350 mg started on August 10, 2021.  He returns for cycle 10 of therapy.  Interim History: Shaun Mcmillan returns today for repeat follow-up.  Since last visit, he reports that no major changes in his health.  He continues to tolerate Libtayo without any complaints.  He denies any nausea, vomiting or abdominal pain.  He denies any hospitalizations or illnesses.  He denies any skin rashes or GI complaints.  He does report occasional nasal congestion and postnasal drip.     Medications: Updated on review. Current Outpatient Medications  Medication Sig Dispense Refill   acetaminophen (TYLENOL) 500 MG tablet Take 1,000 mg by mouth every 8 (eight) hours as needed for moderate pain.     amLODipine (NORVASC) 10 MG tablet TAKE 1 TABLET BY MOUTH  DAILY 90 tablet 3   aspirin EC 81 MG tablet Take 1 tablet (81 mg total) by mouth daily. Swallow whole. 30 tablet 11   atorvastatin (LIPITOR) 40 MG tablet TAKE 1 TABLET BY MOUTH  DAILY 90 tablet 3   Cholecalciferol (VITAMIN D PO) Take 5,000 Units by mouth daily.     gabapentin (NEURONTIN) 300 MG capsule TAKE 2 CAPSULES BY MOUTH 3 TIMES DAILY. 180 capsule 1   latanoprost (XALATAN) 0.005 % ophthalmic solution Place 1 drop into both eyes every morning.     LORazepam (ATIVAN) 1 MG tablet Take 1 tablet (1 mg  total) by mouth 3 (three) times daily as needed for anxiety. 60 tablet 5   Melatonin 5 MG TABS Take 5 mg by mouth at bedtime.     metFORMIN (GLUCOPHAGE) 500 MG tablet TAKE 1 TABLET BY MOUTH  TWICE DAILY WITH A MEAL 180 tablet 3   Multiple Vitamin (MULTIVITAMIN) tablet Take 1 tablet by mouth daily.     prochlorperazine (COMPAZINE) 10 MG tablet Take 1 tablet (10 mg total) by mouth every 6 (six) hours as needed for nausea or vomiting. 30 tablet 0   ramipril (ALTACE) 10 MG capsule TAKE 1 CAPSULE BY MOUTH  TWICE DAILY 180 capsule 3   timolol (BETIMOL) 0.5 % ophthalmic solution Place 1 drop into both eyes daily.     No current facility-administered medications for this visit.     Allergies: No Known Allergies    Physical Exam:     Blood pressure (!) 166/79, pulse 61, temperature 97.8 F (36.6 C), temperature source Temporal, resp. rate 16, height '5\' 10"'$  (1.778 m), weight 141 lb 1.6 oz (64 kg), SpO2 97 %.      ECOG: 0   General appearance: Alert, awake without any distress. Head: Atraumatic without abnormalities Oropharynx: Without any thrush or ulcers. Eyes: No scleral icterus. Lymph nodes: No lymphadenopathy noted in the cervical, supraclavicular, or axillary nodes Heart:regular rate and rhythm, without any murmurs or gallops.   Lung: Clear to auscultation without any rhonchi, wheezes or  dullness to percussion. Abdomin: Soft, nontender without any shifting dullness or ascites. Musculoskeletal: No clubbing or cyanosis. Neurological: No motor or sensory deficits. Skin: No rashes or lesions.           Lab Results: Lab Results  Component Value Date   WBC 5.2 02/15/2022   HGB 13.3 02/15/2022   HCT 37.9 (L) 02/15/2022   MCV 85.6 02/15/2022   PLT 207 02/15/2022   PSA 1.54 09/05/2021     Chemistry      Component Value Date/Time   NA 140 02/15/2022 0800   K 3.6 02/15/2022 0800   CL 105 02/15/2022 0800   CO2 29 02/15/2022 0800   BUN 10 02/15/2022 0800    CREATININE 0.84 02/15/2022 0800   CREATININE 0.69 (L) 01/08/2021 1553      Component Value Date/Time   CALCIUM 9.5 02/15/2022 0800   ALKPHOS 125 02/15/2022 0800   AST 15 02/15/2022 0800   ALT 13 02/15/2022 0800   BILITOT 0.6 02/15/2022 0800          Impression and Plan:  81 year old man with:  1. Stage IV squamous cell carcinoma of the skin with pulmonary involvement noted in January of 2023.     He continues to be on Libtayo without any major complications.  Risks and benefits of continuing this treatments were reviewed.  Autoimmune complications as well as GI toxicity among others were reiterated.  CT scan obtained on January 22, 2022 continues to show positive response to therapy.  Plan is to update his staging scans in November 2023.  He is agreeable to proceed.  2.  IV access: Exam benefits of a Port-A-Cath insertion were discussed at this time.  Peripheral veins are currently in use.   3.  Antiemetics: Compazine is available to him without any nausea or vomiting.  4.  Autoimmune complications: I continue to educate him about complication clinic pneumonitis, colitis and thyroid disease.    5.  Follow-up: In 3 weeks for a follow-up.   30  minutes were dedicated to this visit.  The time was spent on updating his disease status, reviewing laboratory data, addressing symptoms related to his treatment and future plan of care review.  Shaun Button, MD 9/22/20238:09 AM

## 2022-03-09 LAB — T4: T4, Total: 6.4 ug/dL (ref 4.5–12.0)

## 2022-03-20 ENCOUNTER — Ambulatory Visit (INDEPENDENT_AMBULATORY_CARE_PROVIDER_SITE_OTHER): Payer: Medicare Other

## 2022-03-20 DIAGNOSIS — Z23 Encounter for immunization: Secondary | ICD-10-CM | POA: Diagnosis not present

## 2022-03-22 ENCOUNTER — Other Ambulatory Visit: Payer: Self-pay | Admitting: Neurology

## 2022-03-25 ENCOUNTER — Other Ambulatory Visit: Payer: Self-pay | Admitting: Family Medicine

## 2022-03-29 ENCOUNTER — Inpatient Hospital Stay (HOSPITAL_BASED_OUTPATIENT_CLINIC_OR_DEPARTMENT_OTHER): Payer: Medicare Other | Admitting: Oncology

## 2022-03-29 ENCOUNTER — Inpatient Hospital Stay: Payer: Medicare Other | Attending: Oncology

## 2022-03-29 ENCOUNTER — Inpatient Hospital Stay: Payer: Medicare Other

## 2022-03-29 VITALS — BP 149/81 | HR 69 | Temp 97.9°F | Resp 17 | Ht 70.0 in | Wt 138.2 lb

## 2022-03-29 VITALS — BP 151/76 | HR 60 | Temp 98.0°F | Resp 16

## 2022-03-29 DIAGNOSIS — C44329 Squamous cell carcinoma of skin of other parts of face: Secondary | ICD-10-CM | POA: Insufficient documentation

## 2022-03-29 DIAGNOSIS — C7989 Secondary malignant neoplasm of other specified sites: Secondary | ICD-10-CM

## 2022-03-29 DIAGNOSIS — Z79899 Other long term (current) drug therapy: Secondary | ICD-10-CM | POA: Diagnosis not present

## 2022-03-29 DIAGNOSIS — Z5112 Encounter for antineoplastic immunotherapy: Secondary | ICD-10-CM | POA: Insufficient documentation

## 2022-03-29 LAB — CBC WITH DIFFERENTIAL (CANCER CENTER ONLY)
Abs Immature Granulocytes: 0.01 10*3/uL (ref 0.00–0.07)
Basophils Absolute: 0 10*3/uL (ref 0.0–0.1)
Basophils Relative: 1 %
Eosinophils Absolute: 0.2 10*3/uL (ref 0.0–0.5)
Eosinophils Relative: 3 %
HCT: 39.3 % (ref 39.0–52.0)
Hemoglobin: 13.6 g/dL (ref 13.0–17.0)
Immature Granulocytes: 0 %
Lymphocytes Relative: 15 %
Lymphs Abs: 0.9 10*3/uL (ref 0.7–4.0)
MCH: 30.1 pg (ref 26.0–34.0)
MCHC: 34.6 g/dL (ref 30.0–36.0)
MCV: 86.9 fL (ref 80.0–100.0)
Monocytes Absolute: 0.6 10*3/uL (ref 0.1–1.0)
Monocytes Relative: 10 %
Neutro Abs: 4.5 10*3/uL (ref 1.7–7.7)
Neutrophils Relative %: 71 %
Platelet Count: 263 10*3/uL (ref 150–400)
RBC: 4.52 MIL/uL (ref 4.22–5.81)
RDW: 13.6 % (ref 11.5–15.5)
WBC Count: 6.2 10*3/uL (ref 4.0–10.5)
nRBC: 0 % (ref 0.0–0.2)

## 2022-03-29 LAB — CMP (CANCER CENTER ONLY)
ALT: 16 U/L (ref 0–44)
AST: 18 U/L (ref 15–41)
Albumin: 4.2 g/dL (ref 3.5–5.0)
Alkaline Phosphatase: 101 U/L (ref 38–126)
Anion gap: 6 (ref 5–15)
BUN: 12 mg/dL (ref 8–23)
CO2: 32 mmol/L (ref 22–32)
Calcium: 9.4 mg/dL (ref 8.9–10.3)
Chloride: 105 mmol/L (ref 98–111)
Creatinine: 0.94 mg/dL (ref 0.61–1.24)
GFR, Estimated: >60 mL/min (ref >60)
Glucose, Bld: 158 mg/dL — ABNORMAL HIGH (ref 70–99)
Potassium: 3.6 mmol/L (ref 3.5–5.1)
Sodium: 143 mmol/L (ref 135–145)
Total Bilirubin: 0.7 mg/dL (ref 0.3–1.2)
Total Protein: 7.1 g/dL (ref 6.5–8.1)

## 2022-03-29 LAB — TSH: TSH: 2.202 u[IU]/mL (ref 0.350–4.500)

## 2022-03-29 MED ORDER — SODIUM CHLORIDE 0.9 % IV SOLN: Freq: Once | INTRAVENOUS | Status: AC

## 2022-03-29 MED ORDER — SODIUM CHLORIDE 0.9 % IV SOLN
350.0000 mg | Freq: Once | INTRAVENOUS | Status: AC
  Administered 2022-03-29: 350 mg via INTRAVENOUS
  Filled 2022-03-29: qty 7

## 2022-03-29 NOTE — Progress Notes (Signed)
Hematology and Oncology Follow Up Visit  Shaun Mcmillan [id number] 1940/07/15 81 y.o. 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  He initially presented with localized disease in 2022 including a parotid gland involvement.  Prior Therapy:  He is status post parotidectomy with skin resection and rotational flap completed by Dr. Constance Holster in April 2022.  Final pathology showed squamous cell carcinoma.  He is status post radiation therapy under the care of Dr. Isidore Moos completing 33 fractions on December 19, 2020 for a total of 66 Gray   Current therapy: Libtayo 350 mg started on August 10, 2021.  He returns for cycle 11 of therapy.  Interim History: [name] resents today for a follow-up.  Since the last visit, he reports no major changes in his health.  He continues to tolerate current treatment without any complaints.  He denies any nausea, vomiting or abdominal pain.  He denies any skin rash or pruritus.  He denies any changes in bowel habits.     Medications: Reviewed without changes. Current Outpatient Medications  Medication Sig Dispense Refill   acetaminophen (TYLENOL) 500 MG tablet Take 1,000 mg by mouth every 8 (eight) hours as needed for moderate pain.     amLODipine (NORVASC) 10 MG tablet TAKE 1 TABLET BY MOUTH DAILY 90 tablet 0   aspirin EC 81 MG tablet Take 1 tablet (81 mg total) by mouth daily. Swallow whole. 30 tablet 11   atorvastatin (LIPITOR) 40 MG tablet TAKE 1 TABLET BY MOUTH DAILY 90 tablet 0   Cholecalciferol (VITAMIN D PO) Take 5,000 Units by mouth daily.     gabapentin (NEURONTIN) 300 MG capsule TAKE 2 CAPSULES BY MOUTH 3 TIMES A DAY 180 capsule 1   latanoprost (XALATAN) 0.005 % ophthalmic solution Place 1 drop into both eyes every morning.     LORazepam (ATIVAN) 1 MG tablet Take 1 tablet (1 mg total) by mouth 3  (three) times daily as needed for anxiety. 60 tablet 5   Melatonin 5 MG TABS Take 5 mg by mouth at bedtime.     metFORMIN (GLUCOPHAGE) 500 MG tablet TAKE 1 TABLET BY MOUTH TWICE  DAILY WITH A MEAL 180 tablet 0   Multiple Vitamin (MULTIVITAMIN) tablet Take 1 tablet by mouth daily.     prochlorperazine (COMPAZINE) 10 MG tablet Take 1 tablet (10 mg total) by mouth every 6 (six) hours as needed for nausea or vomiting. 30 tablet 0   ramipril (ALTACE) 10 MG capsule TAKE 1 CAPSULE BY MOUTH TWICE  DAILY 180 capsule 0   timolol (BETIMOL) 0.5 % ophthalmic solution Place 1 drop into both eyes daily.     No current facility-administered medications for this visit.     Allergies: No Known Allergies    Physical Exam:    Blood pressure (!) 149/81, pulse 69, temperature 97.9 F (36.6 C), temperature source Temporal, resp. rate 17, height '5\' 10"'$  (1.778 m), weight 138 lb 3.2 oz (62.7 kg), SpO2 100 %.        ECOG: 0   General appearance: Comfortable appearing without any discomfort Head: Normocephalic without any trauma Oropharynx: Mucous membranes are moist and pink without any thrush or ulcers. Eyes: Pupils are equal and round reactive to light. Lymph nodes: No cervical, supraclavicular, inguinal or axillary lymphadenopathy.   Heart:regular rate and rhythm.  S1 and S2 without leg edema. Lung: Clear without  any rhonchi or wheezes.  No dullness to percussion. Abdomin: Soft, nontender, nondistended with good bowel sounds.  No hepatosplenomegaly. Musculoskeletal: No joint deformity or effusion.  Full range of motion noted. Neurological: No deficits noted on motor, sensory and deep tendon reflex exam. Skin: No petechial rash or dryness.  Appeared moist.            Lab Results: Lab Results  Component Value Date   WBC 6.2 03/29/2022   HGB 13.6 03/29/2022   HCT 39.3 03/29/2022   MCV 86.9 03/29/2022   PLT 263 03/29/2022   PSA 1.54 09/05/2021     Chemistry      Component Value  Date/Time   NA 139 03/08/2022 0820   K 3.5 03/08/2022 0820   CL 104 03/08/2022 0820   CO2 30 03/08/2022 0820   BUN 11 03/08/2022 0820   CREATININE 0.82 03/08/2022 0820   CREATININE 0.69 (L) 01/08/2021 1553      Component Value Date/Time   CALCIUM 9.2 03/08/2022 0820   ALKPHOS 110 03/08/2022 0820   AST 16 03/08/2022 0820   ALT 14 03/08/2022 0820   BILITOT 0.6 03/08/2022 0820          Impression and Plan:  81 year old man with:  1.  Squamous cell carcinoma of the skin diagnosed in 2022.  He developed stage IV disease with pulmonary nodules in 2023.   Risks and benefits of continuing Libtayo were discussed at this time.  Potential complications that include autoimmune considerations, nausea and fatigue were discussed. The plan is to update his staging scan before the next visit.  I have a discussion today he is agreeable to proceed.  Different salvage therapy option will be instituted if he has disease progression.  Continuing this therapy as long as he is benefiting as well as no complications also discussed.   2.  IV access: Peripheral veins are currently in use.  Port-A-Cath option was discussed at this time.  He would like to defer this option unless needed 2.   3.  Antiemetics: No nausea or vomiting reported at this time.  Compazine is available to him.  4.  Autoimmune complications: He has not experienced any issues including pneumonitis, colitis and thyroid disease.  We will continue to monitor and educate about these complications.    5.  Follow-up: He will return in 3 weeks for a follow-up.   30  minutes were spent on this encounter.  The time was dedicated to reviewing laboratory data, disease status update and outlining future plan of care review.  Zola Button, MD 10/13/20239:27 AM

## 2022-03-29 NOTE — Patient Instructions (Signed)
[facility]COL[facility]  Discharge Instructions: Thank you for choosing [facility] to provide your oncology and hematology care.   If you have a lab appointment with the Cancer Center, please go directly to the Cancer Center and check in at the registration area.   Wear comfortable clothing and clothing appropriate for easy access to any Portacath or PICC line.   We strive to give you quality time with your provider. You may need to reschedule your appointment if you arrive late (15 or more minutes).  Arriving late affects you and other patients whose appointments are after yours.  Also, if you miss three or more appointments without notifying the office, you may be dismissed from the clinic at the provider's discretion.      For prescription refill requests, have your pharmacy contact our office and allow 72 hours for refills to be completed.    Today you received the following chemotherapy and/or immunotherapy agents: Libtayo      To help prevent nausea and vomiting after your treatment, we encourage you to take your nausea medication as directed.  BELOW ARE SYMPTOMS THAT SHOULD BE REPORTED IMMEDIATELY: *FEVER GREATER THAN 100.4 F (38 C) OR HIGHER *CHILLS OR SWEATING *NAUSEA AND VOMITING THAT IS NOT CONTROLLED WITH YOUR NAUSEA MEDICATION *UNUSUAL SHORTNESS OF BREATH *UNUSUAL BRUISING OR BLEEDING *URINARY PROBLEMS (pain or burning when urinating, or frequent urination) *BOWEL PROBLEMS (unusual diarrhea, constipation, pain near the anus) TENDERNESS IN MOUTH AND THROAT WITH OR WITHOUT PRESENCE OF ULCERS (sore throat, sores in mouth, or a toothache) UNUSUAL RASH, SWELLING OR PAIN  UNUSUAL VAGINAL DISCHARGE OR ITCHING   Items with * indicate a potential emergency and should be followed up as soon as possible or go to the Emergency Department if any problems should occur.  Please show the CHEMOTHERAPY ALERT CARD or IMMUNOTHERAPY ALERT CARD at check-in to  the Emergency Department and triage nurse.  Should you have questions after your visit or need to cancel or reschedule your appointment, please contact [facility]  Dept: [phone]  and follow the prompts.  Office hours are 8:00 a.m. to 4:30 p.m. Monday - Friday. Please note that voicemails left after 4:00 p.m. may not be returned until the following business day.  We are closed weekends and major holidays. You have access to a nurse at all times for urgent questions. Please call the main number to the clinic Dept: [phone] and follow the prompts.   For any non-urgent questions, you may also contact your provider using MyChart. We now offer e-Visits for anyone 18 and older to request care online for non-urgent symptoms. For details visit [email or web address].   Also download the MyChart app! Go to the app store, search "MyChart", open the app, select [email or web address], and log in with your MyChart username and password.  Masks are optional in the cancer centers. If you would like for your care team to wear a mask while they are taking care of you, please let them know. You may have one support person who is at least 81 years old accompany you for your appointments. 

## 2022-03-30 LAB — T4: T4, Total: 7.8 ug/dL (ref 4.5–12.0)

## 2022-04-04 DIAGNOSIS — H269 Unspecified cataract: Secondary | ICD-10-CM | POA: Diagnosis not present

## 2022-04-04 DIAGNOSIS — H2512 Age-related nuclear cataract, left eye: Secondary | ICD-10-CM | POA: Diagnosis not present

## 2022-04-08 ENCOUNTER — Telehealth: Payer: Self-pay | Admitting: Family Medicine

## 2022-04-08 DIAGNOSIS — R739 Hyperglycemia, unspecified: Secondary | ICD-10-CM

## 2022-04-08 NOTE — Telephone Encounter (Signed)
Pt called to ask if MD could add an A1C check to his list of labs? Please advise.

## 2022-04-11 NOTE — Telephone Encounter (Signed)
Last A1C labs- 09-05-21 Last OV- 09-05-21

## 2022-04-12 NOTE — Telephone Encounter (Signed)
Lvm for patient lab order has been placed.

## 2022-04-12 NOTE — Telephone Encounter (Signed)
Done

## 2022-04-15 ENCOUNTER — Other Ambulatory Visit (INDEPENDENT_AMBULATORY_CARE_PROVIDER_SITE_OTHER): Payer: Medicare Other

## 2022-04-15 DIAGNOSIS — R739 Hyperglycemia, unspecified: Secondary | ICD-10-CM | POA: Diagnosis not present

## 2022-04-15 LAB — HEMOGLOBIN A1C: Hgb A1c MFr Bld: 5.8 % (ref 4.6–6.5)

## 2022-04-17 ENCOUNTER — Encounter (HOSPITAL_COMMUNITY): Payer: Self-pay

## 2022-04-17 ENCOUNTER — Ambulatory Visit (HOSPITAL_COMMUNITY)
Admission: RE | Admit: 2022-04-17 | Discharge: 2022-04-17 | Disposition: A | Discharge status: Home / Self Care | Payer: Medicare Other | Source: Ambulatory Visit | Attending: Oncology | Admitting: Oncology

## 2022-04-17 DIAGNOSIS — I2510 Atherosclerotic heart disease of native coronary artery without angina pectoris: Secondary | ICD-10-CM | POA: Diagnosis not present

## 2022-04-17 DIAGNOSIS — C7989 Secondary malignant neoplasm of other specified sites: Secondary | ICD-10-CM

## 2022-04-17 DIAGNOSIS — N2889 Other specified disorders of kidney and ureter: Secondary | ICD-10-CM | POA: Diagnosis not present

## 2022-04-17 DIAGNOSIS — J984 Other disorders of lung: Secondary | ICD-10-CM | POA: Diagnosis not present

## 2022-04-17 DIAGNOSIS — Z85818 Personal history of malignant neoplasm of other sites of lip, oral cavity, and pharynx: Secondary | ICD-10-CM | POA: Diagnosis not present

## 2022-04-17 DIAGNOSIS — C4490 Unspecified malignant neoplasm of skin, unspecified: Secondary | ICD-10-CM | POA: Diagnosis not present

## 2022-04-17 DIAGNOSIS — M47812 Spondylosis without myelopathy or radiculopathy, cervical region: Secondary | ICD-10-CM | POA: Diagnosis not present

## 2022-04-17 DIAGNOSIS — I700 Atherosclerosis of aorta: Secondary | ICD-10-CM | POA: Diagnosis not present

## 2022-04-17 DIAGNOSIS — R911 Solitary pulmonary nodule: Secondary | ICD-10-CM | POA: Diagnosis not present

## 2022-04-17 DIAGNOSIS — K769 Liver disease, unspecified: Secondary | ICD-10-CM | POA: Diagnosis not present

## 2022-04-17 MED ORDER — SODIUM CHLORIDE (PF) 0.9 % IJ SOLN
INTRAMUSCULAR | Status: AC
  Filled 2022-04-17: qty 50

## 2022-04-17 MED ORDER — IOHEXOL 300 MG/ML  SOLN
100.0000 mL | Freq: Once | INTRAMUSCULAR | Status: AC | PRN
  Administered 2022-04-17: 100 mL via INTRAVENOUS

## 2022-04-19 ENCOUNTER — Inpatient Hospital Stay: Payer: Medicare Other | Attending: Oncology

## 2022-04-19 ENCOUNTER — Inpatient Hospital Stay (HOSPITAL_BASED_OUTPATIENT_CLINIC_OR_DEPARTMENT_OTHER): Payer: Medicare Other | Admitting: Oncology

## 2022-04-19 ENCOUNTER — Inpatient Hospital Stay: Payer: Medicare Other

## 2022-04-19 VITALS — BP 142/88 | HR 63 | Temp 97.8°F | Resp 16 | Wt 142.5 lb

## 2022-04-19 VITALS — BP 152/68 | HR 61 | Resp 18

## 2022-04-19 DIAGNOSIS — C44329 Squamous cell carcinoma of skin of other parts of face: Secondary | ICD-10-CM | POA: Insufficient documentation

## 2022-04-19 DIAGNOSIS — Z79899 Other long term (current) drug therapy: Secondary | ICD-10-CM | POA: Diagnosis not present

## 2022-04-19 DIAGNOSIS — C7989 Secondary malignant neoplasm of other specified sites: Secondary | ICD-10-CM

## 2022-04-19 DIAGNOSIS — Z5112 Encounter for antineoplastic immunotherapy: Secondary | ICD-10-CM | POA: Diagnosis not present

## 2022-04-19 LAB — CMP (CANCER CENTER ONLY)
ALT: 13 U/L (ref 0–44)
AST: 16 U/L (ref 15–41)
Albumin: 3.9 g/dL (ref 3.5–5.0)
Alkaline Phosphatase: 97 U/L (ref 38–126)
Anion gap: 3 — ABNORMAL LOW (ref 5–15)
BUN: 11 mg/dL (ref 8–23)
CO2: 32 mmol/L (ref 22–32)
Calcium: 9.7 mg/dL (ref 8.9–10.3)
Chloride: 104 mmol/L (ref 98–111)
Creatinine: 0.82 mg/dL (ref 0.61–1.24)
GFR, Estimated: >60 mL/min (ref >60)
Glucose, Bld: 159 mg/dL — ABNORMAL HIGH (ref 70–99)
Potassium: 3.8 mmol/L (ref 3.5–5.1)
Sodium: 139 mmol/L (ref 135–145)
Total Bilirubin: 0.7 mg/dL (ref 0.3–1.2)
Total Protein: 6.5 g/dL (ref 6.5–8.1)

## 2022-04-19 LAB — TSH: TSH: 2.995 u[IU]/mL (ref 0.350–4.500)

## 2022-04-19 LAB — CBC WITH DIFFERENTIAL (CANCER CENTER ONLY)
Abs Immature Granulocytes: 0.01 10*3/uL (ref 0.00–0.07)
Basophils Absolute: 0 10*3/uL (ref 0.0–0.1)
Basophils Relative: 0 %
Eosinophils Absolute: 0.4 10*3/uL (ref 0.0–0.5)
Eosinophils Relative: 8 %
HCT: 37.4 % — ABNORMAL LOW (ref 39.0–52.0)
Hemoglobin: 13 g/dL (ref 13.0–17.0)
Immature Granulocytes: 0 %
Lymphocytes Relative: 23 %
Lymphs Abs: 1.2 10*3/uL (ref 0.7–4.0)
MCH: 30.3 pg (ref 26.0–34.0)
MCHC: 34.8 g/dL (ref 30.0–36.0)
MCV: 87.2 fL (ref 80.0–100.0)
Monocytes Absolute: 0.4 10*3/uL (ref 0.1–1.0)
Monocytes Relative: 8 %
Neutro Abs: 3.4 10*3/uL (ref 1.7–7.7)
Neutrophils Relative %: 61 %
Platelet Count: 221 10*3/uL (ref 150–400)
RBC: 4.29 MIL/uL (ref 4.22–5.81)
RDW: 13.3 % (ref 11.5–15.5)
WBC Count: 5.5 10*3/uL (ref 4.0–10.5)
nRBC: 0 % (ref 0.0–0.2)

## 2022-04-19 MED ORDER — SODIUM CHLORIDE 0.9 % IV SOLN
350.0000 mg | Freq: Once | INTRAVENOUS | Status: AC
  Administered 2022-04-19: 350 mg via INTRAVENOUS
  Filled 2022-04-19: qty 7

## 2022-04-19 MED ORDER — SODIUM CHLORIDE 0.9 % IV SOLN: Freq: Once | INTRAVENOUS | Status: AC

## 2022-04-19 NOTE — Progress Notes (Signed)
Hematology and Oncology Follow Up Visit  Shaun Mcmillan [id number] 07-Jun-1941 81 y.o. 04/19/2022 8:11 AM Shaun Mcmillan, MDShadad, [name], MD   Principle Diagnosis: 70 year old man with skin cancer with parotid gland involvement diagnosed in 2022.  He developed stage IV squamous cell carcinoma with pulmonary involvement in 2023.   Prior Therapy:  He is status post parotidectomy with skin resection and rotational flap completed by Dr. Constance Holster in April 2022.  Final pathology showed squamous cell carcinoma.  He is status post radiation therapy under the care of Dr. Isidore Moos completing 33 fractions on December 19, 2020 for a total of 66 Gray   Current therapy: Libtayo 350 mg started on August 10, 2021.  He returns for cycle 12 of therapy.  Interim History: [name] returns today for repeat evaluation.  Since the last visit, he reports feeling well without any major complaints.  He has tolerated Libtayo without any recent complications.  He denies any nausea, vomiting or abdominal pain.  He denies any hospitalizations or illnesses.  He denies any skin rashes or lesions.  He denies any pruritus.    Medications: Updated on review. Current Outpatient Medications  Medication Sig Dispense Refill   acetaminophen (TYLENOL) 500 MG tablet Take 1,000 mg by mouth every 8 (eight) hours as needed for moderate pain.     amLODipine (NORVASC) 10 MG tablet TAKE 1 TABLET BY MOUTH DAILY 90 tablet 0   aspirin EC 81 MG tablet Take 1 tablet (81 mg total) by mouth daily. Swallow whole. 30 tablet 11   atorvastatin (LIPITOR) 40 MG tablet TAKE 1 TABLET BY MOUTH DAILY 90 tablet 0   Cholecalciferol (VITAMIN D PO) Take 5,000 Units by mouth daily.     gabapentin (NEURONTIN) 300 MG capsule TAKE 2 CAPSULES BY MOUTH 3 TIMES A DAY 180 capsule 1   latanoprost (XALATAN) 0.005 % ophthalmic solution Place 1 drop into both eyes every morning.     LORazepam (ATIVAN) 1 MG tablet Take 1 tablet (1 mg total) by mouth 3 (three) times  daily as needed for anxiety. 60 tablet 5   Melatonin 5 MG TABS Take 5 mg by mouth at bedtime.     metFORMIN (GLUCOPHAGE) 500 MG tablet TAKE 1 TABLET BY MOUTH TWICE  DAILY WITH A MEAL 180 tablet 0   Multiple Vitamin (MULTIVITAMIN) tablet Take 1 tablet by mouth daily.     prochlorperazine (COMPAZINE) 10 MG tablet Take 1 tablet (10 mg total) by mouth every 6 (six) hours as needed for nausea or vomiting. 30 tablet 0   ramipril (ALTACE) 10 MG capsule TAKE 1 CAPSULE BY MOUTH TWICE  DAILY 180 capsule 0   timolol (BETIMOL) 0.5 % ophthalmic solution Place 1 drop into both eyes daily.     No current facility-administered medications for this visit.     Allergies: No Known Allergies    Physical Exam:      Blood pressure (!) 142/88, pulse 63, temperature 97.8 F (36.6 C), temperature source Oral, resp. rate 16, weight 142 lb 8 oz (64.6 kg), SpO2 100 %.       ECOG: 0   General appearance: Alert, awake without any distress. Head: Atraumatic without abnormalities Oropharynx: Without any thrush or ulcers. Eyes: No scleral icterus. Lymph nodes: No lymphadenopathy noted in the cervical, supraclavicular, or axillary nodes Heart:regular rate and rhythm, without any murmurs or gallops.   Lung: Clear to auscultation without any rhonchi, wheezes or dullness to percussion. Abdomin: Soft, nontender without any shifting dullness or ascites.  Musculoskeletal: No clubbing or cyanosis. Neurological: No motor or sensory deficits. Skin: No rashes or lesions.           Lab Results: Lab Results  Component Value Date   WBC 6.2 03/29/2022   HGB 13.6 03/29/2022   HCT 39.3 03/29/2022   MCV 86.9 03/29/2022   PLT 263 03/29/2022   PSA 1.54 09/05/2021     Chemistry      Component Value Date/Time   NA 143 03/29/2022 0915   K 3.6 03/29/2022 0915   CL 105 03/29/2022 0915   CO2 32 03/29/2022 0915   BUN 12 03/29/2022 0915   CREATININE 0.94 03/29/2022 0915   CREATININE 0.69 (L) 01/08/2021  1553      Component Value Date/Time   CALCIUM 9.4 03/29/2022 0915   ALKPHOS 101 03/29/2022 0915   AST 18 03/29/2022 0915   ALT 16 03/29/2022 0915   BILITOT 0.7 03/29/2022 0915          Impression and Plan:  81 year old man with:  1.  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     He continues to be on Libtayo without any major complaints.  He has experienced radiographic response based on imaging studies in August 2023.  CT scan obtained on April 17, 2022 was completed and the results are currently pending.  Risks and benefits of continuing this treatment for the time being were discussed.  He is agreeable to continue given his overall tolerance and reasonable response.  2.  IV access: Peripheral veins are currently in use without any issues.   3.  Antiemetics: Compazine is available to him without any nausea or vomiting.  4.  Autoimmune complications: I educated him about complication occluding pneumonitis, colitis and thyroid disease.   5.  Follow-up: In 3 weeks for a follow-up.   30  minutes were dedicated to this visit.  The time was spent on reviewing his disease status, reviewing imaging studies and future plan of care review.  Zola Button, MD 11/3/20238:11 AM

## 2022-04-19 NOTE — Patient Instructions (Signed)
[facility]COL[facility]  Discharge Instructions: Thank you for choosing [facility] to provide your oncology and hematology care.   If you have a lab appointment with the Cancer Center, please go directly to the Cancer Center and check in at the registration area.   Wear comfortable clothing and clothing appropriate for easy access to any Portacath or PICC line.   We strive to give you quality time with your provider. You may need to reschedule your appointment if you arrive late (15 or more minutes).  Arriving late affects you and other patients whose appointments are after yours.  Also, if you miss three or more appointments without notifying the office, you may be dismissed from the clinic at the provider's discretion.      For prescription refill requests, have your pharmacy contact our office and allow 72 hours for refills to be completed.    Today you received the following chemotherapy and/or immunotherapy agents: Libtayo      To help prevent nausea and vomiting after your treatment, we encourage you to take your nausea medication as directed.  BELOW ARE SYMPTOMS THAT SHOULD BE REPORTED IMMEDIATELY: *FEVER GREATER THAN 100.4 F (38 C) OR HIGHER *CHILLS OR SWEATING *NAUSEA AND VOMITING THAT IS NOT CONTROLLED WITH YOUR NAUSEA MEDICATION *UNUSUAL SHORTNESS OF BREATH *UNUSUAL BRUISING OR BLEEDING *URINARY PROBLEMS (pain or burning when urinating, or frequent urination) *BOWEL PROBLEMS (unusual diarrhea, constipation, pain near the anus) TENDERNESS IN MOUTH AND THROAT WITH OR WITHOUT PRESENCE OF ULCERS (sore throat, sores in mouth, or a toothache) UNUSUAL RASH, SWELLING OR PAIN  UNUSUAL VAGINAL DISCHARGE OR ITCHING   Items with * indicate a potential emergency and should be followed up as soon as possible or go to the Emergency Department if any problems should occur.  Please show the CHEMOTHERAPY ALERT CARD or IMMUNOTHERAPY ALERT CARD at check-in to  the Emergency Department and triage nurse.  Should you have questions after your visit or need to cancel or reschedule your appointment, please contact [facility]  Dept: [phone]  and follow the prompts.  Office hours are 8:00 a.m. to 4:30 p.m. Monday - Friday. Please note that voicemails left after 4:00 p.m. may not be returned until the following business day.  We are closed weekends and major holidays. You have access to a nurse at all times for urgent questions. Please call the main number to the clinic Dept: [phone] and follow the prompts.   For any non-urgent questions, you may also contact your provider using MyChart. We now offer e-Visits for anyone 18 and older to request care online for non-urgent symptoms. For details visit [email or web address].   Also download the MyChart app! Go to the app store, search "MyChart", open the app, select , and log in with your MyChart username and password.  Masks are optional in the cancer centers. If you would like for your care team to wear a mask while they are taking care of you, please let them know. You may have one support person who is at least 81 years old accompany you for your appointments. 

## 2022-04-20 LAB — T4: T4, Total: 5.8 ug/dL (ref 4.5–12.0)

## 2022-04-22 ENCOUNTER — Telehealth: Payer: Self-pay

## 2022-04-22 NOTE — Telephone Encounter (Signed)
Pt called back and he is really concerned if the reading for his CT scan is correct.  The one from August was read in CM and this one is read in MM.  He would like for you to follow up on the accuracy of this reading

## 2022-04-22 NOTE — Telephone Encounter (Signed)
T/C from pt stating he received an e-mail on 10/31 with his CT scan results and they were different from the 04/17/22 results. The 10/31 email has disappeared.    Advised pt he must have received someone else's results since he had not had the test yet.  Advised pt to disregard the 10/31 results.

## 2022-04-23 NOTE — Telephone Encounter (Signed)
Pt advised November reading is accurate.  He will discuss this further at his 11/24 appt.

## 2022-05-03 DIAGNOSIS — Z961 Presence of intraocular lens: Secondary | ICD-10-CM | POA: Diagnosis not present

## 2022-05-10 ENCOUNTER — Inpatient Hospital Stay: Payer: Medicare Other

## 2022-05-10 ENCOUNTER — Inpatient Hospital Stay (HOSPITAL_BASED_OUTPATIENT_CLINIC_OR_DEPARTMENT_OTHER): Payer: Medicare Other | Admitting: Oncology

## 2022-05-10 ENCOUNTER — Other Ambulatory Visit: Payer: Self-pay

## 2022-05-10 VITALS — BP 153/83 | HR 71 | Temp 97.3°F | Resp 15 | Wt 141.6 lb

## 2022-05-10 DIAGNOSIS — Z5112 Encounter for antineoplastic immunotherapy: Secondary | ICD-10-CM | POA: Diagnosis not present

## 2022-05-10 DIAGNOSIS — C7989 Secondary malignant neoplasm of other specified sites: Secondary | ICD-10-CM | POA: Diagnosis not present

## 2022-05-10 DIAGNOSIS — Z79899 Other long term (current) drug therapy: Secondary | ICD-10-CM | POA: Diagnosis not present

## 2022-05-10 DIAGNOSIS — C44329 Squamous cell carcinoma of skin of other parts of face: Secondary | ICD-10-CM | POA: Diagnosis not present

## 2022-05-10 LAB — CBC WITH DIFFERENTIAL (CANCER CENTER ONLY)
Abs Immature Granulocytes: 0.01 10*3/uL (ref 0.00–0.07)
Basophils Absolute: 0 10*3/uL (ref 0.0–0.1)
Basophils Relative: 1 %
Eosinophils Absolute: 0.4 10*3/uL (ref 0.0–0.5)
Eosinophils Relative: 7 %
HCT: 40.4 % (ref 39.0–52.0)
Hemoglobin: 13.8 g/dL (ref 13.0–17.0)
Immature Granulocytes: 0 %
Lymphocytes Relative: 17 %
Lymphs Abs: 1 10*3/uL (ref 0.7–4.0)
MCH: 30.3 pg (ref 26.0–34.0)
MCHC: 34.2 g/dL (ref 30.0–36.0)
MCV: 88.6 fL (ref 80.0–100.0)
Monocytes Absolute: 0.6 10*3/uL (ref 0.1–1.0)
Monocytes Relative: 9 %
Neutro Abs: 4 10*3/uL (ref 1.7–7.7)
Neutrophils Relative %: 66 %
Platelet Count: 202 10*3/uL (ref 150–400)
RBC: 4.56 MIL/uL (ref 4.22–5.81)
RDW: 13.6 % (ref 11.5–15.5)
WBC Count: 6 10*3/uL (ref 4.0–10.5)
nRBC: 0 % (ref 0.0–0.2)

## 2022-05-10 LAB — CMP (CANCER CENTER ONLY)
ALT: 15 U/L (ref 0–44)
AST: 16 U/L (ref 15–41)
Albumin: 4.3 g/dL (ref 3.5–5.0)
Alkaline Phosphatase: 103 U/L (ref 38–126)
Anion gap: 5 (ref 5–15)
BUN: 13 mg/dL (ref 8–23)
CO2: 31 mmol/L (ref 22–32)
Calcium: 9.7 mg/dL (ref 8.9–10.3)
Chloride: 107 mmol/L (ref 98–111)
Creatinine: 0.9 mg/dL (ref 0.61–1.24)
GFR, Estimated: >60 mL/min (ref >60)
Glucose, Bld: 169 mg/dL — ABNORMAL HIGH (ref 70–99)
Potassium: 3.6 mmol/L (ref 3.5–5.1)
Sodium: 143 mmol/L (ref 135–145)
Total Bilirubin: 0.6 mg/dL (ref 0.3–1.2)
Total Protein: 6.8 g/dL (ref 6.5–8.1)

## 2022-05-10 LAB — TSH: TSH: 2.147 u[IU]/mL (ref 0.350–4.500)

## 2022-05-10 MED ORDER — SODIUM CHLORIDE 0.9 % IV SOLN
350.0000 mg | Freq: Once | INTRAVENOUS | Status: AC
  Administered 2022-05-10: 350 mg via INTRAVENOUS
  Filled 2022-05-10: qty 7

## 2022-05-10 MED ORDER — SODIUM CHLORIDE 0.9 % IV SOLN: Freq: Once | INTRAVENOUS | Status: AC

## 2022-05-10 NOTE — Patient Instructions (Signed)
[facility]ICAL [facility]COLOG[facility]  Discharge Instructions: Thank you for choosing [facility] to provide your oncology and hematology care.   If you have a lab appointment with the Cancer Center, please go directly to the Cancer Center and check in at the registration area.   Wear comfortable clothing and clothing appropriate for easy access to any Portacath or PICC line.   We strive to give you quality time with your provider. You may need to reschedule your appointment if you arrive late (15 or more minutes).  Arriving late affects you and other patients whose appointments are after yours.  Also, if you miss three or more appointments without notifying the office, you may be dismissed from the clinic at the provider's discretion.      For prescription refill requests, have your pharmacy contact our office and allow 72 hours for refills to be completed.    Today you received the following chemotherapy and/or immunotherapy agents: Libtayo      To help prevent nausea and vomiting after your treatment, we encourage you to take your nausea medication as directed.  BELOW ARE SYMPTOMS THAT SHOULD BE REPORTED IMMEDIATELY: *FEVER GREATER THAN 100.4 F (38 C) OR HIGHER *CHILLS OR SWEATING *NAUSEA AND VOMITING THAT IS NOT CONTROLLED WITH YOUR NAUSEA MEDICATION *UNUSUAL SHORTNESS OF BREATH *UNUSUAL BRUISING OR BLEEDING *URINARY PROBLEMS (pain or burning when urinating, or frequent urination) *BOWEL PROBLEMS (unusual diarrhea, constipation, pain near the anus) TENDERNESS IN MOUTH AND THROAT WITH OR WITHOUT PRESENCE OF ULCERS (sore throat, sores in mouth, or a toothache) UNUSUAL RASH, SWELLING OR PAIN  UNUSUAL VAGINAL DISCHARGE OR ITCHING   Items with * indicate a potential emergency and should be followed up as soon as possible or go to the Emergency Department if any problems should occur.  Please show the CHEMOTHERAPY ALERT CARD or IMMUNOTHERAPY ALERT CARD at check-in to  the Emergency Department and triage nurse.  Should you have questions after your visit or need to cancel or reschedule your appointment, please contact [facility]  Dept: [phone]  and follow the prompts.  Office hours are 8:00 a.m. to 4:30 p.m. Monday - Friday. Please note that voicemails left after 4:00 p.m. may not be returned until the following business day.  We are closed weekends and major holidays. You have access to a nurse at all times for urgent questions. Please call the main number to the clinic Dept: [phone] and follow the prompts.   For any non-urgent questions, you may also contact your provider using MyChart. We now offer e-Visits for anyone 18 and older to request care online for non-urgent symptoms. For details visit [email or web address].   Also download the MyChart app! Go to the app store, search "MyChart", open the app, select [facility], and log in with your MyChart username and password.  Masks are optional in the cancer centers. If you would like for your care team to wear a mask while they are taking care of you, please let them know. You may have one support person who is at least 81 years old accompany you for your appointments. 

## 2022-05-10 NOTE — Progress Notes (Signed)
WallHematology and Oncology Follow Up Visit  Robertson Colclough [id number] [birth year] 81 y.o. 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  He presented with localized disease in 2022 over the parotid gland.  Prior Therapy:  He is status post parotidectomy with skin resection and rotational flap completed by Dr. Constance Holster in April 2022.  Final pathology showed squamous cell carcinoma.  He is status post radiation therapy under the care of Dr. Isidore Moos completing 33 fractions on December 19, 2020 for a total of 66 Gray   Current therapy: Libtayo 350 mg started on August 10, 2021.  He returns for cycle 13 of therapy.  Interim History: [name] is here for a follow-up visit.  Since last visit, he reports feeling well without any major complaints.  He denies any nausea vomiting or abdominal pain.  He denies any skin rashes or lesions.  He denies any hospitalizations or illnesses.  He continues to tolerate Libtayo without any complaints.    Medications: Reviewed without changes. Current Outpatient Medications  Medication Sig Dispense Refill   acetaminophen (TYLENOL) 500 MG tablet Take 1,000 mg by mouth every 8 (eight) hours as needed for moderate pain.     amLODipine (NORVASC) 10 MG tablet TAKE 1 TABLET BY MOUTH DAILY 90 tablet 0   aspirin EC 81 MG tablet Take 1 tablet (81 mg total) by mouth daily. Swallow whole. 30 tablet 11   atorvastatin (LIPITOR) 40 MG tablet TAKE 1 TABLET BY MOUTH DAILY 90 tablet 0   Cholecalciferol (VITAMIN D PO) Take 5,000 Units by mouth daily.     gabapentin (NEURONTIN) 300 MG capsule TAKE 2 CAPSULES BY MOUTH 3 TIMES A DAY 180 capsule 1   latanoprost (XALATAN) 0.005 % ophthalmic solution Place 1 drop into both eyes every morning.     LORazepam (ATIVAN) 1 MG tablet Take 1 tablet (1 mg total) by mouth 3 (three) times daily as  needed for anxiety. 60 tablet 5   Melatonin 5 MG TABS Take 5 mg by mouth at bedtime.     metFORMIN (GLUCOPHAGE) 500 MG tablet TAKE 1 TABLET BY MOUTH TWICE  DAILY WITH A MEAL 180 tablet 0   Multiple Vitamin (MULTIVITAMIN) tablet Take 1 tablet by mouth daily.     prochlorperazine (COMPAZINE) 10 MG tablet Take 1 tablet (10 mg total) by mouth every 6 (six) hours as needed for nausea or vomiting. 30 tablet 0   ramipril (ALTACE) 10 MG capsule TAKE 1 CAPSULE BY MOUTH TWICE  DAILY 180 capsule 0   timolol (BETIMOL) 0.5 % ophthalmic solution Place 1 drop into both eyes daily.     No current facility-administered medications for this visit.     Allergies: No Known Allergies    Physical Exam:     Blood pressure (!) 153/83, pulse 71, temperature (!) 97.3 F (36.3 C), temperature source Temporal, resp. rate 15, weight 141 lb 9.6 oz (64.2 kg), SpO2 97 %.         ECOG: 0     General appearance: Comfortable appearing without any discomfort Head: Normocephalic without any trauma Oropharynx: Mucous membranes are moist and pink without any thrush or ulcers. Eyes: Pupils are equal and round reactive to light. Lymph nodes: No cervical, supraclavicular, inguinal or axillary lymphadenopathy.   Heart:regular rate and rhythm.  S1 and S2 without leg edema. Lung: Clear without any rhonchi or wheezes.  No dullness to percussion. Abdomin: Soft, nontender, nondistended with good bowel sounds.  No hepatosplenomegaly. Musculoskeletal: No joint deformity or effusion.  Full range of motion noted. Neurological: No deficits noted on motor, sensory and deep tendon reflex exam. Skin: No petechial rash or dryness.  Appeared moist.            Lab Results: Lab Results  Component Value Date   WBC 6.0 05/10/2022   HGB 13.8 05/10/2022   HCT 40.4 05/10/2022   MCV 88.6 05/10/2022   PLT 202 05/10/2022   PSA 1.54 09/05/2021     Chemistry      Component Value Date/Time   NA 139 04/19/2022 0818    K 3.8 04/19/2022 0818   CL 104 04/19/2022 0818   CO2 32 04/19/2022 0818   BUN 11 04/19/2022 0818   CREATININE 0.82 04/19/2022 0818   CREATININE 0.69 (L) 01/08/2021 1553      Component Value Date/Time   CALCIUM 9.7 04/19/2022 0818   ALKPHOS 97 04/19/2022 0818   AST 16 04/19/2022 0818   ALT 13 04/19/2022 0818   BILITOT 0.7 04/19/2022 0818          Impression and Plan:  81 year old man with:  1.  Skin cancer diagnosed in 2022.  He developed stage IV squamous cell carcinoma with pulmonary involvement in 2023.     The natural course of this disease was reviewed and treatment choices were discussed.  He continues to tolerate Libtayo with reasonable response to therapy based on imaging studies obtained in November 2023.  These imaging studies were personally reviewed and discussed with the patient and his wife again.  He has very little residual disease indicating a excellent response to therapy.  The duration of therapy was discussed at this time as recommended continuing treatment for the time being and repeat imaging studies in January  2.  IV access: Peripheral veins are currently in use without any issues.   3.  Antiemetics: Compazine is available to him without any nausea or vomiting.  4.  Autoimmune complications: I educated him about complication occluding pneumonitis, colitis and thyroid disease.   5.  Follow-up: In 3 weeks for a follow-up.   30  minutes were dedicated to this visit.  The time was spent on reviewing his disease status, reviewing imaging studies and future plan of care review.  Zola Button, MD 11/24/20239:39 AM

## 2022-05-11 LAB — T4: T4, Total: 6.2 ug/dL (ref 4.5–12.0)

## 2022-05-14 DIAGNOSIS — D225 Melanocytic nevi of trunk: Secondary | ICD-10-CM | POA: Diagnosis not present

## 2022-05-14 DIAGNOSIS — L821 Other seborrheic keratosis: Secondary | ICD-10-CM | POA: Diagnosis not present

## 2022-05-14 DIAGNOSIS — D0422 Carcinoma in situ of skin of left ear and external auricular canal: Secondary | ICD-10-CM | POA: Diagnosis not present

## 2022-05-14 DIAGNOSIS — D0439 Carcinoma in situ of skin of other parts of face: Secondary | ICD-10-CM | POA: Diagnosis not present

## 2022-05-14 DIAGNOSIS — Z85828 Personal history of other malignant neoplasm of skin: Secondary | ICD-10-CM | POA: Diagnosis not present

## 2022-05-26 ENCOUNTER — Other Ambulatory Visit: Payer: Self-pay | Admitting: Family Medicine

## 2022-05-31 ENCOUNTER — Inpatient Hospital Stay: Payer: Medicare Other | Attending: Oncology

## 2022-05-31 ENCOUNTER — Inpatient Hospital Stay (HOSPITAL_BASED_OUTPATIENT_CLINIC_OR_DEPARTMENT_OTHER): Payer: Medicare Other | Admitting: Oncology

## 2022-05-31 ENCOUNTER — Inpatient Hospital Stay: Payer: Medicare Other

## 2022-05-31 VITALS — BP 168/80 | HR 69 | Temp 97.9°F | Resp 16 | Wt 140.1 lb

## 2022-05-31 DIAGNOSIS — C7989 Secondary malignant neoplasm of other specified sites: Secondary | ICD-10-CM

## 2022-05-31 DIAGNOSIS — C44329 Squamous cell carcinoma of skin of other parts of face: Secondary | ICD-10-CM | POA: Diagnosis not present

## 2022-05-31 DIAGNOSIS — Z79899 Other long term (current) drug therapy: Secondary | ICD-10-CM | POA: Diagnosis not present

## 2022-05-31 DIAGNOSIS — Z5112 Encounter for antineoplastic immunotherapy: Secondary | ICD-10-CM | POA: Insufficient documentation

## 2022-05-31 LAB — CMP (CANCER CENTER ONLY)
ALT: 13 U/L (ref 0–44)
AST: 15 U/L (ref 15–41)
Albumin: 4.1 g/dL (ref 3.5–5.0)
Alkaline Phosphatase: 102 U/L (ref 38–126)
Anion gap: 6 (ref 5–15)
BUN: 12 mg/dL (ref 8–23)
CO2: 31 mmol/L (ref 22–32)
Calcium: 10 mg/dL (ref 8.9–10.3)
Chloride: 104 mmol/L (ref 98–111)
Creatinine: 0.81 mg/dL (ref 0.61–1.24)
GFR, Estimated: >60 mL/min (ref >60)
Glucose, Bld: 136 mg/dL — ABNORMAL HIGH (ref 70–99)
Potassium: 4 mmol/L (ref 3.5–5.1)
Sodium: 141 mmol/L (ref 135–145)
Total Bilirubin: 0.8 mg/dL (ref 0.3–1.2)
Total Protein: 6.4 g/dL — ABNORMAL LOW (ref 6.5–8.1)

## 2022-05-31 LAB — CBC WITH DIFFERENTIAL (CANCER CENTER ONLY)
Abs Immature Granulocytes: 0.01 10*3/uL (ref 0.00–0.07)
Basophils Absolute: 0 10*3/uL (ref 0.0–0.1)
Basophils Relative: 1 %
Eosinophils Absolute: 0.5 10*3/uL (ref 0.0–0.5)
Eosinophils Relative: 8 %
HCT: 39.9 % (ref 39.0–52.0)
Hemoglobin: 13.7 g/dL (ref 13.0–17.0)
Immature Granulocytes: 0 %
Lymphocytes Relative: 21 %
Lymphs Abs: 1.3 10*3/uL (ref 0.7–4.0)
MCH: 30 pg (ref 26.0–34.0)
MCHC: 34.3 g/dL (ref 30.0–36.0)
MCV: 87.5 fL (ref 80.0–100.0)
Monocytes Absolute: 0.4 10*3/uL (ref 0.1–1.0)
Monocytes Relative: 7 %
Neutro Abs: 3.8 10*3/uL (ref 1.7–7.7)
Neutrophils Relative %: 63 %
Platelet Count: 217 10*3/uL (ref 150–400)
RBC: 4.56 MIL/uL (ref 4.22–5.81)
RDW: 13.4 % (ref 11.5–15.5)
WBC Count: 6 10*3/uL (ref 4.0–10.5)
nRBC: 0 % (ref 0.0–0.2)

## 2022-05-31 LAB — TSH: TSH: 2.497 u[IU]/mL (ref 0.350–4.500)

## 2022-05-31 MED ORDER — SODIUM CHLORIDE 0.9 % IV SOLN: Freq: Once | INTRAVENOUS | Status: AC

## 2022-05-31 MED ORDER — SODIUM CHLORIDE 0.9 % IV SOLN
350.0000 mg | Freq: Once | INTRAVENOUS | Status: AC
  Administered 2022-05-31: 350 mg via INTRAVENOUS
  Filled 2022-05-31: qty 7

## 2022-05-31 NOTE — Patient Instructions (Signed)
[facility] MEDICAL [facility]COLOGY  Discharge Instructions: Thank you for choosing [facility] to provide your oncology and hematology care.   If you have a lab appointment with the Cancer Center, please go directly to the Cancer Center and check in at the registration area.   Wear comfortable clothing and clothing appropriate for easy access to any Portacath or PICC line.   We strive to give you quality time with your provider. You may need to reschedule your appointment if you arrive late (15 or more minutes).  Arriving late affects you and other patients whose appointments are after yours.  Also, if you miss three or more appointments without notifying the office, you may be dismissed from the clinic at the provider's discretion.      For prescription refill requests, have your pharmacy contact our office and allow 72 hours for refills to be completed.    Today you received the following chemotherapy and/or immunotherapy agents: Libtayo      To help prevent nausea and vomiting after your treatment, we encourage you to take your nausea medication as directed.  BELOW ARE SYMPTOMS THAT SHOULD BE REPORTED IMMEDIATELY: *FEVER GREATER THAN 100.4 F (38 C) OR HIGHER *CHILLS OR SWEATING *NAUSEA AND VOMITING THAT IS NOT CONTROLLED WITH YOUR NAUSEA MEDICATION *UNUSUAL SHORTNESS OF BREATH *UNUSUAL BRUISING OR BLEEDING *URINARY PROBLEMS (pain or burning when urinating, or frequent urination) *BOWEL PROBLEMS (unusual diarrhea, constipation, pain near the anus) TENDERNESS IN MOUTH AND THROAT WITH OR WITHOUT PRESENCE OF ULCERS (sore throat, sores in mouth, or a toothache) UNUSUAL RASH, SWELLING OR PAIN  UNUSUAL VAGINAL DISCHARGE OR ITCHING   Items with * indicate a potential emergency and should be followed up as soon as possible or go to the Emergency Department if any problems should occur.  Please show the CHEMOTHERAPY ALERT CARD or IMMUNOTHERAPY ALERT CARD at check-in to  the Emergency Department and triage nurse.  Should you have questions after your visit or need to cancel or reschedule your appointment, please contact [facility]  Dept: [phone]  and follow the prompts.  Office hours are 8:00 a.m. to 4:30 p.m. Monday - Friday. Please note that voicemails left after 4:00 p.m. may not be returned until the following business day.  We are closed weekends and major holidays. You have access to a nurse at all times for urgent questions. Please call the main number to the clinic Dept: [phone] and follow the prompts.   For any non-urgent questions, you may also contact your provider using MyChart. We now offer e-Visits for anyone 18 and older to request care online for non-urgent symptoms. For details visit [email or web address].   Also download the MyChart app! Go to the app store, search "MyChart", open the app, select [facility], and log in with your MyChart username and password.  Masks are optional in the cancer centers. If you would like for your care team to wear a mask while they are taking care of you, please let them know. You may have one support person who is at least 81 years old accompany you for your appointments. 

## 2022-05-31 NOTE — Progress Notes (Signed)
WallHematology and Oncology Follow Up Visit  Shaun Mcmillan [id number] 08-Apr-1941 81 y.o. 05/31/2022 8:04 AM Shaun Mcmillan, MDShadad, [name], MD   Principle Diagnosis: 86 year old man with skin cancer involving the parotid gland diagnosed in April 2022.  He developed stage IV squamous cell carcinoma with pulmonary involvement diagnosed in 2023.   Prior Therapy:  He is status post parotidectomy with skin resection and rotational flap completed by Dr. Constance Holster in April 2022.  Final pathology showed squamous cell carcinoma.  He is status post radiation therapy under the care of Dr. Isidore Moos completing 33 fractions on December 19, 2020 for a total of 66 Gray   Current therapy: Libtayo 350 mg started on August 10, 2021.  He returns for cycle 14 of therapy.  Interim History: Shaun Mcmillan is here for a follow-up visit.  Since last visit, he reports no major changes in his health.  He continues to tolerate current treatment without any issues.  He denies any nausea vomiting or abdominal pain.  He denies any hospitalizations or illnesses.  He denies skin rashes or lesions.  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 Ronnald Ramp.    Medications: Updated on review. Current Outpatient Medications  Medication Sig Dispense Refill   acetaminophen (TYLENOL) 500 MG tablet Take 1,000 mg by mouth every 8 (eight) hours as needed for moderate pain.     amLODipine (NORVASC) 10 MG tablet TAKE 1 TABLET BY MOUTH DAILY 90 tablet 3   aspirin EC 81 MG tablet Take 1 tablet (81 mg total) by mouth daily. Swallow whole. 30 tablet 11   atorvastatin (LIPITOR) 40 MG tablet TAKE 1 TABLET BY MOUTH DAILY 90 tablet 0   Cholecalciferol (VITAMIN D PO) Take 5,000 Units by mouth daily.     gabapentin (NEURONTIN) 300 MG capsule TAKE 2 CAPSULES BY MOUTH 3 TIMES A DAY 180 capsule 1   latanoprost (XALATAN) 0.005 % ophthalmic solution Place 1 drop into both eyes every morning.     LORazepam (ATIVAN) 1 MG  tablet Take 1 tablet (1 mg total) by mouth 3 (three) times daily as needed for anxiety. 60 tablet 5   Melatonin 5 MG TABS Take 5 mg by mouth at bedtime.     metFORMIN (GLUCOPHAGE) 500 MG tablet TAKE 1 TABLET BY MOUTH TWICE  DAILY WITH A MEAL 180 tablet 0   Multiple Vitamin (MULTIVITAMIN) tablet Take 1 tablet by mouth daily.     prochlorperazine (COMPAZINE) 10 MG tablet Take 1 tablet (10 mg total) by mouth every 6 (six) hours as needed for nausea or vomiting. 30 tablet 0   ramipril (ALTACE) 10 MG capsule TAKE 1 CAPSULE BY MOUTH TWICE  DAILY 180 capsule 0   timolol (BETIMOL) 0.5 % ophthalmic solution Place 1 drop into both eyes daily.     No current facility-administered medications for this visit.     Allergies: No Known Allergies    Physical Exam:       Blood pressure (!) 168/80, pulse 69, temperature 97.9 F (36.6 C), temperature source Temporal, resp. rate 16, weight 140 lb 1.6 oz (63.5 kg), SpO2 100 %.        ECOG: 0   General appearance: Alert, awake without any distress. Head: Atraumatic without abnormalities Oropharynx: Without any thrush or ulcers. Eyes: No scleral icterus. Lymph nodes: No lymphadenopathy noted in the cervical, supraclavicular, or axillary nodes Heart:regular rate and rhythm, without any murmurs or gallops.   Lung: Clear to auscultation  without any rhonchi, wheezes or dullness to percussion. Abdomin: Soft, nontender without any shifting dullness or ascites. Musculoskeletal: No clubbing or cyanosis. Neurological: No motor or sensory deficits. Skin: No rashes or lesions.           Lab Results: Lab Results  Component Value Date   WBC 6.0 05/10/2022   HGB 13.8 05/10/2022   HCT 40.4 05/10/2022   MCV 88.6 05/10/2022   PLT 202 05/10/2022   PSA 1.54 09/05/2021     Chemistry      Component Value Date/Time   NA 143 05/10/2022 0918   K 3.6 05/10/2022 0918   CL 107 05/10/2022 0918   CO2 31 05/10/2022 0918   BUN 13 05/10/2022 0918    CREATININE 0.90 05/10/2022 0918   CREATININE 0.69 (L) 01/08/2021 1553      Component Value Date/Time   CALCIUM 9.7 05/10/2022 0918   ALKPHOS 103 05/10/2022 0918   AST 16 05/10/2022 0918   ALT 15 05/10/2022 0918   BILITOT 0.6 05/10/2022 0918          Impression and Plan:  81 year old man with:  1.  Stage IV squamous cell carcinoma of the skin with pulmonary involvement documented in 2023.  He initially presented with localized disease including the parotid gland in 2022.   He continues to have excellent response to Libtayo without any major complications.  Risks and benefits of continuing this treatment were discussed.  Therapy interruption when possible treatment break or discontinuation were considered.  Risks and benefits of proceeding with treatment throughout the month of January were reviewed.  I recommend updating his staging scans before the next visit and possible consideration for therapy discontinuation.  2.  IV access: No issues reported with peripheral veins at this time.   3.  Antiemetics: No nausea or vomiting reported at this time.  Compazine is available to him.  4.  Autoimmune complications: He has not experienced any complications at this time.  These include otitis, colitis and thyroid disease.  5.  Dermatology surveillance: He continues to follow with Dr. Ronnald Ramp and up-to-date at this time.  He is susceptible to developing other skin malignancy.   6.  Follow-up: He will return in 3 weeks for follow-up.   30  minutes were spent on this visit.  The time was dedicated to updating disease status, treatment choices and outlining future plan of care review.  Shaun Button, MD 12/15/20238:04 AM

## 2022-06-01 LAB — T4: T4, Total: 6.2 ug/dL (ref 4.5–12.0)

## 2022-06-19 ENCOUNTER — Ambulatory Visit (HOSPITAL_COMMUNITY)
Admission: RE | Admit: 2022-06-19 | Discharge: 2022-06-19 | Disposition: A | Discharge status: Home / Self Care | Payer: Medicare Other | Source: Ambulatory Visit | Attending: Oncology | Admitting: Oncology

## 2022-06-19 ENCOUNTER — Telehealth: Payer: Self-pay | Admitting: Oncology

## 2022-06-19 DIAGNOSIS — M47812 Spondylosis without myelopathy or radiculopathy, cervical region: Secondary | ICD-10-CM | POA: Diagnosis not present

## 2022-06-19 DIAGNOSIS — C7989 Secondary malignant neoplasm of other specified sites: Secondary | ICD-10-CM | POA: Diagnosis not present

## 2022-06-19 DIAGNOSIS — N289 Disorder of kidney and ureter, unspecified: Secondary | ICD-10-CM | POA: Diagnosis not present

## 2022-06-19 DIAGNOSIS — Z9889 Other specified postprocedural states: Secondary | ICD-10-CM | POA: Diagnosis not present

## 2022-06-19 DIAGNOSIS — I6523 Occlusion and stenosis of bilateral carotid arteries: Secondary | ICD-10-CM | POA: Diagnosis not present

## 2022-06-19 DIAGNOSIS — C4492 Squamous cell carcinoma of skin, unspecified: Secondary | ICD-10-CM | POA: Diagnosis not present

## 2022-06-19 DIAGNOSIS — C07 Malignant neoplasm of parotid gland: Secondary | ICD-10-CM | POA: Diagnosis not present

## 2022-06-19 MED ORDER — IOHEXOL 300 MG/ML  SOLN
100.0000 mL | Freq: Once | INTRAMUSCULAR | Status: AC | PRN
  Administered 2022-06-19: 100 mL via INTRAVENOUS

## 2022-06-19 NOTE — Telephone Encounter (Signed)
Called patient regarding upcoming January appointments, patient is notified. 

## 2022-06-21 ENCOUNTER — Inpatient Hospital Stay (HOSPITAL_BASED_OUTPATIENT_CLINIC_OR_DEPARTMENT_OTHER): Payer: Medicare Other | Admitting: Oncology

## 2022-06-21 ENCOUNTER — Inpatient Hospital Stay: Payer: Medicare Other | Attending: Oncology

## 2022-06-21 ENCOUNTER — Inpatient Hospital Stay: Payer: Medicare Other

## 2022-06-21 VITALS — BP 141/66 | HR 55 | Resp 17

## 2022-06-21 DIAGNOSIS — Z79899 Other long term (current) drug therapy: Secondary | ICD-10-CM | POA: Insufficient documentation

## 2022-06-21 DIAGNOSIS — C7989 Secondary malignant neoplasm of other specified sites: Secondary | ICD-10-CM

## 2022-06-21 DIAGNOSIS — C44329 Squamous cell carcinoma of skin of other parts of face: Secondary | ICD-10-CM | POA: Insufficient documentation

## 2022-06-21 DIAGNOSIS — C7800 Secondary malignant neoplasm of unspecified lung: Secondary | ICD-10-CM | POA: Diagnosis not present

## 2022-06-21 DIAGNOSIS — Z5112 Encounter for antineoplastic immunotherapy: Secondary | ICD-10-CM | POA: Insufficient documentation

## 2022-06-21 LAB — CMP (CANCER CENTER ONLY)
ALT: 20 U/L (ref 0–44)
AST: 23 U/L (ref 15–41)
Albumin: 4.3 g/dL (ref 3.5–5.0)
Alkaline Phosphatase: 97 U/L (ref 38–126)
Anion gap: 7 (ref 5–15)
BUN: 12 mg/dL (ref 8–23)
CO2: 30 mmol/L (ref 22–32)
Calcium: 9.7 mg/dL (ref 8.9–10.3)
Chloride: 104 mmol/L (ref 98–111)
Creatinine: 0.89 mg/dL (ref 0.61–1.24)
GFR, Estimated: >60 mL/min (ref >60)
Glucose, Bld: 117 mg/dL — ABNORMAL HIGH (ref 70–99)
Potassium: 3.5 mmol/L (ref 3.5–5.1)
Sodium: 141 mmol/L (ref 135–145)
Total Bilirubin: 0.7 mg/dL (ref 0.3–1.2)
Total Protein: 6.4 g/dL — ABNORMAL LOW (ref 6.5–8.1)

## 2022-06-21 LAB — CBC WITH DIFFERENTIAL (CANCER CENTER ONLY)
Abs Immature Granulocytes: 0.01 10*3/uL (ref 0.00–0.07)
Basophils Absolute: 0 10*3/uL (ref 0.0–0.1)
Basophils Relative: 0 %
Eosinophils Absolute: 0.4 10*3/uL (ref 0.0–0.5)
Eosinophils Relative: 8 %
HCT: 40.1 % (ref 39.0–52.0)
Hemoglobin: 13.6 g/dL (ref 13.0–17.0)
Immature Granulocytes: 0 %
Lymphocytes Relative: 21 %
Lymphs Abs: 1.1 10*3/uL (ref 0.7–4.0)
MCH: 30.2 pg (ref 26.0–34.0)
MCHC: 33.9 g/dL (ref 30.0–36.0)
MCV: 88.9 fL (ref 80.0–100.0)
Monocytes Absolute: 0.5 10*3/uL (ref 0.1–1.0)
Monocytes Relative: 9 %
Neutro Abs: 3.4 10*3/uL (ref 1.7–7.7)
Neutrophils Relative %: 62 %
Platelet Count: 228 10*3/uL (ref 150–400)
RBC: 4.51 MIL/uL (ref 4.22–5.81)
RDW: 13.4 % (ref 11.5–15.5)
WBC Count: 5.4 10*3/uL (ref 4.0–10.5)
nRBC: 0 % (ref 0.0–0.2)

## 2022-06-21 LAB — TSH: TSH: 2.791 u[IU]/mL (ref 0.350–4.500)

## 2022-06-21 MED ORDER — SODIUM CHLORIDE 0.9 % IV SOLN: Freq: Once | INTRAVENOUS | Status: AC

## 2022-06-21 MED ORDER — SODIUM CHLORIDE 0.9 % IV SOLN
350.0000 mg | Freq: Once | INTRAVENOUS | Status: AC
  Administered 2022-06-21: 350 mg via INTRAVENOUS
  Filled 2022-06-21: qty 7

## 2022-06-21 NOTE — Progress Notes (Signed)
WallHematology and Oncology Follow Up Visit  Shaun Mcmillan [id number] 01/24/1941 82 y.o. 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 He initially presented with skin cancer around the parotid gland in April 2022.    Prior Therapy:  He is status post parotidectomy with skin resection and rotational flap completed by Dr. Constance Holster in April 2022.  Final pathology showed squamous cell carcinoma.  He is status post radiation therapy under the care of Dr. Isidore Moos completing 33 fractions on December 19, 2020 for a total of 66 Gray   Current therapy: Libtayo 350 mg started on August 10, 2021.  He is status post 14 cycles of therapy.  Interim History: Mr. [name] presents today for a follow-up evaluation.  Since the last visit, he reports no major changes in his health.  He continues to have issues with dry mouth related to Libtayo but no other complaints.  He denies any nausea, vomiting or abdominal pain.  He denies any skin rashes or lesions.  He denies any constitutional symptoms.  His appetite remains reasonable although he is lost some weight.   Medications: Reviewed without changes. Current Outpatient Medications  Medication Sig Dispense Refill   acetaminophen (TYLENOL) 500 MG tablet Take 1,000 mg by mouth every 8 (eight) hours as needed for moderate pain.     amLODipine (NORVASC) 10 MG tablet TAKE 1 TABLET BY MOUTH DAILY 90 tablet 3   aspirin EC 81 MG tablet Take 1 tablet (81 mg total) by mouth daily. Swallow whole. 30 tablet 11   atorvastatin (LIPITOR) 40 MG tablet TAKE 1 TABLET BY MOUTH DAILY 90 tablet 0   Cholecalciferol (VITAMIN D PO) Take 5,000 Units by mouth daily.     gabapentin (NEURONTIN) 300 MG capsule TAKE 2 CAPSULES BY MOUTH 3 TIMES A DAY 180 capsule 1   latanoprost (XALATAN) 0.005 % ophthalmic solution Place 1 drop into both eyes every  morning.     LORazepam (ATIVAN) 1 MG tablet Take 1 tablet (1 mg total) by mouth 3 (three) times daily as needed for anxiety. 60 tablet 5   Melatonin 5 MG TABS Take 5 mg by mouth at bedtime.     metFORMIN (GLUCOPHAGE) 500 MG tablet TAKE 1 TABLET BY MOUTH TWICE  DAILY WITH A MEAL 180 tablet 0   Multiple Vitamin (MULTIVITAMIN) tablet Take 1 tablet by mouth daily.     prochlorperazine (COMPAZINE) 10 MG tablet Take 1 tablet (10 mg total) by mouth every 6 (six) hours as needed for nausea or vomiting. 30 tablet 0   ramipril (ALTACE) 10 MG capsule TAKE 1 CAPSULE BY MOUTH TWICE  DAILY 180 capsule 0   timolol (BETIMOL) 0.5 % ophthalmic solution Place 1 drop into both eyes daily.     No current facility-administered medications for this visit.     Allergies: No Known Allergies    Physical Exam:  Blood pressure (!) 145/77, pulse 63, temperature 98.1 F (36.7 C), temperature source Oral, resp. rate 15, weight 137 lb 4.8 oz (62.3 kg), SpO2 100 %.    ECOG: 0   General appearance: Comfortable appearing without any discomfort Head: Normocephalic without any trauma Oropharynx: Dry mucous membrane with discoloration of his tongue. Eyes: Pupils are equal and round reactive to light. Lymph nodes: No cervical, supraclavicular, inguinal or axillary lymphadenopathy.   Heart:regular rate and rhythm.  S1 and S2 without leg edema. Lung:  Clear without any rhonchi or wheezes.  No dullness to percussion. Abdomin: Soft, nontender, nondistended with good bowel sounds.  No hepatosplenomegaly. Musculoskeletal: No joint deformity or effusion.  Full range of motion noted. Neurological: No deficits noted on motor, sensory and deep tendon reflex exam. Skin: No petechial rash or dryness.  Appeared moist.             Lab Results: Lab Results  Component Value Date   WBC 6.0 05/31/2022   HGB 13.7 05/31/2022   HCT 39.9 05/31/2022   MCV 87.5 05/31/2022   PLT 217 05/31/2022   PSA 1.54 09/05/2021      Chemistry      Component Value Date/Time   NA 141 05/31/2022 0800   K 4.0 05/31/2022 0800   CL 104 05/31/2022 0800   CO2 31 05/31/2022 0800   BUN 12 05/31/2022 0800   CREATININE 0.81 05/31/2022 0800   CREATININE 0.69 (L) 01/08/2021 1553      Component Value Date/Time   CALCIUM 10.0 05/31/2022 0800   ALKPHOS 102 05/31/2022 0800   AST 15 05/31/2022 0800   ALT 13 05/31/2022 0800   BILITOT 0.8 05/31/2022 0800       IMPRESSION: 1. Lingular nodule is similar to minimally enlarged. 2. Subtle left lower lobe peribronchovascular nodularity is likely due to minimal infection or aspiration. A dependent larger density within the left lower lobe is new, favored to represent a focus of atelectasis or infection. Recommend attention on follow-up. 3. No thoracic adenopathy. 4. No acute process or evidence of metastatic disease in the abdomen or pelvis. 5. The hepatic dome focus of hyperattenuation is less distinct today, favoring a perfusion anomaly. 6. Coronary artery atherosclerosis. Aortic Atherosclerosis   Impression and Plan:  82 year old man with:  1.  Skin cancer diagnosed in 2022.  He developed stage IV squamous cell with pulmonary involvement documented in 2023.     He has received Libtayo for total of 14 cycles of therapy with excellent response.  CT scan obtained on June 19, 2022 continues to show positive response to therapy.  Compared to his initial imaging in January 2023, he has very little residual disease with subcentimeter nodules remaining.  Risks and benefits of continuing Libtayo versus active surveillance were discussed.  Long-term complication associated with this therapy that includes autoimmune complications, GI toxicities among others were discussed.  After discussion today, he is agreeable to continue.  He will receive this treatment every 3 weeks and I recommend updating his staging scan in 3 months.  Therapy discontinuation could be considered if he develops  complications or continues to have very little to no residual disease.  2.  IV access: Peripheral veins continue to be in use without any issues.   3.  Antiemetics: Compazine is available to him without any nausea or vomiting.  4.  Autoimmune complications: I continue to reiterate the long-term complications associated with immunotherapy.  These include dermatitis, pneumonitis, hepatitis, hypophysitis among others.  5.  Dermatology surveillance: I recommended continued strict surveillance with dermatology given his high risk of developing recurrent disease.   6.  Follow-up: In 3 weeks for the next cycle of therapy.   30  minutes were dedicated to this encounter.  The time was spent on updating disease status, reviewing imaging studies treatment choices and outlining future plan of care review.  Zola Button, MD 1/5/20248:28 AM

## 2022-06-21 NOTE — Patient Instructions (Signed)
[facility]  Discharge Instructions: Thank you for choosing [facility] to provide your oncology and hematology care.   If you have a lab appointment with the Cancer Center, please go directly to the Cancer Center and check in at the registration area.   Wear comfortable clothing and clothing appropriate for easy access to any Portacath or PICC line.   We strive to give you quality time with your provider. You may need to reschedule your appointment if you arrive late (15 or more minutes).  Arriving late affects you and other patients whose appointments are after yours.  Also, if you miss three or more appointments without notifying the office, you may be dismissed from the clinic at the provider's discretion.      For prescription refill requests, have your pharmacy contact our office and allow 72 hours for refills to be completed.    Today you received the following chemotherapy and/or immunotherapy agents: Libtayo      To help prevent nausea and vomiting after your treatment, we encourage you to take your nausea medication as directed.  BELOW ARE SYMPTOMS THAT SHOULD BE REPORTED IMMEDIATELY: *FEVER GREATER THAN 100.4 F (38 C) OR HIGHER *CHILLS OR SWEATING *NAUSEA AND VOMITING THAT IS NOT CONTROLLED WITH YOUR NAUSEA MEDICATION *UNUSUAL SHORTNESS OF BREATH *UNUSUAL BRUISING OR BLEEDING *URINARY PROBLEMS (pain or burning when urinating, or frequent urination) *BOWEL PROBLEMS (unusual diarrhea, constipation, pain near the anus) TENDERNESS IN MOUTH AND THROAT WITH OR WITHOUT PRESENCE OF ULCERS (sore throat, sores in mouth, or a toothache) UNUSUAL RASH, SWELLING OR PAIN  UNUSUAL VAGINAL DISCHARGE OR ITCHING   Items with * indicate a potential emergency and should be followed up as soon as possible or go to the Emergency Department if any problems should occur.  Please show the CHEMOTHERAPY ALERT CARD or IMMUNOTHERAPY ALERT CARD at check-in to  the Emergency Department and triage nurse.  Should you have questions after your visit or need to cancel or reschedule your appointment, please contact [facility]  Dept: [phone]  and follow the prompts.  Office hours are 8:00 a.m. to 4:30 p.m. Monday - Friday. Please note that voicemails left after 4:00 p.m. may not be returned until the following business day.  We are closed weekends and major holidays. You have access to a nurse at all times for urgent questions. Please call the main number to the clinic Dept: [phone] and follow the prompts.   For any non-urgent questions, you may also contact your provider using MyChart. We now offer e-Visits for anyone 18 and older to request care online for non-urgent symptoms. For details visit [email or web address].   Also download the MyChart app! Go to the app store, search "MyChart", open the app, select [facility], and log in with your MyChart username and password.  Masks are optional in the cancer centers. If you would like for your care team to wear a mask while they are taking care of you, please let them know. You may have one support person who is at least 82 years old accompany you for your appointments. 

## 2022-06-22 LAB — T4: T4, Total: 6.6 ug/dL (ref 4.5–12.0)

## 2022-07-11 ENCOUNTER — Inpatient Hospital Stay: Payer: Medicare Other

## 2022-07-11 ENCOUNTER — Other Ambulatory Visit: Payer: Self-pay

## 2022-07-11 ENCOUNTER — Inpatient Hospital Stay: Payer: Medicare Other | Admitting: Internal Medicine

## 2022-07-11 ENCOUNTER — Encounter: Payer: Self-pay | Admitting: Internal Medicine

## 2022-07-11 VITALS — BP 154/68 | HR 67 | Temp 98.3°F | Resp 24

## 2022-07-11 VITALS — BP 120/75 | HR 81 | Temp 97.7°F | Resp 16 | Ht 70.0 in | Wt 134.8 lb

## 2022-07-11 DIAGNOSIS — C7989 Secondary malignant neoplasm of other specified sites: Secondary | ICD-10-CM

## 2022-07-11 DIAGNOSIS — C7800 Secondary malignant neoplasm of unspecified lung: Secondary | ICD-10-CM | POA: Diagnosis not present

## 2022-07-11 DIAGNOSIS — C44329 Squamous cell carcinoma of skin of other parts of face: Secondary | ICD-10-CM | POA: Diagnosis not present

## 2022-07-11 DIAGNOSIS — Z5112 Encounter for antineoplastic immunotherapy: Secondary | ICD-10-CM | POA: Diagnosis not present

## 2022-07-11 DIAGNOSIS — Z79899 Other long term (current) drug therapy: Secondary | ICD-10-CM | POA: Diagnosis not present

## 2022-07-11 LAB — CBC WITH DIFFERENTIAL (CANCER CENTER ONLY)
Abs Immature Granulocytes: 0.01 10*3/uL (ref 0.00–0.07)
Basophils Absolute: 0 10*3/uL (ref 0.0–0.1)
Basophils Relative: 1 %
Eosinophils Absolute: 0.3 10*3/uL (ref 0.0–0.5)
Eosinophils Relative: 4 %
HCT: 38.2 % — ABNORMAL LOW (ref 39.0–52.0)
Hemoglobin: 13.2 g/dL (ref 13.0–17.0)
Immature Granulocytes: 0 %
Lymphocytes Relative: 23 %
Lymphs Abs: 1.4 10*3/uL (ref 0.7–4.0)
MCH: 30.1 pg (ref 26.0–34.0)
MCHC: 34.6 g/dL (ref 30.0–36.0)
MCV: 87.2 fL (ref 80.0–100.0)
Monocytes Absolute: 0.4 10*3/uL (ref 0.1–1.0)
Monocytes Relative: 7 %
Neutro Abs: 4.2 10*3/uL (ref 1.7–7.7)
Neutrophils Relative %: 65 %
Platelet Count: 212 10*3/uL (ref 150–400)
RBC: 4.38 MIL/uL (ref 4.22–5.81)
RDW: 13.2 % (ref 11.5–15.5)
WBC Count: 6.3 10*3/uL (ref 4.0–10.5)
nRBC: 0 % (ref 0.0–0.2)

## 2022-07-11 LAB — CMP (CANCER CENTER ONLY)
ALT: 13 U/L (ref 0–44)
AST: 16 U/L (ref 15–41)
Albumin: 3.9 g/dL (ref 3.5–5.0)
Alkaline Phosphatase: 87 U/L (ref 38–126)
Anion gap: 6 (ref 5–15)
BUN: 16 mg/dL (ref 8–23)
CO2: 29 mmol/L (ref 22–32)
Calcium: 9.5 mg/dL (ref 8.9–10.3)
Chloride: 105 mmol/L (ref 98–111)
Creatinine: 0.9 mg/dL (ref 0.61–1.24)
GFR, Estimated: >60 mL/min (ref >60)
Glucose, Bld: 183 mg/dL — ABNORMAL HIGH (ref 70–99)
Potassium: 3.5 mmol/L (ref 3.5–5.1)
Sodium: 140 mmol/L (ref 135–145)
Total Bilirubin: 0.8 mg/dL (ref 0.3–1.2)
Total Protein: 6.8 g/dL (ref 6.5–8.1)

## 2022-07-11 LAB — TSH: TSH: 2.418 u[IU]/mL (ref 0.350–4.500)

## 2022-07-11 MED ORDER — SODIUM CHLORIDE 0.9 % IV SOLN
350.0000 mg | Freq: Once | INTRAVENOUS | Status: AC
  Administered 2022-07-11: 350 mg via INTRAVENOUS
  Filled 2022-07-11: qty 7

## 2022-07-11 MED ORDER — SODIUM CHLORIDE 0.9 % IV SOLN: Freq: Once | INTRAVENOUS | Status: AC

## 2022-07-11 NOTE — Patient Instructions (Signed)
[facility]  Discharge Instructions: Thank you for choosing [facility] to provide your oncology and hematology care.   If you have a lab appointment with the [facility], please go directly to the [facility] and check in at the registration area.   Wear comfortable clothing and clothing appropriate for easy access to any Portacath or PICC line.   We strive to give you quality time with your provider. You may need to reschedule your appointment if you arrive late (15 or more minutes).  Arriving late affects you and other patients whose appointments are after yours.  Also, if you miss three or more appointments without notifying the office, you may be dismissed from the clinic at the provider's discretion.      For prescription refill requests, have your pharmacy contact our office and allow 72 hours for refills to be completed.    Today you received the following chemotherapy and/or immunotherapy agents Libtayo      To help prevent nausea and vomiting after your treatment, we encourage you to take your nausea medication as directed.  BELOW ARE SYMPTOMS THAT SHOULD BE REPORTED IMMEDIATELY: *FEVER GREATER THAN 100.4 F (38 C) OR HIGHER *CHILLS OR SWEATING *NAUSEA AND VOMITING THAT IS NOT CONTROLLED WITH YOUR NAUSEA MEDICATION *UNUSUAL SHORTNESS OF BREATH *UNUSUAL BRUISING OR BLEEDING *URINARY PROBLEMS (pain or burning when urinating, or frequent urination) *BOWEL PROBLEMS (unusual diarrhea, constipation, pain near the anus) TENDERNESS IN MOUTH AND THROAT WITH OR WITHOUT PRESENCE OF ULCERS (sore throat, sores in mouth, or a toothache) UNUSUAL RASH, SWELLING OR PAIN  UNUSUAL VAGINAL DISCHARGE OR ITCHING   Items with * indicate a potential emergency and should be followed up as soon as possible or go to the Emergency Department if any problems should occur.  Please show the CHEMOTHERAPY ALERT CARD or IMMUNOTHERAPY ALERT CARD at check-in  to the Emergency Department and triage nurse.  Should you have questions after your visit or need to cancel or reschedule your appointment, please contact [facility]  Dept: [phone]  and follow the prompts.  Office hours are 8:00 a.m. to 4:30 p.m. Monday - Friday. Please note that voicemails left after 4:00 p.m. may not be returned until the following business day.  We are closed weekends and major holidays. You have access to a nurse at all times for urgent questions. Please call the main number to the clinic Dept: [phone] and follow the prompts.   For any non-urgent questions, you may also contact your provider using MyChart. We now offer e-Visits for anyone 21 and older to request care online for non-urgent symptoms. For details visit [email or web address].   Also download the MyChart app! Go to the app store, search "MyChart", open the app, select [facility], and log in with your MyChart username and password.  Cemiplimab Injection What is this medication? CEMIPLIMAB (se MIP li mab) treats skin cancer and lung cancer. It works by helping your immune system slow or stop the spread of cancer cells. It is a monoclonal antibody. This medicine may be used for other purposes; ask your health care provider or pharmacist if you have questions. COMMON BRAND NAME(S): LIBTAYO What should I tell my care team before I take this medication? They need to know if you have any of these conditions: Allogeneic stem cell transplant (uses someone else's stem cells) Autoimmune diseases, such as Crohn's disease, ulcerative colitis, or lupus Diabetes Nervous system problems,  such as myasthenia gravis or Guillain-Barre syndrome Organ transplant Recent or ongoing radiation Thyroid disease An unusual or allergic reaction to cemiplimab, other medications, foods, dyes, or preservatives Pregnant or trying to get pregnant Breast-feeding How should I use this  medication? This medication is infused into a vein. It is given by your care team in a hospital or clinic setting. A special MedGuide will be given to you before each treatment. Be sure to read this information carefully each time. Talk to your care team about the use of this medication in children. Special care may be needed. Overdosage: If you think you have taken too much of this medicine contact a poison control center or emergency room at once. NOTE: This medicine is only for you. Do not share this medicine with others. What if I miss a dose? Keep appointments for follow-up doses. It is important not to miss your dose. Call your care team if you are unable to keep an appointment. What may interact with this medication? Interactions have not been studied. This list may not describe all possible interactions. Give your health care provider a list of all the medicines, herbs, non-prescription drugs, or dietary supplements you use. Also tell them if you smoke, drink alcohol, or use illegal drugs. Some items may interact with your medicine. What should I watch for while using this medication? This medication may make you feel generally unwell. This is not uncommon as chemotherapy can affect healthy cells as well as cancer cells. Report any side effects. Continue your course of treatment even though you feel ill until your care team tells you to stop. You may need blood work done while you are taking this medication. This medication may cause serious skin reactions. They can happen in the weeks to months after starting the medication. Contact your care team right away if you notice fevers or flu-like symptoms with a rash. The rash may be red or purple and then turn into blisters or peeling of the skin. You may also notice a red rash with swelling of the face, lips, or lymph nodes in your neck or under your arms. Tell your care team right away if you have any changes in your vision. This medication may  increase blood sugar. The risk may be higher in patients who already have diabetes. Ask your care team what you can do to lower your risk of diabetes while taking this medication. Talk to your care team if you wish to become pregnant or think you might be pregnant. This medication can cause serious birth defects if taken during pregnancy. A negative pregnancy test is required before starting this medication. A reliable form of contraception is recommended while taking this medication and for at least 4 months after stopping it. Do not breast-feed while taking this medication and for at least 4 months after stopping it. What side effects may I notice from receiving this medication? Side effects that you should report to your care team as soon as possible: Allergic reactions--skin rash, itching, hives, swelling of the face, lips, tongue, or throat Dry cough, shortness of breath or trouble breathing Eye pain, redness, irritation, or discharge with blurry or decreased vision Heart muscle inflammation--unusual weakness or fatigue, shortness of breath, chest pain, fast or irregular heartbeat, dizziness, swelling of the ankles, feet, or hands Hormone gland problems--headache, sensitivity to light, unusual weakness or fatigue, dizziness, fast or irregular heartbeat, increased sensitivity to cold or heat, excessive sweating, constipation, hair loss, increased thirst or amount of urine,  tremors or shaking, irritability Infusion reactions--chest pain, shortness of breath or trouble breathing, feeling faint or lightheaded Kidney injury (glomerulonephritis)--decrease in the amount of urine, red or dark brown urine, foamy or bubbly urine, swelling of the ankles, hands, or feet Liver injury--right upper belly pain, loss of appetite, nausea, light-colored stool, dark yellow or brown urine, yellowing skin or eyes, unusual weakness or fatigue Pain, tingling, or numbness in the hands or feet, muscle weakness, change in  vision, confusion or trouble speaking, loss of balance or coordination, trouble walking, seizures Rash, fever, and swollen lymph nodes Redness, blistering, peeling, or loosening of the skin, including inside the mouth Sudden or severe stomach pain, bloody diarrhea, fever, nausea, vomiting Side effects that usually do not require medical attention (report these to your care team if they continue or are bothersome): Bone, joint, or muscle pain Diarrhea Fatigue Loss of appetite Nausea Skin rash This list may not describe all possible side effects. Call your doctor for medical advice about side effects. You may report side effects to FDA at 1-800-FDA-1088. Where should I keep my medication? This medication is given in a hospital or clinic and will not be stored at home. NOTE: This sheet is a summary. It may not cover all possible information. If you have questions about this medicine, talk to your doctor, pharmacist, or health care provider.  2023 [employer]/Gold Standard (2021-05-09 00:00:00)

## 2022-07-11 NOTE — Progress Notes (Signed)
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 He initially presented with skin cancer around the parotid gland in April 2022.   PRIOR THERAPY: 1) status post left parotidectomy with skin resection and rotational flap completed by Dr. Constance Holster in April 2022. Final pathology showed squamous cell carcinoma.  2) status post radiation therapy under the care of Dr. Isidore Moos completing 33 fractions on December 19, 2020 for a total of [place] THERAPY: Libtayo (Cempilimab) 350 Mg IV every 3 weeks started August 10, 2021.  Status post 15 cycles of treatment.  INTERVAL HISTORY: Shaun Mcmillan 82 y.o. male came to the clinic today to establish care with me after his primary oncologist Dr. Alen Blew left the practice.  The patient was accompanied by his wife.  He is feeling fine today with no concerning complaints except for occasional itching with the treatment.  He was diagnosed with a stage IV squamous cell carcinoma of the skin in 2022 with the skin around the parotid gland with evidence of pulmonary metastasis in 2023.  The patient underwent left parotidectomy followed by palliative radiotherapy under the care of Dr. Isidore Moos.  When he had evidence for disease metastasis to the lung he started treatment with Libtayo (Cempilimab) 350 Mg IV every 3 weeks on August 10, 2021 status post 15 cycles.  He has been tolerating this treatment well with no concerning adverse effects.  He denied having any current chest pain, shortness of breath, cough or hemoptysis.  He has no nausea, vomiting, diarrhea or constipation.  He has no headache or visual changes.  He denied having any recent weight loss or night sweats.  He is here today for evaluation before starting cycle #16. MEDICAL HISTORY: Past  Medical History:  Diagnosis Date   Arthritis    Diabetes mellitus type II    Glaucoma    sees Dr. 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  MEDICATIONS:  Current Outpatient Medications  Medication Sig Dispense Refill   acetaminophen (TYLENOL) 500 MG tablet Take 1,000 mg by mouth every 8 (eight) hours as needed for moderate pain.     amLODipine (NORVASC) 10 MG tablet TAKE 1 TABLET BY MOUTH DAILY 90 tablet 3   aspirin EC 81 MG tablet Take 1 tablet (81 mg total) by mouth daily. Swallow whole. 30 tablet 11   atorvastatin (LIPITOR) 40 MG tablet TAKE 1 TABLET BY MOUTH DAILY 90 tablet 0   Cholecalciferol (VITAMIN D PO) Take 5,000 Units by mouth daily.     gabapentin (NEURONTIN) 300 MG capsule TAKE 2 CAPSULES BY MOUTH 3 TIMES A DAY 180 capsule 1   latanoprost (XALATAN) 0.005 % ophthalmic solution Place 1 drop into both eyes every morning.     LORazepam (ATIVAN) 1 MG tablet Take 1 tablet (1 mg total) by mouth 3 (three) times daily as needed for anxiety. 60 tablet 5   Melatonin 5 MG TABS Take 5 mg by mouth at bedtime.  metFORMIN (GLUCOPHAGE) 500 MG tablet TAKE 1 TABLET BY MOUTH TWICE  DAILY WITH A MEAL 180 tablet 0   Multiple Vitamin (MULTIVITAMIN) tablet Take 1 tablet by mouth daily.     prochlorperazine (COMPAZINE) 10 MG tablet Take 1 tablet (10 mg total) by mouth every 6 (six) hours as needed for nausea or vomiting. 30 tablet 0   ramipril (ALTACE) 10 MG capsule TAKE 1 CAPSULE BY MOUTH TWICE  DAILY 180 capsule 0   timolol (BETIMOL) 0.5 % ophthalmic solution Place 1 drop into both eyes daily.     No current facility-administered medications for this visit.    SURGICAL HISTORY:  Past Surgical History:  Procedure Laterality Date   APPENDECTOMY     colonscopy   03/12/2016   per Dr. Watt Climes, benign polyps, repeat in 5 yrs   left craniotomy  11/03/08   per Dr. Sherley Bounds for a subdrual hematoma   PAROTIDECTOMY Left 09/20/2020   Procedure: PAROTIDECTOMY with resection of skin and rotational flap;  Surgeon: Izora Gala, MD;  Location: [facility];  Service: ENT;  Laterality: Left;   surgery for ocmpound fracture     right leg    REVIEW OF SYSTEMS:  Constitutional: negative Eyes: negative Ears, nose, mouth, throat, and face: negative Respiratory: negative Cardiovascular: negative Gastrointestinal: negative Genitourinary:negative Integument/breast: negative Hematologic/lymphatic: negative Musculoskeletal:negative Neurological: negative Behavioral/Psych: negative Endocrine: negative Allergic/Immunologic: negative   PHYSICAL EXAMINATION: General appearance: alert, cooperative, fatigued, and no distress Head: Normocephalic, without obvious abnormality, atraumatic Neck: no adenopathy, no JVD, supple, symmetrical, trachea midline, and thyroid not enlarged, symmetric, no tenderness/mass/nodules Lymph nodes: Cervical, supraclavicular, and axillary nodes normal. Resp: clear to auscultation bilaterally Back: symmetric, no curvature. ROM normal. No CVA tenderness. Cardio: regular rate and rhythm, S1, S2 normal, no murmur, click, rub or gallop GI: soft, non-tender; bowel sounds normal; no masses,  no organomegaly Extremities: extremities normal, atraumatic, no cyanosis or edema Neurologic: Alert and oriented X 3, normal strength and tone. Normal symmetric reflexes. Normal coordination and gait  ECOG PERFORMANCE STATUS: 1 - Symptomatic but completely ambulatory  Blood pressure 120/75, pulse 81, temperature 97.7 F (36.5 C), temperature source Temporal, resp. rate 16, height '5\' 10"'$  (1.778 m), weight 134 lb 12.8 oz (61.1 kg), SpO2 98 %.  LABORATORY DATA: Lab Results  Component Value Date   WBC 6.3 07/11/2022   HGB 13.2 07/11/2022   HCT 38.2 (L) 07/11/2022    MCV 87.2 07/11/2022   PLT 212 07/11/2022      Chemistry      Component Value Date/Time   NA 141 06/21/2022 0819   K 3.5 06/21/2022 0819   CL 104 06/21/2022 0819   CO2 30 06/21/2022 0819   BUN 12 06/21/2022 0819   CREATININE 0.89 06/21/2022 0819   CREATININE 0.69 (L) 01/08/2021 1553      Component Value Date/Time   CALCIUM 9.7 06/21/2022 0819   ALKPHOS 97 06/21/2022 0819   AST 23 06/21/2022 0819   ALT 20 06/21/2022 0819   BILITOT 0.7 06/21/2022 0819       RADIOGRAPHIC STUDIES: CT Soft Tissue Neck W Contrast  Result Date: 06/21/2022 CLINICAL DATA:  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 EXAM: CT NECK WITH CONTRAST TECHNIQUE: Multidetector CT imaging of the neck was performed using the standard protocol following the bolus administration of intravenous contrast. RADIATION DOSE REDUCTION: This exam was performed according to the departmental dose-optimization program which includes automated exposure control, adjustment of the mA and/or kV according to patient size and/or use  of iterative reconstruction technique. CONTRAST:  17m OMNIPAQUE IOHEXOL 300 MG/ML  SOLN COMPARISON:  CT neck 04/17/2022 FINDINGS: Pharynx and larynx: The nasal cavity and nasopharynx are unremarkable. The oral cavity and oropharynx are unremarkable. The parapharyngeal spaces are clear. The hypopharynx and larynx are unremarkable. The vocal folds are normal in appearance. There is no retropharyngeal fluid collection.  The airway is patent. Salivary glands: The patient is status post partial left parotidectomy. There is no new suspicious finding in the operative bed. The right parotid gland and bilateral submandibular glands are unremarkable. Thyroid: Unremarkable. Lymph nodes: There is no pathologic lymphadenopathy in the neck. Vascular: There is calcified plaque in the aortic arch and bilateral carotid bifurcations. Limited intracranial: The imaged portions of the intracranial compartment are  unremarkable. Visualized orbits: The imaged globes and orbits are unremarkable. Mastoids and visualized paranasal sinuses: Clear. Skeleton: There is no acute osseous abnormality or suspicious osseous lesion. Multilevel degenerative changes are again seen in the cervical spine. Upper chest: Assessed on the separately dictated CT chest. IMPRESSION: Stable exam with no evidence of residual or recurrent disease. Electronically Signed   By: PValetta MoleM.D.   On: 06/21/2022 13:30   CT CHEST ABDOMEN PELVIS W CONTRAST  Result Date: 06/20/2022 CLINICAL DATA:  Squamous cell carcinoma with metastasis to parotid gland. * Tracking Code: BO * EXAM: CT CHEST, ABDOMEN, AND PELVIS WITH CONTRAST TECHNIQUE: Multidetector CT imaging of the chest, abdomen and pelvis was performed following the standard protocol during bolus administration of intravenous contrast. RADIATION DOSE REDUCTION: This exam was performed according to the departmental dose-optimization program which includes automated exposure control, adjustment of the mA and/or kV according to patient size and/or use of iterative reconstruction technique. CONTRAST:  1051mOMNIPAQUE IOHEXOL 300 MG/ML  SOLN COMPARISON:  04/17/2022 FINDINGS: CT CHEST FINDINGS Cardiovascular: Aortic atherosclerosis. Tortuous thoracic aorta. Normal heart size, without pericardial effusion. Three vessel coronary artery calcification. No central pulmonary embolism, on this non-dedicated study. Mediastinum/Nodes: No mediastinal or hilar adenopathy. Left infrahilar node is similar to slightly decreased at 6 mm today versus 8 mm on the prior. Not pathologic by size criteria. Lungs/Pleura: No pleural fluid. Mild left hemidiaphragm elevation/eventration posteriorly. Bibasilar scarring. Anterior left upper lobe calcified granuloma of 2-3 mm. A peribronchovascular lingular nodule measures 6 x 5 mm on 72/4 versus 6 x 5 mm on the prior. 8 mm on coronal image 58 today versus similar on the prior (when  remeasured). Subtle peribronchovascular ill-defined nodularity involving the left lower lobe including on 104/4 is new. Dependent left lower lobe pleural-based density of 1.0 cm on 01/20/4 is new, favored to represent atelectasis. Musculoskeletal: No acute osseous abnormality. CT ABDOMEN PELVIS FINDINGS Hepatobiliary: The 8 mm focus of right hepatic dome hyperenhancement is less distinct today, favoring a perfusion anomaly on 55/2. No suspicious liver lesion or biliary abnormality. Pancreas: Normal, without mass or ductal dilatation. Spleen: Normal in size, without focal abnormality. Adrenals/Urinary Tract: Normal adrenal glands. Normal left kidney. No hydronephrosis. An upper pole right renal 5.3 cm cystic lesion may have a thin septation within, consistent with a cyst or minimally complex cyst . In the absence of clinically indicated signs/symptoms require(s) no independent follow-up. Normal urinary bladder. Stomach/Bowel: Normal stomach, without wall thickening. Colonic stool burden suggests constipation. Normal terminal ileum. Normal small bowel. Vascular/Lymphatic: Advanced aortic and branch vessel atherosclerosis. No abdominopelvic adenopathy. Reproductive: Normal prostate. Other: No significant free fluid. Musculoskeletal: Presumed bone island within the posterior left iliac. Lumbar spondylosis. IMPRESSION: 1. Lingular nodule is similar to  minimally enlarged. 2. Subtle left lower lobe peribronchovascular nodularity is likely due to minimal infection or aspiration. A dependent larger density within the left lower lobe is new, favored to represent a focus of atelectasis or infection. Recommend attention on follow-up. 3. No thoracic adenopathy. 4. No acute process or evidence of metastatic disease in the abdomen or pelvis. 5. The hepatic dome focus of hyperattenuation is less distinct today, favoring a perfusion anomaly. 6. Coronary artery atherosclerosis. Aortic Atherosclerosis (ICD10-I70.0). Electronically  Signed   By: Abigail Miyamoto M.D.   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  He had evidence for disease progression and metastasis to the lung in February 2023. The patient is currently undergoing treatment with immunotherapy with Libtayo (Cempilimab) 350 Mg IV every 3 weeks status post 15 cycles.  The patient has been tolerating this treatment fairly well with no concerning adverse effects except for occasional itching. He had repeat CT scan of the chest, abdomen pelvis as well as the neck performed earlier this month and that showed no concerning findings for disease progression. I had a lengthy discussion with the patient today about the role of the immunotherapy and duration of treatment which usually around 2 years for most of the immunotherapy regiment and different diseases. I recommended for the patient to continue his current treatment with Libtayo (Cempilimab) and he will proceed with cycle #16 today.  The patient understands if he has any concerning adverse effects or disease progression, we will discontinue this treatment and consider other treatment options. He will come back for follow-up visit in 3 weeks for evaluation before the next cycle of his treatment. Patient was advised to call immediately if he has any other concerning symptoms in the interval. The patient voices understanding of current disease status and treatment options and is in agreement with the current care plan.  All questions were answered. The patient knows to call the clinic with any problems, questions or concerns. We can certainly see the patient much sooner if necessary.  The total time spent in the appointment was  55 minutes.  Disclaimer: This note was dictated with voice recognition software. Similar sounding words can inadvertently be transcribed and may not be corrected upon review.

## 2022-07-13 ENCOUNTER — Encounter: Payer: Self-pay | Admitting: Oncology

## 2022-07-13 LAB — T4: T4, Total: 7.2 ug/dL (ref 4.5–12.0)

## 2022-07-22 ENCOUNTER — Other Ambulatory Visit: Payer: Self-pay | Admitting: Oncology

## 2022-07-30 NOTE — Progress Notes (Unsigned)
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 He initially presented with skin cancer around the parotid gland in April 2022.    PRIOR THERAPY: 1) status post left parotidectomy with skin resection and rotational flap completed by Dr. Constance Holster in April 2022. Final pathology showed squamous cell carcinoma.  2) status post radiation therapy under the care of Dr. Isidore Moos completing 33 fractions on December 19, 2020 for a total of [place] THERAPY: Libtayo (Cempilimab) 350 Mg IV every 3 weeks started August 10, 2021. Status post 16 cycles of treatment.   INTERVAL HISTORY: Shaun Mcmillan 82 y.o. male returns to the clinic today for a follow-up visit accompanied by his wife.  The patient was last seen by Dr. Julien Nordmann 3 weeks ago.  The patient is currently undergoing immunotherapy with Libtayo every 3 weeks.  He tolerates treatment well. He is currently undergoing treatment due to his stage IV squamous cell carcinoma of the skin which was diagnosed in 2022 with parotid gland involvement and evidence of metastatic pulmonary metastases in 2023.  The patient underwent left parotidectomy followed by palliative radiotherapy under the care of Dr. Isidore Moos. He still has some numbness/paralysis along his cheek. His wife mentions he sometimes coughs when he eats. Of note, his last scan mentions that he may have mild inflammation which may be aspiration. Of note, when I saw this patient in July 2023, we noticed he had a black growth on his tongue. He has seen his dentist every 3 months. He states his dentist gave him a tongue scraper. Unclear if this is localized on the tongue or includes the esophagus too. He denies any fever, chills, night sweats, unexplained weight loss. He gained 2 lbs. Denies any chest pain, shortness of breath, or hemoptysis.   Denies any nausea, vomiting, diarrhea, or constipation.  Denies any headache or visual changes.  Denies any rashes or skin changes.  He has a conflict for his upcoming appointment on 3/28 and is scheduled to also see his dermatologist that day.  He is here today for evaluation repeat blood work before undergoing cycle #17.   MEDICAL HISTORY: Past Medical History:  Diagnosis Date   Arthritis    Diabetes mellitus type II    Glaucoma    sees Dr. 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  MEDICATIONS:  Current Outpatient Medications  Medication Sig Dispense Refill   nystatin (MYCOSTATIN) 100000 UNIT/ML suspension Take 5 mLs (500,000 Units total) by mouth 4 (four) times daily. 60 mL 0   acetaminophen (TYLENOL) 500 MG tablet Take 1,000 mg by mouth every 8 (eight) hours as needed for moderate pain.     amLODipine (NORVASC) 10 MG tablet TAKE 1 TABLET BY MOUTH DAILY 90 tablet 3   aspirin EC 81 MG tablet Take 1 tablet (81 mg total) by mouth daily. Swallow whole. 30 tablet 11   atorvastatin (LIPITOR) 40 MG tablet TAKE 1 TABLET BY MOUTH DAILY 90 tablet 0   Cholecalciferol (VITAMIN D PO) Take 5,000 Units by mouth daily.  gabapentin (NEURONTIN) 300 MG capsule TAKE 2 CAPSULES BY MOUTH 3 TIMES A DAY 180 capsule 1   latanoprost (XALATAN) 0.005 % ophthalmic solution Place 1 drop into both eyes every morning.     LORazepam (ATIVAN) 1 MG tablet Take 1 tablet (1 mg total) by mouth 3 (three) times daily as needed for anxiety. 60 tablet 5   Melatonin 5 MG TABS Take 5 mg by mouth at bedtime.     metFORMIN (GLUCOPHAGE) 500 MG tablet TAKE 1 TABLET BY MOUTH TWICE  DAILY WITH A MEAL 180 tablet 0   Multiple Vitamin (MULTIVITAMIN) tablet Take 1 tablet by mouth daily.     prochlorperazine  (COMPAZINE) 10 MG tablet Take 1 tablet (10 mg total) by mouth every 6 (six) hours as needed for nausea or vomiting. 30 tablet 0   ramipril (ALTACE) 10 MG capsule TAKE 1 CAPSULE BY MOUTH TWICE  DAILY 180 capsule 0   timolol (BETIMOL) 0.5 % ophthalmic solution Place 1 drop into both eyes daily.     No current facility-administered medications for this visit.    SURGICAL HISTORY:  Past Surgical History:  Procedure Laterality Date   APPENDECTOMY     colonscopy  03/12/2016   per Dr. Watt Climes, benign polyps, repeat in 5 yrs   left craniotomy  11/03/08   per Dr. Sherley Bounds for a subdrual hematoma   PAROTIDECTOMY Left 09/20/2020   Procedure: PAROTIDECTOMY with resection of skin and rotational flap;  Surgeon: Izora Gala, MD;  Location: [facility];  Service: ENT;  Laterality: Left;   surgery for ocmpound fracture     right leg    REVIEW OF SYSTEMS:   Review of Systems  Constitutional: Negative for appetite change, chills, fatigue, fever and unexpected weight change.  HENT: Positive for some paralysis on left cheek/face. Positive for black growth on tongue. Positive for sensation of dysphagia with eating. Negative for mouth sores, nosebleeds, sore throat.  Eyes: Negative for eye problems and icterus.  Respiratory: Positive for cough with eating. Negative for hemoptysis, shortness of breath and wheezing.   Cardiovascular: Negative for chest pain and leg swelling.  Gastrointestinal: Negative for abdominal pain, constipation, diarrhea, nausea and vomiting.  Genitourinary: Negative for bladder incontinence, difficulty urinating, dysuria, frequency and hematuria.   Musculoskeletal: Negative for back pain, gait problem, neck pain and neck stiffness.  Skin: Negative for itching and rash.  Neurological: Negative for dizziness, extremity weakness, gait problem, headaches, light-headedness and seizures.  Hematological: Negative for adenopathy. Does not bruise/bleed easily.  Psychiatric/Behavioral: Negative for  confusion, depression and sleep disturbance. The patient is not nervous/anxious.     PHYSICAL EXAMINATION:  Blood pressure (!) 142/63, pulse 67, temperature 98 F (36.7 C), temperature source Oral, resp. rate 16, weight 136 lb (61.7 kg), SpO2 100 %.  ECOG PERFORMANCE STATUS: 1  Physical Exam  Constitutional: Oriented to person, place, and time and well-developed, well-nourished, and in no distress. No distress.  HENT:  Head: Normocephalic and atraumatic.  Mouth/Throat: Oropharynx is clear and moist. Positive for hyperpigmented growth on tongue.   Eyes: Conjunctivae are normal. Right eye exhibits no discharge. Left eye exhibits no discharge. No scleral icterus.  Neck: Normal range of motion. Neck supple.  Cardiovascular: Normal rate, regular rhythm, normal heart sounds and intact distal pulses.   Pulmonary/Chest: Effort normal and breath sounds normal. No respiratory distress. No wheezes. No rales.  Abdominal: Soft. Bowel sounds are normal. Exhibits no distension and no mass. There is no tenderness.  Musculoskeletal: Normal range of motion.  Exhibits no edema.  Lymphadenopathy:    No cervical adenopathy.  Neurological: Alert and oriented to person, place, and time. Exhibits normal muscle tone. Gait normal. Coordination normal.  Skin: Skin is warm and dry. No rash noted. Not diaphoretic. No erythema. No pallor.  Psychiatric: Mood, memory and judgment normal.  Vitals reviewed.  LABORATORY DATA: Lab Results  Component Value Date   WBC 5.3 08/01/2022   HGB 13.6 08/01/2022   HCT 39.8 08/01/2022   MCV 88.1 08/01/2022   PLT 203 08/01/2022      Chemistry      Component Value Date/Time   NA 140 07/11/2022 0818   K 3.5 07/11/2022 0818   CL 105 07/11/2022 0818   CO2 29 07/11/2022 0818   BUN 16 07/11/2022 0818   CREATININE 0.90 07/11/2022 0818   CREATININE 0.69 (L) 01/08/2021 1553      Component Value Date/Time   CALCIUM 9.5 07/11/2022 0818   ALKPHOS 87 07/11/2022 0818   AST 16  07/11/2022 0818   ALT 13 07/11/2022 0818   BILITOT 0.8 07/11/2022 0818       RADIOGRAPHIC STUDIES:  No results found.   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  He was initially diagnosed in April 2022 with involvement of the skin around the left parotid gland status post left parotidectomy with skin resection followed by adjuvant radiation.  He then had evidence of disease progression with metastases to the lung in February 2023.  The patient is currently undergoing systemic treatment with immunotherapy with Libtayo 350 mg IV every 3 weeks.  He is status post 16 cycles.  The plan is to complete 2 years of therapy as long as he is tolerating well and there is no evidence of disease progression or unacceptable toxicity.  Labs were reviewed.  Recommend that she proceed with cycle number 17 today scheduled.  We will see him back for follow-up visit in 3 weeks for evaluation repeat blood work before undergoing cycle #18.  It sounds like the patient may have some aspiration when he eats as he sometimes coughs. Unclear if related to his partial paralysis from prior radiation and surgery of the left cheek vs other etiology such as the growth on his tongue extending into his esophagus. The patient states his symptoms are mild. However, I discussed the next step would be seeing GI to unsure no esophageal concern such as infectious esophagitis, stricture, motility issue, etc. He previously saw Dr. Watt Climes from GI. For now, I will send in a prescription for nystatin in the event this is yeast. I may also add some vitamin studies to his next lab visit. The patient denies tobacco use.   I did discuss with the patient to eat smaller bites, sit upright while eating, and to drink liquids in between bites to reduce the risk of aspiration.   Him and his wife mention that for sometime now he intermittently  develops mild lip swelling. They have mentioned this to other providers in the past reportedly and this has been ongoing. Will ask that they ask their PCP if this could be related to ramipril and if they would recommend switching to a different antihypertensive. Discussed should he ever have throat tightness or shortness of breath, he would need to call EMS. They have not been able to associate this with any triggering factors.   The patient was advised to call immediately if he has  any concerning symptoms in the interval. The patient voices understanding of current disease status and treatment options and is in agreement with the current care plan. All questions were answered. The patient knows to call the clinic with any problems, questions or concerns. We can certainly see the patient much sooner if necessary   No orders of the defined types were placed in this encounter.    The total time spent in the appointment was 30-39 minutes.   Lorre Opdahl L Shabre Kreher, PA-C 08/01/22

## 2022-08-01 ENCOUNTER — Inpatient Hospital Stay: Payer: Medicare Other | Attending: Oncology

## 2022-08-01 ENCOUNTER — Inpatient Hospital Stay: Payer: Medicare Other

## 2022-08-01 ENCOUNTER — Inpatient Hospital Stay (HOSPITAL_BASED_OUTPATIENT_CLINIC_OR_DEPARTMENT_OTHER): Payer: Medicare Other | Admitting: Physician Assistant

## 2022-08-01 VITALS — BP 142/63 | HR 67 | Temp 98.0°F | Resp 16 | Wt 136.0 lb

## 2022-08-01 DIAGNOSIS — C44329 Squamous cell carcinoma of skin of other parts of face: Secondary | ICD-10-CM | POA: Diagnosis not present

## 2022-08-01 DIAGNOSIS — B370 Candidal stomatitis: Secondary | ICD-10-CM

## 2022-08-01 DIAGNOSIS — Z79620 Long term (current) use of immunosuppressive biologic: Secondary | ICD-10-CM | POA: Insufficient documentation

## 2022-08-01 DIAGNOSIS — C7800 Secondary malignant neoplasm of unspecified lung: Secondary | ICD-10-CM | POA: Diagnosis not present

## 2022-08-01 DIAGNOSIS — C7989 Secondary malignant neoplasm of other specified sites: Secondary | ICD-10-CM

## 2022-08-01 DIAGNOSIS — Z5112 Encounter for antineoplastic immunotherapy: Secondary | ICD-10-CM | POA: Diagnosis not present

## 2022-08-01 LAB — CBC WITH DIFFERENTIAL (CANCER CENTER ONLY)
Abs Immature Granulocytes: 0.01 10*3/uL (ref 0.00–0.07)
Basophils Absolute: 0 10*3/uL (ref 0.0–0.1)
Basophils Relative: 0 %
Eosinophils Absolute: 0.3 10*3/uL (ref 0.0–0.5)
Eosinophils Relative: 6 %
HCT: 39.8 % (ref 39.0–52.0)
Hemoglobin: 13.6 g/dL (ref 13.0–17.0)
Immature Granulocytes: 0 %
Lymphocytes Relative: 28 %
Lymphs Abs: 1.5 10*3/uL (ref 0.7–4.0)
MCH: 30.1 pg (ref 26.0–34.0)
MCHC: 34.2 g/dL (ref 30.0–36.0)
MCV: 88.1 fL (ref 80.0–100.0)
Monocytes Absolute: 0.4 10*3/uL (ref 0.1–1.0)
Monocytes Relative: 7 %
Neutro Abs: 3.1 10*3/uL (ref 1.7–7.7)
Neutrophils Relative %: 59 %
Platelet Count: 203 10*3/uL (ref 150–400)
RBC: 4.52 MIL/uL (ref 4.22–5.81)
RDW: 13.2 % (ref 11.5–15.5)
WBC Count: 5.3 10*3/uL (ref 4.0–10.5)
nRBC: 0 % (ref 0.0–0.2)

## 2022-08-01 LAB — CMP (CANCER CENTER ONLY)
ALT: 19 U/L (ref 0–44)
AST: 23 U/L (ref 15–41)
Albumin: 4.2 g/dL (ref 3.5–5.0)
Alkaline Phosphatase: 86 U/L (ref 38–126)
Anion gap: 11 (ref 5–15)
BUN: 18 mg/dL (ref 8–23)
CO2: 27 mmol/L (ref 22–32)
Calcium: 9.2 mg/dL (ref 8.9–10.3)
Chloride: 100 mmol/L (ref 98–111)
Creatinine: 0.77 mg/dL (ref 0.61–1.24)
GFR, Estimated: >60 mL/min (ref >60)
Glucose, Bld: 238 mg/dL — ABNORMAL HIGH (ref 70–99)
Potassium: 3.5 mmol/L (ref 3.5–5.1)
Sodium: 138 mmol/L (ref 135–145)
Total Bilirubin: 0.9 mg/dL (ref 0.3–1.2)
Total Protein: 6.9 g/dL (ref 6.5–8.1)

## 2022-08-01 LAB — TSH: TSH: 2.61 u[IU]/mL (ref 0.350–4.500)

## 2022-08-01 MED ORDER — SODIUM CHLORIDE 0.9 % IV SOLN: Freq: Once | INTRAVENOUS | Status: AC

## 2022-08-01 MED ORDER — SODIUM CHLORIDE 0.9 % IV SOLN
350.0000 mg | Freq: Once | INTRAVENOUS | Status: AC
  Administered 2022-08-01: 350 mg via INTRAVENOUS
  Filled 2022-08-01: qty 7

## 2022-08-01 MED ORDER — NYSTATIN 100000 UNIT/ML MT SUSP: 5.0000 mL | Freq: Four times a day (QID) | OROMUCOSAL | 0 refills | Status: DC

## 2022-08-01 NOTE — Patient Instructions (Signed)
[facility]  Discharge Instructions: Thank you for choosing [facility] to provide your oncology and hematology care.   If you have a lab appointment with the [facility], please go directly to the [facility] and check in at the registration area.   Wear comfortable clothing and clothing appropriate for easy access to any Portacath or PICC line.   We strive to give you quality time with your provider. You may need to reschedule your appointment if you arrive late (15 or more minutes).  Arriving late affects you and other patients whose appointments are after yours.  Also, if you miss three or more appointments without notifying the office, you may be dismissed from the clinic at the provider's discretion.      For prescription refill requests, have your pharmacy contact our office and allow 72 hours for refills to be completed.    Today you received the following chemotherapy and/or immunotherapy agents; Cemiplimab (Libtayo)      To help prevent nausea and vomiting after your treatment, we encourage you to take your nausea medication as directed.  BELOW ARE SYMPTOMS THAT SHOULD BE REPORTED IMMEDIATELY: *FEVER GREATER THAN 100.4 F (38 C) OR HIGHER *CHILLS OR SWEATING *NAUSEA AND VOMITING THAT IS NOT CONTROLLED WITH YOUR NAUSEA MEDICATION *UNUSUAL SHORTNESS OF BREATH *UNUSUAL BRUISING OR BLEEDING *URINARY PROBLEMS (pain or burning when urinating, or frequent urination) *BOWEL PROBLEMS (unusual diarrhea, constipation, pain near the anus) TENDERNESS IN MOUTH AND THROAT WITH OR WITHOUT PRESENCE OF ULCERS (sore throat, sores in mouth, or a toothache) UNUSUAL RASH, SWELLING OR PAIN  UNUSUAL VAGINAL DISCHARGE OR ITCHING   Items with * indicate a potential emergency and should be followed up as soon as possible or go to the Emergency Department if any problems should occur.  Please show the CHEMOTHERAPY ALERT CARD or IMMUNOTHERAPY ALERT  CARD at check-in to the Emergency Department and triage nurse.  Should you have questions after your visit or need to cancel or reschedule your appointment, please contact [facility]  Dept: [phone]  and follow the prompts.  Office hours are 8:00 a.m. to 4:30 p.m. Monday - Friday. Please note that voicemails left after 4:00 p.m. may not be returned until the following business day.  We are closed weekends and major holidays. You have access to a nurse at all times for urgent questions. Please call the main number to the clinic Dept: [phone] and follow the prompts.   For any non-urgent questions, you may also contact your provider using MyChart. We now offer e-Visits for anyone 59 and older to request care online for non-urgent symptoms. For details visit [email or web address].   Also download the MyChart app! Go to the app store, search "MyChart", open the app, select [facility], and log in with your MyChart username and password.

## 2022-08-01 NOTE — Progress Notes (Signed)
Patient seen by PA today  Vitals are within treatment parameters.  Labs reviewed: and are within treatment parameters.  Per physician team, patient is ready for treatment and there are NO modifications to the treatment plan.

## 2022-08-03 LAB — T4: T4, Total: 6.5 ug/dL (ref 4.5–12.0)

## 2022-08-05 ENCOUNTER — Other Ambulatory Visit: Payer: Self-pay | Admitting: Family Medicine

## 2022-08-06 DIAGNOSIS — H401132 Primary open-angle glaucoma, bilateral, moderate stage: Secondary | ICD-10-CM | POA: Diagnosis not present

## 2022-08-06 DIAGNOSIS — H26492 Other secondary cataract, left eye: Secondary | ICD-10-CM | POA: Diagnosis not present

## 2022-08-19 ENCOUNTER — Telehealth: Payer: Self-pay | Admitting: Internal Medicine

## 2022-08-19 NOTE — Telephone Encounter (Signed)
Called patient regarding March appointments, patient is notified.

## 2022-08-22 ENCOUNTER — Inpatient Hospital Stay: Payer: Medicare Other | Attending: Oncology

## 2022-08-22 ENCOUNTER — Inpatient Hospital Stay (HOSPITAL_BASED_OUTPATIENT_CLINIC_OR_DEPARTMENT_OTHER): Payer: Medicare Other | Admitting: Internal Medicine

## 2022-08-22 ENCOUNTER — Inpatient Hospital Stay: Payer: Medicare Other

## 2022-08-22 DIAGNOSIS — C44329 Squamous cell carcinoma of skin of other parts of face: Secondary | ICD-10-CM | POA: Insufficient documentation

## 2022-08-22 DIAGNOSIS — C7989 Secondary malignant neoplasm of other specified sites: Secondary | ICD-10-CM

## 2022-08-22 DIAGNOSIS — C7800 Secondary malignant neoplasm of unspecified lung: Secondary | ICD-10-CM | POA: Diagnosis not present

## 2022-08-22 DIAGNOSIS — Z79620 Long term (current) use of immunosuppressive biologic: Secondary | ICD-10-CM | POA: Insufficient documentation

## 2022-08-22 DIAGNOSIS — Z5112 Encounter for antineoplastic immunotherapy: Secondary | ICD-10-CM | POA: Insufficient documentation

## 2022-08-22 LAB — CMP (CANCER CENTER ONLY)
ALT: 14 U/L (ref 0–44)
AST: 18 U/L (ref 15–41)
Albumin: 4.1 g/dL (ref 3.5–5.0)
Alkaline Phosphatase: 87 U/L (ref 38–126)
Anion gap: 6 (ref 5–15)
BUN: 14 mg/dL (ref 8–23)
CO2: 31 mmol/L (ref 22–32)
Calcium: 9.4 mg/dL (ref 8.9–10.3)
Chloride: 103 mmol/L (ref 98–111)
Creatinine: 0.92 mg/dL (ref 0.61–1.24)
GFR, Estimated: >60 mL/min (ref >60)
Glucose, Bld: 238 mg/dL — ABNORMAL HIGH (ref 70–99)
Potassium: 3.7 mmol/L (ref 3.5–5.1)
Sodium: 140 mmol/L (ref 135–145)
Total Bilirubin: 0.6 mg/dL (ref 0.3–1.2)
Total Protein: 6.6 g/dL (ref 6.5–8.1)

## 2022-08-22 LAB — CBC WITH DIFFERENTIAL (CANCER CENTER ONLY)
Abs Immature Granulocytes: 0.01 10*3/uL (ref 0.00–0.07)
Basophils Absolute: 0 10*3/uL (ref 0.0–0.1)
Basophils Relative: 0 %
Eosinophils Absolute: 0.2 10*3/uL (ref 0.0–0.5)
Eosinophils Relative: 4 %
HCT: 38.2 % — ABNORMAL LOW (ref 39.0–52.0)
Hemoglobin: 13.3 g/dL (ref 13.0–17.0)
Immature Granulocytes: 0 %
Lymphocytes Relative: 20 %
Lymphs Abs: 0.9 10*3/uL (ref 0.7–4.0)
MCH: 30.8 pg (ref 26.0–34.0)
MCHC: 34.8 g/dL (ref 30.0–36.0)
MCV: 88.4 fL (ref 80.0–100.0)
Monocytes Absolute: 0.4 10*3/uL (ref 0.1–1.0)
Monocytes Relative: 9 %
Neutro Abs: 3.2 10*3/uL (ref 1.7–7.7)
Neutrophils Relative %: 67 %
Platelet Count: 209 10*3/uL (ref 150–400)
RBC: 4.32 MIL/uL (ref 4.22–5.81)
RDW: 13.1 % (ref 11.5–15.5)
WBC Count: 4.7 10*3/uL (ref 4.0–10.5)
nRBC: 0 % (ref 0.0–0.2)

## 2022-08-22 LAB — TSH: TSH: 1.821 u[IU]/mL (ref 0.350–4.500)

## 2022-08-22 MED ORDER — SODIUM CHLORIDE 0.9 % IV SOLN: Freq: Once | INTRAVENOUS | Status: AC

## 2022-08-22 MED ORDER — SODIUM CHLORIDE 0.9 % IV SOLN
350.0000 mg | Freq: Once | INTRAVENOUS | Status: AC
  Administered 2022-08-22: 350 mg via INTRAVENOUS
  Filled 2022-08-22: qty 7

## 2022-08-22 NOTE — Progress Notes (Signed)
Patient seen by MD today  Vitals are within treatment parameters.  Labs reviewed: and are within treatment parameters.  Per physician team, patient is ready for treatment and there are NO modifications to the treatment plan.

## 2022-08-22 NOTE — Patient Instructions (Signed)
[facility]  Discharge Instructions: Thank you for choosing [facility] to provide your oncology and hematology care.   If you have a lab appointment with the Cancer Center, please go directly to the Cancer Center and check in at the registration area.   Wear comfortable clothing and clothing appropriate for easy access to any Portacath or PICC line.   We strive to give you quality time with your provider. You may need to reschedule your appointment if you arrive late (15 or more minutes).  Arriving late affects you and other patients whose appointments are after yours.  Also, if you miss three or more appointments without notifying the office, you may be dismissed from the clinic at the provider's discretion.      For prescription refill requests, have your pharmacy contact our office and allow 72 hours for refills to be completed.    Today you received the following chemotherapy and/or immunotherapy agents: Libtayo.       To help prevent nausea and vomiting after your treatment, we encourage you to take your nausea medication as directed.  BELOW ARE SYMPTOMS THAT SHOULD BE REPORTED IMMEDIATELY: *FEVER GREATER THAN 100.4 F (38 C) OR HIGHER *CHILLS OR SWEATING *NAUSEA AND VOMITING THAT IS NOT CONTROLLED WITH YOUR NAUSEA MEDICATION *UNUSUAL SHORTNESS OF BREATH *UNUSUAL BRUISING OR BLEEDING *URINARY PROBLEMS (pain or burning when urinating, or frequent urination) *BOWEL PROBLEMS (unusual diarrhea, constipation, pain near the anus) TENDERNESS IN MOUTH AND THROAT WITH OR WITHOUT PRESENCE OF ULCERS (sore throat, sores in mouth, or a toothache) UNUSUAL RASH, SWELLING OR PAIN  UNUSUAL VAGINAL DISCHARGE OR ITCHING   Items with * indicate a potential emergency and should be followed up as soon as possible or go to the Emergency Department if any problems should occur.  Please show the CHEMOTHERAPY ALERT CARD or IMMUNOTHERAPY ALERT CARD at  check-in to the Emergency Department and triage nurse.  Should you have questions after your visit or need to cancel or reschedule your appointment, please contact [facility]  Dept: [phone]  and follow the prompts.  Office hours are 8:00 a.m. to 4:30 p.m. Monday - Friday. Please note that voicemails left after 4:00 p.m. may not be returned until the following business day.  We are closed weekends and major holidays. You have access to a nurse at all times for urgent questions. Please call the main number to the clinic Dept: [phone] and follow the prompts.   For any non-urgent questions, you may also contact your provider using MyChart. We now offer e-Visits for anyone 18 and older to request care online for non-urgent symptoms. For details visit [email or web address].   Also download the MyChart app! Go to the app store, search "MyChart", open the app, select [facility], and log in with your MyChart username and password.   

## 2022-08-22 NOTE — Progress Notes (Signed)
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 He initially presented with skin cancer around the parotid gland in April 2022.   PRIOR THERAPY: 1) status post left parotidectomy with skin resection and rotational flap completed by Dr. Constance Holster in April 2022. Final pathology showed squamous cell carcinoma.  2) status post radiation therapy under the care of Dr. Isidore Moos completing 33 fractions on December 19, 2020 for a total of [place] THERAPY: Libtayo (Cempilimab) 350 Mg IV every 3 weeks started August 10, 2021.  Status post 17 cycles of treatment.  INTERVAL HISTORY: Shaun Mcmillan 82 y.o. male returns to the clinic today for follow-up visit.  The patient is feeling fine today with no concerning complaints except for 1 episode of diarrhea after his last treatment.  He denied having any chest pain, shortness of breath, cough or hemoptysis.  He has no nausea, vomiting, diarrhea or constipation.  He has no headache or visual changes.  He lost few pounds since his last visit.  The patient is here today for evaluation before starting cycle #18.  MEDICAL HISTORY: Past Medical History:  Diagnosis Date   Arthritis    Diabetes mellitus type II    Glaucoma    sees Dr. 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  MEDICATIONS:  Current Outpatient Medications  Medication Sig Dispense Refill   acetaminophen (TYLENOL) 500 MG tablet Take 1,000 mg by mouth every 8 (eight) hours as needed for moderate pain.     amLODipine (NORVASC) 10 MG tablet TAKE 1  TABLET BY MOUTH DAILY 90 tablet 3   aspirin EC 81 MG tablet Take 1 tablet (81 mg total) by mouth daily. Swallow whole. 30 tablet 11   atorvastatin (LIPITOR) 40 MG tablet TAKE 1 TABLET BY MOUTH DAILY 90 tablet 3   Cholecalciferol (VITAMIN D PO) Take 5,000 Units by mouth daily.     gabapentin (NEURONTIN) 300 MG capsule TAKE 2 CAPSULES BY MOUTH 3 TIMES A DAY 180 capsule 1   latanoprost (XALATAN) 0.005 % ophthalmic solution Place 1 drop into both eyes every morning.     LORazepam (ATIVAN) 1 MG tablet Take 1 tablet (1 mg total) by mouth 3 (three) times daily as needed for anxiety. 60 tablet 5   Melatonin 5 MG TABS Take 5 mg by mouth at bedtime.     metFORMIN (GLUCOPHAGE) 500 MG tablet TAKE 1 TABLET BY MOUTH TWICE  DAILY WITH A MEAL 180 tablet 3   Multiple Vitamin (MULTIVITAMIN) tablet Take 1 tablet by mouth daily.     nystatin (MYCOSTATIN) 100000 UNIT/ML suspension Take 5 mLs (500,000 Units total) by mouth 4 (four) times daily. 60 mL 0   prochlorperazine (COMPAZINE) 10 MG tablet Take 1 tablet (10 mg total) by mouth every 6 (six) hours as needed for nausea or vomiting. 30 tablet 0   ramipril (ALTACE) 10 MG capsule TAKE 1 CAPSULE BY MOUTH TWICE  DAILY 180 capsule 3   timolol (BETIMOL)  0.5 % ophthalmic solution Place 1 drop into both eyes daily.     No current facility-administered medications for this visit.    SURGICAL HISTORY:  Past Surgical History:  Procedure Laterality Date   APPENDECTOMY     colonscopy  03/12/2016   per Dr. Watt Climes, benign polyps, repeat in 5 yrs   left craniotomy  11/03/08   per Dr. Sherley Bounds for a subdrual hematoma   PAROTIDECTOMY Left 09/20/2020   Procedure: PAROTIDECTOMY with resection of skin and rotational flap;  Surgeon: Izora Gala, MD;  Location: [facility];  Service: ENT;  Laterality: Left;   surgery for ocmpound fracture     right leg    REVIEW OF SYSTEMS:  A comprehensive review of systems was negative except for: Constitutional: positive for weight loss    PHYSICAL EXAMINATION: General appearance: alert, cooperative, and no distress Head: Normocephalic, without obvious abnormality, atraumatic Neck: no adenopathy, no JVD, supple, symmetrical, trachea midline, and thyroid not enlarged, symmetric, no tenderness/mass/nodules Lymph nodes: Cervical, supraclavicular, and axillary nodes normal. Resp: clear to auscultation bilaterally Back: symmetric, no curvature. ROM normal. No CVA tenderness. Cardio: regular rate and rhythm, S1, S2 normal, no murmur, click, rub or gallop GI: soft, non-tender; bowel sounds normal; no masses,  no organomegaly Extremities: extremities normal, atraumatic, no cyanosis or edema  ECOG PERFORMANCE STATUS: 1 - Symptomatic but completely ambulatory  Blood pressure (!) 152/89, pulse 65, temperature 97.7 F (36.5 C), temperature source Oral, resp. rate 18, weight 133 lb 2 oz (60.4 kg), SpO2 99 %.  LABORATORY DATA: Lab Results  Component Value Date   WBC 4.7 08/22/2022   HGB 13.3 08/22/2022   HCT 38.2 (L) 08/22/2022   MCV 88.4 08/22/2022   PLT 209 08/22/2022      Chemistry      Component Value Date/Time   NA 140 08/22/2022 0812   K 3.7 08/22/2022 0812   CL 103 08/22/2022 0812   CO2 31 08/22/2022 0812   BUN 14 08/22/2022 0812   CREATININE 0.92 08/22/2022 0812   CREATININE 0.69 (L) 01/08/2021 1553      Component Value Date/Time   CALCIUM 9.4 08/22/2022 0812   ALKPHOS 87 08/22/2022 0812   AST 18 08/22/2022 0812   ALT 14 08/22/2022 0812   BILITOT 0.6 08/22/2022 0812       RADIOGRAPHIC STUDIES: No results found.  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  He had evidence for disease progression and metastasis to the lung in  February 2023. The patient is currently undergoing treatment with immunotherapy with Libtayo (Cempilimab) 350 Mg IV every 3 weeks status post 17 cycles.  The patient has been tolerating this treatment well with no concerning adverse effects except for occasional itching and diarrhea. I recommended for him to proceed with cycle #18 today as planned. I will consider repeating imaging studies after the next cycle of his treatment. The patient was advised to call immediately if he has any concerning symptoms in the interval. The patient voices understanding of current disease status and treatment options and is in agreement with the current care plan.  All questions were answered. The patient knows to call the clinic with any problems, questions or concerns. We can certainly see the patient much sooner  if necessary.  The total time spent in the appointment was 20 minutes.  Disclaimer: This note was dictated with voice recognition software. Similar sounding words can inadvertently be transcribed and may not be corrected upon review.

## 2022-08-23 LAB — T4: T4, Total: 6.6 ug/dL (ref 4.5–12.0)

## 2022-08-28 ENCOUNTER — Ambulatory Visit (INDEPENDENT_AMBULATORY_CARE_PROVIDER_SITE_OTHER): Payer: Medicare Other | Admitting: Family Medicine

## 2022-08-28 ENCOUNTER — Encounter: Payer: Self-pay | Admitting: Family Medicine

## 2022-08-28 VITALS — BP 122/80 | HR 56 | Temp 97.8°F | Ht 70.0 in | Wt 132.0 lb

## 2022-08-28 DIAGNOSIS — N401 Enlarged prostate with lower urinary tract symptoms: Secondary | ICD-10-CM | POA: Diagnosis not present

## 2022-08-28 DIAGNOSIS — E538 Deficiency of other specified B group vitamins: Secondary | ICD-10-CM | POA: Diagnosis not present

## 2022-08-28 DIAGNOSIS — M1000 Idiopathic gout, unspecified site: Secondary | ICD-10-CM | POA: Diagnosis not present

## 2022-08-28 DIAGNOSIS — E785 Hyperlipidemia, unspecified: Secondary | ICD-10-CM

## 2022-08-28 DIAGNOSIS — N138 Other obstructive and reflux uropathy: Secondary | ICD-10-CM | POA: Diagnosis not present

## 2022-08-28 DIAGNOSIS — R202 Paresthesia of skin: Secondary | ICD-10-CM | POA: Diagnosis not present

## 2022-08-28 DIAGNOSIS — E559 Vitamin D deficiency, unspecified: Secondary | ICD-10-CM | POA: Diagnosis not present

## 2022-08-28 DIAGNOSIS — R200 Anesthesia of skin: Secondary | ICD-10-CM

## 2022-08-28 DIAGNOSIS — C7989 Secondary malignant neoplasm of other specified sites: Secondary | ICD-10-CM | POA: Diagnosis not present

## 2022-08-28 DIAGNOSIS — I10 Essential (primary) hypertension: Secondary | ICD-10-CM | POA: Diagnosis not present

## 2022-08-28 DIAGNOSIS — E118 Type 2 diabetes mellitus with unspecified complications: Secondary | ICD-10-CM

## 2022-08-28 LAB — PSA: PSA: 1.7 ng/mL (ref 0.10–4.00)

## 2022-08-28 LAB — LIPID PANEL
Cholesterol: 145 mg/dL (ref 0–200)
HDL: 61.6 mg/dL (ref >39.00)
LDL Cholesterol: 73 mg/dL (ref 0–99)
NonHDL: 83.49
Total CHOL/HDL Ratio: 2
Triglycerides: 54 mg/dL (ref 0.0–149.0)
VLDL: 10.8 mg/dL (ref 0.0–40.0)

## 2022-08-28 LAB — HEMOGLOBIN A1C: Hgb A1c MFr Bld: 5.8 % (ref 4.6–6.5)

## 2022-08-28 LAB — VITAMIN B12: Vitamin B-12: 360 pg/mL (ref 211–911)

## 2022-08-28 LAB — VITAMIN D 25 HYDROXY (VIT D DEFICIENCY, FRACTURES): VITD: 39.62 ng/mL (ref 30.00–100.00)

## 2022-08-28 MED ORDER — LOSARTAN POTASSIUM 25 MG PO TABS: 25.0000 mg | ORAL_TABLET | Freq: Every day | ORAL | 3 refills | Status: DC

## 2022-08-28 NOTE — Progress Notes (Signed)
Subjective:    Patient ID: Shaun Mcmillan, male    DOB: [birth year], 82 y.o.   MRN: [id number]  HPI Here to follow up on issues. He is doing fairly well. He has been getting chemotherapy per his oncologist, Dr. 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 He gets some nausea and loss of appetite with the treatments, and he has lost about 25 lbs during this process. He has labs drawn frequently at the oncology clinic, and the ones from 08-22-22 were notable for a normal creatinine of 0.92 and a normal Hgb of 13.3. He has not been bothered by gout for several years. His BP has been stable.    Review of Systems  Constitutional:  Positive for appetite change and unexpected weight change.  HENT: Negative.    Eyes: Negative.   Respiratory: Negative.    Cardiovascular: Negative.   Gastrointestinal: Negative.   Genitourinary: Negative.   Musculoskeletal: Negative.   Skin: Negative.   Neurological: Negative.   Psychiatric/Behavioral: Negative.         Objective:   Physical Exam Constitutional:      General: He is not in acute distress.    Appearance: He is well-developed. He is not diaphoretic.     Comments: He is quite thin   HENT:     Head: Normocephalic and atraumatic.     Right Ear: External ear normal.     Left Ear: External ear normal.     Nose: Nose normal.     Mouth/Throat:     Pharynx: No oropharyngeal exudate.  Eyes:     General: No scleral icterus.       Right eye: No discharge.        Left eye: No discharge.     Conjunctiva/sclera: Conjunctivae normal.     Pupils: Pupils are equal, round, and reactive to light.  Neck:     Thyroid: No thyromegaly.     Vascular: No JVD.     Trachea: No tracheal deviation.  Cardiovascular:     Rate and Rhythm: Normal rate and regular rhythm.     Pulses: Normal pulses.     Heart sounds: Normal heart sounds. No murmur heard.    No friction rub. No gallop.  Pulmonary:     Effort: Pulmonary effort is  normal. No respiratory distress.     Breath sounds: Normal breath sounds. No wheezing or rales.  Chest:     Chest wall: No tenderness.  Abdominal:     General: Bowel sounds are normal. There is no distension.     Palpations: Abdomen is soft. There is no mass.     Tenderness: There is no abdominal tenderness. There is no guarding or rebound.  Genitourinary:    Penis: Normal. No tenderness.      Testes: Normal.  Musculoskeletal:        General: No tenderness. Normal range of motion.     Cervical back: Normal range of motion and neck supple.  Lymphadenopathy:     Cervical: No cervical adenopathy.  Skin:    General: Skin is warm and dry.     Coloration: Skin is not pale.     Findings: No erythema or rash.  Neurological:     General: No focal deficit present.     Mental Status: He is alert and oriented to person, place, and time.     Cranial Nerves: No cranial nerve deficit.     Motor: No abnormal muscle tone.  Coordination: Coordination normal.     Deep Tendon Reflexes: Reflexes are normal and symmetric. Reflexes normal.  Psychiatric:        Mood and Affect: Mood normal.        Behavior: Behavior normal.        Thought Content: Thought content normal.        Judgment: Judgment normal.           Assessment & Plan:  He is finishing up chemotherapy for the metastatic squamous cell cancer. He has lost weight, but hopefully when this is finished his appetite will return to normal. His gout and HTN are stable. He is fasting so we will get labs to check lipids, an A1c, etc. We spent a total of (34   ) minutes reviewing records and discussing these issues.  Alysia Penna, MD

## 2022-09-09 NOTE — Progress Notes (Unsigned)
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 He initially presented with skin cancer around the parotid gland in April 2022.    PRIOR THERAPY: 1) status post left parotidectomy with skin resection and rotational flap completed by Dr. Constance Holster in April 2022. Final pathology showed squamous cell carcinoma.  2) status post radiation therapy under the care of Dr. Isidore Moos completing 33 fractions on December 19, 2020 for a total of [place] THERAPY: Libtayo (Cempilimab) 350 Mg IV every 3 weeks started August 10, 2021. Status post 18 cycles of treatment.   INTERVAL HISTORY: Shaun Mcmillan 82 y.o. male returns to the clinic today for a follow-up visit. The patient was last seen 3 weeks ago by Dr. Julien Nordmann.  The patient is currently undergoing immunotherapy with Libtayo every 3 weeks.  He tolerates it well without any concerning adverse side effects.  He is scheduled to see dermatology tomorrow. He denies any fever, chills, or night sweats. Denies any chest pain, shortness of breath, or hemoptysis. He denies significant cough. Denies any nausea, vomiting, diarrhea, or constipation.  Denies any headache or visual changes.  Denies any rashes or skin changes.  He is here today for evaluation repeat blood work before undergoing cycle #19.    MEDICAL HISTORY: Past Medical History:  Diagnosis Date   Arthritis    Diabetes mellitus type II    Glaucoma    sees Dr. 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  MEDICATIONS:  Current Outpatient Medications  Medication Sig Dispense Refill   acetaminophen (TYLENOL)  500 MG tablet Take 1,000 mg by mouth every 8 (eight) hours as needed for moderate pain.     amLODipine (NORVASC) 10 MG tablet TAKE 1 TABLET BY MOUTH DAILY 90 tablet 3   aspirin EC 81 MG tablet Take 1 tablet (81 mg total) by mouth daily. Swallow whole. 30 tablet 11   atorvastatin (LIPITOR) 40 MG tablet TAKE 1 TABLET BY MOUTH DAILY 90 tablet 3   Cholecalciferol (VITAMIN D PO) Take 5,000 Units by mouth daily.     gabapentin (NEURONTIN) 300 MG capsule TAKE 2 CAPSULES BY MOUTH 3 TIMES A DAY 180 capsule 1   latanoprost (XALATAN) 0.005 % ophthalmic solution Place 1 drop into both eyes every morning. [facility]  [address]     LORazepam (ATIVAN) 1 MG tablet Take 1 tablet (1 mg total) by mouth 3 (three) times daily as needed for anxiety. 60 tablet 5   losartan (COZAAR) 25 MG tablet Take 1 tablet (25 mg total) by mouth daily. 90 tablet 3   Melatonin 5 MG TABS Take 5 mg by mouth at bedtime. [facility] [address]     metFORMIN (GLUCOPHAGE) 500 MG tablet TAKE 1 TABLET BY MOUTH TWICE  DAILY WITH A MEAL 180 tablet 3   Multiple Vitamin (MULTIVITAMIN) tablet Take 1 tablet by mouth daily.     nystatin (MYCOSTATIN) 100000 UNIT/ML suspension Take 5 mLs (500,000 Units total) by mouth 4 (four) times daily. 60 mL 0   prochlorperazine (COMPAZINE) 10  MG tablet Take 1 tablet (10 mg total) by mouth every 6 (six) hours as needed for nausea or vomiting. 30 tablet 0   timolol (BETIMOL) 0.5 % ophthalmic solution Place 1 drop into both eyes daily. [facility] [address]     No current facility-administered medications for this visit.   Facility-Administered Medications Ordered in Other Visits  Medication Dose Route Frequency Provider Last Rate Last Admin   0.9 %  sodium chloride infusion   Intravenous Once Curt Bears, MD       cemiplimab-rwlc (LIBTAYO) 350 mg in sodium chloride 0.9 % 100 mL chemo infusion  350 mg Intravenous Once Curt Bears, MD        SURGICAL HISTORY:  Past Surgical History:  Procedure Laterality Date    APPENDECTOMY     colonscopy  03/12/2016   per Dr. Watt Climes, benign polyps, repeat in 5 yrs   left craniotomy  11/03/08   per Dr. Sherley Bounds for a subdrual hematoma   PAROTIDECTOMY Left 09/20/2020   Procedure: PAROTIDECTOMY with resection of skin and rotational flap;  Surgeon: Izora Gala, MD;  Location: [facility];  Service: ENT;  Laterality: Left;   surgery for ocmpound fracture     right leg    REVIEW OF SYSTEMS:   Review of Systems  Constitutional: Negative for appetite change, chills, fatigue, fever and unexpected weight change.  HENT: Positive for some paralysis on left cheek/face. Positive for black growth on tongue. Negative for mouth sores, nosebleeds, sore throat.  Eyes: Negative for eye problems and icterus.  Respiratory: Negative for hemoptysis, cough, shortness of breath and wheezing.   Cardiovascular: Negative for chest pain and leg swelling.  Gastrointestinal: Negative for abdominal pain, constipation, diarrhea, nausea and vomiting.  Genitourinary: Negative for bladder incontinence, difficulty urinating, dysuria, frequency and hematuria.   Musculoskeletal: Negative for back pain, gait problem, neck pain and neck stiffness.  Skin: Negative for itching and rash.  Neurological: Negative for dizziness, extremity weakness, gait problem, headaches, light-headedness and seizures.  Hematological: Negative for adenopathy. Does not bruise/bleed easily.  Psychiatric/Behavioral: Negative for confusion, depression and sleep disturbance. The patient is not nervous/anxious.     PHYSICAL EXAMINATION:  Blood pressure 136/71, pulse 97, temperature 97.9 F (36.6 C), temperature source Oral, resp. rate 14, height 5\' 10"  (1.778 m), weight 130 lb 12.8 oz (59.3 kg), SpO2 98 %.  ECOG PERFORMANCE STATUS: 1  Physical Exam  Eyes: Conjunctivae are normal. Right eye exhibits no discharge. Left eye exhibits no discharge. No scleral icterus.  Neck: Normal range of motion. Neck supple.  Cardiovascular:  Normal rate, regular rhythm, normal heart sounds and intact distal pulses.   Pulmonary/Chest: Effort normal and breath sounds normal. No respiratory distress. No wheezes. No rales.  Abdominal: Soft. Bowel sounds are normal. Exhibits no distension and no mass. There is no tenderness.  Musculoskeletal: Normal range of motion. Exhibits no edema.  Lymphadenopathy:    No cervical adenopathy.  Neurological: Alert and oriented to person, place, and time. Exhibits normal muscle tone. Gait normal. Coordination normal.  Skin: Skin is warm and dry. No rash noted. Not diaphoretic. No erythema. No pallor.  Psychiatric: Mood, memory and judgment normal.  Vitals reviewed.    LABORATORY DATA: Lab Results  Component Value Date   WBC 6.1 09/11/2022   HGB 13.7 09/11/2022   HCT 39.4 09/11/2022   MCV 87.8 09/11/2022   PLT 215 09/11/2022      Chemistry      Component Value Date/Time   NA 143 09/11/2022  1405   K 3.6 09/11/2022 1405   CL 105 09/11/2022 1405   CO2 32 09/11/2022 1405   BUN 23 09/11/2022 1405   CREATININE 0.93 09/11/2022 1405   CREATININE 0.69 (L) 01/08/2021 1553      Component Value Date/Time   CALCIUM 9.9 09/11/2022 1405   ALKPHOS 88 09/11/2022 1405   AST 16 09/11/2022 1405   ALT 15 09/11/2022 1405   BILITOT 0.6 09/11/2022 1405       RADIOGRAPHIC STUDIES:  No results found.   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  He was initially diagnosed in April 2022 with involvement of the skin around the left parotid gland status post left parotidectomy with skin resection followed by adjuvant radiation.  He then had evidence of disease progression with metastases to the lung in February 2023.   The patient is currently undergoing systemic treatment with immunotherapy with Libtayo 350 mg IV every 3 weeks.  He is status post 18 cycles.  The plan is to complete 2 years of therapy  as long as he is tolerating well and there is no evidence of disease progression or unacceptable toxicity.   Labs were reviewed.  Recommend that she proceed with cycle number 19 today scheduled.   We will see him back for follow-up visit in 3 weeks for evaluation repeat blood work before undergoing cycle #20.  The patient was advised to call immediately if he has any concerning symptoms in the interval. The patient voices understanding of current disease status and treatment options and is in agreement with the current care plan. All questions were answered. The patient knows to call the clinic with any problems, questions or concerns. We can certainly see the patient much sooner if necessary  No orders of the defined types were placed in this encounter.   The total time spent in the appointment was 20-29 minutes  Alyviah Crandle L Shalisha Clausing, PA-C 09/11/22

## 2022-09-11 ENCOUNTER — Inpatient Hospital Stay (HOSPITAL_BASED_OUTPATIENT_CLINIC_OR_DEPARTMENT_OTHER): Payer: Medicare Other | Admitting: Physician Assistant

## 2022-09-11 ENCOUNTER — Inpatient Hospital Stay: Payer: Medicare Other

## 2022-09-11 VITALS — BP 136/71 | HR 97 | Temp 97.9°F | Resp 14 | Ht 70.0 in | Wt 130.8 lb

## 2022-09-11 DIAGNOSIS — C7800 Secondary malignant neoplasm of unspecified lung: Secondary | ICD-10-CM | POA: Diagnosis not present

## 2022-09-11 DIAGNOSIS — C7989 Secondary malignant neoplasm of other specified sites: Secondary | ICD-10-CM

## 2022-09-11 DIAGNOSIS — C44329 Squamous cell carcinoma of skin of other parts of face: Secondary | ICD-10-CM | POA: Diagnosis not present

## 2022-09-11 DIAGNOSIS — Z79620 Long term (current) use of immunosuppressive biologic: Secondary | ICD-10-CM | POA: Diagnosis not present

## 2022-09-11 DIAGNOSIS — Z5112 Encounter for antineoplastic immunotherapy: Secondary | ICD-10-CM | POA: Diagnosis not present

## 2022-09-11 LAB — CMP (CANCER CENTER ONLY)
ALT: 15 U/L (ref 0–44)
AST: 16 U/L (ref 15–41)
Albumin: 4.2 g/dL (ref 3.5–5.0)
Alkaline Phosphatase: 88 U/L (ref 38–126)
Anion gap: 6 (ref 5–15)
BUN: 23 mg/dL (ref 8–23)
CO2: 32 mmol/L (ref 22–32)
Calcium: 9.9 mg/dL (ref 8.9–10.3)
Chloride: 105 mmol/L (ref 98–111)
Creatinine: 0.93 mg/dL (ref 0.61–1.24)
GFR, Estimated: >60 mL/min (ref >60)
Glucose, Bld: 167 mg/dL — ABNORMAL HIGH (ref 70–99)
Potassium: 3.6 mmol/L (ref 3.5–5.1)
Sodium: 143 mmol/L (ref 135–145)
Total Bilirubin: 0.6 mg/dL (ref 0.3–1.2)
Total Protein: 6.8 g/dL (ref 6.5–8.1)

## 2022-09-11 LAB — CBC WITH DIFFERENTIAL (CANCER CENTER ONLY)
Abs Immature Granulocytes: 0.01 10*3/uL (ref 0.00–0.07)
Basophils Absolute: 0 10*3/uL (ref 0.0–0.1)
Basophils Relative: 0 %
Eosinophils Absolute: 0.1 10*3/uL (ref 0.0–0.5)
Eosinophils Relative: 1 %
HCT: 39.4 % (ref 39.0–52.0)
Hemoglobin: 13.7 g/dL (ref 13.0–17.0)
Immature Granulocytes: 0 %
Lymphocytes Relative: 21 %
Lymphs Abs: 1.3 10*3/uL (ref 0.7–4.0)
MCH: 30.5 pg (ref 26.0–34.0)
MCHC: 34.8 g/dL (ref 30.0–36.0)
MCV: 87.8 fL (ref 80.0–100.0)
Monocytes Absolute: 0.4 10*3/uL (ref 0.1–1.0)
Monocytes Relative: 7 %
Neutro Abs: 4.3 10*3/uL (ref 1.7–7.7)
Neutrophils Relative %: 71 %
Platelet Count: 215 10*3/uL (ref 150–400)
RBC: 4.49 MIL/uL (ref 4.22–5.81)
RDW: 13.5 % (ref 11.5–15.5)
WBC Count: 6.1 10*3/uL (ref 4.0–10.5)
nRBC: 0 % (ref 0.0–0.2)

## 2022-09-11 LAB — TSH: TSH: 1.149 u[IU]/mL (ref 0.350–4.500)

## 2022-09-11 MED ORDER — SODIUM CHLORIDE 0.9 % IV SOLN
350.0000 mg | Freq: Once | INTRAVENOUS | Status: AC
  Administered 2022-09-11: 350 mg via INTRAVENOUS
  Filled 2022-09-11: qty 7

## 2022-09-11 MED ORDER — SODIUM CHLORIDE 0.9 % IV SOLN: Freq: Once | INTRAVENOUS | Status: AC

## 2022-09-11 NOTE — Patient Instructions (Signed)
[facility]  Discharge Instructions: Thank you for choosing [facility] to provide your oncology and hematology care.   If you have a lab appointment with the Cancer Center, please go directly to the Cancer Center and check in at the registration area.   Wear comfortable clothing and clothing appropriate for easy access to any Portacath or PICC line.   We strive to give you quality time with your provider. You may need to reschedule your appointment if you arrive late (15 or more minutes).  Arriving late affects you and other patients whose appointments are after yours.  Also, if you miss three or more appointments without notifying the office, you may be dismissed from the clinic at the provider's discretion.      For prescription refill requests, have your pharmacy contact our office and allow 72 hours for refills to be completed.    Today you received the following chemotherapy and/or immunotherapy agents Libtayo.   To help prevent nausea and vomiting after your treatment, we encourage you to take your nausea medication as directed.  BELOW ARE SYMPTOMS THAT SHOULD BE REPORTED IMMEDIATELY: *FEVER GREATER THAN 100.4 F (38 C) OR HIGHER *CHILLS OR SWEATING *NAUSEA AND VOMITING THAT IS NOT CONTROLLED WITH YOUR NAUSEA MEDICATION *UNUSUAL SHORTNESS OF BREATH *UNUSUAL BRUISING OR BLEEDING *URINARY PROBLEMS (pain or burning when urinating, or frequent urination) *BOWEL PROBLEMS (unusual diarrhea, constipation, pain near the anus) TENDERNESS IN MOUTH AND THROAT WITH OR WITHOUT PRESENCE OF ULCERS (sore throat, sores in mouth, or a toothache) UNUSUAL RASH, SWELLING OR PAIN  UNUSUAL VAGINAL DISCHARGE OR ITCHING   Items with * indicate a potential emergency and should be followed up as soon as possible or go to the Emergency Department if any problems should occur.  Please show the CHEMOTHERAPY ALERT CARD or IMMUNOTHERAPY ALERT CARD at check-in  to the Emergency Department and triage nurse.  Should you have questions after your visit or need to cancel or reschedule your appointment, please contact [facility]  Dept: [phone]  and follow the prompts.  Office hours are 8:00 a.m. to 4:30 p.m. Monday - Friday. Please note that voicemails left after 4:00 p.m. may not be returned until the following business day.  We are closed weekends and major holidays. You have access to a nurse at all times for urgent questions. Please call the main number to the clinic Dept: [phone] and follow the prompts.   For any non-urgent questions, you may also contact your provider using MyChart. We now offer e-Visits for anyone 18 and older to request care online for non-urgent symptoms. For details visit [email or web address].   Also download the MyChart app! Go to the app store, search "MyChart", open the app, select [facility], and log in with your MyChart username and password.   

## 2022-09-12 ENCOUNTER — Ambulatory Visit: Payer: Medicare Other

## 2022-09-12 ENCOUNTER — Other Ambulatory Visit: Payer: Medicare Other

## 2022-09-12 ENCOUNTER — Ambulatory Visit: Payer: Medicare Other | Admitting: Internal Medicine

## 2022-09-12 DIAGNOSIS — L570 Actinic keratosis: Secondary | ICD-10-CM | POA: Diagnosis not present

## 2022-09-12 DIAGNOSIS — L821 Other seborrheic keratosis: Secondary | ICD-10-CM | POA: Diagnosis not present

## 2022-09-12 DIAGNOSIS — Z85828 Personal history of other malignant neoplasm of skin: Secondary | ICD-10-CM | POA: Diagnosis not present

## 2022-09-12 DIAGNOSIS — D225 Melanocytic nevi of trunk: Secondary | ICD-10-CM | POA: Diagnosis not present

## 2022-09-12 LAB — T4: T4, Total: 6.6 ug/dL (ref 4.5–12.0)

## 2022-09-13 ENCOUNTER — Telehealth: Payer: Self-pay | Admitting: Internal Medicine

## 2022-09-13 DIAGNOSIS — H9113 Presbycusis, bilateral: Secondary | ICD-10-CM | POA: Diagnosis not present

## 2022-09-13 DIAGNOSIS — C07 Malignant neoplasm of parotid gland: Secondary | ICD-10-CM | POA: Diagnosis not present

## 2022-09-13 DIAGNOSIS — H6122 Impacted cerumen, left ear: Secondary | ICD-10-CM | POA: Diagnosis not present

## 2022-09-13 DIAGNOSIS — R1314 Dysphagia, pharyngoesophageal phase: Secondary | ICD-10-CM | POA: Diagnosis not present

## 2022-09-13 NOTE — Telephone Encounter (Signed)
Scheduled per 03/27 los, patient has been called and notified of upcoming appointments.

## 2022-09-17 ENCOUNTER — Other Ambulatory Visit (HOSPITAL_COMMUNITY): Payer: Self-pay | Admitting: Otolaryngology

## 2022-09-17 DIAGNOSIS — R1314 Dysphagia, pharyngoesophageal phase: Secondary | ICD-10-CM

## 2022-09-30 NOTE — Progress Notes (Unsigned)
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 He initially presented with skin cancer around the parotid gland in April 2022.    PRIOR THERAPY: 1) status post left parotidectomy with skin resection and rotational flap completed by Dr. Pollyann Kennedy in April 2022. Final pathology showed squamous cell carcinoma.  2) status post radiation therapy under the care of Dr. Basilio Cairo completing 33 fractions on December 19, 2020 for a total of 66 Gray   CURRENT THERAPY: Libtayo (Cempilimab) 350 Mg IV every 3 weeks started August 10, 2021. Status post 19 cycles of treatment   INTERVAL HISTORY: Shaun Mcmillan 82 y.o. male returns to the clinic today for a follow-up visit. The patient was last seen 3 weeks ago.  The patient is currently undergoing immunotherapy with Libtayo every 3 weeks.  He tolerates it well without any concerning adverse side effects.  He saw dermatology since last being seen and he states everything was fine. He also saw Dr. Pollyann Kennedy from ENT. He had a swallow study in which he aspirated. A speech therapist is supposed to come to evaluate him. The swallow study also mentions a masslike lesion along the right lateral wall of the lower cervical esophagus. This favors and extrinsic lesion such as a right thyroid lobe nodule over an intrinsic esophageal mass. His last CT scan was in January. He denies any fever, chills, or night sweats. Denies any chest pain, shortness of breath, or hemoptysis. He denies significant cough. Denies any nausea, vomiting, diarrhea, or constipation.  Denies any headache or visual changes.  Denies any rashes or skin changes.  He is scheduled to see Dermatology again on 11/28/22. He is here today for evaluation repeat blood work before undergoing cycle #20.    MEDICAL HISTORY: Past Medical History:  Diagnosis Date    Arthritis    Diabetes mellitus type II    Glaucoma    sees Dr. 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  MEDICATIONS:  Current Outpatient Medications  Medication Sig Dispense Refill   acetaminophen (TYLENOL) 500 MG tablet Take 1,000 mg by mouth every 8 (eight) hours as needed for moderate pain.     amLODipine (NORVASC) 10 MG tablet TAKE 1 TABLET BY MOUTH DAILY 90 tablet 3   aspirin EC 81 MG tablet Take 1 tablet (81 mg total) by mouth daily. Swallow whole. 30 tablet 11   atorvastatin (LIPITOR) 40 MG tablet TAKE 1 TABLET BY MOUTH DAILY 90 tablet 3   Cholecalciferol (VITAMIN D PO) Take 5,000 Units by mouth daily.     gabapentin (NEURONTIN) 300 MG capsule TAKE 2 CAPSULES BY MOUTH 3 TIMES A DAY 180 capsule 1   latanoprost (XALATAN) 0.005 % ophthalmic solution Place 1 drop into both eyes every morning. [facility]  [address]     LORazepam (ATIVAN) 1 MG tablet Take 1 tablet (1 mg total) by mouth 3 (three) times daily as needed for anxiety. 60 tablet 5   losartan (COZAAR) 25 MG tablet Take 1 tablet (25 mg total) by mouth daily. 90 tablet 3  Melatonin 5 MG TABS Take 5 mg by mouth at bedtime. [facility] [address]     metFORMIN (GLUCOPHAGE) 500 MG tablet TAKE 1 TABLET BY MOUTH TWICE  DAILY WITH A MEAL 180 tablet 3   Multiple Vitamin (MULTIVITAMIN) tablet Take 1 tablet by mouth daily.     nystatin (MYCOSTATIN) 100000 UNIT/ML suspension Take 5 mLs (500,000 Units total) by mouth 4 (four) times daily. 60 mL 0   prochlorperazine (COMPAZINE) 10 MG tablet Take 1 tablet (10 mg total) by mouth every 6 (six) hours as needed for nausea or vomiting. 30 tablet 0   timolol (BETIMOL) 0.5 % ophthalmic solution Place 1 drop into both eyes daily. [facility] [address]     No current facility-administered  medications for this visit.    SURGICAL HISTORY:  Past Surgical History:  Procedure Laterality Date   APPENDECTOMY     colonscopy  03/12/2016   per Dr. Ewing Schlein, benign polyps, repeat in 5 yrs   left craniotomy  11/03/08   per Dr. Marikay Alar for a subdrual hematoma   PAROTIDECTOMY Left 09/20/2020   Procedure: PAROTIDECTOMY with resection of skin and rotational flap;  Surgeon: Serena Colonel, MD;  Location: [facility] OR;  Service: ENT;  Laterality: Left;   surgery for ocmpound fracture     right leg    REVIEW OF SYSTEMS:   Constitutional: Negative for appetite change, chills, fatigue, fever and unexpected weight change.  HENT: Positive for some paralysis on left cheek/face. Positive for black growth on tongue. Negative for mouth sores, nosebleeds, sore throat.  Eyes: Negative for eye problems and icterus.  Respiratory: Negative for hemoptysis, cough, shortness of breath and wheezing.   Cardiovascular: Negative for chest pain and leg swelling.  Gastrointestinal: Negative for abdominal pain, constipation, diarrhea, nausea and vomiting.  Genitourinary: Negative for bladder incontinence, difficulty urinating, dysuria, frequency and hematuria.   Musculoskeletal: Negative for back pain, gait problem, neck pain and neck stiffness.  Skin: Negative for itching and rash.  Neurological: Negative for dizziness, extremity weakness, gait problem, headaches, light-headedness and seizures.  Hematological: Negative for adenopathy. Does not bruise/bleed easily.  Psychiatric/Behavioral: Negative for confusion, depression and sleep disturbance. The patient is not nervous/anxious.         PHYSICAL EXAMINATION:  Blood pressure 120/67, pulse 69, temperature 97.7 F (36.5 C), temperature source Oral, resp. rate 15, weight 130 lb 3.2 oz (59.1 kg), SpO2 99 %.  ECOG PERFORMANCE STATUS: 1  Physical Exam  Eyes: Conjunctivae are normal. Right eye exhibits no discharge. Left eye exhibits no discharge. No scleral icterus.   Neck: Normal range of motion. Neck supple.  Cardiovascular: Normal rate, regular rhythm, normal heart sounds and intact distal pulses.   Pulmonary/Chest: Effort normal and breath sounds normal. No respiratory distress. No wheezes. No rales.  Abdominal: Soft. Bowel sounds are normal. Exhibits no distension and no mass. There is no tenderness.  Musculoskeletal: Normal range of motion. Exhibits no edema.  Lymphadenopathy:    No cervical adenopathy.  Neurological: Alert and oriented to person, place, and time. Exhibits normal muscle tone. Gait normal. Coordination normal.  Skin: Skin is warm and dry. No rash noted. Not diaphoretic. No erythema. No pallor.  Psychiatric: Mood, memory and judgment normal.  Vitals reviewed.  LABORATORY DATA: Lab Results  Component Value Date   WBC 5.8 10/03/2022   HGB 13.7 10/03/2022   HCT 40.0 10/03/2022   MCV 88.5 10/03/2022   PLT 201 10/03/2022      Chemistry      Component  Value Date/Time   NA 141 10/03/2022 0851   K 3.3 (L) 10/03/2022 0851   CL 103 10/03/2022 0851   CO2 30 10/03/2022 0851   BUN 15 10/03/2022 0851   CREATININE 1.02 10/03/2022 0851   CREATININE 0.69 (L) 01/08/2021 1553      Component Value Date/Time   CALCIUM 9.8 10/03/2022 0851   ALKPHOS 97 10/03/2022 0851   AST 18 10/03/2022 0851   ALT 17 10/03/2022 0851   BILITOT 0.7 10/03/2022 0851       RADIOGRAPHIC STUDIES:  DG ESOPHAGUS W SINGLE CM (SOL OR THIN BA)  Result Date: 10/01/2022 CLINICAL DATA:  Patient with a history of left neck squamous cell carcinoma status post surgical resection and radiation therapy. Patient complains of dysphagia with solid foods, and coughing with some meals. EXAM: ESOPHAGUS/BARIUM SWALLOW TECHNIQUE: Single contrast examination was performed using thin liquid barium. This exam was performed by Alwyn Ren, NP, and was supervised and interpreted by Dr. Myles Rosenthal. FLUOROSCOPY: Radiation Exposure Index (as provided by the fluoroscopic device):  2.60 mGy Kerma COMPARISON:  None Available. FINDINGS: Swallowing: Aspiration occurred immediately, with aspirated barium reaching the right mainstem bronchus. Pharynx: Unremarkable. Esophagus: Limited evaluation exam had to be discontinued due to aspiration. Cine loop during swallowing shows a masslike lesion along the right lateral wall of the lower cervical esophagus, which favors an extrinsic lesion over an intrinsic esophageal mass. This may be due to external mass effect by a right thyroid lobe nodule, with intrinsic esophageal mass considered less likely. No evidence of esophageal obstruction, or other definite anatomic abnormality on obtained cine loop. Esophageal motility: Within normal limits. Hiatal Hernia: None seen on this limited exam. IMPRESSION: Moderate aspiration occurred immediately, and exam was therefore discontinued. Consider modified barium evaluation with speech therapy. Limited visualization of the esophagus shows a masslike lesion along the right lateral wall of the lower cervical esophagus. This favors and extrinsic lesion such as a right thyroid lobe nodule over an intrinsic esophageal mass. Recommend further evaluation with thyroid ultrasound. Electronically Signed   By: Danae Orleans M.D.   On: 10/01/2022 10:02     ASSESSMENT/PLAN:  This is a very pleasant 83 year old Caucasian male with stage IV squamous cell carcinoma of the skin with pulmonary metastases.  He was initially diagnosed in April 2022 with involvement of the skin around the left parotid gland status post left parotidectomy with skin resection followed by adjuvant radiation.  He then had evidence of disease progression with metastases to the lung in February 2023.   The patient is currently undergoing systemic treatment with immunotherapy with Libtayo 350 mg IV every 3 weeks.  He is status post 19 cycles.  The plan is to complete 2 years of therapy as long as he is tolerating well and there is no evidence of disease  progression or unacceptable toxicity.   Labs were reviewed.  Recommend that he proceed with cycle number 20 today scheduled.  We will see him back for follow-up visit in 3 weeks for evaluation repeat blood work before undergoing cycle #21.   I will order a repeat CT of the neck, chest, abdomen, and pelvis to assess for treatment response and to ensure no extrinsic compression on the esophagus as mentioned in recent swallow study.   He will continue to follow with dermatology. He will also continue to follow with ENT and speech therapy regarding his aspiration.  His potassium was slightly low.  He was instructed to increase his intake of potassium rich food.  The patient was advised to call immediately if he has any concerning symptoms in the interval. The patient voices understanding of current disease status and treatment options and is in agreement with the current care plan. All questions were answered. The patient knows to call the clinic with any problems, questions or concerns. We can certainly see the patient much sooner if necessary        Orders Placed This Encounter  Procedures   CT Soft Tissue Neck W Contrast    Standing Status:   Future    Standing Expiration Date:   10/03/2023    Order Specific Question:   If indicated for the ordered procedure, I authorize the administration of contrast media per Radiology protocol    Answer:   Yes    Order Specific Question:   Does the patient have a contrast media/X-ray dye allergy?    Answer:   No    Order Specific Question:   Preferred imaging location?    Answer:   [facility]   CT CHEST ABDOMEN PELVIS W CONTRAST    Standing Status:   Future    Standing Expiration Date:   10/03/2023    Order Specific Question:   If indicated for the ordered procedure, I authorize the administration of contrast media per Radiology protocol    Answer:   Yes    Order Specific Question:   Does the patient have a contrast media/X-ray dye  allergy?    Answer:   No    Order Specific Question:   Preferred imaging location?    Answer:   [facility]    Order Specific Question:   If indicated for the ordered procedure, I authorize the administration of oral contrast media per Radiology protocol    Answer:   Yes     The total time spent in the appointment was 20-29 minutes  Caraline Deutschman L Lucendia Leard, PA-C 10/03/22

## 2022-10-01 ENCOUNTER — Other Ambulatory Visit (HOSPITAL_COMMUNITY): Payer: Self-pay | Admitting: Otolaryngology

## 2022-10-01 ENCOUNTER — Ambulatory Visit (HOSPITAL_COMMUNITY)
Admission: RE | Admit: 2022-10-01 | Discharge: 2022-10-01 | Disposition: A | Discharge status: Home / Self Care | Payer: Medicare Other | Source: Ambulatory Visit | Attending: Otolaryngology | Admitting: Otolaryngology

## 2022-10-01 DIAGNOSIS — R1314 Dysphagia, pharyngoesophageal phase: Secondary | ICD-10-CM | POA: Diagnosis not present

## 2022-10-01 DIAGNOSIS — R1310 Dysphagia, unspecified: Secondary | ICD-10-CM | POA: Diagnosis not present

## 2022-10-03 ENCOUNTER — Ambulatory Visit: Payer: Medicare Other

## 2022-10-03 ENCOUNTER — Inpatient Hospital Stay: Payer: Medicare Other

## 2022-10-03 ENCOUNTER — Inpatient Hospital Stay: Payer: Medicare Other | Attending: Oncology | Admitting: Physician Assistant

## 2022-10-03 ENCOUNTER — Other Ambulatory Visit: Payer: Medicare Other

## 2022-10-03 VITALS — BP 120/67 | HR 69 | Temp 97.7°F | Resp 15 | Wt 130.2 lb

## 2022-10-03 VITALS — BP 145/58 | HR 50 | Resp 15

## 2022-10-03 DIAGNOSIS — C44329 Squamous cell carcinoma of skin of other parts of face: Secondary | ICD-10-CM | POA: Diagnosis not present

## 2022-10-03 DIAGNOSIS — Z5112 Encounter for antineoplastic immunotherapy: Secondary | ICD-10-CM | POA: Diagnosis not present

## 2022-10-03 DIAGNOSIS — Z79620 Long term (current) use of immunosuppressive biologic: Secondary | ICD-10-CM | POA: Diagnosis not present

## 2022-10-03 DIAGNOSIS — C7800 Secondary malignant neoplasm of unspecified lung: Secondary | ICD-10-CM | POA: Diagnosis not present

## 2022-10-03 DIAGNOSIS — C7989 Secondary malignant neoplasm of other specified sites: Secondary | ICD-10-CM

## 2022-10-03 LAB — CMP (CANCER CENTER ONLY)
ALT: 17 U/L (ref 0–44)
AST: 18 U/L (ref 15–41)
Albumin: 4.1 g/dL (ref 3.5–5.0)
Alkaline Phosphatase: 97 U/L (ref 38–126)
Anion gap: 8 (ref 5–15)
BUN: 15 mg/dL (ref 8–23)
CO2: 30 mmol/L (ref 22–32)
Calcium: 9.8 mg/dL (ref 8.9–10.3)
Chloride: 103 mmol/L (ref 98–111)
Creatinine: 1.02 mg/dL (ref 0.61–1.24)
GFR, Estimated: >60 mL/min (ref >60)
Glucose, Bld: 228 mg/dL — ABNORMAL HIGH (ref 70–99)
Potassium: 3.3 mmol/L — ABNORMAL LOW (ref 3.5–5.1)
Sodium: 141 mmol/L (ref 135–145)
Total Bilirubin: 0.7 mg/dL (ref 0.3–1.2)
Total Protein: 6.7 g/dL (ref 6.5–8.1)

## 2022-10-03 LAB — CBC WITH DIFFERENTIAL (CANCER CENTER ONLY)
Abs Immature Granulocytes: 0.01 10*3/uL (ref 0.00–0.07)
Basophils Absolute: 0 10*3/uL (ref 0.0–0.1)
Basophils Relative: 1 %
Eosinophils Absolute: 0.2 10*3/uL (ref 0.0–0.5)
Eosinophils Relative: 3 %
HCT: 40 % (ref 39.0–52.0)
Hemoglobin: 13.7 g/dL (ref 13.0–17.0)
Immature Granulocytes: 0 %
Lymphocytes Relative: 26 %
Lymphs Abs: 1.5 10*3/uL (ref 0.7–4.0)
MCH: 30.3 pg (ref 26.0–34.0)
MCHC: 34.3 g/dL (ref 30.0–36.0)
MCV: 88.5 fL (ref 80.0–100.0)
Monocytes Absolute: 0.4 10*3/uL (ref 0.1–1.0)
Monocytes Relative: 6 %
Neutro Abs: 3.7 10*3/uL (ref 1.7–7.7)
Neutrophils Relative %: 64 %
Platelet Count: 201 10*3/uL (ref 150–400)
RBC: 4.52 MIL/uL (ref 4.22–5.81)
RDW: 13.1 % (ref 11.5–15.5)
WBC Count: 5.8 10*3/uL (ref 4.0–10.5)
nRBC: 0 % (ref 0.0–0.2)

## 2022-10-03 LAB — TSH: TSH: 1.776 u[IU]/mL (ref 0.350–4.500)

## 2022-10-03 MED ORDER — SODIUM CHLORIDE 0.9 % IV SOLN
350.0000 mg | Freq: Once | INTRAVENOUS | Status: AC
  Administered 2022-10-03: 350 mg via INTRAVENOUS
  Filled 2022-10-03: qty 7

## 2022-10-03 MED ORDER — SODIUM CHLORIDE 0.9 % IV SOLN: Freq: Once | INTRAVENOUS | Status: AC

## 2022-10-03 NOTE — Progress Notes (Signed)
Patient seen by Cassie Heilingoetter, PA-C  Vitals are within treatment parameters.  Labs reviewed: and are within treatment parameters.  Per physician team, patient is ready for treatment and there are NO modifications to the treatment plan.  

## 2022-10-03 NOTE — Patient Instructions (Signed)
[facility]  Discharge Instructions: Thank you for choosing [facility] to provide your oncology and hematology care.   If you have a lab appointment with the Cancer Center, please go directly to the Cancer Center and check in at the registration area.   Wear comfortable clothing and clothing appropriate for easy access to any Portacath or PICC line.   We strive to give you quality time with your provider. You may need to reschedule your appointment if you arrive late (15 or more minutes).  Arriving late affects you and other patients whose appointments are after yours.  Also, if you miss three or more appointments without notifying the office, you may be dismissed from the clinic at the provider's discretion.      For prescription refill requests, have your pharmacy contact our office and allow 72 hours for refills to be completed.    Today you received the following chemotherapy and/or immunotherapy agents Libtayo.   To help prevent nausea and vomiting after your treatment, we encourage you to take your nausea medication as directed.  BELOW ARE SYMPTOMS THAT SHOULD BE REPORTED IMMEDIATELY: *FEVER GREATER THAN 100.4 F (38 C) OR HIGHER *CHILLS OR SWEATING *NAUSEA AND VOMITING THAT IS NOT CONTROLLED WITH YOUR NAUSEA MEDICATION *UNUSUAL SHORTNESS OF BREATH *UNUSUAL BRUISING OR BLEEDING *URINARY PROBLEMS (pain or burning when urinating, or frequent urination) *BOWEL PROBLEMS (unusual diarrhea, constipation, pain near the anus) TENDERNESS IN MOUTH AND THROAT WITH OR WITHOUT PRESENCE OF ULCERS (sore throat, sores in mouth, or a toothache) UNUSUAL RASH, SWELLING OR PAIN  UNUSUAL VAGINAL DISCHARGE OR ITCHING   Items with * indicate a potential emergency and should be followed up as soon as possible or go to the Emergency Department if any problems should occur.  Please show the CHEMOTHERAPY ALERT CARD or IMMUNOTHERAPY ALERT CARD at check-in  to the Emergency Department and triage nurse.  Should you have questions after your visit or need to cancel or reschedule your appointment, please contact [facility]  Dept: [phone]  and follow the prompts.  Office hours are 8:00 a.m. to 4:30 p.m. Monday - Friday. Please note that voicemails left after 4:00 p.m. may not be returned until the following business day.  We are closed weekends and major holidays. You have access to a nurse at all times for urgent questions. Please call the main number to the clinic Dept: [phone] and follow the prompts.   For any non-urgent questions, you may also contact your provider using MyChart. We now offer e-Visits for anyone 18 and older to request care online for non-urgent symptoms. For details visit [email or web address].   Also download the MyChart app! Go to the app store, search "MyChart", open the app, select , and log in with your MyChart username and password.   

## 2022-10-04 ENCOUNTER — Other Ambulatory Visit (HOSPITAL_COMMUNITY): Payer: Self-pay | Admitting: *Deleted

## 2022-10-04 DIAGNOSIS — R1310 Dysphagia, unspecified: Secondary | ICD-10-CM

## 2022-10-05 LAB — T4: T4, Total: 6.6 ug/dL (ref 4.5–12.0)

## 2022-10-08 ENCOUNTER — Telehealth: Payer: Self-pay | Admitting: Internal Medicine

## 2022-10-08 ENCOUNTER — Ambulatory Visit (HOSPITAL_COMMUNITY)
Admission: RE | Admit: 2022-10-08 | Discharge: 2022-10-08 | Disposition: A | Discharge status: Home / Self Care | Payer: Medicare Other | Source: Ambulatory Visit | Attending: Family Medicine | Admitting: Family Medicine

## 2022-10-08 ENCOUNTER — Ambulatory Visit (HOSPITAL_COMMUNITY)
Admission: RE | Admit: 2022-10-08 | Discharge: 2022-10-08 | Disposition: A | Discharge status: Home / Self Care | Payer: Medicare Other | Source: Ambulatory Visit

## 2022-10-08 DIAGNOSIS — R1314 Dysphagia, pharyngoesophageal phase: Secondary | ICD-10-CM | POA: Diagnosis not present

## 2022-10-08 DIAGNOSIS — Z923 Personal history of irradiation: Secondary | ICD-10-CM | POA: Diagnosis not present

## 2022-10-08 DIAGNOSIS — R1310 Dysphagia, unspecified: Secondary | ICD-10-CM

## 2022-10-08 DIAGNOSIS — Z85828 Personal history of other malignant neoplasm of skin: Secondary | ICD-10-CM | POA: Diagnosis not present

## 2022-10-08 DIAGNOSIS — Z9049 Acquired absence of other specified parts of digestive tract: Secondary | ICD-10-CM | POA: Diagnosis not present

## 2022-10-08 DIAGNOSIS — C7800 Secondary malignant neoplasm of unspecified lung: Secondary | ICD-10-CM | POA: Insufficient documentation

## 2022-10-08 NOTE — Telephone Encounter (Signed)
Called patient regarding upcoming May/June appointments, patient is notified.

## 2022-10-08 NOTE — Therapy (Signed)
Modified Barium Swallow Study  Patient Details  Name: Shaun Mcmillan MRN: [id number] Date of Birth: 31-Jan-1941  Today's Date: 10/08/2022  Modified Barium Swallow completed.  Full report located under Chart Review in the Imaging Section.  History of Present Illness Shaun Mcmillan is an 82 y.o. 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 Pollyann Kennedy and s/p radiation therapy under the care of Dr. Basilio Cairo completing 33 fractions on December 19, 2020. He then had evidence of disease progression with metastases to the lung in February 2023. He is on Libtayo (Cempilimab)  IV every 3 weeks starting 08/10/21. In addition, he has h/o DM-2, HLD, HTN, arthritis. He presented to appointment with Dr. Pollyann Kennedy. Patient had Esophagram on 10/01/22 which showed immediate aspiration of barium. (Patient reported to this SLP that he initially went to ENT to "get my ears cleaned out" and he did mention coughing with solid foods, prompting order of Esophagram). MBS recommended following aspiration seen on Esophagram.   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 Pharyngeal phase of swallow impacted by decreased tongue base retraction, partial epiglottic inversion, incomplete laryngeal vestibule closure. In addition, patient did not appear with adequate sensation to even moderate amount of pharyngeal residuals in vallecular and pyriform sinus as well as posterior pharyngeal wall, and no sensation to aspiration events. He did exhibit adequate laryngeal elevation and anterior hyoid excursion. When epiglottis moving to invert, the body of the epiglottis did demonstrate some mobility but tip did not. When head was in neutral position, patient had silent aspiration of trace amount of thin liquids occuring majority of the time. In addition, he had  moderate amount of pharyngeal residuals which would slowly clear with each additional dry swallow. A moderate amount of barium remained above PES after initial swallow and during transit through PES, bolus flow was limited. When patient with head turned to the right and chin tucked down, aspiration events were significantly reduced and bolus flow through pharynx and upper esophagus was improved. No significant difference between puree solids or mechanical soft solids in terms of amount of pharyngeal residuals, as both resulted in moderate amounts residuals in vallecular and pyriform sinus and posterior pharyngeal wall. SLP is recommending head turn right with chin tuck for all swallows of thin liquids, and multiple dry swallows for liquids and solids. SLP also recommending OP SLP for swallowing treatment. Patient asked if this was necessary, which is understandable as he himself has not had many c/o dysphagia. SLP suspects his dysphagia has been chronic for at least the past couple years. Factors that may increase risk of adverse event in presence of aspiration Rubye Oaks [employer] Clearance Coots 2021): Frail or deconditioned;Poor general health and/or compromised immunity  Swallow Evaluation Recommendations Recommendations: PO diet PO Diet Recommendation: Thin liquids (Level 0);Dysphagia 3 (Mechanical soft);Regular Liquid Administration via: Cup;Straw Medication Administration: Other (Comment) Supervision: Patient able to self-feed Swallowing strategies  : Head turn right during swallowing;Chin tuck Postural changes: Position pt fully upright for meals Oral care recommendations: Oral care BID (2x/day)    Angela Nevin, MA, CCC-SLP Speech Therapy

## 2022-10-18 ENCOUNTER — Ambulatory Visit (HOSPITAL_COMMUNITY)
Admission: RE | Admit: 2022-10-18 | Discharge: 2022-10-18 | Disposition: A | Discharge status: Home / Self Care | Payer: Medicare Other | Source: Ambulatory Visit | Attending: Physician Assistant | Admitting: Physician Assistant

## 2022-10-18 DIAGNOSIS — C7989 Secondary malignant neoplasm of other specified sites: Secondary | ICD-10-CM | POA: Diagnosis not present

## 2022-10-18 DIAGNOSIS — Z9889 Other specified postprocedural states: Secondary | ICD-10-CM | POA: Diagnosis not present

## 2022-10-18 DIAGNOSIS — C7800 Secondary malignant neoplasm of unspecified lung: Secondary | ICD-10-CM | POA: Diagnosis not present

## 2022-10-18 DIAGNOSIS — C07 Malignant neoplasm of parotid gland: Secondary | ICD-10-CM | POA: Diagnosis not present

## 2022-10-18 DIAGNOSIS — K5730 Diverticulosis of large intestine without perforation or abscess without bleeding: Secondary | ICD-10-CM | POA: Diagnosis not present

## 2022-10-18 MED ORDER — IOHEXOL 300 MG/ML  SOLN
100.0000 mL | Freq: Once | INTRAMUSCULAR | Status: AC | PRN
  Administered 2022-10-18: 100 mL via INTRAVENOUS

## 2022-10-22 ENCOUNTER — Other Ambulatory Visit: Payer: Self-pay

## 2022-10-22 DIAGNOSIS — C7989 Secondary malignant neoplasm of other specified sites: Secondary | ICD-10-CM

## 2022-10-23 ENCOUNTER — Encounter: Payer: Self-pay | Admitting: Internal Medicine

## 2022-10-23 ENCOUNTER — Encounter: Payer: Self-pay | Admitting: Medical Oncology

## 2022-10-23 ENCOUNTER — Inpatient Hospital Stay: Payer: Medicare Other

## 2022-10-23 ENCOUNTER — Other Ambulatory Visit: Payer: Self-pay

## 2022-10-23 ENCOUNTER — Inpatient Hospital Stay (HOSPITAL_BASED_OUTPATIENT_CLINIC_OR_DEPARTMENT_OTHER): Payer: Medicare Other | Admitting: Internal Medicine

## 2022-10-23 ENCOUNTER — Inpatient Hospital Stay: Payer: Medicare Other | Attending: Oncology

## 2022-10-23 VITALS — BP 136/68 | HR 92 | Temp 97.7°F | Resp 17 | Wt 134.2 lb

## 2022-10-23 DIAGNOSIS — C44329 Squamous cell carcinoma of skin of other parts of face: Secondary | ICD-10-CM | POA: Insufficient documentation

## 2022-10-23 DIAGNOSIS — C7989 Secondary malignant neoplasm of other specified sites: Secondary | ICD-10-CM

## 2022-10-23 DIAGNOSIS — E041 Nontoxic single thyroid nodule: Secondary | ICD-10-CM | POA: Insufficient documentation

## 2022-10-23 DIAGNOSIS — C7800 Secondary malignant neoplasm of unspecified lung: Secondary | ICD-10-CM | POA: Insufficient documentation

## 2022-10-23 DIAGNOSIS — Z5112 Encounter for antineoplastic immunotherapy: Secondary | ICD-10-CM | POA: Insufficient documentation

## 2022-10-23 LAB — COMPREHENSIVE METABOLIC PANEL
ALT: 16 U/L (ref 0–44)
AST: 17 U/L (ref 15–41)
Albumin: 4.4 g/dL (ref 3.5–5.0)
Alkaline Phosphatase: 115 U/L (ref 38–126)
Anion gap: 8 (ref 5–15)
BUN: 21 mg/dL (ref 8–23)
CO2: 28 mmol/L (ref 22–32)
Calcium: 9.6 mg/dL (ref 8.9–10.3)
Chloride: 104 mmol/L (ref 98–111)
Creatinine, Ser: 0.94 mg/dL (ref 0.61–1.24)
GFR, Estimated: >60 mL/min (ref >60)
Glucose, Bld: 90 mg/dL (ref 70–99)
Potassium: 4.5 mmol/L (ref 3.5–5.1)
Sodium: 140 mmol/L (ref 135–145)
Total Bilirubin: 0.6 mg/dL (ref 0.3–1.2)
Total Protein: 7.2 g/dL (ref 6.5–8.1)

## 2022-10-23 LAB — CBC WITH DIFFERENTIAL/PLATELET
Abs Immature Granulocytes: 0.02 10*3/uL (ref 0.00–0.07)
Basophils Absolute: 0 10*3/uL (ref 0.0–0.1)
Basophils Relative: 1 %
Eosinophils Absolute: 0.3 10*3/uL (ref 0.0–0.5)
Eosinophils Relative: 4 %
HCT: 40.4 % (ref 39.0–52.0)
Hemoglobin: 14 g/dL (ref 13.0–17.0)
Immature Granulocytes: 0 %
Lymphocytes Relative: 19 %
Lymphs Abs: 1.6 10*3/uL (ref 0.7–4.0)
MCH: 30.9 pg (ref 26.0–34.0)
MCHC: 34.7 g/dL (ref 30.0–36.0)
MCV: 89.2 fL (ref 80.0–100.0)
Monocytes Absolute: 0.7 10*3/uL (ref 0.1–1.0)
Monocytes Relative: 8 %
Neutro Abs: 5.8 10*3/uL (ref 1.7–7.7)
Neutrophils Relative %: 68 %
Platelets: 302 10*3/uL (ref 150–400)
RBC: 4.53 MIL/uL (ref 4.22–5.81)
RDW: 13 % (ref 11.5–15.5)
WBC: 8.4 10*3/uL (ref 4.0–10.5)
nRBC: 0 % (ref 0.0–0.2)

## 2022-10-23 LAB — TSH: TSH: 2.636 u[IU]/mL (ref 0.350–4.500)

## 2022-10-23 MED ORDER — SODIUM CHLORIDE 0.9 % IV SOLN: Freq: Once | INTRAVENOUS | Status: AC

## 2022-10-23 MED ORDER — SODIUM CHLORIDE 0.9 % IV SOLN
350.0000 mg | Freq: Once | INTRAVENOUS | Status: AC
  Administered 2022-10-23: 350 mg via INTRAVENOUS
  Filled 2022-10-23: qty 7

## 2022-10-23 NOTE — Patient Instructions (Signed)
[facility]  Discharge Instructions: Thank you for choosing [facility] to provide your oncology and hematology care.   If you have a lab appointment with the Cancer Center, please go directly to the Cancer Center and check in at the registration area.   Wear comfortable clothing and clothing appropriate for easy access to any Portacath or PICC line.   We strive to give you quality time with your provider. You may need to reschedule your appointment if you arrive late (15 or more minutes).  Arriving late affects you and other patients whose appointments are after yours.  Also, if you miss three or more appointments without notifying the office, you may be dismissed from the clinic at the provider's discretion.      For prescription refill requests, have your pharmacy contact our office and allow 72 hours for refills to be completed.    Today you received the following chemotherapy and/or immunotherapy agents: [id number]      To help prevent nausea and vomiting after your treatment, we encourage you to take your nausea medication as directed.  BELOW ARE SYMPTOMS THAT SHOULD BE REPORTED IMMEDIATELY: *FEVER GREATER THAN 100.4 F (38 C) OR HIGHER *CHILLS OR SWEATING *NAUSEA AND VOMITING THAT IS NOT CONTROLLED WITH YOUR NAUSEA MEDICATION *UNUSUAL SHORTNESS OF BREATH *UNUSUAL BRUISING OR BLEEDING *URINARY PROBLEMS (pain or burning when urinating, or frequent urination) *BOWEL PROBLEMS (unusual diarrhea, constipation, pain near the anus) TENDERNESS IN MOUTH AND THROAT WITH OR WITHOUT PRESENCE OF ULCERS (sore throat, sores in mouth, or a toothache) UNUSUAL RASH, SWELLING OR PAIN  UNUSUAL VAGINAL DISCHARGE OR ITCHING   Items with * indicate a potential emergency and should be followed up as soon as possible or go to the Emergency Department if any problems should occur.  Please show the CHEMOTHERAPY ALERT CARD or IMMUNOTHERAPY ALERT CARD at  check-in to the Emergency Department and triage nurse.  Should you have questions after your visit or need to cancel or reschedule your appointment, please contact [facility]  Dept: [phone]  and follow the prompts.  Office hours are 8:00 a.m. to 4:30 p.m. Monday - Friday. Please note that voicemails left after 4:00 p.m. may not be returned until the following business day.  We are closed weekends and major holidays. You have access to a nurse at all times for urgent questions. Please call the main number to the clinic Dept: [phone] and follow the prompts.   For any non-urgent questions, you may also contact your provider using MyChart. We now offer e-Visits for anyone 15 and older to request care online for non-urgent symptoms. For details visit [email or web address].   Also download the MyChart app! Go to the app store, search "MyChart", open the app, select [facility], and log in with your MyChart username and password.

## 2022-10-23 NOTE — Progress Notes (Signed)
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 He initially presented with skin cancer around the parotid gland in April 2022.   PRIOR THERAPY: 1) status post left parotidectomy with skin resection and rotational flap completed by Dr. Pollyann Kennedy in April 2022. Final pathology showed squamous cell carcinoma.  2) status post radiation therapy under the care of Dr. Basilio Cairo completing 33 fractions on December 19, 2020 for a total of 66 Gray   CURRENT THERAPY: Libtayo (Cempilimab) 350 Mg IV every 3 weeks started August 10, 2021.  Status post 20 cycles of treatment.  INTERVAL HISTORY: Shaun Mcmillan 82 y.o. male returns to the clinic today for follow-up visit accompanied by his wife.  The patient is feeling fine today with no concerning complaints.  Has some swallowing issues and he had swallow study by Dr. Pollyann Kennedy and was referred to a speech therapist.  He is doing much better he denied having any current chest pain, shortness of breath but has intermittent cough with no hemoptysis.  He has no nausea, vomiting, diarrhea or constipation.  He has no headache or visual changes.  He had repeat CT scan of the neck, chest, abdomen and pelvis performed recently and he is here for evaluation and discussion of his scan results.  MEDICAL HISTORY: Past Medical History:  Diagnosis Date   Arthritis    Diabetes mellitus type II    Glaucoma    sees Dr. 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  MEDICATIONS:  Current Outpatient Medications  Medication Sig Dispense Refill    acetaminophen (TYLENOL) 500 MG tablet Take 1,000 mg by mouth every 8 (eight) hours as needed for moderate pain.     amLODipine (NORVASC) 10 MG tablet TAKE 1 TABLET BY MOUTH DAILY 90 tablet 3   aspirin EC 81 MG tablet Take 1 tablet (81 mg total) by mouth daily. Swallow whole. 30 tablet 11   atorvastatin (LIPITOR) 40 MG tablet TAKE 1 TABLET BY MOUTH DAILY 90 tablet 3   Cholecalciferol (VITAMIN D PO) Take 5,000 Units by mouth daily.     gabapentin (NEURONTIN) 300 MG capsule TAKE 2 CAPSULES BY MOUTH 3 TIMES A DAY 180 capsule 1   latanoprost (XALATAN) 0.005 % ophthalmic solution Place 1 drop into both eyes every morning. [facility]  [address]     LORazepam (ATIVAN) 1 MG tablet Take 1 tablet (1 mg total) by mouth 3 (three) times daily as needed for anxiety. 60 tablet 5   losartan (COZAAR) 25 MG tablet Take 1 tablet (25 mg total) by mouth daily. 90 tablet 3   Melatonin 5 MG TABS Take 5 mg by mouth at bedtime. [facility] [address]     metFORMIN (GLUCOPHAGE) 500 MG tablet TAKE 1 TABLET BY MOUTH TWICE  DAILY WITH A MEAL 180 tablet 3   Multiple Vitamin (MULTIVITAMIN) tablet Take 1 tablet by mouth daily.     nystatin (MYCOSTATIN) 100000 UNIT/ML suspension Take 5 mLs (500,000 Units total) by mouth 4 (  four) times daily. 60 mL 0   prochlorperazine (COMPAZINE) 10 MG tablet Take 1 tablet (10 mg total) by mouth every 6 (six) hours as needed for nausea or vomiting. 30 tablet 0   timolol (BETIMOL) 0.5 % ophthalmic solution Place 1 drop into both eyes daily. [facility] [address]     No current facility-administered medications for this visit.    SURGICAL HISTORY:  Past Surgical History:  Procedure Laterality Date   APPENDECTOMY     colonscopy  03/12/2016   per Dr. Ewing Schlein, benign polyps, repeat in 5 yrs   left craniotomy  11/03/08   per Dr. Marikay Alar for a subdrual hematoma   PAROTIDECTOMY Left 09/20/2020   Procedure: PAROTIDECTOMY with resection of skin and rotational flap;  Surgeon: Serena Colonel, MD;  Location: [facility] OR;   Service: ENT;  Laterality: Left;   surgery for ocmpound fracture     right leg    REVIEW OF SYSTEMS:  Constitutional: positive for fatigue Eyes: negative Ears, nose, mouth, throat, and face: negative Respiratory: positive for cough Cardiovascular: negative Gastrointestinal: negative Genitourinary:negative Integument/breast: negative Hematologic/lymphatic: negative Musculoskeletal:negative Neurological: negative Behavioral/Psych: negative Endocrine: negative Allergic/Immunologic: negative   PHYSICAL EXAMINATION: General appearance: alert, cooperative, and no distress Head: Normocephalic, without obvious abnormality, atraumatic Neck: no adenopathy, no JVD, supple, symmetrical, trachea midline, and thyroid not enlarged, symmetric, no tenderness/mass/nodules Lymph nodes: Cervical, supraclavicular, and axillary nodes normal. Resp: clear to auscultation bilaterally Back: symmetric, no curvature. ROM normal. No CVA tenderness. Cardio: regular rate and rhythm, S1, S2 normal, no murmur, click, rub or gallop GI: soft, non-tender; bowel sounds normal; no masses,  no organomegaly Extremities: extremities normal, atraumatic, no cyanosis or edema Neurologic: Alert and oriented X 3, normal strength and tone. Normal symmetric reflexes. Normal coordination and gait  ECOG PERFORMANCE STATUS: 1 - Symptomatic but completely ambulatory  Blood pressure 136/68, pulse 92, temperature 97.7 F (36.5 C), temperature source Oral, resp. rate 17, weight 134 lb 3.2 oz (60.9 kg), SpO2 95 %.  LABORATORY DATA: Lab Results  Component Value Date   WBC 8.4 10/23/2022   HGB 14.0 10/23/2022   HCT 40.4 10/23/2022   MCV 89.2 10/23/2022   PLT 302 10/23/2022      Chemistry      Component Value Date/Time   NA 141 10/03/2022 0851   K 3.3 (L) 10/03/2022 0851   CL 103 10/03/2022 0851   CO2 30 10/03/2022 0851   BUN 15 10/03/2022 0851   CREATININE 1.02 10/03/2022 0851   CREATININE 0.69 (L) 01/08/2021 1553       Component Value Date/Time   CALCIUM 9.8 10/03/2022 0851   ALKPHOS 97 10/03/2022 0851   AST 18 10/03/2022 0851   ALT 17 10/03/2022 0851   BILITOT 0.7 10/03/2022 0851       RADIOGRAPHIC STUDIES: CT CHEST ABDOMEN PELVIS W CONTRAST  Result Date: 10/23/2022 CLINICAL DATA:  Parotid cancer with lung metastases EXAM: CT CHEST, ABDOMEN, AND PELVIS WITH CONTRAST TECHNIQUE: Multidetector CT imaging of the chest, abdomen and pelvis was performed following the standard protocol during bolus administration of intravenous contrast. RADIATION DOSE REDUCTION: This exam was performed according to the departmental dose-optimization program which includes automated exposure control, adjustment of the mA and/or kV according to patient size and/or use of iterative reconstruction technique. CONTRAST:  OMNIPAQUE IOHEXOL 300 MG/ML  SOLN COMPARISON:  06/19/2022 FINDINGS: CT CHEST FINDINGS Cardiovascular: The heart is normal in size. No pericardial effusion. No evidence of thoracic aortic aneurysm. Atherosclerotic calcifications of the aortic arch.  Severe three-vessel coronary atherosclerosis. Mediastinum/Nodes: No suspicious mediastinal lymphadenopathy. Visualized thyroid is unremarkable. Lungs/Pleura: Patchy left lower lobe opacity (series 4/image 107), favoring infection/pneumonia. Mild linear scarring/atelectasis in the right lower lobe. Improving pulmonary metastases with residual 5 mm lingular nodule (series 4/image 31), previously 6 mm. No pleural effusion or pneumothorax. Musculoskeletal: No focal osseous lesions. CT ABDOMEN PELVIS FINDINGS Hepatobiliary: Liver is within normal limits. Gallbladder is unremarkable. No intrahepatic or extrahepatic ductal dilatation. Pancreas: Within normal limits. Spleen: Within normal limits. Adrenals/Urinary Tract: Adrenal glands are within normal limits. Left kidney is within normal limits. Right renal cysts, measuring up to 5.7 cm in the right upper kidney (series 2/image 7),  measuring simple fluid density, benign. No hydronephrosis. Bladder is thick-walled although underdistended. Stomach/Bowel: Stomach is within normal limits. No evidence of bowel obstruction. Appendix is not discretely visualized. Mild sigmoid diverticulosis, without evidence of diverticulitis. Vascular/Lymphatic: No evidence of abdominal aortic aneurysm. Atherosclerotic calcifications of the abdominal aorta and branch vessels. No suspicious abdominopelvic lymphadenopathy. Reproductive: Prostate is grossly unremarkable. Other: No abdominopelvic ascites. Musculoskeletal: Mild degenerative changes of the lumbar spine. IMPRESSION: Improving pulmonary metastases with residual 5 mm lingular nodule, previously 6 mm. No evidence of metastatic disease in the abdomen/pelvis. New patchy left lower lobe opacity, suspicious for pneumonia. Electronically Signed   By: Charline Bills M.D.   On: 10/23/2022 01:36   DG SWALLOW FUNC OP MEDICARE SPEECH PATH  Result Date: 10/08/2022 Table formatting from the original result was not included. Modified Barium Swallow Study Patient Details Name: Kennard Johannsen MRN: [id number] Date of Birth: 1941/04/21 Today's Date: 10/08/2022 HPI/PMH: HPI: Shaun Mcmillan is an 82 y.o. 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 Pollyann Kennedy and s/p radiation therapy under the care of Dr. Basilio Cairo completing 33 fractions on December 19, 2020. He then had evidence of disease progression with metastases to the lung in February 2023. He is on Libtayo (Cempilimab) 350mg  IV every 3 weeks starting 08/10/21. In addition, he has h/o DM-2, HLD, HTN, arthritis. He presented to appointment with Dr. Pollyann Kennedy. Patient had Esophagram on 10/01/22 which showed immediate aspiration of barium. (Patient reported to this SLP that he initially went to ENT to "get my ears cleaned out" and he did mention coughing with solid  foods, prompting order of Esophagram). MBS recommended following aspiration seen on Esophagram. Clinical Impression: Clinical Impression: Patient presents with a moderately impaired pharyngeal dysphagia with suspected esophageal component. Pharyngeal phase of swallow impacted by decreased tongue base retraction, partial epiglottic inversion, incomplete laryngeal vestibule closure. In addition, patient did not appear with adequate sensation to even moderate amount of pharyngeal residuals in vallecular and pyriform sinus as well as posterior pharyngeal wall, and no sensation to aspiration events. He did exhibit adequate laryngeal elevation and anterior hyoid excursion. When epiglottis moving to invert, the body of the epiglottis did demonstrate some mobility but tip did not. When head was in neutral position, patient had silent aspiration of trace amount of thin liquids occuring majority of the time. In addition, he had moderate amount of pharyngeal residuals which would slowly clear with each additional dry swallow. A moderate amount of barium remained above PES after initial swallow and during transit through PES, bolus flow was limited. When patient with head turned to the right and chin tucked down, aspiration events were significantly reduced and bolus flow through pharynx and upper esophagus was improved. No significant difference between puree  solids or mechanical soft solids in terms of amount of pharyngeal residuals, as both resulted in moderate amounts residuals in vallecular and pyriform sinus and posterior pharyngeal wall. SLP is recommending head turn right with chin tuck for all swallows of thin liquids, and multiple dry swallows for liquids and solids. SLP also recommending OP SLP for swallowing treatment. Patient asked if this was necessary, which is understandable as he himself has not had many c/o dysphagia. SLP suspects his dysphagia has been chronic for at least the past couple years. Factors that  may increase risk of adverse event in presence of aspiration Rubye Oaks & Clearance Coots 2021): Factors that may increase risk of adverse event in presence of aspiration Rubye Oaks & Clearance Coots 2021): Frail or deconditioned; Poor general health and/or compromised immunity Recommendations/Plan: Swallowing Evaluation Recommendations Swallowing Evaluation Recommendations Recommendations: PO diet PO Diet Recommendation: Thin liquids (Level 0); Dysphagia 3 (Mechanical soft); Regular Liquid Administration via: Cup; Straw Medication Administration: Other (Comment) Supervision: Patient able to self-feed Swallowing strategies  : Head turn right during swallowing; Chin tuck Postural changes: Position pt fully upright for meals Oral care recommendations: Oral care BID (2x/day) Treatment Plan Treatment Plan Follow-up recommendations: Outpatient SLP Functional status assessment: Patient has had a recent decline in their functional status and demonstrates the ability to make significant improvements in function in a reasonable and predictable amount of time. Recommendations Recommendations for follow up therapy are one component of a multi-disciplinary discharge planning process, led by the attending physician.  Recommendations may be updated based on patient status, additional functional criteria and insurance authorization. Assessment: Orofacial Exam: Orofacial Exam Oral Cavity: Oral Hygiene: Xerostomia Oral Cavity - Dentition: Adequate natural dentition Orofacial Anatomy: WFL Anatomy: Anatomy: WFL Boluses Administered: Boluses Administered Boluses Administered: Thin liquids (Level 0); Mildly thick liquids (Level 2, nectar thick); Puree; Solid  Oral Impairment Domain: Oral Impairment Domain Lip Closure: No labial escape Tongue control during bolus hold: Cohesive bolus between tongue to palatal seal Bolus preparation/mastication: Timely and efficient chewing and mashing Bolus transport/lingual motion: Brisk tongue motion Oral residue: Complete  oral clearance Location of oral residue : N/A Initiation of pharyngeal swallow : Valleculae  Pharyngeal Impairment Domain: Pharyngeal Impairment Domain Soft palate elevation: No bolus between soft palate (SP)/pharyngeal wall (PW) Laryngeal elevation: Complete superior movement of thyroid cartilage with complete approximation of arytenoids to epiglottic petiole Anterior hyoid excursion: Complete anterior movement Epiglottic movement: Partial inversion Laryngeal vestibule closure: Incomplete, narrow column air/contrast in laryngeal vestibule Pharyngeal stripping wave : Present - complete Pharyngeal contraction (A/P view only): Complete Pharyngoesophageal segment opening: Partial distention/partial duration, partial obstruction of flow Tongue base retraction: Trace column of contrast or air between tongue base and PPW Pharyngeal residue: Collection of residue within or on pharyngeal structures Location of pharyngeal residue: Tongue base; Valleculae; Pharyngeal wall; Pyriform sinuses  Esophageal Impairment Domain: Esophageal Impairment Domain Esophageal clearance upright position: Complete clearance, esophageal coating Pill: Esophageal Impairment Domain Esophageal clearance upright position: Complete clearance, esophageal coating Penetration/Aspiration Scale Score: Penetration/Aspiration Scale Score 1.  Material does not enter airway: Mildly thick liquids (Level 2, nectar thick); Puree; Solid 2.  Material enters airway, remains ABOVE vocal cords then ejected out: Mildly thick liquids (Level 2, nectar thick) 8.  Material enters airway, passes BELOW cords without attempt by patient to eject out (silent aspiration) : Thin liquids (Level 0) Compensatory Strategies: No data recorded  General Information: Caregiver present: No  Diet Prior to this Study: Regular; Thin liquids (Level 0)   Temperature : Normal   Respiratory Status:  WFL   Supplemental O2: None (Room air)   History of Recent Intubation: No  Behavior/Cognition:  Alert; Cooperative; Pleasant mood Self-Feeding Abilities: Able to self-feed Baseline vocal quality/speech: Normal Volitional Cough: Able to elicit Volitional Swallow: Able to elicit Exam Limitations: No limitations Goal Planning: No data recorded No data recorded No data recorded No data recorded Consulted and agree with results and recommendations: Patient Pain: No data recorded End of Session: Start Time:No data recorded Stop Time: No data recorded Time Calculation:No data recorded Charges: No data recorded SLP visit diagnosis: SLP Visit Diagnosis: Dysphagia, pharyngeal phase (R13.13); Dysphagia, pharyngoesophageal phase (R13.14) Past Medical History: Past Medical History: Diagnosis Date  Arthritis   Diabetes mellitus type II   Glaucoma   sees Dr. 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 Ewing Schlein, benign polyps, repeat in 5 yrs  left craniotomy  11/03/08  per Dr. Marikay Alar for a subdrual hematoma  PAROTIDECTOMY Left 09/20/2020  Procedure: PAROTIDECTOMY with resection of skin and rotational flap;  Surgeon: Serena Colonel, MD;  Location: [facility] OR;  Service: ENT;  Laterality: Left;  surgery for ocmpound fracture    right leg Angela Nevin, MA, CCC-SLP Speech Therapy CLINICAL DATA:  Dysphagia. Coughing while eating and drinking. Aspiration noted on recent esophagram. EXAM: MODIFIED BARIUM SWALLOW TECHNIQUE: Different consistencies of barium were administered orally to the patient by the Speech Pathologist. Imaging of the pharynx was performed in the lateral projection. Brayton El PA-C was present in the fluoroscopy room during this study, which was supervised and interpreted by Dr. Loralie Champagne. FLUOROSCOPY: Radiation Exposure Index  (as provided by the fluoroscopic device): 49 mGy Kerma COMPARISON:  None Available. FINDINGS: Vestibular Penetration: This was seen with multiple consistencies including thin and nectar. Aspiration: This is seen intermittently with mostly thin consistency but also nectar thick. Other:  None. IMPRESSION: Both vestibular penetration as well as aspiration seen during this study. Please refer to the Speech Pathologists report for complete details and recommendations. Electronically Signed   By: Rudie Meyer M.D.   On: 10/08/2022 12:25  DG ESOPHAGUS W SINGLE CM (SOL OR THIN BA)  Result Date: 10/01/2022 CLINICAL DATA:  Patient with a history of left neck squamous cell carcinoma status post surgical resection and radiation therapy. Patient complains of dysphagia with solid foods, and coughing with some meals. EXAM: ESOPHAGUS/BARIUM SWALLOW TECHNIQUE: Single contrast examination was performed using thin liquid barium. This exam was performed by Alwyn Ren, NP, and was supervised and interpreted by Dr. Myles Rosenthal. FLUOROSCOPY: Radiation Exposure Index (as provided by the fluoroscopic device): 2.60 mGy Kerma COMPARISON:  None Available. FINDINGS: Swallowing: Aspiration occurred immediately, with aspirated barium reaching the right mainstem bronchus. Pharynx: Unremarkable. Esophagus: Limited evaluation exam had to be discontinued due to aspiration. Cine loop during swallowing shows a masslike lesion along the right lateral wall of the lower cervical esophagus, which favors an extrinsic lesion over an intrinsic esophageal mass. This may be due to external mass effect by a right thyroid lobe nodule, with intrinsic esophageal mass considered less likely. No evidence of esophageal obstruction, or other definite anatomic abnormality on obtained cine loop. Esophageal motility: Within normal limits. Hiatal Hernia: None seen on this  limited exam. IMPRESSION: Moderate aspiration occurred immediately, and exam was therefore  discontinued. Consider modified barium evaluation with speech therapy. Limited visualization of the esophagus shows a masslike lesion along the right lateral wall of the lower cervical esophagus. This favors and extrinsic lesion such as a right thyroid lobe nodule over an intrinsic esophageal mass. Recommend further evaluation with thyroid ultrasound. Electronically Signed   By: Danae Orleans M.D.   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  He had evidence for disease progression and metastasis to the lung in February 2023. The patient is currently undergoing treatment with immunotherapy with Libtayo (Cempilimab) 350 Mg IV every 3 weeks status post 20 cycles.  The patient has been tolerating her treatment well with no concerning adverse effects. He had repeat CT scan of the neck, chest, abdomen and pelvis performed recently.  I personally and independently reviewed the scan images and discussed the result with the patient.  The scan of the chest, abdomen and pelvis showed improvement of his disease with no concerning findings for progression.  CT scan of the neck is still pending. I recommended for the patient to continue his current treatment with Libtayo (Cempilimab) and he will proceed with cycle #21 today. He will come back for follow-up visit in 3 weeks for evaluation before the next cycle of his treatment. He was advised to call immediately if he has any other concerning symptoms in the interval. The patient voices understanding of current disease status and treatment options and is in agreement with the current care plan.  All questions were answered. The patient knows to call the clinic with any  problems, questions or concerns. We can certainly see the patient much sooner if necessary.  The total time spent in the appointment was 30 minutes.  Disclaimer: This note was dictated with voice recognition software. Similar sounding words can inadvertently be transcribed and may not be corrected upon review.

## 2022-10-23 NOTE — Progress Notes (Signed)
Patient seen by Dr. Mohamed  Vitals are within treatment parameters.  Labs reviewed: and are within treatment parameters.  Per physician team, patient is ready for treatment and there are NO modifications to the treatment plan.  

## 2022-10-24 ENCOUNTER — Ambulatory Visit: Payer: Medicare Other

## 2022-10-24 ENCOUNTER — Other Ambulatory Visit: Payer: Medicare Other

## 2022-10-24 ENCOUNTER — Ambulatory Visit: Payer: Medicare Other | Admitting: Internal Medicine

## 2022-10-25 LAB — T4: T4, Total: 7.2 ug/dL (ref 4.5–12.0)

## 2022-10-30 ENCOUNTER — Other Ambulatory Visit: Payer: Self-pay

## 2022-11-01 ENCOUNTER — Other Ambulatory Visit: Payer: Self-pay

## 2022-11-14 ENCOUNTER — Inpatient Hospital Stay: Payer: Medicare Other

## 2022-11-14 ENCOUNTER — Inpatient Hospital Stay (HOSPITAL_BASED_OUTPATIENT_CLINIC_OR_DEPARTMENT_OTHER): Payer: Medicare Other | Admitting: Internal Medicine

## 2022-11-14 ENCOUNTER — Encounter: Payer: Self-pay | Admitting: Medical Oncology

## 2022-11-14 VITALS — BP 143/71 | HR 61 | Temp 97.4°F | Resp 17 | Ht 70.0 in | Wt 132.8 lb

## 2022-11-14 VITALS — BP 153/73 | HR 63 | Temp 98.1°F | Resp 16

## 2022-11-14 DIAGNOSIS — C7800 Secondary malignant neoplasm of unspecified lung: Secondary | ICD-10-CM | POA: Diagnosis not present

## 2022-11-14 DIAGNOSIS — Z5112 Encounter for antineoplastic immunotherapy: Secondary | ICD-10-CM | POA: Diagnosis not present

## 2022-11-14 DIAGNOSIS — C7989 Secondary malignant neoplasm of other specified sites: Secondary | ICD-10-CM

## 2022-11-14 DIAGNOSIS — C44329 Squamous cell carcinoma of skin of other parts of face: Secondary | ICD-10-CM | POA: Diagnosis not present

## 2022-11-14 DIAGNOSIS — E041 Nontoxic single thyroid nodule: Secondary | ICD-10-CM | POA: Diagnosis not present

## 2022-11-14 LAB — CMP (CANCER CENTER ONLY)
ALT: 18 U/L (ref 0–44)
AST: 20 U/L (ref 15–41)
Albumin: 4.1 g/dL (ref 3.5–5.0)
Alkaline Phosphatase: 104 U/L (ref 38–126)
Anion gap: 6 (ref 5–15)
BUN: 16 mg/dL (ref 8–23)
CO2: 28 mmol/L (ref 22–32)
Calcium: 9 mg/dL (ref 8.9–10.3)
Chloride: 106 mmol/L (ref 98–111)
Creatinine: 0.82 mg/dL (ref 0.61–1.24)
GFR, Estimated: >60 mL/min (ref >60)
Glucose, Bld: 156 mg/dL — ABNORMAL HIGH (ref 70–99)
Potassium: 3.7 mmol/L (ref 3.5–5.1)
Sodium: 140 mmol/L (ref 135–145)
Total Bilirubin: 0.5 mg/dL (ref 0.3–1.2)
Total Protein: 6.7 g/dL (ref 6.5–8.1)

## 2022-11-14 LAB — CBC WITH DIFFERENTIAL (CANCER CENTER ONLY)
Abs Immature Granulocytes: 0.01 10*3/uL (ref 0.00–0.07)
Basophils Absolute: 0 10*3/uL (ref 0.0–0.1)
Basophils Relative: 0 %
Eosinophils Absolute: 0.1 10*3/uL (ref 0.0–0.5)
Eosinophils Relative: 3 %
HCT: 37.3 % — ABNORMAL LOW (ref 39.0–52.0)
Hemoglobin: 12.8 g/dL — ABNORMAL LOW (ref 13.0–17.0)
Immature Granulocytes: 0 %
Lymphocytes Relative: 13 %
Lymphs Abs: 0.7 10*3/uL (ref 0.7–4.0)
MCH: 30.2 pg (ref 26.0–34.0)
MCHC: 34.3 g/dL (ref 30.0–36.0)
MCV: 88 fL (ref 80.0–100.0)
Monocytes Absolute: 0.4 10*3/uL (ref 0.1–1.0)
Monocytes Relative: 7 %
Neutro Abs: 3.8 10*3/uL (ref 1.7–7.7)
Neutrophils Relative %: 77 %
Platelet Count: 187 10*3/uL (ref 150–400)
RBC: 4.24 MIL/uL (ref 4.22–5.81)
RDW: 13.2 % (ref 11.5–15.5)
WBC Count: 5 10*3/uL (ref 4.0–10.5)
nRBC: 0 % (ref 0.0–0.2)

## 2022-11-14 LAB — TSH: TSH: 1.405 u[IU]/mL (ref 0.350–4.500)

## 2022-11-14 MED ORDER — SODIUM CHLORIDE 0.9 % IV SOLN
350.0000 mg | Freq: Once | INTRAVENOUS | Status: AC
  Administered 2022-11-14: 350 mg via INTRAVENOUS
  Filled 2022-11-14: qty 7

## 2022-11-14 MED ORDER — SODIUM CHLORIDE 0.9 % IV SOLN: Freq: Once | INTRAVENOUS | Status: AC

## 2022-11-14 NOTE — Progress Notes (Signed)
[facility] [phone]   [phone]) [phone]  OFFICE PROGRESS NOTE  [name], MD [address] 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 He initially presented with skin cancer around the parotid gland in April 2022.   PRIOR THERAPY: 1) status post left parotidectomy with skin resection and rotational flap completed by Dr. Pollyann Kennedy in April 2022. Final pathology showed squamous cell carcinoma.  2) status post radiation therapy under the care of Dr. Basilio Cairo completing 33 fractions on December 19, 2020 for a total of 66 Gray   CURRENT THERAPY: Libtayo (Cempilimab) 350 Mg IV every 3 weeks started August 10, 2021.  Status post 21 cycles of treatment.  INTERVAL HISTORY: Shaun Mcmillan 82 y.o. male returns to the clinic today for follow-up visit accompanied by his wife Shaun Mcmillan.  The patient is feeling fine today with no concerning complaints.  He denied having any current chest pain, shortness of breath, cough or hemoptysis.  He has no nausea, vomiting, diarrhea or constipation.  He has no headache or visual changes.  He continues to tolerate his treatment with Libtayo (Cempilimab) fairly well.  He is here today for evaluation before starting cycle #22 of his treatment.  MEDICAL HISTORY: Past Medical History:  Diagnosis Date   Arthritis    Diabetes mellitus type II    Glaucoma    sees Dr. 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  MEDICATIONS:  Current Outpatient Medications  Medication Sig Dispense Refill   acetaminophen (TYLENOL) 500 MG tablet Take 1,000 mg by mouth every 8 (eight) hours as needed for moderate pain. (Patient not taking:  Reported on 10/23/2022)     amLODipine (NORVASC) 10 MG tablet TAKE 1 TABLET BY MOUTH DAILY 90 tablet 3   aspirin EC 81 MG tablet Take 1 tablet (81 mg total) by mouth daily. Swallow whole. 30 tablet 11   atorvastatin (LIPITOR) 40 MG tablet TAKE 1 TABLET BY MOUTH DAILY 90 tablet 3   Cholecalciferol (VITAMIN D PO) Take 5,000 Units by mouth daily.     gabapentin (NEURONTIN) 300 MG capsule TAKE 2 CAPSULES BY MOUTH 3 TIMES A DAY 180 capsule 1   latanoprost (XALATAN) 0.005 % ophthalmic solution Place 1 drop into both eyes every morning. [facility]  [address]     LORazepam (ATIVAN) 1 MG tablet Take 1 tablet (1 mg total) by mouth 3 (three) times daily as needed for anxiety. 60 tablet 5   losartan (COZAAR) 25 MG tablet Take 1 tablet (25 mg total) by mouth daily. 90 tablet 3   Melatonin 5 MG TABS Take 5 mg by mouth at bedtime. [facility] [address]     metFORMIN (GLUCOPHAGE) 500 MG tablet TAKE 1 TABLET BY MOUTH TWICE  DAILY WITH A MEAL 180 tablet 3   Multiple Vitamin (MULTIVITAMIN) tablet Take 1 tablet by mouth daily.     nystatin (MYCOSTATIN) 100000 UNIT/ML suspension Take 5 mLs (500,000 Units total) by mouth 4 (four) times daily. 60 mL 0   prochlorperazine (COMPAZINE) 10 MG tablet Take 1 tablet (10 mg total) by mouth every 6 (  six) hours as needed for nausea or vomiting. (Patient not taking: Reported on 10/23/2022) 30 tablet 0   timolol (BETIMOL) 0.5 % ophthalmic solution Place 1 drop into both eyes daily. [facility] [address]     No current facility-administered medications for this visit.    SURGICAL HISTORY:  Past Surgical History:  Procedure Laterality Date   APPENDECTOMY     colonscopy  03/12/2016   per Dr. Ewing Schlein, benign polyps, repeat in 5 yrs   left craniotomy  11/03/08   per Dr. Marikay Alar for a subdrual hematoma   PAROTIDECTOMY Left 09/20/2020   Procedure: PAROTIDECTOMY with resection of skin and rotational flap;  Surgeon: Serena Colonel, MD;  Location: [facility] OR;  Service: ENT;  Laterality: Left;   surgery for  ocmpound fracture     right leg    REVIEW OF SYSTEMS:  A comprehensive review of systems was negative.   PHYSICAL EXAMINATION: General appearance: alert, cooperative, and no distress Head: Normocephalic, without obvious abnormality, atraumatic Neck: no adenopathy, no JVD, supple, symmetrical, trachea midline, and thyroid not enlarged, symmetric, no tenderness/mass/nodules Lymph nodes: Cervical, supraclavicular, and axillary nodes normal. Resp: clear to auscultation bilaterally Back: symmetric, no curvature. ROM normal. No CVA tenderness. Cardio: regular rate and rhythm, S1, S2 normal, no murmur, click, rub or gallop GI: soft, non-tender; bowel sounds normal; no masses,  no organomegaly Extremities: extremities normal, atraumatic, no cyanosis or edema  ECOG PERFORMANCE STATUS: 1 - Symptomatic but completely ambulatory  Blood pressure (!) 143/71, pulse 61, temperature (!) 97.4 F (36.3 C), temperature source Oral, resp. rate 17, height 5\' 10"  (1.778 m), weight 132 lb 12.8 oz (60.2 kg), SpO2 100 %.  LABORATORY DATA: Lab Results  Component Value Date   WBC 5.0 11/14/2022   HGB 12.8 (L) 11/14/2022   HCT 37.3 (L) 11/14/2022   MCV 88.0 11/14/2022   PLT 187 11/14/2022      Chemistry      Component Value Date/Time   NA 140 10/23/2022 1247   K 4.5 10/23/2022 1247   CL 104 10/23/2022 1247   CO2 28 10/23/2022 1247   BUN 21 10/23/2022 1247   CREATININE 0.94 10/23/2022 1247   CREATININE 1.02 10/03/2022 0851   CREATININE 0.69 (L) 01/08/2021 1553      Component Value Date/Time   CALCIUM 9.6 10/23/2022 1247   ALKPHOS 115 10/23/2022 1247   AST 17 10/23/2022 1247   AST 18 10/03/2022 0851   ALT 16 10/23/2022 1247   ALT 17 10/03/2022 0851   BILITOT 0.6 10/23/2022 1247   BILITOT 0.7 10/03/2022 0851       RADIOGRAPHIC STUDIES: CT Soft Tissue Neck W Contrast  Result Date: 10/24/2022 CLINICAL DATA:  History of parotid cancer with pulmonary metastasis. Recent abnormal esophagram EXAM:  CT NECK WITH CONTRAST TECHNIQUE: Multidetector CT imaging of the neck was performed using the standard protocol following the bolus administration of intravenous contrast. RADIATION DOSE REDUCTION: This exam was performed according to the departmental dose-optimization program which includes automated exposure control, adjustment of the mA and/or kV according to patient size and/or use of iterative reconstruction technique. CONTRAST:  OMNIPAQUE IOHEXOL 300 MG/ML  SOLN COMPARISON:  06/19/2022 FINDINGS: Pharynx and larynx: Patulous airway. No evidence of mass or inflammation. No visible cause of the esophagram findings. Salivary glands: Resected left parotid gland. No masslike changes in the resection bed. The other salivary glands are atrophic. Thyroid: No significant finding Lymph nodes: None enlarged or heterogeneous. Vascular: Extensive atheromatous calcification. No acute vascular finding  Limited intracranial: Negative Visualized orbits: Negative Mastoids and visualized paranasal sinuses: Clear Skeleton: Extensive stylohyoid ligament ossification. Ordinary cervical spine degeneration. No acute or aggressive finding. Upper chest: Clear apical lungs. IMPRESSION: Stable postoperative neck. No evidence of recurrence or specific cause for the esophagram findings. Electronically Signed   By: Tiburcio Pea M.D.   On: 10/24/2022 07:47   CT CHEST ABDOMEN PELVIS W CONTRAST  Result Date: 10/23/2022 CLINICAL DATA:  Parotid cancer with lung metastases EXAM: CT CHEST, ABDOMEN, AND PELVIS WITH CONTRAST TECHNIQUE: Multidetector CT imaging of the chest, abdomen and pelvis was performed following the standard protocol during bolus administration of intravenous contrast. RADIATION DOSE REDUCTION: This exam was performed according to the departmental dose-optimization program which includes automated exposure control, adjustment of the mA and/or kV according to patient size and/or use of iterative reconstruction  technique. CONTRAST:  OMNIPAQUE IOHEXOL 300 MG/ML  SOLN COMPARISON:  06/19/2022 FINDINGS: CT CHEST FINDINGS Cardiovascular: The heart is normal in size. No pericardial effusion. No evidence of thoracic aortic aneurysm. Atherosclerotic calcifications of the aortic arch. Severe three-vessel coronary atherosclerosis. Mediastinum/Nodes: No suspicious mediastinal lymphadenopathy. Visualized thyroid is unremarkable. Lungs/Pleura: Patchy left lower lobe opacity (series 4/image 107), favoring infection/pneumonia. Mild linear scarring/atelectasis in the right lower lobe. Improving pulmonary metastases with residual 5 mm lingular nodule (series 4/image 31), previously 6 mm. No pleural effusion or pneumothorax. Musculoskeletal: No focal osseous lesions. CT ABDOMEN PELVIS FINDINGS Hepatobiliary: Liver is within normal limits. Gallbladder is unremarkable. No intrahepatic or extrahepatic ductal dilatation. Pancreas: Within normal limits. Spleen: Within normal limits. Adrenals/Urinary Tract: Adrenal glands are within normal limits. Left kidney is within normal limits. Right renal cysts, measuring up to 5.7 cm in the right upper kidney (series 2/image 7), measuring simple fluid density, benign. No hydronephrosis. Bladder is thick-walled although underdistended. Stomach/Bowel: Stomach is within normal limits. No evidence of bowel obstruction. Appendix is not discretely visualized. Mild sigmoid diverticulosis, without evidence of diverticulitis. Vascular/Lymphatic: No evidence of abdominal aortic aneurysm. Atherosclerotic calcifications of the abdominal aorta and branch vessels. No suspicious abdominopelvic lymphadenopathy. Reproductive: Prostate is grossly unremarkable. Other: No abdominopelvic ascites. Musculoskeletal: Mild degenerative changes of the lumbar spine. IMPRESSION: Improving pulmonary metastases with residual 5 mm lingular nodule, previously 6 mm. No evidence of metastatic disease in the abdomen/pelvis. New patchy  left lower lobe opacity, suspicious for pneumonia. Electronically Signed   By: Charline Bills M.D.   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  He had evidence for disease progression and metastasis to the lung in February 2023. The patient is currently undergoing treatment with immunotherapy with Libtayo (Cempilimab) 350 Mg IV every 3 weeks status post 21 cycles.   The patient has been tolerating this treatment well with no concerning adverse effects. I recommended for him to proceed with cycle #22 today. I will see him back for follow-up visit in 3 weeks for evaluation the next cycle of his treatment. The patient was advised to call immediately if he has any concerning symptoms in the interval. The patient voices understanding of current disease status and treatment options and is in agreement with the current care plan.  All questions were answered. The patient knows to call the clinic with any problems, questions  or concerns. We can certainly see the patient much sooner if necessary.  The total time spent in the appointment was 20 minutes.  Disclaimer: This note was dictated with voice recognition software. Similar sounding words can inadvertently be transcribed and may not be corrected upon review.

## 2022-11-14 NOTE — Progress Notes (Signed)
Patient seen by Dr. Mohamed  Vitals are within treatment parameters.  Labs reviewed: and are within treatment parameters.  Per physician team, patient is ready for treatment and there are NO modifications to the treatment plan.  

## 2022-11-14 NOTE — Patient Instructions (Signed)
[facility]  Discharge Instructions: Thank you for choosing [facility] to provide your oncology and hematology care.   If you have a lab appointment with the Cancer Center, please go directly to the Cancer Center and check in at the registration area.   Wear comfortable clothing and clothing appropriate for easy access to any Portacath or PICC line.   We strive to give you quality time with your provider. You may need to reschedule your appointment if you arrive late (15 or more minutes).  Arriving late affects you and other patients whose appointments are after yours.  Also, if you miss three or more appointments without notifying the office, you may be dismissed from the clinic at the provider's discretion.      For prescription refill requests, have your pharmacy contact our office and allow 72 hours for refills to be completed.    Today you received the following chemotherapy and/or immunotherapy agent: Cemiplimab (Libtayo)   To help prevent nausea and vomiting after your treatment, we encourage you to take your nausea medication as directed.  BELOW ARE SYMPTOMS THAT SHOULD BE REPORTED IMMEDIATELY: *FEVER GREATER THAN 100.4 F (38 C) OR HIGHER *CHILLS OR SWEATING *NAUSEA AND VOMITING THAT IS NOT CONTROLLED WITH YOUR NAUSEA MEDICATION *UNUSUAL SHORTNESS OF BREATH *UNUSUAL BRUISING OR BLEEDING *URINARY PROBLEMS (pain or burning when urinating, or frequent urination) *BOWEL PROBLEMS (unusual diarrhea, constipation, pain near the anus) TENDERNESS IN MOUTH AND THROAT WITH OR WITHOUT PRESENCE OF ULCERS (sore throat, sores in mouth, or a toothache) UNUSUAL RASH, SWELLING OR PAIN  UNUSUAL VAGINAL DISCHARGE OR ITCHING   Items with * indicate a potential emergency and should be followed up as soon as possible or go to the Emergency Department if any problems should occur.  Please show the CHEMOTHERAPY ALERT CARD or IMMUNOTHERAPY ALERT CARD  at check-in to the Emergency Department and triage nurse.  Should you have questions after your visit or need to cancel or reschedule your appointment, please contact [facility]  Dept: [phone]  and follow the prompts.  Office hours are 8:00 a.m. to 4:30 p.m. Monday - Friday. Please note that voicemails left after 4:00 p.m. may not be returned until the following business day.  We are closed weekends and major holidays. You have access to a nurse at all times for urgent questions. Please call the main number to the clinic Dept: [phone] and follow the prompts.   For any non-urgent questions, you may also contact your provider using MyChart. We now offer e-Visits for anyone 72 and older to request care online for non-urgent symptoms. For details visit [email or web address].   Also download the MyChart app! Go to the app store, search "MyChart", open the app, select [place], and log in with your MyChart username and password. Cemiplimab Injection What is this medication? CEMIPLIMAB (se MIP li mab) treats skin cancer and lung cancer. It works by helping your immune system slow or stop the spread of cancer cells. It is a monoclonal antibody. This medicine may be used for other purposes; ask your health care provider or pharmacist if you have questions. COMMON BRAND NAME(S): LIBTAYO What should I tell my care team before I take this medication? They need to know if you have any of these conditions: Allogeneic stem cell transplant (uses someone else's stem cells) Autoimmune diseases, such as Crohn disease, ulcerative colitis, lupus History of chest radiation Nervous system problems, such  as Guillain-Barre syndrome or myasthenia gravis Organ transplant An unusual or allergic reaction to cemiplimab, other medications, foods, dyes, or preservatives Pregnant or trying to get pregnant Breastfeeding How should I use this medication? This medication is  infused into a vein. It is given by your care team in a hospital or clinic setting. A special MedGuide will be given to you before each treatment. Be sure to read this information carefully each time. Talk to your care team about the use of this medication in children. Special care may be needed. Overdosage: If you think you have taken too much of this medicine contact a poison control center or emergency room at once. NOTE: This medicine is only for you. Do not share this medicine with others. What if I miss a dose? Keep appointments for follow-up doses. It is important not to miss your dose. Call your care team if you are unable to keep an appointment. What may interact with this medication? Interactions have not been studied. This list may not describe all possible interactions. Give your health care provider a list of all the medicines, herbs, non-prescription drugs, or dietary supplements you use. Also tell them if you smoke, drink alcohol, or use illegal drugs. Some items may interact with your medicine. What should I watch for while using this medication? Visit your care team for regular checks on your progress. It may be some time before you see the benefit from this medication. You may need blood work done while taking this medication. This medication may cause serious skin reactions. They can happen weeks to months after starting the medication. Contact your care team right away if you notice fevers or flu-like symptoms with a rash. The rash may be red or purple and then turn into blisters or peeling of the skin. You may also notice a red rash with swelling of the face, lips, or lymph nodes in your neck or under your arms. Tell your care team right away if you have any change in your eyesight. Talk to your care team if you may be pregnant. You will need a negative pregnancy test before starting this medication. Contraception is recommended while taking this medication and for 4 months after  the last dose. Your care team can help you find the option that works for you. Do not breastfeed while taking this medication and for at least 4 months after the last dose. What side effects may I notice from receiving this medication? Side effects that you should report to your care team as soon as possible: Allergic reactions--skin rash, itching, hives, swelling of the face, lips, tongue, or throat Dry cough, shortness of breath or trouble breathing Eye pain, redness, irritation, or discharge with blurry or decreased vision Heart muscle inflammation--unusual weakness or fatigue, shortness of breath, chest pain, fast or irregular heartbeat, dizziness, swelling of the ankles, feet, or hands Hormone gland problems--headache, sensitivity to light, unusual weakness or fatigue, dizziness, fast or irregular heartbeat, increased sensitivity to cold or heat, excessive sweating, constipation, hair loss, increased thirst or amount of urine, tremors or shaking, irritability Infusion reactions--chest pain, shortness of breath or trouble breathing, feeling faint or lightheaded Kidney injury (glomerulonephritis)--decrease in the amount of urine, red or dark brown urine, foamy or bubbly urine, swelling of the ankles, hands, or feet Liver injury--right upper belly pain, loss of appetite, nausea, light-colored stool, dark yellow or brown urine, yellowing skin or eyes, unusual weakness or fatigue Pain, tingling, or numbness in the hands or feet, muscle weakness,  change in vision, confusion or trouble speaking, loss of balance or coordination, trouble walking, seizures Rash, fever, and swollen lymph nodes Redness, blistering, peeling, or loosening of the skin, including inside the mouth Sudden or severe stomach pain, bloody diarrhea, fever, nausea, vomiting Side effects that usually do not require medical attention (report these to your care team if they continue or are bothersome): Bone, joint, or muscle  pain Diarrhea Fatigue Loss of appetite Nausea Skin rash This list may not describe all possible side effects. Call your doctor for medical advice about side effects. You may report side effects to FDA at 1-800-FDA-1088. Where should I keep my medication? This medication is given in a hospital or clinic and will not be stored at home. NOTE: This sheet is a summary. It may not cover all possible information. If you have questions about this medicine, talk to your doctor, pharmacist, or health care provider.  2024 Elsevier/Gold Standard (2022-07-22 00:00:00)

## 2022-11-16 LAB — T4: T4, Total: 6.9 ug/dL (ref 4.5–12.0)

## 2022-11-20 ENCOUNTER — Other Ambulatory Visit: Payer: Self-pay

## 2022-11-28 DIAGNOSIS — D225 Melanocytic nevi of trunk: Secondary | ICD-10-CM | POA: Diagnosis not present

## 2022-11-28 DIAGNOSIS — L821 Other seborrheic keratosis: Secondary | ICD-10-CM | POA: Diagnosis not present

## 2022-11-28 DIAGNOSIS — L570 Actinic keratosis: Secondary | ICD-10-CM | POA: Diagnosis not present

## 2022-11-28 DIAGNOSIS — Z85828 Personal history of other malignant neoplasm of skin: Secondary | ICD-10-CM | POA: Diagnosis not present

## 2022-12-05 ENCOUNTER — Inpatient Hospital Stay: Payer: Medicare Other

## 2022-12-05 ENCOUNTER — Other Ambulatory Visit: Payer: Self-pay

## 2022-12-05 ENCOUNTER — Inpatient Hospital Stay (HOSPITAL_BASED_OUTPATIENT_CLINIC_OR_DEPARTMENT_OTHER): Payer: Medicare Other | Admitting: Internal Medicine

## 2022-12-05 ENCOUNTER — Inpatient Hospital Stay: Payer: Medicare Other | Attending: Oncology

## 2022-12-05 VITALS — BP 146/69 | HR 67 | Temp 97.9°F | Resp 16 | Ht 70.0 in | Wt 134.2 lb

## 2022-12-05 DIAGNOSIS — C7800 Secondary malignant neoplasm of unspecified lung: Secondary | ICD-10-CM | POA: Diagnosis not present

## 2022-12-05 DIAGNOSIS — C7989 Secondary malignant neoplasm of other specified sites: Secondary | ICD-10-CM | POA: Diagnosis not present

## 2022-12-05 DIAGNOSIS — C44329 Squamous cell carcinoma of skin of other parts of face: Secondary | ICD-10-CM | POA: Diagnosis not present

## 2022-12-05 DIAGNOSIS — Z79620 Long term (current) use of immunosuppressive biologic: Secondary | ICD-10-CM | POA: Insufficient documentation

## 2022-12-05 DIAGNOSIS — Z5112 Encounter for antineoplastic immunotherapy: Secondary | ICD-10-CM | POA: Insufficient documentation

## 2022-12-05 LAB — CMP (CANCER CENTER ONLY)
ALT: 20 U/L (ref 0–44)
AST: 17 U/L (ref 15–41)
Albumin: 4 g/dL (ref 3.5–5.0)
Alkaline Phosphatase: 95 U/L (ref 38–126)
Anion gap: 8 (ref 5–15)
BUN: 13 mg/dL (ref 8–23)
CO2: 28 mmol/L (ref 22–32)
Calcium: 9.7 mg/dL (ref 8.9–10.3)
Chloride: 104 mmol/L (ref 98–111)
Creatinine: 0.91 mg/dL (ref 0.61–1.24)
GFR, Estimated: >60 mL/min (ref >60)
Glucose, Bld: 226 mg/dL — ABNORMAL HIGH (ref 70–99)
Potassium: 3.8 mmol/L (ref 3.5–5.1)
Sodium: 140 mmol/L (ref 135–145)
Total Bilirubin: 0.6 mg/dL (ref 0.3–1.2)
Total Protein: 6.5 g/dL (ref 6.5–8.1)

## 2022-12-05 LAB — CBC WITH DIFFERENTIAL (CANCER CENTER ONLY)
Abs Immature Granulocytes: 0.01 10*3/uL (ref 0.00–0.07)
Basophils Absolute: 0 10*3/uL (ref 0.0–0.1)
Basophils Relative: 0 %
Eosinophils Absolute: 0.2 10*3/uL (ref 0.0–0.5)
Eosinophils Relative: 4 %
HCT: 39 % (ref 39.0–52.0)
Hemoglobin: 13.1 g/dL (ref 13.0–17.0)
Immature Granulocytes: 0 %
Lymphocytes Relative: 23 %
Lymphs Abs: 1.2 10*3/uL (ref 0.7–4.0)
MCH: 30 pg (ref 26.0–34.0)
MCHC: 33.6 g/dL (ref 30.0–36.0)
MCV: 89.4 fL (ref 80.0–100.0)
Monocytes Absolute: 0.4 10*3/uL (ref 0.1–1.0)
Monocytes Relative: 8 %
Neutro Abs: 3.2 10*3/uL (ref 1.7–7.7)
Neutrophils Relative %: 65 %
Platelet Count: 197 10*3/uL (ref 150–400)
RBC: 4.36 MIL/uL (ref 4.22–5.81)
RDW: 13.3 % (ref 11.5–15.5)
WBC Count: 5 10*3/uL (ref 4.0–10.5)
nRBC: 0 % (ref 0.0–0.2)

## 2022-12-05 LAB — TSH: TSH: 2.23 u[IU]/mL (ref 0.350–4.500)

## 2022-12-05 MED ORDER — SODIUM CHLORIDE 0.9 % IV SOLN
350.0000 mg | Freq: Once | INTRAVENOUS | Status: AC
  Administered 2022-12-05: 350 mg via INTRAVENOUS
  Filled 2022-12-05: qty 7

## 2022-12-05 MED ORDER — SODIUM CHLORIDE 0.9 % IV SOLN: Freq: Once | INTRAVENOUS | Status: AC

## 2022-12-05 NOTE — Patient Instructions (Signed)
[facility]  Discharge Instructions: Thank you for choosing [facility] to provide your oncology and hematology care.   If you have a lab appointment with the Cancer Center, please go directly to the Cancer Center and check in at the registration area.   Wear comfortable clothing and clothing appropriate for easy access to any Portacath or PICC line.   We strive to give you quality time with your provider. You may need to reschedule your appointment if you arrive late (15 or more minutes).  Arriving late affects you and other patients whose appointments are after yours.  Also, if you miss three or more appointments without notifying the office, you may be dismissed from the clinic at the provider's discretion.      For prescription refill requests, have your pharmacy contact our office and allow 72 hours for refills to be completed.    Today you received the following chemotherapy and/or immunotherapy agent: Cemiplimab (Libtayo)   To help prevent nausea and vomiting after your treatment, we encourage you to take your nausea medication as directed.  BELOW ARE SYMPTOMS THAT SHOULD BE REPORTED IMMEDIATELY: *FEVER GREATER THAN 100.4 F (38 C) OR HIGHER *CHILLS OR SWEATING *NAUSEA AND VOMITING THAT IS NOT CONTROLLED WITH YOUR NAUSEA MEDICATION *UNUSUAL SHORTNESS OF BREATH *UNUSUAL BRUISING OR BLEEDING *URINARY PROBLEMS (pain or burning when urinating, or frequent urination) *BOWEL PROBLEMS (unusual diarrhea, constipation, pain near the anus) TENDERNESS IN MOUTH AND THROAT WITH OR WITHOUT PRESENCE OF ULCERS (sore throat, sores in mouth, or a toothache) UNUSUAL RASH, SWELLING OR PAIN  UNUSUAL VAGINAL DISCHARGE OR ITCHING   Items with * indicate a potential emergency and should be followed up as soon as possible or go to the Emergency Department if any problems should occur.  Please show the CHEMOTHERAPY ALERT CARD or IMMUNOTHERAPY ALERT CARD  at check-in to the Emergency Department and triage nurse.  Should you have questions after your visit or need to cancel or reschedule your appointment, please contact [facility]  Dept: [phone]  and follow the prompts.  Office hours are 8:00 a.m. to 4:30 p.m. Monday - Friday. Please note that voicemails left after 4:00 p.m. may not be returned until the following business day.  We are closed weekends and major holidays. You have access to a nurse at all times for urgent questions. Please call the main number to the clinic Dept: [phone] and follow the prompts.   For any non-urgent questions, you may also contact your provider using MyChart. We now offer e-Visits for anyone 18 and older to request care online for non-urgent symptoms. For details visit [email or web address].   Also download the MyChart app! Go to the app store, search "MyChart", open the app, select [facility], and log in with your MyChart username and password. Cemiplimab Injection What is this medication? CEMIPLIMAB (se MIP li mab) treats skin cancer and lung cancer. It works by helping your immune system slow or stop the spread of cancer cells. It is a monoclonal antibody. This medicine may be used for other purposes; ask your health care provider or pharmacist if you have questions. COMMON BRAND NAME(S): LIBTAYO What should I tell my care team before I take this medication? They need to know if you have any of these conditions: Allogeneic stem cell transplant (uses someone else's stem cells) Autoimmune diseases, such as Crohn disease, ulcerative colitis, lupus History of chest radiation Nervous system problems, such   as Guillain-Barre syndrome or myasthenia gravis Organ transplant An unusual or allergic reaction to cemiplimab, other medications, foods, dyes, or preservatives Pregnant or trying to get pregnant Breastfeeding How should I use this medication? This medication is  infused into a vein. It is given by your care team in a hospital or clinic setting. A special MedGuide will be given to you before each treatment. Be sure to read this information carefully each time. Talk to your care team about the use of this medication in children. Special care may be needed. Overdosage: If you think you have taken too much of this medicine contact a poison control center or emergency room at once. NOTE: This medicine is only for you. Do not share this medicine with others. What if I miss a dose? Keep appointments for follow-up doses. It is important not to miss your dose. Call your care team if you are unable to keep an appointment. What may interact with this medication? Interactions have not been studied. This list may not describe all possible interactions. Give your health care provider a list of all the medicines, herbs, non-prescription drugs, or dietary supplements you use. Also tell them if you smoke, drink alcohol, or use illegal drugs. Some items may interact with your medicine. What should I watch for while using this medication? Visit your care team for regular checks on your progress. It may be some time before you see the benefit from this medication. You may need blood work done while taking this medication. This medication may cause serious skin reactions. They can happen weeks to months after starting the medication. Contact your care team right away if you notice fevers or flu-like symptoms with a rash. The rash may be red or purple and then turn into blisters or peeling of the skin. You may also notice a red rash with swelling of the face, lips, or lymph nodes in your neck or under your arms. Tell your care team right away if you have any change in your eyesight. Talk to your care team if you may be pregnant. You will need a negative pregnancy test before starting this medication. Contraception is recommended while taking this medication and for 4 months after  the last dose. Your care team can help you find the option that works for you. Do not breastfeed while taking this medication and for at least 4 months after the last dose. What side effects may I notice from receiving this medication? Side effects that you should report to your care team as soon as possible: Allergic reactions--skin rash, itching, hives, swelling of the face, lips, tongue, or throat Dry cough, shortness of breath or trouble breathing Eye pain, redness, irritation, or discharge with blurry or decreased vision Heart muscle inflammation--unusual weakness or fatigue, shortness of breath, chest pain, fast or irregular heartbeat, dizziness, swelling of the ankles, feet, or hands Hormone gland problems--headache, sensitivity to light, unusual weakness or fatigue, dizziness, fast or irregular heartbeat, increased sensitivity to cold or heat, excessive sweating, constipation, hair loss, increased thirst or amount of urine, tremors or shaking, irritability Infusion reactions--chest pain, shortness of breath or trouble breathing, feeling faint or lightheaded Kidney injury (glomerulonephritis)--decrease in the amount of urine, red or dark brown urine, foamy or bubbly urine, swelling of the ankles, hands, or feet Liver injury--right upper belly pain, loss of appetite, nausea, light-colored stool, dark yellow or brown urine, yellowing skin or eyes, unusual weakness or fatigue Pain, tingling, or numbness in the hands or feet, muscle weakness,   change in vision, confusion or trouble speaking, loss of balance or coordination, trouble walking, seizures Rash, fever, and swollen lymph nodes Redness, blistering, peeling, or loosening of the skin, including inside the mouth Sudden or severe stomach pain, bloody diarrhea, fever, nausea, vomiting Side effects that usually do not require medical attention (report these to your care team if they continue or are bothersome): Bone, joint, or muscle  pain Diarrhea Fatigue Loss of appetite Nausea Skin rash This list may not describe all possible side effects. Call your doctor for medical advice about side effects. You may report side effects to FDA at 1-800-FDA-1088. Where should I keep my medication? This medication is given in a hospital or clinic and will not be stored at home. NOTE: This sheet is a summary. It may not cover all possible information. If you have questions about this medicine, talk to your doctor, pharmacist, or health care provider.  2024 Elsevier/Gold Standard (2022-07-22 00:00:00)    

## 2022-12-05 NOTE — Progress Notes (Signed)
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 He initially presented with skin cancer around the parotid gland in April 2022.   PRIOR THERAPY: 1) status post left parotidectomy with skin resection and rotational flap completed by Dr. Pollyann Kennedy in April 2022. Final pathology showed squamous cell carcinoma.  2) status post radiation therapy under the care of Dr. Basilio Cairo completing 33 fractions on December 19, 2020 for a total of 66 Gray   CURRENT THERAPY: Libtayo (Cempilimab) 350 Mg IV every 3 weeks started August 10, 2021.  Status post 22 cycles of treatment.  INTERVAL HISTORY: Shaun Mcmillan 82 y.o. male returns to the clinic today for follow-up visit.  The patient is feeling fine today with no concerning complaints.  He celebrated his 82nd birthday 10 days ago.  He is feeling fine with no chest pain, shortness of breath, cough or hemoptysis.  He has no nausea, vomiting, diarrhea or constipation.  He has no headache or visual changes.  He denied having any recent weight loss or night sweats.  He is here today for evaluation before starting cycle #23 of his treatment.  MEDICAL HISTORY: Past Medical History:  Diagnosis Date   Arthritis    Diabetes mellitus type II    Glaucoma    sees Dr. 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  MEDICATIONS:  Current Outpatient Medications  Medication Sig Dispense Refill   acetaminophen (TYLENOL) 500 MG tablet Take 1,000 mg by mouth every 8 (eight) hours as needed for moderate pain.     amLODipine (NORVASC) 10 MG  tablet TAKE 1 TABLET BY MOUTH DAILY 90 tablet 3   aspirin EC 81 MG tablet Take 1 tablet (81 mg total) by mouth daily. Swallow whole. 30 tablet 11   atorvastatin (LIPITOR) 40 MG tablet TAKE 1 TABLET BY MOUTH DAILY 90 tablet 3   Cholecalciferol (VITAMIN D PO) Take 5,000 Units by mouth daily.     gabapentin (NEURONTIN) 300 MG capsule TAKE 2 CAPSULES BY MOUTH 3 TIMES A DAY 180 capsule 1   latanoprost (XALATAN) 0.005 % ophthalmic solution Place 1 drop into both eyes every morning. [facility]  [address]     LORazepam (ATIVAN) 1 MG tablet Take 1 tablet (1 mg total) by mouth 3 (three) times daily as needed for anxiety. 60 tablet 5   losartan (COZAAR) 25 MG tablet Take 1 tablet (25 mg total) by mouth daily. 90 tablet 3   Melatonin 5 MG TABS Take 5 mg by mouth at bedtime. [facility] [address]     metFORMIN (GLUCOPHAGE) 500 MG tablet TAKE 1 TABLET BY MOUTH TWICE  DAILY WITH A MEAL 180 tablet 3   Multiple Vitamin (MULTIVITAMIN) tablet Take 1 tablet by mouth daily.     nystatin (MYCOSTATIN) 100000 UNIT/ML suspension Take 5 mLs (500,000 Units total) by mouth 4 (four) times daily. 60 mL 0   prochlorperazine (COMPAZINE) 10 MG tablet Take 1 tablet (10 mg total) by mouth every 6 (six) hours  as needed for nausea or vomiting. 30 tablet 0   timolol (BETIMOL) 0.5 % ophthalmic solution Place 1 drop into both eyes daily. [facility] [address]     No current facility-administered medications for this visit.    SURGICAL HISTORY:  Past Surgical History:  Procedure Laterality Date   APPENDECTOMY     colonscopy  03/12/2016   per Dr. Ewing Schlein, benign polyps, repeat in 5 yrs   left craniotomy  11/03/08   per Dr. Marikay Alar for a subdrual hematoma   PAROTIDECTOMY Left 09/20/2020   Procedure: PAROTIDECTOMY with resection of skin and rotational flap;  Surgeon: Serena Colonel, MD;  Location: [facility] OR;  Service: ENT;  Laterality: Left;   surgery for ocmpound fracture     right leg    REVIEW OF SYSTEMS:  A comprehensive review of systems was  negative.   PHYSICAL EXAMINATION: General appearance: alert, cooperative, and no distress Head: Normocephalic, without obvious abnormality, atraumatic Neck: no adenopathy, no JVD, supple, symmetrical, trachea midline, and thyroid not enlarged, symmetric, no tenderness/mass/nodules Lymph nodes: Cervical, supraclavicular, and axillary nodes normal. Resp: clear to auscultation bilaterally Back: symmetric, no curvature. ROM normal. No CVA tenderness. Cardio: regular rate and rhythm, S1, S2 normal, no murmur, click, rub or gallop GI: soft, non-tender; bowel sounds normal; no masses,  no organomegaly Extremities: extremities normal, atraumatic, no cyanosis or edema  ECOG PERFORMANCE STATUS: 1 - Symptomatic but completely ambulatory  Blood pressure (!) 146/69, pulse 67, temperature 97.9 F (36.6 C), temperature source Oral, resp. rate 16, height 5\' 10"  (1.778 m), weight 134 lb 3.2 oz (60.9 kg), SpO2 98 %.  LABORATORY DATA: Lab Results  Component Value Date   WBC 5.0 12/05/2022   HGB 13.1 12/05/2022   HCT 39.0 12/05/2022   MCV 89.4 12/05/2022   PLT 197 12/05/2022      Chemistry      Component Value Date/Time   NA 140 12/05/2022 0843   K 3.8 12/05/2022 0843   CL 104 12/05/2022 0843   CO2 28 12/05/2022 0843   BUN 13 12/05/2022 0843   CREATININE 0.91 12/05/2022 0843   CREATININE 0.69 (L) 01/08/2021 1553      Component Value Date/Time   CALCIUM 9.7 12/05/2022 0843   ALKPHOS 95 12/05/2022 0843   AST 17 12/05/2022 0843   ALT 20 12/05/2022 0843   BILITOT 0.6 12/05/2022 0843       RADIOGRAPHIC STUDIES: No results found.  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  He had evidence for disease progression  and metastasis to the lung in February 2023. The patient is currently undergoing treatment with immunotherapy with Libtayo (Cempilimab) 350 Mg IV every 3 weeks status post 22 cycles.   The patient has been tolerating this treatment well with no concerning adverse effects. I recommended for him to proceed with cycle #23 today as planned. I will repeat his imaging studies after the next cycle of his treatment. The patient was advised to call immediately if he has any other concerning symptoms in the interval. The patient voices understanding of current disease status and treatment options and is in agreement with the current care plan.  All questions were answered. The patient knows to call the clinic with any problems, questions or  concerns. We can certainly see the patient much sooner if necessary.  The total time spent in the appointment was 20 minutes.  Disclaimer: This note was dictated with voice recognition software. Similar sounding words can inadvertently be transcribed and may not be corrected upon review.

## 2022-12-07 LAB — T4: T4, Total: 7.3 ug/dL (ref 4.5–12.0)

## 2022-12-11 ENCOUNTER — Other Ambulatory Visit: Payer: Self-pay

## 2022-12-16 ENCOUNTER — Telehealth: Payer: Self-pay | Admitting: Internal Medicine

## 2022-12-16 NOTE — Telephone Encounter (Signed)
Called patient regarding July/August appointments, patient is notified.  

## 2022-12-22 ENCOUNTER — Other Ambulatory Visit: Payer: Self-pay

## 2022-12-24 NOTE — Progress Notes (Unsigned)
[facility] OFFICE PROGRESS NOTE  [name], MD 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 He initially presented with skin cancer around the parotid gland in April 2022.   PRIOR THERAPY: 1) status post left parotidectomy with skin resection and rotational flap completed by Dr. Pollyann Kennedy in April 2022. Final pathology showed squamous cell carcinoma.  2) status post radiation therapy under the care of Dr. Basilio Cairo completing 33 fractions on December 19, 2020 for a total of 66 Gray  CURRENT THERAPY: Libtayo (Cempilimab) 350 Mg IV every 3 weeks started August 10, 2021. Status post 23 cycles of treatment   INTERVAL HISTORY: Shaun Mcmillan 82 y.o. male returns to the clinic today for a follow-up visit. The patient was last seen 3 weeks ago by Dr. Arbutus Ped. The patient is currently undergoing immunotherapy with Libtayo every 3 weeks. He tolerates it well without any concerning adverse side effects. I had sent him a prescription for nystatin in February 2024 and he states this helped and is wanting a refill. He struggles with dry mouth and black/brown tongue. He saw his dentist and unclear etiology. Today he denies any fever, chills, or night sweats. He does not always have very good appetite. He sees ENT and had a swallow study performed by a speech therapist for his aspiration/dysphagia. They told him to tuck his chin to the right when he swallows which has helped. Denies any chest pain, shortness of breath, or hemoptysis.  Denies any significant cough.  Denies any nausea, vomiting, diarrhea, or constipation.  Denies any headache or visual changes.  Denies any rashes or skin changes. He saw Dr. Yetta Barre from dermatology on ~11/28/22. He had a few places on his skin frozen off. Dr. Yetta Barre monitors him closely every 2-3 months.  He is here today for evaluation and repeat blood work before undergoing  cycle #24.  MEDICAL HISTORY: Past Medical History:  Diagnosis Date   Arthritis    Diabetes mellitus type II    Glaucoma    sees Dr. 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  MEDICATIONS:  Current Outpatient Medications  Medication Sig Dispense Refill   acetaminophen (TYLENOL) 500 MG tablet Take 1,000 mg by mouth every 8 (eight) hours as needed for moderate pain.     amLODipine (NORVASC) 10 MG tablet TAKE 1 TABLET BY MOUTH DAILY 90 tablet 3   aspirin EC 81 MG tablet Take 1 tablet (81 mg total) by mouth daily. Swallow whole. 30 tablet 11   atorvastatin (LIPITOR) 40 MG tablet TAKE 1 TABLET BY MOUTH DAILY 90 tablet 3   Cholecalciferol (VITAMIN D PO) Take 5,000 Units by mouth daily.     gabapentin (NEURONTIN) 300 MG capsule TAKE 2 CAPSULES BY MOUTH 3 TIMES A DAY 180 capsule 1   latanoprost (XALATAN) 0.005 % ophthalmic solution Place 1 drop into both eyes every morning. [facility]  [address]     LORazepam (ATIVAN) 1 MG tablet Take 1 tablet (1 mg total) by mouth 3 (three) times daily as needed for anxiety. 60 tablet 5   losartan (COZAAR) 25 MG tablet  Take 1 tablet (25 mg total) by mouth daily. 90 tablet 3   Melatonin 5 MG TABS Take 5 mg by mouth at bedtime. [facility] [address]     metFORMIN (GLUCOPHAGE) 500 MG tablet TAKE 1 TABLET BY MOUTH TWICE  DAILY WITH A MEAL 180 tablet 3   Multiple Vitamin (MULTIVITAMIN) tablet Take 1 tablet by mouth daily.     nystatin (MYCOSTATIN) 100000 UNIT/ML suspension Take 5 mLs (500,000 Units total) by mouth 4 (four) times daily. 60 mL 1   prochlorperazine (COMPAZINE) 10 MG tablet Take 1 tablet (10 mg total) by mouth every 6 (six) hours as needed for nausea or vomiting. 30 tablet 0   timolol (BETIMOL) 0.5 % ophthalmic solution Place 1 drop into both  eyes daily. [facility] [address]     No current facility-administered medications for this visit.    SURGICAL HISTORY:  Past Surgical History:  Procedure Laterality Date   APPENDECTOMY     colonscopy  03/12/2016   per Dr. Ewing Schlein, benign polyps, repeat in 5 yrs   left craniotomy  11/03/08   per Dr. Marikay Alar for a subdrual hematoma   PAROTIDECTOMY Left 09/20/2020   Procedure: PAROTIDECTOMY with resection of skin and rotational flap;  Surgeon: Serena Colonel, MD;  Location: [facility] OR;  Service: ENT;  Laterality: Left;   surgery for ocmpound fracture     right leg    REVIEW OF SYSTEMS:   Constitutional: Positive for diminished appetite. Negative for chills, fatigue, fever and unexpected weight change.  HENT: Positive for some paralysis on left cheek/face. Positive for black growth on tongue. Positive for stable dysphagia. Negative for mouth sores, nosebleeds, sore throat.  Eyes: Negative for eye problems and icterus.  Respiratory: Negative for hemoptysis, cough, shortness of breath and wheezing.   Cardiovascular: Negative for chest pain and leg swelling.  Gastrointestinal: Negative for abdominal pain, constipation, diarrhea, nausea and vomiting.  Genitourinary: Negative for bladder incontinence, difficulty urinating, dysuria, frequency and hematuria.   Musculoskeletal: Negative for back pain, gait problem, neck pain and neck stiffness.  Skin: Negative for itching and rash.  Neurological: Negative for dizziness, extremity weakness, gait problem, headaches, light-headedness and seizures.  Hematological: Negative for adenopathy. Does not bruise/bleed easily.  Psychiatric/Behavioral: Negative for confusion, depression and sleep disturbance. The patient is not nervous/anxious.    PHYSICAL EXAMINATION:  Blood pressure (!) 120/59, pulse 71, temperature 97.7 F (36.5 C), temperature source Temporal, resp. rate 14, weight 133 lb 14.4 oz (60.7 kg), SpO2 99%.  ECOG PERFORMANCE STATUS: 1  Physical Exam   Constitutional: Oriented to person, place, and time and thin appearing male, and in no distress.  HENT:  Head: Normocephalic and atraumatic. 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  Mouth/Throat: Oropharynx is clear and moist. Positive for hyperpigmented growth on tongue.  Cardiovascular: Normal rate, regular rhythm, normal heart sounds and intact distal pulses.   Pulmonary/Chest: Effort normal and breath sounds normal. No respiratory distress. No wheezes. No rales.  Abdominal: Soft. Bowel sounds are normal. Exhibits no distension and no mass. There is no tenderness.  Musculoskeletal: Normal range of motion. Exhibits no edema.  Lymphadenopathy:    No cervical adenopathy.  Neurological: Alert and oriented to person, place, and time. Exhibits normal muscle tone. Gait normal. Coordination normal.  Skin: Skin is warm and dry. No rash noted. Not diaphoretic. No erythema. No pallor.  Psychiatric: Mood, memory and judgment normal.  Vitals reviewed.  LABORATORY DATA: Lab Results  Component Value Date  WBC 6.2 12/26/2022   HGB 13.1 12/26/2022   HCT 38.2 (L) 12/26/2022   MCV 89.0 12/26/2022   PLT 211 12/26/2022      Chemistry      Component Value Date/Time   NA 140 12/26/2022 1108   K 3.8 12/26/2022 1108   CL 104 12/26/2022 1108   CO2 28 12/26/2022 1108   BUN 19 12/26/2022 1108   CREATININE 0.88 12/26/2022 1108   CREATININE 0.69 (L) 01/08/2021 1553      Component Value Date/Time   CALCIUM 9.7 12/26/2022 1108   ALKPHOS 102 12/26/2022 1108   AST 21 12/26/2022 1108   ALT 23 12/26/2022 1108   BILITOT 0.8 12/26/2022 1108       RADIOGRAPHIC STUDIES:  No results found.   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  He was initially diagnosed in April 2022 with involvement of the skin around the left parotid gland status post left parotidectomy with skin  resection followed by adjuvant radiation.  He then had evidence of disease progression with metastases to the lung in February 2023.   The patient is currently undergoing systemic treatment with immunotherapy with Libtayo 350 mg IV every 3 weeks.  He is status post 23 cycles.  The plan is to complete 2 years of therapy as long as he is tolerating well and there is no evidence of disease progression or unacceptable toxicity.  Labs reviewed.  Recommend that he proceed cycle #24 today   Will arrange for restaging CT scan of the chest, neck, and AP prior to his next cycle of treatment.  He will continue to follow with dermatology.   He will also continue to follow with ENT and speech therapy regarding his aspiration. I have refilled his nystatin. Also encouraged drinking plenty of fluids, salt water rinses, and biotene  The patient was advised to call immediately if she has any concerning symptoms in the interval. The patient voices understanding of current disease status and treatment options and is in agreement with the current care plan. All questions were answered. The patient knows to call the clinic with any problems, questions or concerns. We can certainly see the patient much sooner if necessary    Orders Placed This Encounter  Procedures   CT Soft Tissue Neck W Contrast    Standing Status:   Future    Standing Expiration Date:   12/26/2023    Order Specific Question:   If indicated for the ordered procedure, I authorize the administration of contrast media per Radiology protocol    Answer:   Yes    Order Specific Question:   Does the patient have a contrast media/X-ray dye allergy?    Answer:   No    Order Specific Question:   Preferred imaging location?    Answer:   [facility]   CT CHEST ABDOMEN PELVIS W CONTRAST    Standing Status:   Future    Standing Expiration Date:   12/26/2023    Order Specific Question:   If indicated for the ordered procedure, I authorize the  administration of contrast media per Radiology protocol    Answer:   Yes    Order Specific Question:   Does the patient have a contrast media/X-ray dye allergy?    Answer:   No    Order Specific Question:   Preferred imaging location?    Answer:   [facility]    Order Specific Question:   If indicated for the ordered procedure, I authorize the administration of oral contrast media per Radiology protocol    Answer:   Yes    The total time spent in the appointment was 20-29 minutes  Jun Rightmyer L Lorrena Goranson, PA-C 12/26/22

## 2022-12-26 ENCOUNTER — Other Ambulatory Visit: Payer: Self-pay

## 2022-12-26 ENCOUNTER — Inpatient Hospital Stay: Payer: Medicare Other

## 2022-12-26 ENCOUNTER — Inpatient Hospital Stay (HOSPITAL_BASED_OUTPATIENT_CLINIC_OR_DEPARTMENT_OTHER): Payer: Medicare Other | Admitting: Physician Assistant

## 2022-12-26 ENCOUNTER — Inpatient Hospital Stay: Payer: Medicare Other | Attending: Oncology

## 2022-12-26 VITALS — BP 120/59 | HR 71 | Temp 97.7°F | Resp 14 | Wt 133.9 lb

## 2022-12-26 DIAGNOSIS — Z79620 Long term (current) use of immunosuppressive biologic: Secondary | ICD-10-CM | POA: Insufficient documentation

## 2022-12-26 DIAGNOSIS — Z5112 Encounter for antineoplastic immunotherapy: Secondary | ICD-10-CM | POA: Diagnosis not present

## 2022-12-26 DIAGNOSIS — C7800 Secondary malignant neoplasm of unspecified lung: Secondary | ICD-10-CM | POA: Insufficient documentation

## 2022-12-26 DIAGNOSIS — C44329 Squamous cell carcinoma of skin of other parts of face: Secondary | ICD-10-CM | POA: Diagnosis not present

## 2022-12-26 DIAGNOSIS — C7989 Secondary malignant neoplasm of other specified sites: Secondary | ICD-10-CM | POA: Diagnosis not present

## 2022-12-26 DIAGNOSIS — Z79899 Other long term (current) drug therapy: Secondary | ICD-10-CM | POA: Diagnosis not present

## 2022-12-26 DIAGNOSIS — B370 Candidal stomatitis: Secondary | ICD-10-CM | POA: Diagnosis not present

## 2022-12-26 LAB — CBC WITH DIFFERENTIAL (CANCER CENTER ONLY)
Abs Immature Granulocytes: 0.02 10*3/uL (ref 0.00–0.07)
Basophils Absolute: 0 10*3/uL (ref 0.0–0.1)
Basophils Relative: 1 %
Eosinophils Absolute: 0.2 10*3/uL (ref 0.0–0.5)
Eosinophils Relative: 3 %
HCT: 38.2 % — ABNORMAL LOW (ref 39.0–52.0)
Hemoglobin: 13.1 g/dL (ref 13.0–17.0)
Immature Granulocytes: 0 %
Lymphocytes Relative: 21 %
Lymphs Abs: 1.3 10*3/uL (ref 0.7–4.0)
MCH: 30.5 pg (ref 26.0–34.0)
MCHC: 34.3 g/dL (ref 30.0–36.0)
MCV: 89 fL (ref 80.0–100.0)
Monocytes Absolute: 0.4 10*3/uL (ref 0.1–1.0)
Monocytes Relative: 6 %
Neutro Abs: 4.3 10*3/uL (ref 1.7–7.7)
Neutrophils Relative %: 69 %
Platelet Count: 211 10*3/uL (ref 150–400)
RBC: 4.29 MIL/uL (ref 4.22–5.81)
RDW: 13.6 % (ref 11.5–15.5)
WBC Count: 6.2 10*3/uL (ref 4.0–10.5)
nRBC: 0 % (ref 0.0–0.2)

## 2022-12-26 LAB — CMP (CANCER CENTER ONLY)
ALT: 23 U/L (ref 0–44)
AST: 21 U/L (ref 15–41)
Albumin: 4 g/dL (ref 3.5–5.0)
Alkaline Phosphatase: 102 U/L (ref 38–126)
Anion gap: 8 (ref 5–15)
BUN: 19 mg/dL (ref 8–23)
CO2: 28 mmol/L (ref 22–32)
Calcium: 9.7 mg/dL (ref 8.9–10.3)
Chloride: 104 mmol/L (ref 98–111)
Creatinine: 0.88 mg/dL (ref 0.61–1.24)
GFR, Estimated: >60 mL/min (ref >60)
Glucose, Bld: 160 mg/dL — ABNORMAL HIGH (ref 70–99)
Potassium: 3.8 mmol/L (ref 3.5–5.1)
Sodium: 140 mmol/L (ref 135–145)
Total Bilirubin: 0.8 mg/dL (ref 0.3–1.2)
Total Protein: 7 g/dL (ref 6.5–8.1)

## 2022-12-26 LAB — TSH: TSH: 2.614 u[IU]/mL (ref 0.350–4.500)

## 2022-12-26 MED ORDER — SODIUM CHLORIDE 0.9 % IV SOLN
350.0000 mg | Freq: Once | INTRAVENOUS | Status: AC
  Administered 2022-12-26: 350 mg via INTRAVENOUS
  Filled 2022-12-26: qty 7

## 2022-12-26 MED ORDER — SODIUM CHLORIDE 0.9 % IV SOLN: Freq: Once | INTRAVENOUS | Status: AC

## 2022-12-26 MED ORDER — NYSTATIN 100000 UNIT/ML MT SUSP
5.0000 mL | Freq: Four times a day (QID) | OROMUCOSAL | 1 refills | Status: AC
Start: 2022-12-26 — End: ?

## 2022-12-26 NOTE — Patient Instructions (Signed)
[facility]  Discharge Instructions: Thank you for choosing [facility] to provide your oncology and hematology care.   If you have a lab appointment with the Cancer Center, please go directly to the Cancer Center and check in at the registration area.   Wear comfortable clothing and clothing appropriate for easy access to any Portacath or PICC line.   We strive to give you quality time with your provider. You may need to reschedule your appointment if you arrive late (15 or more minutes).  Arriving late affects you and other patients whose appointments are after yours.  Also, if you miss three or more appointments without notifying the office, you may be dismissed from the clinic at the provider's discretion.      For prescription refill requests, have your pharmacy contact our office and allow 72 hours for refills to be completed.    Today you received the following chemotherapy and/or immunotherapy agent: Cemiplimab (Libtayo)   To help prevent nausea and vomiting after your treatment, we encourage you to take your nausea medication as directed.  BELOW ARE SYMPTOMS THAT SHOULD BE REPORTED IMMEDIATELY: *FEVER GREATER THAN 100.4 F (38 C) OR HIGHER *CHILLS OR SWEATING *NAUSEA AND VOMITING THAT IS NOT CONTROLLED WITH YOUR NAUSEA MEDICATION *UNUSUAL SHORTNESS OF BREATH *UNUSUAL BRUISING OR BLEEDING *URINARY PROBLEMS (pain or burning when urinating, or frequent urination) *BOWEL PROBLEMS (unusual diarrhea, constipation, pain near the anus) TENDERNESS IN MOUTH AND THROAT WITH OR WITHOUT PRESENCE OF ULCERS (sore throat, sores in mouth, or a toothache) UNUSUAL RASH, SWELLING OR PAIN  UNUSUAL VAGINAL DISCHARGE OR ITCHING   Items with * indicate a potential emergency and should be followed up as soon as possible or go to the Emergency Department if any problems should occur.  Please show the CHEMOTHERAPY ALERT CARD or IMMUNOTHERAPY ALERT CARD  at check-in to the Emergency Department and triage nurse.  Should you have questions after your visit or need to cancel or reschedule your appointment, please contact [facility]  Dept: [phone]  and follow the prompts.  Office hours are 8:00 a.m. to 4:30 p.m. Monday - Friday. Please note that voicemails left after 4:00 p.m. may not be returned until the following business day.  We are closed weekends and major holidays. You have access to a nurse at all times for urgent questions. Please call the main number to the clinic Dept: [phone] and follow the prompts.   For any non-urgent questions, you may also contact your provider using MyChart. We now offer e-Visits for anyone 18 and older to request care online for non-urgent symptoms. For details visit [email or web address].   Also download the MyChart app! Go to the app store, search "MyChart", open the app, select [place], and log in with your MyChart username and password. Cemiplimab Injection What is this medication? CEMIPLIMAB (se MIP li mab) treats skin cancer and lung cancer. It works by helping your immune system slow or stop the spread of cancer cells. It is a monoclonal antibody. This medicine may be used for other purposes; ask your health care provider or pharmacist if you have questions. COMMON BRAND NAME(S): LIBTAYO What should I tell my care team before I take this medication? They need to know if you have any of these conditions: Allogeneic stem cell transplant (uses someone else's stem cells) Autoimmune diseases, such as Crohn disease, ulcerative colitis, lupus History of chest radiation Nervous system problems, such   as Guillain-Barre syndrome or myasthenia gravis Organ transplant An unusual or allergic reaction to cemiplimab, other medications, foods, dyes, or preservatives Pregnant or trying to get pregnant Breastfeeding How should I use this medication? This medication is  infused into a vein. It is given by your care team in a hospital or clinic setting. A special MedGuide will be given to you before each treatment. Be sure to read this information carefully each time. Talk to your care team about the use of this medication in children. Special care may be needed. Overdosage: If you think you have taken too much of this medicine contact a poison control center or emergency room at once. NOTE: This medicine is only for you. Do not share this medicine with others. What if I miss a dose? Keep appointments for follow-up doses. It is important not to miss your dose. Call your care team if you are unable to keep an appointment. What may interact with this medication? Interactions have not been studied. This list may not describe all possible interactions. Give your health care provider a list of all the medicines, herbs, non-prescription drugs, or dietary supplements you use. Also tell them if you smoke, drink alcohol, or use illegal drugs. Some items may interact with your medicine. What should I watch for while using this medication? Visit your care team for regular checks on your progress. It may be some time before you see the benefit from this medication. You may need blood work done while taking this medication. This medication may cause serious skin reactions. They can happen weeks to months after starting the medication. Contact your care team right away if you notice fevers or flu-like symptoms with a rash. The rash may be red or purple and then turn into blisters or peeling of the skin. You may also notice a red rash with swelling of the face, lips, or lymph nodes in your neck or under your arms. Tell your care team right away if you have any change in your eyesight. Talk to your care team if you may be pregnant. You will need a negative pregnancy test before starting this medication. Contraception is recommended while taking this medication and for 4 months after  the last dose. Your care team can help you find the option that works for you. Do not breastfeed while taking this medication and for at least 4 months after the last dose. What side effects may I notice from receiving this medication? Side effects that you should report to your care team as soon as possible: Allergic reactions--skin rash, itching, hives, swelling of the face, lips, tongue, or throat Dry cough, shortness of breath or trouble breathing Eye pain, redness, irritation, or discharge with blurry or decreased vision Heart muscle inflammation--unusual weakness or fatigue, shortness of breath, chest pain, fast or irregular heartbeat, dizziness, swelling of the ankles, feet, or hands Hormone gland problems--headache, sensitivity to light, unusual weakness or fatigue, dizziness, fast or irregular heartbeat, increased sensitivity to cold or heat, excessive sweating, constipation, hair loss, increased thirst or amount of urine, tremors or shaking, irritability Infusion reactions--chest pain, shortness of breath or trouble breathing, feeling faint or lightheaded Kidney injury (glomerulonephritis)--decrease in the amount of urine, red or dark brown urine, foamy or bubbly urine, swelling of the ankles, hands, or feet Liver injury--right upper belly pain, loss of appetite, nausea, light-colored stool, dark yellow or brown urine, yellowing skin or eyes, unusual weakness or fatigue Pain, tingling, or numbness in the hands or feet, muscle weakness,   change in vision, confusion or trouble speaking, loss of balance or coordination, trouble walking, seizures Rash, fever, and swollen lymph nodes Redness, blistering, peeling, or loosening of the skin, including inside the mouth Sudden or severe stomach pain, bloody diarrhea, fever, nausea, vomiting Side effects that usually do not require medical attention (report these to your care team if they continue or are bothersome): Bone, joint, or muscle  pain Diarrhea Fatigue Loss of appetite Nausea Skin rash This list may not describe all possible side effects. Call your doctor for medical advice about side effects. You may report side effects to FDA at 1-800-FDA-1088. Where should I keep my medication? This medication is given in a hospital or clinic and will not be stored at home. NOTE: This sheet is a summary. It may not cover all possible information. If you have questions about this medicine, talk to your doctor, pharmacist, or health care provider.  2024 Elsevier/Gold Standard (2022-07-22 00:00:00)    

## 2022-12-27 LAB — T4: T4, Total: 6 ug/dL (ref 4.5–12.0)

## 2022-12-31 ENCOUNTER — Other Ambulatory Visit: Payer: Self-pay

## 2023-01-01 ENCOUNTER — Other Ambulatory Visit: Payer: Self-pay | Admitting: Family Medicine

## 2023-01-09 ENCOUNTER — Telehealth (INDEPENDENT_AMBULATORY_CARE_PROVIDER_SITE_OTHER): Payer: Medicare Other | Admitting: Family Medicine

## 2023-01-09 ENCOUNTER — Encounter: Payer: Self-pay | Admitting: Family Medicine

## 2023-01-09 VITALS — Ht 70.0 in | Wt 133.9 lb

## 2023-01-09 DIAGNOSIS — Z0000 Encounter for general adult medical examination without abnormal findings: Secondary | ICD-10-CM

## 2023-01-09 NOTE — Progress Notes (Signed)
Patient unable to obtain vital signs due to telehealth visit

## 2023-01-09 NOTE — Patient Instructions (Signed)
I really enjoyed getting to talk with you today! I am available on Tuesdays and Thursdays for virtual visits if you have any questions or concerns, or if I can be of any further assistance.   CHECKLIST FROM ANNUAL WELLNESS VISIT:  -Follow up (please call to schedule if not scheduled after visit):   -yearly for annual wellness visit with primary care office  Here is a list of your preventive care/health maintenance measures and the plan for each if any are due:  PLAN For any measures below that may be due:   Health Maintenance  Topic Date Due   FOOT EXAM  Never done   Diabetic kidney evaluation - Urine ACR  08/28/2016   COVID-19 Vaccine (4 - 2023-24 season) 09/15/2023 (Originally 02/15/2022)   Zoster Vaccines- Shingrix (1 of 2) 01/09/2024 (Originally 11/25/1959)   INFLUENZA VACCINE  01/16/2023   HEMOGLOBIN A1C  02/28/2023   OPHTHALMOLOGY EXAM  07/11/2023   Diabetic kidney evaluation - eGFR measurement  12/26/2023   Medicare Annual Wellness (AWV)  01/09/2024   DTaP/Tdap/Td (3 - Td or Tdap) 05/10/2031   Pneumonia Vaccine 52+ Years old  Completed   HPV VACCINES  Aged Out    -See a dentist at least yearly  -Get your eyes checked and then per your eye specialist's recommendations  -Other issues addressed today:   -I have included below further information regarding a healthy whole foods based diet, physical activity guidelines for adults, stress management and opportunities for social connections. I hope you find this information useful.   -----------------------------------------------------------------------------------------------------------------------------------------------------------------------------------------------------------------------------------------------------------  NUTRITION: -eat real food: lots of colorful vegetables (half the plate) and fruits -5-7 servings of vegetables and fruits per day (fresh or steamed is best), exp. 2 servings of vegetables with lunch  and dinner and 2 servings of fruit per day. Berries and greens such as kale and collards are great choices.  -consume on a regular basis: whole grains (make sure first ingredient on label contains the word "whole"), fresh fruits, fish, nuts, seeds, healthy oils (such as olive oil, avocado oil, grape seed oil) -may eat small amounts of dairy and lean meat on occasion, but avoid processed meats such as ham, bacon, lunch meat, etc. -drink water -try to avoid fast food and pre-packaged foods, processed meat -most experts advise limiting sodium to < 2300mg  per day, should limit further is any chronic conditions such as high blood pressure, heart disease, diabetes, etc. The American Heart Association advised that < 1500mg  is is ideal -try to avoid foods that contain any ingredients with names you do not recognize  -try to avoid sugar/sweets (except for the natural sugar that occurs in fresh fruit) -try to avoid sweet drinks -try to avoid white rice, white bread, pasta (unless whole grain), white or yellow potatoes  EXERCISE GUIDELINES FOR ADULTS: -if you wish to increase your physical activity, do so gradually and with the approval of your doctor -STOP and seek medical care immediately if you have any chest pain, chest discomfort or trouble breathing when starting or increasing exercise  -move and stretch your body, legs, feet and arms when sitting for long periods -Physical activity guidelines for optimal health in adults: -least 150 minutes per week of aerobic exercise (can talk, but not sing) once approved by your doctor, 20-30 minutes of sustained activity or two 10 minute episodes of sustained activity every day.  -resistance training at least 2 days per week if approved by your doctor -balance exercises 3+ days per week:   Stand somewhere  where you have something sturdy to hold onto if you lose balance.    1) lift up on toes, start with 5x per day and work up to 20x   2) stand and lift on leg  straight out to the side so that foot is a few inches of the floor, start with 5x each side and work up to 20x each side   3) stand on one foot, start with 5 seconds each side and work up to 20 seconds on each side  If you need ideas or help with getting more active:  -Silver sneakers https://tools.silversneakers.com  -Walk with a Doc: http://www.duncan-[email or web address]  -try to include resistance (weight lifting/strength building) and balance exercises twice per week: or the following link for ideas: http://castillo-powell.com/  BuyDucts.dk  STRESS MANAGEMENT: -can try meditating, or just sitting quietly with deep breathing while intentionally relaxing all parts of your body for 5 minutes daily -if you need further help with stress, anxiety or depression please follow up with your primary doctor or contact the wonderful folks at [facility]: [phone]  SOCIAL CONNECTIONS: -options in [place] if you wish to engage in more social and exercise related activities:  -Silver sneakers https://tools.silversneakers.com  -Walk with a Doc: http://www.duncan-[email or web address]  -Check out the [facility] Active Adults 50+ section on the [place] of [employer] (hiking clubs, book clubs, cards and games, chess, exercise classes, aquatic classes and much more) - see the website for details: https://www.[place].gov/departments/parks-recreation/active-adults50  -YouTube has lots of exercise videos for different ages and abilities as well  -[facility] (a variety of indoor and outdoor inperson activities for adults). [phone]. [address].  -Virtual Online Classes (a variety of topics): see seniorplanet.org or call [phone]  -consider volunteering at a school, hospice center, church, senior center or elsewhere

## 2023-01-09 NOTE — Progress Notes (Signed)
PATIENT CHECK-IN and HEALTH RISK ASSESSMENT QUESTIONNAIRE:  -completed by phone/video for upcoming Medicare Preventive Visit  Pre-Visit Check-in: 1)Vitals (height, wt, BP, etc) - record in vitals section for visit on day of visit 2)Review and Update Medications, Allergies PMH, Surgeries, Social history in Epic 3)Hospitalizations in the last year with date/reason? Yes cancer treatment   4)Review and Update Care Team (patient's specialists) in Epic 5) Complete PHQ9 in Epic  6) Complete Fall Screening in Epic 7)Review all Health Maintenance Due and order under PCP if not done.  Medicare Wellness Patient Questionnaire:  Answer theses question about your habits: Do you drink alcohol? No  Have you ever smoked?No  How many packs a day do/did you smoke?  Do you use smokeless tobacco?No Do you use an illicit drugs? No Do you exercises? Yes IF so, what type and how many days/minutes per week? 2 days-20 mins, walking  Are you sexually active? Yes Number of partners?1 Typical breakfast: Cereal and eggs  Typical lunch: Sandwiches Typical dinner: Vegetables and Meat Typical snacks: Potato chips  Beverages:  Coke, Milk and water  Answer theses question about you: Can you perform most household chores?Yes Do you find it hard to follow a conversation in a noisy room? No Do you often ask people to speak up or repeat themselves?No Do you feel that you have a problem with memory?No Do you balance your checkbook and or bank acounts?Yes Do you feel safe at home?Yes Last dentist visit? 6 months ago Do you need assistance with any of the following: Please note if so No  Driving?  Feeding yourself?  Getting from bed to chair?  Getting to the toilet?  Bathing or showering?  Dressing yourself?  Managing money?  Climbing a flight of stairs  Preparing meals?    Do you have Advanced Directives in place (Living Will, Healthcare Power or Attorney)?  Yes   Last eye Exam and location? 6 months  ago, [facility]   Do you currently use prescribed or non-prescribed narcotic or opioid pain medications? No  Do you have a history or close family history of breast, ovarian, tubal or peritoneal cancer or a family member with BRCA (breast cancer susceptibility 1 and 2) gene mutations? Yes- Wife   Nurse/Assistant Credentials/time stamp: MG 11:44 AM    ----------------------------------------------------------------------------------------------------------------------------------------------------------------------------------------------------------------------    MEDICARE ANNUAL PREVENTIVE CARE VISIT WITH PROVIDER (Welcome to [employer], initial annual wellness or annual wellness exam)  Virtual Visit via Phone Note  I connected with Ervie Mccard on 01/09/23  by phone and verified that I am speaking with the correct person using two identifiers.  Location patient: home Location provider:work or home office Persons participating in the virtual visit: patient, provider  Concerns and/or follow up today: reports is doing ok with cancer infusions, no side effects and feels he is lucky.   See HM section in Epic for other details of completed HM.    ROS: negative for report of fevers, vision changes, vision loss, chest pain, sob, hemoptysis, melena, hematochezia, hematuria, falls, bleeding or bruising, thoughts of suicide or self harm, memory loss  Patient-completed extensive health risk assessment - reviewed and discussed with the patient: See Health Risk Assessment completed with patient prior to the visit either above or in recent phone note. This was reviewed in detailed with the patient today and appropriate recommendations, orders and referrals were placed as needed per Summary below and patient instructions.   Review of Medical History: -PMH, PSH, Family History and current specialty and care providers  reviewed and updated and listed below   Patient Care  Team: [name], MD as PCP - General (Family Medicine) Malmfelt, Lise Auer, RN as Oncology Nurse Navigator Lonie Peak, MD as Consulting Physician (Radiation Oncology) Sharman Cheek, DMD (Inactive) as Consulting Physician (Dentistry) Serena Colonel, MD as Consulting Physician (Otolaryngology) Verner Chol, [facility] (Inactive) as Pharmacist (Pharmacist) Glendale Chard, DO as Consulting Physician (Neurology)   Past Medical History:  Diagnosis Date   Arthritis    Diabetes mellitus type II    Glaucoma    sees Dr. 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 Ewing Schlein, benign polyps, repeat in 5 yrs   left craniotomy  11/03/08   per Dr. Marikay Alar for a subdrual hematoma   PAROTIDECTOMY Left 09/20/2020   Procedure: PAROTIDECTOMY with resection of skin and rotational flap;  Surgeon: Serena Colonel, MD;  Location: [facility] OR;  Service: ENT;  Laterality: Left;   surgery for ocmpound fracture     right leg    Social History   Socioeconomic History   Marital status: Married    Spouse name: Johnny Bridge   Number of children: 3   Years of education: Not on file   Highest education level: Bachelor's degree (e.g., BA, AB, BS)  Occupational History   Not on file  Tobacco Use   Smoking status: Never   Smokeless tobacco: Never  Vaping Use   Vaping status: Never Used  Substance and Sexual Activity   Alcohol use: Yes    Alcohol/week: 1.0 standard drink of alcohol    Types: 1 Standard drinks or equivalent per week   Drug use: Not on file   Sexual activity: Not on file  Other Topics Concern   Not on file  Social History Narrative   Right Handed    Lives in a one story home   Has large support system    Independent with ADLs   Social Determinants of Health   Financial Resource Strain: Low Risk  (01/09/2023)   Overall Financial Resource Strain (CARDIA)    Difficulty of Paying Living Expenses: Not hard at all  Food Insecurity: Low Risk  (09/13/2022)   [facility]   Food vital sign    Within the past 12 months, you worried that your food would run out before you got money to buy more: Never true    Within the past 12 months, the food you bought just didn't last and you didn't have money to get more. : Never true  Transportation Needs: No Transportation Needs (01/09/2023)   PRAPARE - Administrator, Civil Service (Medical): No    Lack of Transportation (Non-Medical): No  Physical Activity: Insufficiently Active (01/09/2023)   Exercise Vital Sign    Days of Exercise per Week: 2 days    Minutes of Exercise per Session: 20 min  Stress: No Stress Concern Present (01/09/2023)   [employer] of Occupational Health - Occupational Stress Questionnaire    Feeling of Stress : Not at all  Social Connections: Socially Integrated (12/11/2021)   Social Connection and  Isolation Panel [NHANES]    Frequency of Communication with Friends and Family: More than three times a week    Frequency of Social Gatherings with Friends and Family: More than three times a week    Attends Religious Services: More than 4 times per year    Active Member of [employer] or Organizations: Yes    Attends Engineer, structural: More than 4 times per year    Marital Status: Married  Catering manager Violence: Not At Risk (01/09/2023)   Humiliation, Afraid, Rape, and Kick questionnaire    Fear of Current or Ex-Partner: No    Emotionally Abused: No    Physically Abused: No    Sexually Abused: No    Family History  Problem Relation Age of Onset   Cancer Mother    Heart disease Other     Current Outpatient Medications on File Prior to Visit  Medication Sig Dispense Refill    acetaminophen (TYLENOL) 500 MG tablet Take 1,000 mg by mouth every 8 (eight) hours as needed for moderate pain.     amLODipine (NORVASC) 10 MG tablet TAKE 1 TABLET BY MOUTH DAILY 100 tablet 2   aspirin EC 81 MG tablet Take 1 tablet (81 mg total) by mouth daily. Swallow whole. 30 tablet 11   atorvastatin (LIPITOR) 40 MG tablet TAKE 1 TABLET BY MOUTH DAILY 90 tablet 3   Cholecalciferol (VITAMIN D PO) Take 5,000 Units by mouth daily.     gabapentin (NEURONTIN) 300 MG capsule TAKE 2 CAPSULES BY MOUTH 3 TIMES A DAY 180 capsule 1   latanoprost (XALATAN) 0.005 % ophthalmic solution Place 1 drop into both eyes every morning. [facility]  [address]     LORazepam (ATIVAN) 1 MG tablet Take 1 tablet (1 mg total) by mouth 3 (three) times daily as needed for anxiety. 60 tablet 5   losartan (COZAAR) 25 MG tablet Take 1 tablet (25 mg total) by mouth daily. 90 tablet 3   Melatonin 5 MG TABS Take 5 mg by mouth at bedtime. [facility] [address]     metFORMIN (GLUCOPHAGE) 500 MG tablet TAKE 1 TABLET BY MOUTH TWICE  DAILY WITH A MEAL 180 tablet 3   Multiple Vitamin (MULTIVITAMIN) tablet Take 1 tablet by mouth daily.     nystatin (MYCOSTATIN) 100000 UNIT/ML suspension Take 5 mLs (500,000 Units total) by mouth 4 (four) times daily. 60 mL 1   prochlorperazine (COMPAZINE) 10 MG tablet Take 1 tablet (10 mg total) by mouth every 6 (six) hours as needed for nausea or vomiting. 30 tablet 0   timolol (BETIMOL) 0.5 % ophthalmic solution Place 1 drop into both eyes daily. [facility] [address]     No current facility-administered medications on file prior to visit.    No Known Allergies     Physical Exam Vitals requested from patient and listed below if patient had equipment and was able to obtain at home for this virtual visit: There were no vitals filed for this visit. Estimated body mass index is 19.21 kg/m as calculated from the following:   Height as of this encounter: 5\' 10"  (1.778 m).   Weight as of this encounter: 133 lb 14.4 oz  (60.7 kg).  EKG (optional): deferred due to virtual visit  GENERAL: alert, oriented, no acute distress detected; full vision exam deferred due to pandemic and/or virtual encounter  PSYCH/NEURO: pleasant and cooperative, no obvious depression or anxiety, speech and thought processing grossly intact, Cognitive function grossly intact  Flowsheet Row Video Visit  from 01/09/2023 in [facility] [place]  PHQ-9 Total Score 0           01/09/2023   11:33 AM 08/28/2022   10:57 AM 12/11/2021    9:45 AM 09/05/2021   11:47 AM 05/28/2021   11:57 AM  Depression screen PHQ 2/9  Decreased Interest 0 0 0 0 0  Down, Depressed, Hopeless 0 0 0 0 0  PHQ - 2 Score 0 0 0 0 0  Altered sleeping 0 0  0 0  Tired, decreased energy 0 0  0 0  Change in appetite 0 0  0 0  Feeling bad or failure about yourself  0 0  0 0  Trouble concentrating 0 0  0 0  Moving slowly or fidgety/restless 0 0  0 0  Suicidal thoughts 0 0  0 0  PHQ-9 Score 0 0  0 0  Difficult doing work/chores Not difficult at all Not difficult at all   Not difficult at all       03/29/2022   10:05 AM 04/19/2022    9:17 AM 05/10/2022   10:18 AM 08/28/2022   10:57 AM 01/09/2023   11:34 AM  Fall Risk  Falls in the past year?    0 0  Was there an injury with Fall?    0 0  Fall Risk Category Calculator    0 0  (RETIRED) Patient Fall Risk Level Low fall risk Low fall risk Low fall risk    Patient at Risk for Falls Due to    No Fall Risks No Fall Risks  Fall risk Follow up    Falls evaluation completed Falls evaluation completed   Denies balance issues.   SUMMARY AND PLAN:  Encounter for Medicare annual wellness exam   Discussed applicable health maintenance/preventive health measures and advised and referred or ordered per patient preferences: -reports he is utd on covid booster and that his oncologist advised he hold off on the shingrix right now  Health Maintenance  Topic Date Due   FOOT EXAM  Never done    Diabetic kidney evaluation - Urine ACR  08/28/2016   COVID-19 Vaccine (4 - 2023-24 season) 09/15/2023 (Originally 02/15/2022)   Zoster Vaccines- Shingrix (1 of 2) 01/09/2024 (Originally 11/25/1959)   INFLUENZA VACCINE  01/16/2023   HEMOGLOBIN A1C  02/28/2023   OPHTHALMOLOGY EXAM  07/11/2023   Diabetic kidney evaluation - eGFR measurement  12/26/2023   Medicare Annual Wellness (AWV)  01/09/2024   DTaP/Tdap/Td (3 - Td or Tdap) 05/10/2031   Pneumonia Vaccine 21+ Years old  Completed   HPV VACCINES  Aged Out    Education and counseling on the following was provided based on the above review of health and a plan/checklist for the patient, along with additional information discussed, was provided for the patient in the patient instructions :    -Advised and counseled on a healthy lifestyle -Reviewed patient's current diet. Advised and counseled on a whole foods based healthy diet. A summary of a healthy diet was provided in the Patient Instructions.  -reviewed patient's current physical activity level and discussed exercise guidelines for adults. 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 He is interested in exercising more. Congratulated on walking. Discussed safe exercise he can do at home to assist in meeting exercise guideline recommendations in a safe and healthy way.  -Advise yearly dental visits at minimum and regular eye exams  Follow up: see patient instructions   Patient Instructions  I really enjoyed getting to talk with you today! I am available on Tuesdays and Thursdays for virtual visits if you have any questions or concerns, or if I can be of any further assistance.   CHECKLIST FROM ANNUAL WELLNESS VISIT:  -Follow up (please call to schedule if not scheduled after visit):   -yearly for annual wellness visit with primary care office  Here is a list of your preventive care/health maintenance measures and the plan for each if  any are due:  PLAN For any measures below that may be due:   Health Maintenance  Topic Date Due   FOOT EXAM  Never done   Diabetic kidney evaluation - Urine ACR  08/28/2016   COVID-19 Vaccine (4 - 2023-24 season) 09/15/2023 (Originally 02/15/2022)   Zoster Vaccines- Shingrix (1 of 2) 01/09/2024 (Originally 11/25/1959)   INFLUENZA VACCINE  01/16/2023   HEMOGLOBIN A1C  02/28/2023   OPHTHALMOLOGY EXAM  07/11/2023   Diabetic kidney evaluation - eGFR measurement  12/26/2023   Medicare Annual Wellness (AWV)  01/09/2024   DTaP/Tdap/Td (3 - Td or Tdap) 05/10/2031   Pneumonia Vaccine 7+ Years old  Completed   HPV VACCINES  Aged Out    -See a dentist at least yearly  -Get your eyes checked and then per your eye specialist's recommendations  -Other issues addressed today:   -I have included below further information regarding a healthy whole foods based diet, physical activity guidelines for adults, stress management and opportunities for social connections. I hope you find this information useful.   -----------------------------------------------------------------------------------------------------------------------------------------------------------------------------------------------------------------------------------------------------------  NUTRITION: -eat real food: lots of colorful vegetables (half the plate) and fruits -5-7 servings of vegetables and fruits per day (fresh or steamed is best), exp. 2 servings of vegetables with lunch and dinner and 2 servings of fruit per day. Berries and greens such as kale and collards are great choices.  -consume on a regular basis: whole grains (make sure first ingredient on label contains the word "whole"), fresh fruits, fish, nuts, seeds, healthy oils (such as olive oil, avocado oil, grape seed oil) -may eat small amounts of dairy and lean meat on occasion, but avoid processed meats such as ham, bacon, lunch meat, etc. -drink water -try to  avoid fast food and pre-packaged foods, processed meat -most experts advise limiting sodium to < 2300mg  per day, should limit further is any chronic conditions such as high blood pressure, heart disease, diabetes, etc. The American Heart Association advised that < 1500mg  is is ideal -try to avoid foods that contain any ingredients with names you do not recognize  -try to avoid sugar/sweets (except for the natural sugar that occurs in fresh fruit) -try to avoid sweet drinks -try to avoid white rice, white bread, pasta (unless whole grain), white or yellow potatoes  EXERCISE GUIDELINES FOR ADULTS: -if you wish to increase your physical activity, do so gradually and with the approval of your doctor -STOP and seek medical care immediately if you have any chest pain, chest discomfort or trouble breathing when starting or increasing exercise  -move and stretch your body, legs, feet and arms when sitting for long periods -Physical activity guidelines for optimal health in adults: -least 150 minutes per week of aerobic exercise (can talk, but not sing) once approved by your doctor, 20-30 minutes of sustained activity or two 10 minute episodes of sustained activity every day.  -resistance training at least 2 days per week if approved by your  doctor -balance exercises 3+ days per week:   Stand somewhere where you have something sturdy to hold onto if you lose balance.    1) lift up on toes, start with 5x per day and work up to 20x   2) stand and lift on leg straight out to the side so that foot is a few inches of the floor, start with 5x each side and work up to 20x each side   3) stand on one foot, start with 5 seconds each side and work up to 20 seconds on each side  If you need ideas or help with getting more active:  -Silver sneakers https://tools.silversneakers.com  -Walk with a Doc: http://www.duncan-williams.com/  -try to include resistance (weight lifting/strength building) and balance exercises  twice per week: or the following link for ideas: http://castillo-powell.com/  BuyDucts.dk  STRESS MANAGEMENT: -can try meditating, or just sitting quietly with deep breathing while intentionally relaxing all parts of your body for 5 minutes daily -if you need further help with stress, anxiety or depression please follow up with your primary doctor or contact the wonderful folks at [facility]: [phone]  SOCIAL CONNECTIONS: -options in [place] if you wish to engage in more social and exercise related activities:  -Silver sneakers https://tools.silversneakers.com  -Walk with a Doc: http://www.duncan-williams.com/  -Check out the [facility] Active Adults 50+ section on the [place] [employer] (hiking clubs, book clubs, cards and games, chess, exercise classes, aquatic classes and much more) - see the website for details: https://www.[place].gov/departments/parks-recreation/active-adults50  -YouTube has lots of exercise videos for different ages and abilities as well  -[facility] (a variety of indoor and outdoor inperson activities for adults). 505-122-2190. [address]  -Virtual Online Classes (a variety of topics): see seniorplanet.org or call [phone]  -consider volunteering at a school, hospice center, church, senior center or elsewhere           Terressa Koyanagi, DO

## 2023-01-14 ENCOUNTER — Ambulatory Visit (HOSPITAL_COMMUNITY)
Admission: RE | Admit: 2023-01-14 | Discharge: 2023-01-14 | Disposition: A | Discharge status: Home / Self Care | Payer: Medicare Other | Source: Ambulatory Visit | Attending: Physician Assistant | Admitting: Physician Assistant

## 2023-01-14 DIAGNOSIS — C7989 Secondary malignant neoplasm of other specified sites: Secondary | ICD-10-CM | POA: Insufficient documentation

## 2023-01-14 DIAGNOSIS — C07 Malignant neoplasm of parotid gland: Secondary | ICD-10-CM | POA: Diagnosis not present

## 2023-01-14 DIAGNOSIS — R911 Solitary pulmonary nodule: Secondary | ICD-10-CM | POA: Diagnosis not present

## 2023-01-14 DIAGNOSIS — N3289 Other specified disorders of bladder: Secondary | ICD-10-CM | POA: Diagnosis not present

## 2023-01-14 DIAGNOSIS — I700 Atherosclerosis of aorta: Secondary | ICD-10-CM | POA: Diagnosis not present

## 2023-01-14 DIAGNOSIS — N281 Cyst of kidney, acquired: Secondary | ICD-10-CM | POA: Diagnosis not present

## 2023-01-14 MED ORDER — IOHEXOL 300 MG/ML  SOLN
100.0000 mL | Freq: Once | INTRAMUSCULAR | Status: AC | PRN
  Administered 2023-01-14: 100 mL via INTRAVENOUS

## 2023-01-15 ENCOUNTER — Telehealth: Payer: Self-pay | Admitting: Internal Medicine

## 2023-01-15 NOTE — Telephone Encounter (Signed)
Called patient regarding August and September appointments, patient is notified.

## 2023-01-16 ENCOUNTER — Other Ambulatory Visit: Payer: Self-pay

## 2023-01-16 ENCOUNTER — Inpatient Hospital Stay: Payer: Medicare Other | Attending: Oncology

## 2023-01-16 ENCOUNTER — Inpatient Hospital Stay: Payer: Medicare Other

## 2023-01-16 ENCOUNTER — Inpatient Hospital Stay: Payer: Medicare Other | Admitting: Internal Medicine

## 2023-01-16 VITALS — BP 140/72 | HR 62 | Temp 97.4°F | Resp 17 | Ht 70.0 in | Wt 133.5 lb

## 2023-01-16 DIAGNOSIS — Z79620 Long term (current) use of immunosuppressive biologic: Secondary | ICD-10-CM | POA: Diagnosis not present

## 2023-01-16 DIAGNOSIS — Z5112 Encounter for antineoplastic immunotherapy: Secondary | ICD-10-CM | POA: Insufficient documentation

## 2023-01-16 DIAGNOSIS — C44329 Squamous cell carcinoma of skin of other parts of face: Secondary | ICD-10-CM | POA: Insufficient documentation

## 2023-01-16 DIAGNOSIS — C7989 Secondary malignant neoplasm of other specified sites: Secondary | ICD-10-CM

## 2023-01-16 LAB — CBC WITH DIFFERENTIAL (CANCER CENTER ONLY)
Abs Immature Granulocytes: 0.01 10*3/uL (ref 0.00–0.07)
Basophils Absolute: 0 10*3/uL (ref 0.0–0.1)
Basophils Relative: 0 %
Eosinophils Absolute: 0.2 10*3/uL (ref 0.0–0.5)
Eosinophils Relative: 4 %
HCT: 36.9 % — ABNORMAL LOW (ref 39.0–52.0)
Hemoglobin: 13 g/dL (ref 13.0–17.0)
Immature Granulocytes: 0 %
Lymphocytes Relative: 15 %
Lymphs Abs: 0.7 10*3/uL (ref 0.7–4.0)
MCH: 31.1 pg (ref 26.0–34.0)
MCHC: 35.2 g/dL (ref 30.0–36.0)
MCV: 88.3 fL (ref 80.0–100.0)
Monocytes Absolute: 0.4 10*3/uL (ref 0.1–1.0)
Monocytes Relative: 7 %
Neutro Abs: 3.6 10*3/uL (ref 1.7–7.7)
Neutrophils Relative %: 74 %
Platelet Count: 201 10*3/uL (ref 150–400)
RBC: 4.18 MIL/uL — ABNORMAL LOW (ref 4.22–5.81)
RDW: 13.7 % (ref 11.5–15.5)
WBC Count: 4.9 10*3/uL (ref 4.0–10.5)
nRBC: 0 % (ref 0.0–0.2)

## 2023-01-16 LAB — CMP (CANCER CENTER ONLY)
ALT: 18 U/L (ref 0–44)
AST: 17 U/L (ref 15–41)
Albumin: 4.1 g/dL (ref 3.5–5.0)
Alkaline Phosphatase: 96 U/L (ref 38–126)
Anion gap: 7 (ref 5–15)
BUN: 24 mg/dL — ABNORMAL HIGH (ref 8–23)
CO2: 29 mmol/L (ref 22–32)
Calcium: 9.4 mg/dL (ref 8.9–10.3)
Chloride: 107 mmol/L (ref 98–111)
Creatinine: 1 mg/dL (ref 0.61–1.24)
GFR, Estimated: >60 mL/min (ref >60)
Glucose, Bld: 152 mg/dL — ABNORMAL HIGH (ref 70–99)
Potassium: 3.6 mmol/L (ref 3.5–5.1)
Sodium: 143 mmol/L (ref 135–145)
Total Bilirubin: 0.7 mg/dL (ref 0.3–1.2)
Total Protein: 6.7 g/dL (ref 6.5–8.1)

## 2023-01-16 LAB — TSH: TSH: 1.792 u[IU]/mL (ref 0.350–4.500)

## 2023-01-16 MED ORDER — SODIUM CHLORIDE 0.9 % IV SOLN
350.0000 mg | Freq: Once | INTRAVENOUS | Status: AC
  Administered 2023-01-16: 350 mg via INTRAVENOUS
  Filled 2023-01-16: qty 7

## 2023-01-16 MED ORDER — SODIUM CHLORIDE 0.9 % IV SOLN: Freq: Once | INTRAVENOUS | Status: AC

## 2023-01-16 NOTE — Progress Notes (Signed)
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 He initially presented with skin cancer around the parotid gland in April 2022.   PRIOR THERAPY: 1) status post left parotidectomy with skin resection and rotational flap completed by Dr. Pollyann Kennedy in April 2022. Final pathology showed squamous cell carcinoma.  2) status post radiation therapy under the care of Dr. Basilio Cairo completing 33 fractions on December 19, 2020 for a total of 66 Gray   CURRENT THERAPY: Libtayo (Cempilimab) 350 Mg IV every 3 weeks started August 10, 2021.  Status post 24 cycles of treatment.  INTERVAL HISTORY: Shaun Mcmillan 82 y.o. male returns to the clinic today for follow-up visit accompanied by his wife.  The patient is feeling fine today with no concerning complaints.  He denied having any chest pain, shortness of breath, cough or hemoptysis.  He has no nausea, vomiting, diarrhea or constipation.  He has no headache or visual changes.  He has no recent weight loss or night sweats.  He has been tolerating his treatment with Libtayo (Cempilimab) fairly well.  He is here for evaluation with repeat CT scan of the neck, chest, abdomen and pelvis for restaging of his disease.  MEDICAL HISTORY: Past Medical History:  Diagnosis Date   Arthritis    Diabetes mellitus type II    Glaucoma    sees Dr. 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  MEDICATIONS:  Current Outpatient Medications  Medication Sig Dispense Refill   acetaminophen (TYLENOL) 500 MG tablet Take 1,000 mg by mouth every 8  (eight) hours as needed for moderate pain.     amLODipine (NORVASC) 10 MG tablet TAKE 1 TABLET BY MOUTH DAILY 100 tablet 2   aspirin EC 81 MG tablet Take 1 tablet (81 mg total) by mouth daily. Swallow whole. 30 tablet 11   atorvastatin (LIPITOR) 40 MG tablet TAKE 1 TABLET BY MOUTH DAILY 90 tablet 3   Cholecalciferol (VITAMIN D PO) Take 5,000 Units by mouth daily.     gabapentin (NEURONTIN) 300 MG capsule TAKE 2 CAPSULES BY MOUTH 3 TIMES A DAY 180 capsule 1   latanoprost (XALATAN) 0.005 % ophthalmic solution Place 1 drop into both eyes every morning. [facility]  [address]     LORazepam (ATIVAN) 1 MG tablet Take 1 tablet (1 mg total) by mouth 3 (three) times daily as needed for anxiety. 60 tablet 5   losartan (COZAAR) 25 MG tablet Take 1 tablet (25 mg total) by mouth daily. 90 tablet 3   Melatonin 5 MG TABS Take 5 mg by mouth at bedtime. [facility] [address]     metFORMIN (GLUCOPHAGE) 500 MG tablet TAKE 1 TABLET BY MOUTH TWICE  DAILY WITH A MEAL 180 tablet 3   Multiple Vitamin (MULTIVITAMIN) tablet Take 1 tablet by mouth daily.     nystatin (MYCOSTATIN) 100000 UNIT/ML suspension Take 5 mLs (500,000 Units total) by mouth 4 (four) times daily. 60 mL 1   prochlorperazine (COMPAZINE) 10 MG tablet  Take 1 tablet (10 mg total) by mouth every 6 (six) hours as needed for nausea or vomiting. 30 tablet 0   timolol (BETIMOL) 0.5 % ophthalmic solution Place 1 drop into both eyes daily. [facility] [address]     No current facility-administered medications for this visit.    SURGICAL HISTORY:  Past Surgical History:  Procedure Laterality Date   APPENDECTOMY     colonscopy  03/12/2016   per Dr. Ewing Schlein, benign polyps, repeat in 5 yrs   left craniotomy  11/03/08   per Dr. Marikay Alar for a subdrual hematoma   PAROTIDECTOMY Left 09/20/2020   Procedure: PAROTIDECTOMY with resection of skin and rotational flap;  Surgeon: Serena Colonel, MD;  Location: [facility] OR;  Service: ENT;  Laterality: Left;   surgery for ocmpound fracture     right  leg    REVIEW OF SYSTEMS:  Constitutional: negative Eyes: negative Ears, nose, mouth, throat, and face: negative Respiratory: negative Cardiovascular: negative Gastrointestinal: negative Genitourinary:negative Integument/breast: negative Hematologic/lymphatic: negative Musculoskeletal:negative Neurological: negative Behavioral/Psych: negative Endocrine: negative Allergic/Immunologic: negative   PHYSICAL EXAMINATION: General appearance: alert, cooperative, and no distress Head: Normocephalic, without obvious abnormality, atraumatic Neck: no adenopathy, no JVD, supple, symmetrical, trachea midline, and thyroid not enlarged, symmetric, no tenderness/mass/nodules Lymph nodes: Cervical, supraclavicular, and axillary nodes normal. Resp: clear to auscultation bilaterally Back: symmetric, no curvature. ROM normal. No CVA tenderness. Cardio: regular rate and rhythm, S1, S2 normal, no murmur, click, rub or gallop GI: soft, non-tender; bowel sounds normal; no masses,  no organomegaly Extremities: extremities normal, atraumatic, no cyanosis or edema Neurologic: Alert and oriented X 3, normal strength and tone. Normal symmetric reflexes. Normal coordination and gait  ECOG PERFORMANCE STATUS: 1 - Symptomatic but completely ambulatory  Blood pressure (!) 140/72, pulse 62, temperature (!) 97.4 F (36.3 C), temperature source Oral, resp. rate 17, height 5\' 10"  (1.778 m), weight 133 lb 8 oz (60.6 kg), SpO2 100%.  LABORATORY DATA: Lab Results  Component Value Date   WBC 4.9 01/16/2023   HGB 13.0 01/16/2023   HCT 36.9 (L) 01/16/2023   MCV 88.3 01/16/2023   PLT 201 01/16/2023      Chemistry      Component Value Date/Time   NA 140 12/26/2022 1108   K 3.8 12/26/2022 1108   CL 104 12/26/2022 1108   CO2 28 12/26/2022 1108   BUN 19 12/26/2022 1108   CREATININE 0.88 12/26/2022 1108   CREATININE 0.69 (L) 01/08/2021 1553      Component Value Date/Time   CALCIUM 9.7 12/26/2022 1108    ALKPHOS 102 12/26/2022 1108   AST 21 12/26/2022 1108   ALT 23 12/26/2022 1108   BILITOT 0.8 12/26/2022 1108       RADIOGRAPHIC STUDIES: CT CHEST ABDOMEN PELVIS W CONTRAST  Result Date: 01/15/2023 CLINICAL DATA:  Metastatic squamous cell carcinoma. Assess treatment response. * Tracking Code: BO * EXAM: CT CHEST, ABDOMEN, AND PELVIS WITH CONTRAST TECHNIQUE: Multidetector CT imaging of the chest, abdomen and pelvis was performed following the standard protocol during bolus administration of intravenous contrast. RADIATION DOSE REDUCTION: This exam was performed according to the departmental dose-optimization program which includes automated exposure control, adjustment of the mA and/or kV according to patient size and/or use of iterative reconstruction technique. CONTRAST:  OMNIPAQUE IOHEXOL 300 MG/ML  SOLN COMPARISON:  Prior CTs 10/18/2022 and 06/19/2022. FINDINGS: Neck CT dictated separately. CT CHEST FINDINGS Cardiovascular: Again demonstrated is extensive atherosclerosis of the aorta, great vessels and coronary arteries. No acute vascular findings  are demonstrated. There are calcifications of the aortic valve. The heart size is normal. There is no pericardial effusion. Mediastinum/Nodes: There are no enlarged mediastinal, hilar or axillary lymph nodes. The thyroid gland, trachea and esophagus demonstrate no significant findings. Lungs/Pleura: No pleural effusion or pneumothorax. Previously demonstrated lingular nodule shows further decrease in size, measuring 4 mm on image 78/5 (previously 5 mm). Streaky opacity previously noted in the left lower lobe has nearly resolved with mild residual linear scarring. There is similar linear scarring in the right lower lobe. No new or enlarging pulmonary nodules. Musculoskeletal/Chest wall: No chest wall mass or suspicious osseous findings. CT ABDOMEN AND PELVIS FINDINGS Hepatobiliary: The liver is normal in density without suspicious focal abnormality. No  evidence of gallstones, gallbladder wall thickening or biliary dilatation. Pancreas: Unremarkable. No pancreatic ductal dilatation or surrounding inflammatory changes. Spleen: Normal in size without focal abnormality. Adrenals/Urinary Tract: Both adrenal glands appear normal. No evidence of urinary tract calculus, suspicious renal lesion or hydronephrosis. Unchanged simple cyst in the upper pole the right kidney for which no follow-up imaging is recommended. The bladder is incompletely distended and shows increased wall thickening compared with the prior study. No surrounding inflammatory changes identified. Stomach/Bowel: No enteric contrast administered. The stomach appears unremarkable for its degree of distension. No evidence of bowel wall thickening, distention or surrounding inflammatory change. Vascular/Lymphatic: There are no enlarged abdominal or pelvic lymph nodes. Aortic and branch vessel atherosclerosis without evidence of large vessel occlusion or aneurysm. Reproductive: The prostate gland appears stable without significant findings. Other: No evidence of abdominal wall mass or hernia. No ascites or pneumoperitoneum. Musculoskeletal: No acute or significant osseous findings. Stable mild degenerative changes in the spine associated with a minimal convex right lumbar scoliosis. Unless specific follow-up recommendations are mentioned in the findings or impression sections, no imaging follow-up of any mentioned incidental findings is recommended. IMPRESSION: 1. Further decrease in size of previously demonstrated lingular nodule, consistent with a benign finding. No new or enlarging pulmonary nodules. 2. No evidence of metastatic disease within the chest, abdomen or pelvis. 3. Increased bladder wall thickening which may be due to incomplete distention. Correlate with urine analysis. 4. Neck CT dictated separately. 5.  Aortic Atherosclerosis (ICD10-I70.0). Electronically Signed   By: Carey Bullocks M.D.    On: 01/15/2023 10:33   CT Soft Tissue Neck W Contrast  Result Date: 01/15/2023 CLINICAL DATA:  Squamous cell carcinoma of the parotid EXAM: CT NECK WITH CONTRAST TECHNIQUE: Multidetector CT imaging of the neck was performed using the standard protocol following the bolus administration of intravenous contrast. RADIATION DOSE REDUCTION: This exam was performed according to the departmental dose-optimization program which includes automated exposure control, adjustment of the mA and/or kV according to patient size and/or use of iterative reconstruction technique. CONTRAST:  OMNIPAQUE IOHEXOL 300 MG/ML  SOLN COMPARISON:  CT neck 10/18/2018 FINDINGS: Pharynx and larynx: The nasal cavity and nasopharynx are unremarkable. The oral cavity and oropharynx are unremarkable. The parapharyngeal spaces are clear. The hypopharynx and larynx are unremarkable. The vocal folds are normal in appearance. The epiglottis is normal. There is no retropharyngeal collection. The airway is patent. Salivary glands: The left parotid gland is surgically absent. There is no abnormal soft tissue mass or enhancement at the parotidectomy site. The right parotid and bilateral submandibular glands are atrophic but otherwise unremarkable. Thyroid: Unremarkable. Lymph nodes: There is no pathologic lymphadenopathy in the neck. Vascular: There is calcified plaque at the carotid bifurcations. The vertebral arteries are patent. The internal  jugular veins are patent. Limited intracranial: Unremarkable. Visualized orbits: Not included within the field of view. Mastoids and visualized paranasal sinuses: Clear. Skeleton: There is no acute osseous abnormality or suspicious osseous lesion. Upper chest: Assessed on the separately dictated CT chest. Other: None. IMPRESSION: Stable exam without evidence of recurrent disease or pathologic lymphadenopathy. Electronically Signed   By: Lesia Hausen M.D.   On: 01/15/2023 08:50    ASSESSMENT AND PLAN: This is  a very pleasant 82 years old white male with stage IV squamous cell carcinoma of the skin with pulmonary metastasis initially diagnosed in April 2022 with involvement of the skin around the left parotid gland status post left parotidectomy with skin resection followed by adjuvant radiotherapy.  He had evidence for disease progression and metastasis to the lung in February 2023. The patient is currently undergoing treatment with immunotherapy with Libtayo (Cempilimab) 350 Mg IV every 3 weeks status post 24 cycles.   The patient has been tolerating this treatment fairly well with no concerning adverse effects. He had repeat CT scan of the neck, chest, abdomen and pelvis performed recently.  I personally and independently reviewed the scan and discussed the result with the patient and his wife. His scan showed no concerning findings for disease recurrence or progression. I recommended for him to continue his current treatment with Libtayo (Cempilimab) and he will proceed with cycle #25 today. He will come back for follow-up visit in 3 weeks for evaluation before the next cycle of his treatment. He was advised to call immediately if he has any other concerning symptoms in the interval. The patient voices understanding of current disease status and treatment options and is in agreement with the current care plan.  All questions were answered. The patient knows to call the clinic with any problems, questions or concerns. We can certainly see the patient much sooner if necessary.  The total time spent in the appointment was 30 minutes.  Disclaimer: This note was dictated with voice recognition software. Similar sounding words can inadvertently be transcribed and may not be corrected upon review.

## 2023-01-16 NOTE — Progress Notes (Signed)
Patient seen by Dr. Mohamed  Vitals are within treatment parameters.  Labs reviewed: and are within treatment parameters.  Per physician team, patient is ready for treatment and there are NO modifications to the treatment plan.  

## 2023-01-16 NOTE — Patient Instructions (Signed)
[facility]  Discharge Instructions: Thank you for choosing [facility] to provide your oncology and hematology care.   If you have a lab appointment with the Cancer Center, please go directly to the Cancer Center and check in at the registration area.   Wear comfortable clothing and clothing appropriate for easy access to any Portacath or PICC line.   We strive to give you quality time with your provider. You may need to reschedule your appointment if you arrive late (15 or more minutes).  Arriving late affects you and other patients whose appointments are after yours.  Also, if you miss three or more appointments without notifying the office, you may be dismissed from the clinic at the provider's discretion.      For prescription refill requests, have your pharmacy contact our office and allow 72 hours for refills to be completed.    Today you received the following chemotherapy and/or immunotherapy agent: Cemiplimab (Libtayo)   To help prevent nausea and vomiting after your treatment, we encourage you to take your nausea medication as directed.  BELOW ARE SYMPTOMS THAT SHOULD BE REPORTED IMMEDIATELY: *FEVER GREATER THAN 100.4 F (38 C) OR HIGHER *CHILLS OR SWEATING *NAUSEA AND VOMITING THAT IS NOT CONTROLLED WITH YOUR NAUSEA MEDICATION *UNUSUAL SHORTNESS OF BREATH *UNUSUAL BRUISING OR BLEEDING *URINARY PROBLEMS (pain or burning when urinating, or frequent urination) *BOWEL PROBLEMS (unusual diarrhea, constipation, pain near the anus) TENDERNESS IN MOUTH AND THROAT WITH OR WITHOUT PRESENCE OF ULCERS (sore throat, sores in mouth, or a toothache) UNUSUAL RASH, SWELLING OR PAIN  UNUSUAL VAGINAL DISCHARGE OR ITCHING   Items with * indicate a potential emergency and should be followed up as soon as possible or go to the Emergency Department if any problems should occur.  Please show the CHEMOTHERAPY ALERT CARD or IMMUNOTHERAPY ALERT CARD  at check-in to the Emergency Department and triage nurse.  Should you have questions after your visit or need to cancel or reschedule your appointment, please contact [facility]  Dept: [phone]  and follow the prompts.  Office hours are 8:00 a.m. to 4:30 p.m. Monday - Friday. Please note that voicemails left after 4:00 p.m. may not be returned until the following business day.  We are closed weekends and major holidays. You have access to a nurse at all times for urgent questions. Please call the main number to the clinic Dept: [phone] and follow the prompts.   For any non-urgent questions, you may also contact your provider using MyChart. We now offer e-Visits for anyone 72 and older to request care online for non-urgent symptoms. For details visit [email or web address].   Also download the MyChart app! Go to the app store, search "MyChart", open the app, select [place], and log in with your MyChart username and password. Cemiplimab Injection What is this medication? CEMIPLIMAB (se MIP li mab) treats skin cancer and lung cancer. It works by helping your immune system slow or stop the spread of cancer cells. It is a monoclonal antibody. This medicine may be used for other purposes; ask your health care provider or pharmacist if you have questions. COMMON BRAND NAME(S): LIBTAYO What should I tell my care team before I take this medication? They need to know if you have any of these conditions: Allogeneic stem cell transplant (uses someone else's stem cells) Autoimmune diseases, such as Crohn disease, ulcerative colitis, lupus History of chest radiation Nervous system problems, such  as Guillain-Barre syndrome or myasthenia gravis Organ transplant An unusual or allergic reaction to cemiplimab, other medications, foods, dyes, or preservatives Pregnant or trying to get pregnant Breastfeeding How should I use this medication? This medication is  infused into a vein. It is given by your care team in a hospital or clinic setting. A special MedGuide will be given to you before each treatment. Be sure to read this information carefully each time. Talk to your care team about the use of this medication in children. Special care may be needed. Overdosage: If you think you have taken too much of this medicine contact a poison control center or emergency room at once. NOTE: This medicine is only for you. Do not share this medicine with others. What if I miss a dose? Keep appointments for follow-up doses. It is important not to miss your dose. Call your care team if you are unable to keep an appointment. What may interact with this medication? Interactions have not been studied. This list may not describe all possible interactions. Give your health care provider a list of all the medicines, herbs, non-prescription drugs, or dietary supplements you use. Also tell them if you smoke, drink alcohol, or use illegal drugs. Some items may interact with your medicine. What should I watch for while using this medication? Visit your care team for regular checks on your progress. It may be some time before you see the benefit from this medication. You may need blood work done while taking this medication. This medication may cause serious skin reactions. They can happen weeks to months after starting the medication. Contact your care team right away if you notice fevers or flu-like symptoms with a rash. The rash may be red or purple and then turn into blisters or peeling of the skin. You may also notice a red rash with swelling of the face, lips, or lymph nodes in your neck or under your arms. Tell your care team right away if you have any change in your eyesight. Talk to your care team if you may be pregnant. You will need a negative pregnancy test before starting this medication. Contraception is recommended while taking this medication and for 4 months after  the last dose. Your care team can help you find the option that works for you. Do not breastfeed while taking this medication and for at least 4 months after the last dose. What side effects may I notice from receiving this medication? Side effects that you should report to your care team as soon as possible: Allergic reactions--skin rash, itching, hives, swelling of the face, lips, tongue, or throat Dry cough, shortness of breath or trouble breathing Eye pain, redness, irritation, or discharge with blurry or decreased vision Heart muscle inflammation--unusual weakness or fatigue, shortness of breath, chest pain, fast or irregular heartbeat, dizziness, swelling of the ankles, feet, or hands Hormone gland problems--headache, sensitivity to light, unusual weakness or fatigue, dizziness, fast or irregular heartbeat, increased sensitivity to cold or heat, excessive sweating, constipation, hair loss, increased thirst or amount of urine, tremors or shaking, irritability Infusion reactions--chest pain, shortness of breath or trouble breathing, feeling faint or lightheaded Kidney injury (glomerulonephritis)--decrease in the amount of urine, red or dark brown urine, foamy or bubbly urine, swelling of the ankles, hands, or feet Liver injury--right upper belly pain, loss of appetite, nausea, light-colored stool, dark yellow or brown urine, yellowing skin or eyes, unusual weakness or fatigue Pain, tingling, or numbness in the hands or feet, muscle weakness,  change in vision, confusion or trouble speaking, loss of balance or coordination, trouble walking, seizures Rash, fever, and swollen lymph nodes Redness, blistering, peeling, or loosening of the skin, including inside the mouth Sudden or severe stomach pain, bloody diarrhea, fever, nausea, vomiting Side effects that usually do not require medical attention (report these to your care team if they continue or are bothersome): Bone, joint, or muscle  pain Diarrhea Fatigue Loss of appetite Nausea Skin rash This list may not describe all possible side effects. Call your doctor for medical advice about side effects. You may report side effects to FDA at 1-800-FDA-1088. Where should I keep my medication? This medication is given in a hospital or clinic and will not be stored at home. NOTE: This sheet is a summary. It may not cover all possible information. If you have questions about this medicine, talk to your doctor, pharmacist, or health care provider.  2024 Elsevier/Gold Standard (2022-07-22 00:00:00)

## 2023-01-24 ENCOUNTER — Other Ambulatory Visit: Payer: Self-pay

## 2023-01-27 ENCOUNTER — Other Ambulatory Visit: Payer: Self-pay

## 2023-02-05 DIAGNOSIS — H52203 Unspecified astigmatism, bilateral: Secondary | ICD-10-CM | POA: Diagnosis not present

## 2023-02-05 DIAGNOSIS — H401132 Primary open-angle glaucoma, bilateral, moderate stage: Secondary | ICD-10-CM | POA: Diagnosis not present

## 2023-02-05 DIAGNOSIS — H401112 Primary open-angle glaucoma, right eye, moderate stage: Secondary | ICD-10-CM | POA: Diagnosis not present

## 2023-02-05 DIAGNOSIS — H26493 Other secondary cataract, bilateral: Secondary | ICD-10-CM | POA: Diagnosis not present

## 2023-02-05 DIAGNOSIS — E119 Type 2 diabetes mellitus without complications: Secondary | ICD-10-CM | POA: Diagnosis not present

## 2023-02-05 LAB — HM DIABETES EYE EXAM

## 2023-02-06 ENCOUNTER — Inpatient Hospital Stay: Payer: Medicare Other

## 2023-02-06 ENCOUNTER — Inpatient Hospital Stay (HOSPITAL_BASED_OUTPATIENT_CLINIC_OR_DEPARTMENT_OTHER): Payer: Medicare Other | Admitting: Internal Medicine

## 2023-02-06 DIAGNOSIS — C44329 Squamous cell carcinoma of skin of other parts of face: Secondary | ICD-10-CM | POA: Diagnosis not present

## 2023-02-06 DIAGNOSIS — C7989 Secondary malignant neoplasm of other specified sites: Secondary | ICD-10-CM | POA: Diagnosis not present

## 2023-02-06 DIAGNOSIS — Z79620 Long term (current) use of immunosuppressive biologic: Secondary | ICD-10-CM | POA: Diagnosis not present

## 2023-02-06 DIAGNOSIS — Z5112 Encounter for antineoplastic immunotherapy: Secondary | ICD-10-CM | POA: Diagnosis not present

## 2023-02-06 LAB — CBC WITH DIFFERENTIAL (CANCER CENTER ONLY)
Abs Immature Granulocytes: 0.02 10*3/uL (ref 0.00–0.07)
Basophils Absolute: 0 10*3/uL (ref 0.0–0.1)
Basophils Relative: 0 %
Eosinophils Absolute: 0.1 10*3/uL (ref 0.0–0.5)
Eosinophils Relative: 2 %
HCT: 40.4 % (ref 39.0–52.0)
Hemoglobin: 14.3 g/dL (ref 13.0–17.0)
Immature Granulocytes: 0 %
Lymphocytes Relative: 19 %
Lymphs Abs: 1.3 10*3/uL (ref 0.7–4.0)
MCH: 31.4 pg (ref 26.0–34.0)
MCHC: 35.4 g/dL (ref 30.0–36.0)
MCV: 88.6 fL (ref 80.0–100.0)
Monocytes Absolute: 0.5 10*3/uL (ref 0.1–1.0)
Monocytes Relative: 6 %
Neutro Abs: 5.1 10*3/uL (ref 1.7–7.7)
Neutrophils Relative %: 73 %
Platelet Count: 222 10*3/uL (ref 150–400)
RBC: 4.56 MIL/uL (ref 4.22–5.81)
RDW: 13.3 % (ref 11.5–15.5)
WBC Count: 7 10*3/uL (ref 4.0–10.5)
nRBC: 0 % (ref 0.0–0.2)

## 2023-02-06 LAB — CMP (CANCER CENTER ONLY)
ALT: 16 U/L (ref 0–44)
AST: 17 U/L (ref 15–41)
Albumin: 4.3 g/dL (ref 3.5–5.0)
Alkaline Phosphatase: 107 U/L (ref 38–126)
Anion gap: 7 (ref 5–15)
BUN: 20 mg/dL (ref 8–23)
CO2: 30 mmol/L (ref 22–32)
Calcium: 9.9 mg/dL (ref 8.9–10.3)
Chloride: 103 mmol/L (ref 98–111)
Creatinine: 1.04 mg/dL (ref 0.61–1.24)
GFR, Estimated: >60 mL/min (ref >60)
Glucose, Bld: 141 mg/dL — ABNORMAL HIGH (ref 70–99)
Potassium: 4.1 mmol/L (ref 3.5–5.1)
Sodium: 140 mmol/L (ref 135–145)
Total Bilirubin: 0.6 mg/dL (ref 0.3–1.2)
Total Protein: 7.3 g/dL (ref 6.5–8.1)

## 2023-02-06 LAB — TSH: TSH: 1.84 u[IU]/mL (ref 0.350–4.500)

## 2023-02-06 MED ORDER — SODIUM CHLORIDE 0.9 % IV SOLN
350.0000 mg | Freq: Once | INTRAVENOUS | Status: AC
  Administered 2023-02-06: 350 mg via INTRAVENOUS
  Filled 2023-02-06: qty 7

## 2023-02-06 MED ORDER — SODIUM CHLORIDE 0.9 % IV SOLN: Freq: Once | INTRAVENOUS | Status: AC

## 2023-02-06 NOTE — Patient Instructions (Signed)
[facility]  Discharge Instructions: Thank you for choosing [facility] to provide your oncology and hematology care.   If you have a lab appointment with the Cancer Center, please go directly to the Cancer Center and check in at the registration area.   Wear comfortable clothing and clothing appropriate for easy access to any Portacath or PICC line.   We strive to give you quality time with your provider. You may need to reschedule your appointment if you arrive late (15 or more minutes).  Arriving late affects you and other patients whose appointments are after yours.  Also, if you miss three or more appointments without notifying the office, you may be dismissed from the clinic at the provider's discretion.      For prescription refill requests, have your pharmacy contact our office and allow 72 hours for refills to be completed.    Today you received the following chemotherapy and/or immunotherapy agents: Libtayo.       To help prevent nausea and vomiting after your treatment, we encourage you to take your nausea medication as directed.  BELOW ARE SYMPTOMS THAT SHOULD BE REPORTED IMMEDIATELY: *FEVER GREATER THAN 100.4 F (38 C) OR HIGHER *CHILLS OR SWEATING *NAUSEA AND VOMITING THAT IS NOT CONTROLLED WITH YOUR NAUSEA MEDICATION *UNUSUAL SHORTNESS OF BREATH *UNUSUAL BRUISING OR BLEEDING *URINARY PROBLEMS (pain or burning when urinating, or frequent urination) *BOWEL PROBLEMS (unusual diarrhea, constipation, pain near the anus) TENDERNESS IN MOUTH AND THROAT WITH OR WITHOUT PRESENCE OF ULCERS (sore throat, sores in mouth, or a toothache) UNUSUAL RASH, SWELLING OR PAIN  UNUSUAL VAGINAL DISCHARGE OR ITCHING   Items with * indicate a potential emergency and should be followed up as soon as possible or go to the Emergency Department if any problems should occur.  Please show the CHEMOTHERAPY ALERT CARD or IMMUNOTHERAPY ALERT CARD at  check-in to the Emergency Department and triage nurse.  Should you have questions after your visit or need to cancel or reschedule your appointment, please contact [facility]  Dept: [phone]  and follow the prompts.  Office hours are 8:00 a.m. to 4:30 p.m. Monday - Friday. Please note that voicemails left after 4:00 p.m. may not be returned until the following business day.  We are closed weekends and major holidays. You have access to a nurse at all times for urgent questions. Please call the main number to the clinic Dept: [phone] and follow the prompts.   For any non-urgent questions, you may also contact your provider using MyChart. We now offer e-Visits for anyone 18 and older to request care online for non-urgent symptoms. For details visit [email or web address].   Also download the MyChart app! Go to the app store, search "MyChart", open the app, select [place], and log in with your MyChart username and password.   

## 2023-02-06 NOTE — Progress Notes (Signed)
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 He initially presented with skin cancer around the parotid gland in April 2022.   PRIOR THERAPY: 1) status post left parotidectomy with skin resection and rotational flap completed by Dr. Pollyann Kennedy in April 2022. Final pathology showed squamous cell carcinoma.  2) status post radiation therapy under the care of Dr. Basilio Cairo completing 33 fractions on December 19, 2020 for a total of 66 Gray   CURRENT THERAPY: Libtayo (Cempilimab) 350 Mg IV every 3 weeks started August 10, 2021.  Status post 25 cycles of treatment.  INTERVAL HISTORY: Shaun Mcmillan 82 y.o. male returns to the clinic today for follow-up visit accompanied by his wife.  The patient is feeling fine today with no concerning complaints.  He lost few pounds recently.  He denied having any current chest pain, shortness of breath, cough or hemoptysis.  He has no nausea, vomiting, diarrhea or constipation.  He has no headache or visual changes.  He denied having any significant weight loss or night sweats.  He is here today for evaluation before starting cycle #26 of his treatment.  MEDICAL HISTORY: Past Medical History:  Diagnosis Date   Arthritis    Diabetes mellitus type II    Glaucoma    sees Dr. 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  MEDICATIONS:  Current Outpatient Medications  Medication Sig Dispense Refill   acetaminophen (TYLENOL) 500 MG tablet Take 1,000 mg by mouth every 8 (eight) hours as needed for moderate pain.     amLODipine  (NORVASC) 10 MG tablet TAKE 1 TABLET BY MOUTH DAILY 100 tablet 2   aspirin EC 81 MG tablet Take 1 tablet (81 mg total) by mouth daily. Swallow whole. 30 tablet 11   atorvastatin (LIPITOR) 40 MG tablet TAKE 1 TABLET BY MOUTH DAILY 90 tablet 3   Cholecalciferol (VITAMIN D PO) Take 5,000 Units by mouth daily.     gabapentin (NEURONTIN) 300 MG capsule TAKE 2 CAPSULES BY MOUTH 3 TIMES A DAY 180 capsule 1   latanoprost (XALATAN) 0.005 % ophthalmic solution Place 1 drop into both eyes every morning. [facility]  [address]     LORazepam (ATIVAN) 1 MG tablet Take 1 tablet (1 mg total) by mouth 3 (three) times daily as needed for anxiety. 60 tablet 5   losartan (COZAAR) 25 MG tablet Take 1 tablet (25 mg total) by mouth daily. 90 tablet 3   Melatonin 5 MG TABS Take 5 mg by mouth at bedtime. [facility] [address]     metFORMIN (GLUCOPHAGE) 500 MG tablet TAKE 1 TABLET BY MOUTH TWICE  DAILY WITH A MEAL 180 tablet 3   Multiple Vitamin (MULTIVITAMIN) tablet Take 1 tablet by mouth daily.     nystatin (MYCOSTATIN) 100000 UNIT/ML suspension Take 5 mLs (500,000 Units total) by mouth 4 (four) times daily. 60 mL 1   prochlorperazine (COMPAZINE) 10 MG tablet Take 1 tablet (10 mg total) by mouth every 6 (six) hours  as needed for nausea or vomiting. 30 tablet 0   timolol (BETIMOL) 0.5 % ophthalmic solution Place 1 drop into both eyes daily. [facility] [address]     No current facility-administered medications for this visit.    SURGICAL HISTORY:  Past Surgical History:  Procedure Laterality Date   APPENDECTOMY     colonscopy  03/12/2016   per Dr. Ewing Schlein, benign polyps, repeat in 5 yrs   left craniotomy  11/03/08   per Dr. Marikay Alar for a subdrual hematoma   PAROTIDECTOMY Left 09/20/2020   Procedure: PAROTIDECTOMY with resection of skin and rotational flap;  Surgeon: Serena Colonel, MD;  Location: [facility] OR;  Service: ENT;  Laterality: Left;   surgery for ocmpound fracture     right leg    REVIEW OF SYSTEMS:  A comprehensive review of  systems was negative.   PHYSICAL EXAMINATION: General appearance: alert, cooperative, and no distress Head: Normocephalic, without obvious abnormality, atraumatic Neck: no adenopathy, no JVD, supple, symmetrical, trachea midline, and thyroid not enlarged, symmetric, no tenderness/mass/nodules Lymph nodes: Cervical, supraclavicular, and axillary nodes normal. Resp: clear to auscultation bilaterally Back: symmetric, no curvature. ROM normal. No CVA tenderness. Cardio: regular rate and rhythm, S1, S2 normal, no murmur, click, rub or gallop GI: soft, non-tender; bowel sounds normal; no masses,  no organomegaly Extremities: extremities normal, atraumatic, no cyanosis or edema  ECOG PERFORMANCE STATUS: 1 - Symptomatic but completely ambulatory  Blood pressure 137/61, pulse 70, temperature 97.6 F (36.4 C), temperature source Oral, resp. rate 17, height 5\' 10"  (1.778 m), weight 131 lb (59.4 kg), SpO2 99%.  LABORATORY DATA: Lab Results  Component Value Date   WBC 7.0 02/06/2023   HGB 14.3 02/06/2023   HCT 40.4 02/06/2023   MCV 88.6 02/06/2023   PLT 222 02/06/2023      Chemistry      Component Value Date/Time   NA 143 01/16/2023 1053   K 3.6 01/16/2023 1053   CL 107 01/16/2023 1053   CO2 29 01/16/2023 1053   BUN 24 (H) 01/16/2023 1053   CREATININE 1.00 01/16/2023 1053   CREATININE 0.69 (L) 01/08/2021 1553      Component Value Date/Time   CALCIUM 9.4 01/16/2023 1053   ALKPHOS 96 01/16/2023 1053   AST 17 01/16/2023 1053   ALT 18 01/16/2023 1053   BILITOT 0.7 01/16/2023 1053       RADIOGRAPHIC STUDIES: CT CHEST ABDOMEN PELVIS W CONTRAST  Result Date: 01/15/2023 CLINICAL DATA:  Metastatic squamous cell carcinoma. Assess treatment response. * Tracking Code: BO * EXAM: CT CHEST, ABDOMEN, AND PELVIS WITH CONTRAST TECHNIQUE: Multidetector CT imaging of the chest, abdomen and pelvis was performed following the standard protocol during bolus administration of intravenous contrast.  RADIATION DOSE REDUCTION: This exam was performed according to the departmental dose-optimization program which includes automated exposure control, adjustment of the mA and/or kV according to patient size and/or use of iterative reconstruction technique. CONTRAST:  OMNIPAQUE IOHEXOL 300 MG/ML  SOLN COMPARISON:  Prior CTs 10/18/2022 and 06/19/2022. FINDINGS: Neck CT dictated separately. CT CHEST FINDINGS Cardiovascular: Again demonstrated is extensive atherosclerosis of the aorta, great vessels and coronary arteries. No acute vascular findings are demonstrated. There are calcifications of the aortic valve. The heart size is normal. There is no pericardial effusion. Mediastinum/Nodes: There are no enlarged mediastinal, hilar or axillary lymph nodes. The thyroid gland, trachea and esophagus demonstrate no significant findings. Lungs/Pleura: No pleural effusion or pneumothorax. Previously demonstrated lingular nodule shows further decrease in size, measuring 4  mm on image 78/5 (previously 5 mm). Streaky opacity previously noted in the left lower lobe has nearly resolved with mild residual linear scarring. There is similar linear scarring in the right lower lobe. No new or enlarging pulmonary nodules. Musculoskeletal/Chest wall: No chest wall mass or suspicious osseous findings. CT ABDOMEN AND PELVIS FINDINGS Hepatobiliary: The liver is normal in density without suspicious focal abnormality. No evidence of gallstones, gallbladder wall thickening or biliary dilatation. Pancreas: Unremarkable. No pancreatic ductal dilatation or surrounding inflammatory changes. Spleen: Normal in size without focal abnormality. Adrenals/Urinary Tract: Both adrenal glands appear normal. No evidence of urinary tract calculus, suspicious renal lesion or hydronephrosis. Unchanged simple cyst in the upper pole the right kidney for which no follow-up imaging is recommended. The bladder is incompletely distended and shows increased wall  thickening compared with the prior study. No surrounding inflammatory changes identified. Stomach/Bowel: No enteric contrast administered. The stomach appears unremarkable for its degree of distension. No evidence of bowel wall thickening, distention or surrounding inflammatory change. Vascular/Lymphatic: There are no enlarged abdominal or pelvic lymph nodes. Aortic and branch vessel atherosclerosis without evidence of large vessel occlusion or aneurysm. Reproductive: The prostate gland appears stable without significant findings. Other: No evidence of abdominal wall mass or hernia. No ascites or pneumoperitoneum. Musculoskeletal: No acute or significant osseous findings. Stable mild degenerative changes in the spine associated with a minimal convex right lumbar scoliosis. Unless specific follow-up recommendations are mentioned in the findings or impression sections, no imaging follow-up of any mentioned incidental findings is recommended. IMPRESSION: 1. Further decrease in size of previously demonstrated lingular nodule, consistent with a benign finding. No new or enlarging pulmonary nodules. 2. No evidence of metastatic disease within the chest, abdomen or pelvis. 3. Increased bladder wall thickening which may be due to incomplete distention. Correlate with urine analysis. 4. Neck CT dictated separately. 5.  Aortic Atherosclerosis (ICD10-I70.0). Electronically Signed   By: Carey Bullocks M.D.   On: 01/15/2023 10:33   CT Soft Tissue Neck W Contrast  Result Date: 01/15/2023 CLINICAL DATA:  Squamous cell carcinoma of the parotid EXAM: CT NECK WITH CONTRAST TECHNIQUE: Multidetector CT imaging of the neck was performed using the standard protocol following the bolus administration of intravenous contrast. RADIATION DOSE REDUCTION: This exam was performed according to the departmental dose-optimization program which includes automated exposure control, adjustment of the mA and/or kV according to patient size and/or  use of iterative reconstruction technique. CONTRAST:  OMNIPAQUE IOHEXOL 300 MG/ML  SOLN COMPARISON:  CT neck 10/18/2018 FINDINGS: Pharynx and larynx: The nasal cavity and nasopharynx are unremarkable. The oral cavity and oropharynx are unremarkable. The parapharyngeal spaces are clear. The hypopharynx and larynx are unremarkable. The vocal folds are normal in appearance. The epiglottis is normal. There is no retropharyngeal collection. The airway is patent. Salivary glands: The left parotid gland is surgically absent. There is no abnormal soft tissue mass or enhancement at the parotidectomy site. The right parotid and bilateral submandibular glands are atrophic but otherwise unremarkable. Thyroid: Unremarkable. Lymph nodes: There is no pathologic lymphadenopathy in the neck. Vascular: There is calcified plaque at the carotid bifurcations. The vertebral arteries are patent. The internal jugular veins are patent. Limited intracranial: Unremarkable. Visualized orbits: Not included within the field of view. Mastoids and visualized paranasal sinuses: Clear. Skeleton: There is no acute osseous abnormality or suspicious osseous lesion. Upper chest: Assessed on the separately dictated CT chest. Other: None. IMPRESSION: Stable exam without evidence of recurrent disease or pathologic lymphadenopathy. Electronically Signed  By: Lesia Hausen M.D.   On: 01/15/2023 08:50    ASSESSMENT AND PLAN: This is a very pleasant 82 years old white male with stage IV squamous cell carcinoma of the skin with pulmonary metastasis initially diagnosed in April 2022 with involvement of the skin around the left parotid gland status post left parotidectomy with skin resection followed by adjuvant radiotherapy.  He had evidence for disease progression and metastasis to the lung in February 2023. The patient is currently undergoing treatment with immunotherapy with Libtayo (Cempilimab) 350 Mg IV every 3 weeks status post 25 cycles.   The  patient has been tolerating this treatment fairly well with no concerning adverse effects. I recommended for him to proceed with cycle #26 of his treatment today as planned. I will see him back for follow-up visit in 3 weeks for evaluation before the next cycle of his treatment. He was advised to call immediately if he has any other concerning symptoms in the interval. The patient voices understanding of current disease status and treatment options and is in agreement with the current care plan.  All questions were answered. The patient knows to call the clinic with any problems, questions or concerns. We can certainly see the patient much sooner if necessary.  The total time spent in the appointment was 20 minutes.  Disclaimer: This note was dictated with voice recognition software. Similar sounding words can inadvertently be transcribed and may not be corrected upon review.

## 2023-02-07 LAB — T4: T4, Total: 6.7 ug/dL (ref 4.5–12.0)

## 2023-02-19 ENCOUNTER — Other Ambulatory Visit: Payer: Self-pay

## 2023-02-27 ENCOUNTER — Inpatient Hospital Stay (HOSPITAL_BASED_OUTPATIENT_CLINIC_OR_DEPARTMENT_OTHER): Payer: Medicare Other | Admitting: Internal Medicine

## 2023-02-27 ENCOUNTER — Inpatient Hospital Stay: Payer: Medicare Other

## 2023-02-27 ENCOUNTER — Inpatient Hospital Stay: Payer: Medicare Other | Attending: Internal Medicine

## 2023-02-27 DIAGNOSIS — C7800 Secondary malignant neoplasm of unspecified lung: Secondary | ICD-10-CM | POA: Diagnosis not present

## 2023-02-27 DIAGNOSIS — C7989 Secondary malignant neoplasm of other specified sites: Secondary | ICD-10-CM

## 2023-02-27 DIAGNOSIS — Z79899 Other long term (current) drug therapy: Secondary | ICD-10-CM | POA: Diagnosis not present

## 2023-02-27 DIAGNOSIS — Z7982 Long term (current) use of aspirin: Secondary | ICD-10-CM | POA: Insufficient documentation

## 2023-02-27 DIAGNOSIS — Z5112 Encounter for antineoplastic immunotherapy: Secondary | ICD-10-CM | POA: Diagnosis not present

## 2023-02-27 DIAGNOSIS — C44329 Squamous cell carcinoma of skin of other parts of face: Secondary | ICD-10-CM | POA: Insufficient documentation

## 2023-02-27 DIAGNOSIS — Z923 Personal history of irradiation: Secondary | ICD-10-CM | POA: Insufficient documentation

## 2023-02-27 LAB — CBC WITH DIFFERENTIAL (CANCER CENTER ONLY)
Abs Immature Granulocytes: 0.01 10*3/uL (ref 0.00–0.07)
Basophils Absolute: 0 10*3/uL (ref 0.0–0.1)
Basophils Relative: 0 %
Eosinophils Absolute: 0.2 10*3/uL (ref 0.0–0.5)
Eosinophils Relative: 4 %
HCT: 40 % (ref 39.0–52.0)
Hemoglobin: 13.6 g/dL (ref 13.0–17.0)
Immature Granulocytes: 0 %
Lymphocytes Relative: 15 %
Lymphs Abs: 0.8 10*3/uL (ref 0.7–4.0)
MCH: 30.6 pg (ref 26.0–34.0)
MCHC: 34 g/dL (ref 30.0–36.0)
MCV: 90.1 fL (ref 80.0–100.0)
Monocytes Absolute: 0.5 10*3/uL (ref 0.1–1.0)
Monocytes Relative: 9 %
Neutro Abs: 3.9 10*3/uL (ref 1.7–7.7)
Neutrophils Relative %: 72 %
Platelet Count: 187 10*3/uL (ref 150–400)
RBC: 4.44 MIL/uL (ref 4.22–5.81)
RDW: 13.2 % (ref 11.5–15.5)
WBC Count: 5.5 10*3/uL (ref 4.0–10.5)
nRBC: 0 % (ref 0.0–0.2)

## 2023-02-27 LAB — CMP (CANCER CENTER ONLY)
ALT: 12 U/L (ref 0–44)
AST: 15 U/L (ref 15–41)
Albumin: 4 g/dL (ref 3.5–5.0)
Alkaline Phosphatase: 103 U/L (ref 38–126)
Anion gap: 5 (ref 5–15)
BUN: 18 mg/dL (ref 8–23)
CO2: 30 mmol/L (ref 22–32)
Calcium: 9.6 mg/dL (ref 8.9–10.3)
Chloride: 105 mmol/L (ref 98–111)
Creatinine: 1.03 mg/dL (ref 0.61–1.24)
GFR, Estimated: >60 mL/min (ref >60)
Glucose, Bld: 196 mg/dL — ABNORMAL HIGH (ref 70–99)
Potassium: 3.8 mmol/L (ref 3.5–5.1)
Sodium: 140 mmol/L (ref 135–145)
Total Bilirubin: 0.8 mg/dL (ref 0.3–1.2)
Total Protein: 6.7 g/dL (ref 6.5–8.1)

## 2023-02-27 LAB — TSH: TSH: 1.429 u[IU]/mL (ref 0.350–4.500)

## 2023-02-27 MED ORDER — SODIUM CHLORIDE 0.9 % IV SOLN
350.0000 mg | Freq: Once | INTRAVENOUS | Status: AC
  Administered 2023-02-27: 350 mg via INTRAVENOUS
  Filled 2023-02-27: qty 7

## 2023-02-27 MED ORDER — SODIUM CHLORIDE 0.9 % IV SOLN: Freq: Once | INTRAVENOUS | Status: AC

## 2023-02-27 NOTE — Progress Notes (Signed)
[facility] [phone]   Fax:(336) [phone]  OFFICE PROGRESS NOTE  [name], MD [address] 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 He initially presented with skin cancer around the parotid gland in April 2022.   PRIOR THERAPY: 1) status post left parotidectomy with skin resection and rotational flap completed by Dr. Pollyann Kennedy in April 2022. Final pathology showed squamous cell carcinoma.  2) status post radiation therapy under the care of Dr. Basilio Cairo completing 33 fractions on December 19, 2020 for a total of 66 Gray   CURRENT THERAPY: Libtayo (Cempilimab) 350 Mg IV every 3 weeks started August 10, 2021.  Status post 26 cycles of treatment.  INTERVAL HISTORY: Shaun Mcmillan 82 y.o. male returns to the clinic today for follow-up visit.  The patient is feeling fine today with no concerning complaints.  He has no current chest pain, shortness of breath, cough or hemoptysis.  He has no nausea, vomiting, diarrhea or constipation.  He denied having any recent weight loss or night sweats.  He has no fever or chills.  He continues to tolerate his treatment with Libtayo (Cempilimab) fairly well.  He is here for evaluation before starting cycle #27.  MEDICAL HISTORY: Past Medical History:  Diagnosis Date   Arthritis    Diabetes mellitus type II    Glaucoma    sees Dr. 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  MEDICATIONS:  Current Outpatient Medications  Medication Sig Dispense Refill   acetaminophen (TYLENOL) 500 MG tablet Take 1,000 mg by mouth every 8 (eight) hours as needed for moderate pain.     amLODipine (NORVASC) 10 MG tablet TAKE  1 TABLET BY MOUTH DAILY 100 tablet 2   aspirin EC 81 MG tablet Take 1 tablet (81 mg total) by mouth daily. Swallow whole. 30 tablet 11   atorvastatin (LIPITOR) 40 MG tablet TAKE 1 TABLET BY MOUTH DAILY 90 tablet 3   Cholecalciferol (VITAMIN D PO) Take 5,000 Units by mouth daily.     gabapentin (NEURONTIN) 300 MG capsule TAKE 2 CAPSULES BY MOUTH 3 TIMES A DAY 180 capsule 1   latanoprost (XALATAN) 0.005 % ophthalmic solution Place 1 drop into both eyes every morning. [facility]  [address]     LORazepam (ATIVAN) 1 MG tablet Take 1 tablet (1 mg total) by mouth 3 (three) times daily as needed for anxiety. 60 tablet 5   losartan (COZAAR) 25 MG tablet Take 1 tablet (25 mg total) by mouth daily. 90 tablet 3   Melatonin 5 MG TABS Take 5 mg by mouth at bedtime. [facility] [address]     metFORMIN (GLUCOPHAGE) 500 MG tablet TAKE 1 TABLET BY MOUTH TWICE  DAILY WITH A MEAL 180 tablet 3   Multiple Vitamin (MULTIVITAMIN) tablet Take 1 tablet by mouth daily.     nystatin (MYCOSTATIN) 100000 UNIT/ML suspension Take 5 mLs (500,000 Units total) by mouth 4 (four) times daily. 60 mL 1   prochlorperazine (COMPAZINE) 10 MG tablet Take 1 tablet (10 mg total) by mouth every 6 (six) hours as needed for nausea  or vomiting. 30 tablet 0   timolol (BETIMOL) 0.5 % ophthalmic solution Place 1 drop into both eyes daily. [facility] [address]     No current facility-administered medications for this visit.    SURGICAL HISTORY:  Past Surgical History:  Procedure Laterality Date   APPENDECTOMY     colonscopy  03/12/2016   per Dr. Ewing Schlein, benign polyps, repeat in 5 yrs   left craniotomy  11/03/08   per Dr. Marikay Alar for a subdrual hematoma   PAROTIDECTOMY Left 09/20/2020   Procedure: PAROTIDECTOMY with resection of skin and rotational flap;  Surgeon: Serena Colonel, MD;  Location: [facility] OR;  Service: ENT;  Laterality: Left;   surgery for ocmpound fracture     right leg    REVIEW OF SYSTEMS:  A comprehensive review of systems was negative.    PHYSICAL EXAMINATION: General appearance: alert, cooperative, and no distress Head: Normocephalic, without obvious abnormality, atraumatic Neck: no adenopathy, no JVD, supple, symmetrical, trachea midline, and thyroid not enlarged, symmetric, no tenderness/mass/nodules Lymph nodes: Cervical, supraclavicular, and axillary nodes normal. Resp: clear to auscultation bilaterally Back: symmetric, no curvature. ROM normal. No CVA tenderness. Cardio: regular rate and rhythm, S1, S2 normal, no murmur, click, rub or gallop GI: soft, non-tender; bowel sounds normal; no masses,  no organomegaly Extremities: extremities normal, atraumatic, no cyanosis or edema  ECOG PERFORMANCE STATUS: 1 - Symptomatic but completely ambulatory  Blood pressure 134/68, pulse 77, temperature 98.2 F (36.8 C), temperature source Oral, resp. rate 17, height 5\' 10"  (1.778 m), weight 135 lb 6.4 oz (61.4 kg), SpO2 99%.  LABORATORY DATA: Lab Results  Component Value Date   WBC 5.5 02/27/2023   HGB 13.6 02/27/2023   HCT 40.0 02/27/2023   MCV 90.1 02/27/2023   PLT 187 02/27/2023      Chemistry      Component Value Date/Time   NA 140 02/27/2023 0940   K 3.8 02/27/2023 0940   CL 105 02/27/2023 0940   CO2 30 02/27/2023 0940   BUN 18 02/27/2023 0940   CREATININE 1.03 02/27/2023 0940   CREATININE 0.69 (L) 01/08/2021 1553      Component Value Date/Time   CALCIUM 9.6 02/27/2023 0940   ALKPHOS 103 02/27/2023 0940   AST 15 02/27/2023 0940   ALT 12 02/27/2023 0940   BILITOT 0.8 02/27/2023 0940       RADIOGRAPHIC STUDIES: No results found.  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  He had evidence for disease progression and metastasis  to the lung in February 2023. The patient is currently undergoing treatment with immunotherapy with Libtayo (Cempilimab) 350 Mg IV every 3 weeks status post 26  cycles.   The patient has been tolerating this treatment well with no concerning adverse effects. I recommended for him to proceed with cycle #27 today as planned. I will see him back for follow-up visit in 3 weeks for evaluation before the next cycle of his treatment. He was advised to call immediately if he has any other concerning symptoms in the interval. The patient voices understanding of current disease status and treatment options and is in agreement with the current care plan.  All questions were answered. The patient knows to call the clinic with any problems, questions  or concerns. We can certainly see the patient much sooner if necessary.  The total time spent in the appointment was 20 minutes.  Disclaimer: This note was dictated with voice recognition software. Similar sounding words can inadvertently be transcribed and may not be corrected upon review.

## 2023-02-27 NOTE — Patient Instructions (Signed)
[facility]  Discharge Instructions: Thank you for choosing [facility] to provide your oncology and hematology care.   If you have a lab appointment with the Cancer Center, please go directly to the Cancer Center and check in at the registration area.   Wear comfortable clothing and clothing appropriate for easy access to any Portacath or PICC line.   We strive to give you quality time with your provider. You may need to reschedule your appointment if you arrive late (15 or more minutes).  Arriving late affects you and other patients whose appointments are after yours.  Also, if you miss three or more appointments without notifying the office, you may be dismissed from the clinic at the provider's discretion.      For prescription refill requests, have your pharmacy contact our office and allow 72 hours for refills to be completed.    Today you received the following chemotherapy and/or immunotherapy agents: Libtayo.       To help prevent nausea and vomiting after your treatment, we encourage you to take your nausea medication as directed.  BELOW ARE SYMPTOMS THAT SHOULD BE REPORTED IMMEDIATELY: *FEVER GREATER THAN 100.4 F (38 C) OR HIGHER *CHILLS OR SWEATING *NAUSEA AND VOMITING THAT IS NOT CONTROLLED WITH YOUR NAUSEA MEDICATION *UNUSUAL SHORTNESS OF BREATH *UNUSUAL BRUISING OR BLEEDING *URINARY PROBLEMS (pain or burning when urinating, or frequent urination) *BOWEL PROBLEMS (unusual diarrhea, constipation, pain near the anus) TENDERNESS IN MOUTH AND THROAT WITH OR WITHOUT PRESENCE OF ULCERS (sore throat, sores in mouth, or a toothache) UNUSUAL RASH, SWELLING OR PAIN  UNUSUAL VAGINAL DISCHARGE OR ITCHING   Items with * indicate a potential emergency and should be followed up as soon as possible or go to the Emergency Department if any problems should occur.  Please show the CHEMOTHERAPY ALERT CARD or IMMUNOTHERAPY ALERT CARD at  check-in to the Emergency Department and triage nurse.  Should you have questions after your visit or need to cancel or reschedule your appointment, please contact [facility]  Dept: [phone]  and follow the prompts.  Office hours are 8:00 a.m. to 4:30 p.m. Monday - Friday. Please note that voicemails left after 4:00 p.m. may not be returned until the following business day.  We are closed weekends and major holidays. You have access to a nurse at all times for urgent questions. Please call the main number to the clinic Dept: [phone] and follow the prompts.   For any non-urgent questions, you may also contact your provider using MyChart. We now offer e-Visits for anyone 18 and older to request care online for non-urgent symptoms. For details visit [email or web address].   Also download the MyChart app! Go to the app store, search "MyChart", open the app, select [place], and log in with your MyChart username and password.   

## 2023-02-28 LAB — T4: T4, Total: 6.1 ug/dL (ref 4.5–12.0)

## 2023-03-03 ENCOUNTER — Other Ambulatory Visit: Payer: Self-pay

## 2023-03-03 DIAGNOSIS — L570 Actinic keratosis: Secondary | ICD-10-CM | POA: Diagnosis not present

## 2023-03-03 DIAGNOSIS — D692 Other nonthrombocytopenic purpura: Secondary | ICD-10-CM | POA: Diagnosis not present

## 2023-03-03 DIAGNOSIS — Z85828 Personal history of other malignant neoplasm of skin: Secondary | ICD-10-CM | POA: Diagnosis not present

## 2023-03-03 DIAGNOSIS — D225 Melanocytic nevi of trunk: Secondary | ICD-10-CM | POA: Diagnosis not present

## 2023-03-03 DIAGNOSIS — L905 Scar conditions and fibrosis of skin: Secondary | ICD-10-CM | POA: Diagnosis not present

## 2023-03-03 DIAGNOSIS — L821 Other seborrheic keratosis: Secondary | ICD-10-CM | POA: Diagnosis not present

## 2023-03-04 ENCOUNTER — Telehealth (INDEPENDENT_AMBULATORY_CARE_PROVIDER_SITE_OTHER): Payer: Medicare Other | Admitting: Family Medicine

## 2023-03-04 ENCOUNTER — Encounter: Payer: Self-pay | Admitting: Family Medicine

## 2023-03-04 ENCOUNTER — Telehealth: Payer: Self-pay | Admitting: Family Medicine

## 2023-03-04 DIAGNOSIS — R0989 Other specified symptoms and signs involving the circulatory and respiratory systems: Secondary | ICD-10-CM

## 2023-03-04 DIAGNOSIS — U071 COVID-19: Secondary | ICD-10-CM | POA: Diagnosis not present

## 2023-03-04 LAB — POC COVID19 BINAXNOW: SARS Coronavirus 2 Ag: POSITIVE — AB

## 2023-03-04 MED ORDER — NIRMATRELVIR/RITONAVIR (PAXLOVID)TABLET: 3.0000 | ORAL_TABLET | Freq: Two times a day (BID) | ORAL | 0 refills | Status: AC

## 2023-03-04 NOTE — Telephone Encounter (Addendum)
Pt is having chest pain when coughing x 2day or 3 days and per pt no SOB. Pt has an appt for 330pm. Pt was transferred to triage nurse and per pt the nurse was having issue with her computer and per pt was told to callback to our and speak with agent

## 2023-03-04 NOTE — Progress Notes (Signed)
Subjective:    Patient ID: Shaun Mcmillan, male    DOB: 1941/01/12, 82 y.o.   MRN: [id number]  HPI Virtual Visit via Video Note  I connected with the patient on 03/04/23 at  3:30 PM EDT by a video enabled telemedicine application and verified that I am speaking with the correct person using two identifiers.  Location patient: home Location provider:work or home office Persons participating in the virtual visit: patient, provider  I discussed the limitations of evaluation and management by telemedicine and the availability of in person appointments. The patient expressed understanding and agreed to proceed.   HPI: Here for 3 days of stuffy head, PND, and a dry cough. No fever or SOB.    ROS: See pertinent positives and negatives per HPI.  Past Medical History:  Diagnosis Date   Arthritis    Diabetes mellitus type II    Glaucoma    sees Dr. 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 Ewing Schlein, benign polyps, repeat in 5 yrs   left craniotomy  11/03/08   per Dr. Marikay Alar for a subdrual hematoma   PAROTIDECTOMY Left 09/20/2020   Procedure: PAROTIDECTOMY with resection of skin and rotational flap;  Surgeon: Serena Colonel, MD;  Location: [facility] OR;  Service: ENT;  Laterality: Left;   surgery for ocmpound fracture     right leg    Family History  Problem Relation Age of Onset   Cancer Mother    Heart disease Other      Current Outpatient Medications:    acetaminophen (TYLENOL) 500 MG tablet, Take 1,000 mg by mouth every 8 (eight) hours as needed for moderate pain., Disp: , Rfl:    amLODipine (NORVASC) 10 MG tablet, TAKE 1 TABLET BY MOUTH DAILY, Disp: 100 tablet, Rfl: 2   aspirin EC 81 MG  tablet, Take 1 tablet (81 mg total) by mouth daily. Swallow whole., Disp: 30 tablet, Rfl: 11   atorvastatin (LIPITOR) 40 MG tablet, TAKE 1 TABLET BY MOUTH DAILY, Disp: 90 tablet, Rfl: 3   Cholecalciferol (VITAMIN D PO), Take 5,000 Units by mouth daily., Disp: , Rfl:    gabapentin (NEURONTIN) 300 MG capsule, TAKE 2 CAPSULES BY MOUTH 3 TIMES A DAY, Disp: 180 capsule, Rfl: 1   latanoprost (XALATAN) 0.005 % ophthalmic solution, Place 1 drop into both eyes every morning. [facility]  [address], Disp: , Rfl:    LORazepam (ATIVAN) 1 MG tablet, Take 1 tablet (1 mg total) by mouth 3 (three) times daily as needed for anxiety., Disp: 60 tablet, Rfl: 5   losartan (COZAAR) 25 MG tablet, Take 1 tablet (25 mg total) by mouth daily., Disp: 90 tablet, Rfl: 3   Melatonin 5 MG TABS, Take 5 mg by mouth at bedtime. [facility] [address], Disp: , Rfl:    metFORMIN (GLUCOPHAGE) 500 MG tablet, TAKE 1 TABLET BY MOUTH TWICE  DAILY WITH A MEAL, Disp: 180 tablet, Rfl: 3   Multiple Vitamin (MULTIVITAMIN) tablet, Take 1 tablet by mouth daily., Disp: , Rfl:    nystatin (MYCOSTATIN) 100000 UNIT/ML suspension, Take 5 mLs (500,000 Units total) by  mouth 4 (four) times daily., Disp: 60 mL, Rfl: 1   prochlorperazine (COMPAZINE) 10 MG tablet, Take 1 tablet (10 mg total) by mouth every 6 (six) hours as needed for nausea or vomiting., Disp: 30 tablet, Rfl: 0   timolol (BETIMOL) 0.5 % ophthalmic solution, Place 1 drop into both eyes daily. [facility] [address], Disp: , Rfl:   EXAM:  VITALS per patient if applicable:  GENERAL: alert, oriented, appears well and in no acute distress  HEENT: atraumatic, conjunttiva clear, no obvious abnormalities on inspection of external nose and ears  NECK: normal movements of the head and neck  LUNGS: on inspection no signs of respiratory distress, breathing rate appears normal, no obvious gross SOB, gasping or wheezing  CV: no obvious cyanosis  MS: moves all visible extremities without noticeable  abnormality  PSYCH/NEURO: pleasant and cooperative, no obvious depression or anxiety, speech and thought processing grossly intact  ASSESSMENT AND PLAN: Covid infection, treat with 5 days of Paxlovid. Add Delsym as needed. Recheck as needed.  Gershon Crane, MD    Discussed the following assessment and plan:  Chest congestion - Plan: POC COVID-19     I discussed the assessment and treatment plan with the patient. The patient was provided an opportunity to ask questions and all were answered. The patient agreed with the plan and demonstrated an understanding of the instructions.   The patient was advised to call back or seek an in-person evaluation if the symptoms worsen or if the condition fails to improve as anticipated.      Review of Systems     Objective:   Physical Exam        Assessment & Plan:

## 2023-03-10 ENCOUNTER — Telehealth: Payer: Self-pay | Admitting: Medical Oncology

## 2023-03-10 NOTE — Telephone Encounter (Signed)
09/17-tested positive for COVID -19 His only symptom is coughing. Asking if he can keep his appts on 10/3 . I told him yes.

## 2023-03-11 ENCOUNTER — Other Ambulatory Visit: Payer: Self-pay

## 2023-03-20 ENCOUNTER — Inpatient Hospital Stay: Payer: Medicare Other

## 2023-03-20 ENCOUNTER — Encounter: Payer: Self-pay | Admitting: Medical Oncology

## 2023-03-20 ENCOUNTER — Inpatient Hospital Stay: Payer: Medicare Other | Attending: Oncology

## 2023-03-20 ENCOUNTER — Inpatient Hospital Stay (HOSPITAL_BASED_OUTPATIENT_CLINIC_OR_DEPARTMENT_OTHER): Payer: Medicare Other | Admitting: Internal Medicine

## 2023-03-20 DIAGNOSIS — E785 Hyperlipidemia, unspecified: Secondary | ICD-10-CM | POA: Diagnosis not present

## 2023-03-20 DIAGNOSIS — C7989 Secondary malignant neoplasm of other specified sites: Secondary | ICD-10-CM

## 2023-03-20 DIAGNOSIS — Z79620 Long term (current) use of immunosuppressive biologic: Secondary | ICD-10-CM | POA: Insufficient documentation

## 2023-03-20 DIAGNOSIS — C7800 Secondary malignant neoplasm of unspecified lung: Secondary | ICD-10-CM | POA: Diagnosis not present

## 2023-03-20 DIAGNOSIS — E119 Type 2 diabetes mellitus without complications: Secondary | ICD-10-CM | POA: Insufficient documentation

## 2023-03-20 DIAGNOSIS — Z5112 Encounter for antineoplastic immunotherapy: Secondary | ICD-10-CM | POA: Insufficient documentation

## 2023-03-20 DIAGNOSIS — C44329 Squamous cell carcinoma of skin of other parts of face: Secondary | ICD-10-CM | POA: Insufficient documentation

## 2023-03-20 DIAGNOSIS — I10 Essential (primary) hypertension: Secondary | ICD-10-CM | POA: Insufficient documentation

## 2023-03-20 LAB — CMP (CANCER CENTER ONLY)
ALT: 9 U/L (ref 0–44)
AST: 15 U/L (ref 15–41)
Albumin: 3.8 g/dL (ref 3.5–5.0)
Alkaline Phosphatase: 96 U/L (ref 38–126)
Anion gap: 8 (ref 5–15)
BUN: 18 mg/dL (ref 8–23)
CO2: 28 mmol/L (ref 22–32)
Calcium: 9.4 mg/dL (ref 8.9–10.3)
Chloride: 105 mmol/L (ref 98–111)
Creatinine: 1.05 mg/dL (ref 0.61–1.24)
GFR, Estimated: >60 mL/min (ref >60)
Glucose, Bld: 153 mg/dL — ABNORMAL HIGH (ref 70–99)
Potassium: 3.5 mmol/L (ref 3.5–5.1)
Sodium: 141 mmol/L (ref 135–145)
Total Bilirubin: 0.5 mg/dL (ref 0.3–1.2)
Total Protein: 6.5 g/dL (ref 6.5–8.1)

## 2023-03-20 LAB — TSH: TSH: 3.34 u[IU]/mL (ref 0.350–4.500)

## 2023-03-20 LAB — CBC WITH DIFFERENTIAL (CANCER CENTER ONLY)
Abs Immature Granulocytes: 0.02 10*3/uL (ref 0.00–0.07)
Basophils Absolute: 0 10*3/uL (ref 0.0–0.1)
Basophils Relative: 1 %
Eosinophils Absolute: 0.2 10*3/uL (ref 0.0–0.5)
Eosinophils Relative: 3 %
HCT: 39.5 % (ref 39.0–52.0)
Hemoglobin: 13.5 g/dL (ref 13.0–17.0)
Immature Granulocytes: 0 %
Lymphocytes Relative: 20 %
Lymphs Abs: 1.3 10*3/uL (ref 0.7–4.0)
MCH: 30.2 pg (ref 26.0–34.0)
MCHC: 34.2 g/dL (ref 30.0–36.0)
MCV: 88.4 fL (ref 80.0–100.0)
Monocytes Absolute: 0.5 10*3/uL (ref 0.1–1.0)
Monocytes Relative: 8 %
Neutro Abs: 4.3 10*3/uL (ref 1.7–7.7)
Neutrophils Relative %: 68 %
Platelet Count: 244 10*3/uL (ref 150–400)
RBC: 4.47 MIL/uL (ref 4.22–5.81)
RDW: 12.6 % (ref 11.5–15.5)
WBC Count: 6.3 10*3/uL (ref 4.0–10.5)
nRBC: 0 % (ref 0.0–0.2)

## 2023-03-20 MED ORDER — HEPARIN SOD (PORK) LOCK FLUSH 100 UNIT/ML IV SOLN: 500.0000 [IU] | Freq: Once | INTRAVENOUS | Status: DC | PRN

## 2023-03-20 MED ORDER — SODIUM CHLORIDE 0.9% FLUSH: 10.0000 mL | INTRAVENOUS | Status: DC | PRN

## 2023-03-20 MED ORDER — SODIUM CHLORIDE 0.9 % IV SOLN
350.0000 mg | Freq: Once | INTRAVENOUS | Status: AC
  Administered 2023-03-20: 350 mg via INTRAVENOUS
  Filled 2023-03-20: qty 7

## 2023-03-20 MED ORDER — SODIUM CHLORIDE 0.9 % IV SOLN: Freq: Once | INTRAVENOUS | Status: AC

## 2023-03-20 NOTE — Progress Notes (Signed)
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 He initially presented with skin cancer around the parotid gland in April 2022.   PRIOR THERAPY: 1) status post left parotidectomy with skin resection and rotational flap completed by Dr. Pollyann Kennedy in April 2022. Final pathology showed squamous cell carcinoma.  2) status post radiation therapy under the care of Dr. Basilio Cairo completing 33 fractions on December 19, 2020 for a total of 66 Gray   CURRENT THERAPY: Libtayo (Cempilimab) 350 Mg IV every 3 weeks started August 10, 2021.  Status post 27 cycles of treatment.  INTERVAL HISTORY: Shaun Mcmillan 82 y.o. male returns to the clinic today for follow-up visit.Discussed the use of AI scribe software for clinical note transcription with the patient, who gave verbal consent to proceed.  History of Present Illness   The patient, an 82 year old with a history of stage 4 squamous cell carcinoma of the skin, has been receiving immunotherapy with Libtayo every three weeks since April 2022. He is due for his 28th treatment, with a total of 35 treatments planned over two years.  Recently, both the patient and his spouse tested positive for COVID-19. He experienced a cough as his only symptom, while his spouse had a more severe course of illness. The patient expressed concerns about potential lung damage from the virus and the possibility of reinfection.  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 He is considering resuming these visits after the completion of his cancer treatment.  In terms of his cardiovascular health, the patient's blood pressure was noted to be slightly elevated at 160/63.  He is currently on losartan and amlodipine for blood pressure management, having switched from ramipril to losartan. He also takes Lipitor for high cholesterol, gabapentin for neuropathy, and metformin for diabetes. He reported no issues with these medications.  The patient's weight has remained stable, and he denied any chest pain, breathing issues, hemoptysis, or significant weight loss.      MEDICAL HISTORY: Past Medical History:  Diagnosis Date   Arthritis    Diabetes mellitus type II    Glaucoma    sees Dr. 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  MEDICATIONS:  Current Outpatient Medications  Medication Sig Dispense Refill   acetaminophen (TYLENOL) 500 MG tablet Take 1,000 mg by mouth every 8 (eight) hours as needed for moderate pain.     amLODipine (NORVASC) 10 MG tablet TAKE 1 TABLET BY MOUTH DAILY 100 tablet 2   aspirin EC 81 MG tablet Take 1 tablet (81 mg total) by mouth daily. Swallow whole. 30 tablet 11   atorvastatin (LIPITOR) 40 MG tablet TAKE 1 TABLET BY MOUTH DAILY 90 tablet 3   Cholecalciferol (VITAMIN D PO) Take 5,000 Units by mouth daily.     gabapentin (NEURONTIN) 300 MG capsule TAKE 2 CAPSULES BY MOUTH 3 TIMES A DAY 180 capsule  1   latanoprost (XALATAN) 0.005 % ophthalmic solution Place 1 drop into both eyes every morning. [facility]  [address]     LORazepam (ATIVAN) 1 MG tablet Take 1 tablet (1 mg total) by mouth 3 (three) times daily as needed for anxiety. 60 tablet 5   losartan (COZAAR) 25 MG tablet Take 1 tablet (25 mg total) by mouth daily. 90 tablet 3   Melatonin 5 MG TABS Take 5 mg by mouth at bedtime. [facility] [address]     metFORMIN (GLUCOPHAGE) 500 MG tablet TAKE 1 TABLET BY MOUTH TWICE  DAILY WITH A MEAL 180 tablet 3   Multiple Vitamin  (MULTIVITAMIN) tablet Take 1 tablet by mouth daily.     nystatin (MYCOSTATIN) 100000 UNIT/ML suspension Take 5 mLs (500,000 Units total) by mouth 4 (four) times daily. 60 mL 1   prochlorperazine (COMPAZINE) 10 MG tablet Take 1 tablet (10 mg total) by mouth every 6 (six) hours as needed for nausea or vomiting. 30 tablet 0   timolol (BETIMOL) 0.5 % ophthalmic solution Place 1 drop into both eyes daily. [facility] [address]     No current facility-administered medications for this visit.    SURGICAL HISTORY:  Past Surgical History:  Procedure Laterality Date   APPENDECTOMY     colonscopy  03/12/2016   per Dr. Ewing Schlein, benign polyps, repeat in 5 yrs   left craniotomy  11/03/08   per Dr. Marikay Alar for a subdrual hematoma   PAROTIDECTOMY Left 09/20/2020   Procedure: PAROTIDECTOMY with resection of skin and rotational flap;  Surgeon: Serena Colonel, MD;  Location: [facility] OR;  Service: ENT;  Laterality: Left;   surgery for ocmpound fracture     right leg    REVIEW OF SYSTEMS:  A comprehensive review of systems was negative.   PHYSICAL EXAMINATION: General appearance: alert, cooperative, and no distress Head: Normocephalic, without obvious abnormality, atraumatic Neck: no adenopathy, no JVD, supple, symmetrical, trachea midline, and thyroid not enlarged, symmetric, no tenderness/mass/nodules Lymph nodes: Cervical, supraclavicular, and axillary nodes normal. Resp: clear to auscultation bilaterally Back: symmetric, no curvature. ROM normal. No CVA tenderness. Cardio: regular rate and rhythm, S1, S2 normal, no murmur, click, rub or gallop GI: soft, non-tender; bowel sounds normal; no masses,  no organomegaly Extremities: extremities normal, atraumatic, no cyanosis or edema  ECOG PERFORMANCE STATUS: 1 - Symptomatic but completely ambulatory  Blood pressure (!) 153/66, pulse 67, temperature 98.2 F (36.8 C), temperature source Oral, resp. rate 17, height 5\' 10"  (1.778 m), weight 136 lb (61.7 kg), SpO2  99%.  LABORATORY DATA: Lab Results  Component Value Date   WBC 6.3 03/20/2023   HGB 13.5 03/20/2023   HCT 39.5 03/20/2023   MCV 88.4 03/20/2023   PLT 244 03/20/2023      Chemistry      Component Value Date/Time   NA 140 02/27/2023 0940   K 3.8 02/27/2023 0940   CL 105 02/27/2023 0940   CO2 30 02/27/2023 0940   BUN 18 02/27/2023 0940   CREATININE 1.03 02/27/2023 0940   CREATININE 0.69 (L) 01/08/2021 1553      Component Value Date/Time   CALCIUM 9.6 02/27/2023 0940   ALKPHOS 103 02/27/2023 0940   AST 15 02/27/2023 0940   ALT 12 02/27/2023 0940   BILITOT 0.8 02/27/2023 0940       RADIOGRAPHIC STUDIES: No results found.  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  He had evidence for disease progression and metastasis to the lung in February 2023. The patient is currently undergoing treatment with immunotherapy with Libtayo (Cempilimab) 350 Mg IV every 3 weeks status post 27 cycles.  The patient has been tolerating his treatment fairly well.    Stage 4 Squamous Cell Carcinoma of the Skin Undergoing immunotherapy with Libtayo, 27 cycles completed. No new symptoms reported. -Continue immunotherapy as planned.  COVID-19 Tested positive on 03/04/2023. Presented with cough, but no severe symptoms. -Continue monitoring for any new or worsening symptoms.  Vaccinations Inquired about flu, RSV, and COVID booster shots. -Administer all vaccines as needed.  Neuropathy Present in left hand and right foot. Previously managed by Dr. Allena Katz from [facility]. -Continue current management and consider follow-up with Dr. Allena Katz after completion of immunotherapy.  Hypertension Blood pressure slightly  elevated at 160/63. Currently on Losartan and Amlodipine 10mg . -Monitor blood pressure at home. Continue Losartan and Amlodipine 10mg .  Hyperlipidemia On Lipitor. -Continue Lipitor as prescribed.  Diabetes On Metformin. -Continue Metformin as prescribed.  Follow-up in 3 weeks for the next cycle of treatment.   The patient was advised to call immediately if he has any concerning symptoms in the interval. The patient voices understanding of current disease status and treatment options and is in agreement with the current care plan.  All questions were answered. The patient knows to call the clinic with any problems, questions or concerns. We can certainly see the patient much sooner if necessary.  The total time spent in the appointment was 20 minutes.  Disclaimer: This note was dictated with voice recognition software. Similar sounding words can inadvertently be transcribed and may not be corrected upon review.

## 2023-03-20 NOTE — Patient Instructions (Signed)
[facility]  Discharge Instructions: Thank you for choosing [facility] to provide your oncology and hematology care.   If you have a lab appointment with the Cancer Center, please go directly to the Cancer Center and check in at the registration area.   Wear comfortable clothing and clothing appropriate for easy access to any Portacath or PICC line.   We strive to give you quality time with your provider. You may need to reschedule your appointment if you arrive late (15 or more minutes).  Arriving late affects you and other patients whose appointments are after yours.  Also, if you miss three or more appointments without notifying the office, you may be dismissed from the clinic at the provider's discretion.      For prescription refill requests, have your pharmacy contact our office and allow 72 hours for refills to be completed.    Today you received the following chemotherapy and/or immunotherapy agents: Libtayo.       To help prevent nausea and vomiting after your treatment, we encourage you to take your nausea medication as directed.  BELOW ARE SYMPTOMS THAT SHOULD BE REPORTED IMMEDIATELY: *FEVER GREATER THAN 100.4 F (38 C) OR HIGHER *CHILLS OR SWEATING *NAUSEA AND VOMITING THAT IS NOT CONTROLLED WITH YOUR NAUSEA MEDICATION *UNUSUAL SHORTNESS OF BREATH *UNUSUAL BRUISING OR BLEEDING *URINARY PROBLEMS (pain or burning when urinating, or frequent urination) *BOWEL PROBLEMS (unusual diarrhea, constipation, pain near the anus) TENDERNESS IN MOUTH AND THROAT WITH OR WITHOUT PRESENCE OF ULCERS (sore throat, sores in mouth, or a toothache) UNUSUAL RASH, SWELLING OR PAIN  UNUSUAL VAGINAL DISCHARGE OR ITCHING   Items with * indicate a potential emergency and should be followed up as soon as possible or go to the Emergency Department if any problems should occur.  Please show the CHEMOTHERAPY ALERT CARD or IMMUNOTHERAPY ALERT CARD at  check-in to the Emergency Department and triage nurse.  Should you have questions after your visit or need to cancel or reschedule your appointment, please contact [facility]  Dept: [phone]  and follow the prompts.  Office hours are 8:00 a.m. to 4:30 p.m. Monday - Friday. Please note that voicemails left after 4:00 p.m. may not be returned until the following business day.  We are closed weekends and major holidays. You have access to a nurse at all times for urgent questions. Please call the main number to the clinic Dept: [phone] and follow the prompts.   For any non-urgent questions, you may also contact your provider using MyChart. We now offer e-Visits for anyone 18 and older to request care online for non-urgent symptoms. For details visit [email or web address].   Also download the MyChart app! Go to the app store, search "MyChart", open the app, select [place], and log in with your MyChart username and password.   

## 2023-03-21 LAB — T4: T4, Total: 6.4 ug/dL (ref 4.5–12.0)

## 2023-03-26 ENCOUNTER — Other Ambulatory Visit: Payer: Self-pay

## 2023-04-08 ENCOUNTER — Other Ambulatory Visit: Payer: Self-pay | Admitting: Family Medicine

## 2023-04-09 ENCOUNTER — Other Ambulatory Visit: Payer: Self-pay | Admitting: Family Medicine

## 2023-04-10 ENCOUNTER — Inpatient Hospital Stay: Payer: Medicare Other

## 2023-04-10 ENCOUNTER — Inpatient Hospital Stay: Payer: Medicare Other | Admitting: Internal Medicine

## 2023-04-10 ENCOUNTER — Other Ambulatory Visit: Payer: Self-pay

## 2023-04-10 VITALS — BP 146/74 | HR 68 | Resp 18

## 2023-04-10 DIAGNOSIS — C7800 Secondary malignant neoplasm of unspecified lung: Secondary | ICD-10-CM | POA: Diagnosis not present

## 2023-04-10 DIAGNOSIS — C7989 Secondary malignant neoplasm of other specified sites: Secondary | ICD-10-CM

## 2023-04-10 DIAGNOSIS — Z5112 Encounter for antineoplastic immunotherapy: Secondary | ICD-10-CM | POA: Diagnosis not present

## 2023-04-10 DIAGNOSIS — E785 Hyperlipidemia, unspecified: Secondary | ICD-10-CM | POA: Diagnosis not present

## 2023-04-10 DIAGNOSIS — Z79620 Long term (current) use of immunosuppressive biologic: Secondary | ICD-10-CM | POA: Diagnosis not present

## 2023-04-10 DIAGNOSIS — E119 Type 2 diabetes mellitus without complications: Secondary | ICD-10-CM | POA: Diagnosis not present

## 2023-04-10 DIAGNOSIS — I10 Essential (primary) hypertension: Secondary | ICD-10-CM | POA: Diagnosis not present

## 2023-04-10 DIAGNOSIS — C44329 Squamous cell carcinoma of skin of other parts of face: Secondary | ICD-10-CM | POA: Diagnosis not present

## 2023-04-10 LAB — CBC WITH DIFFERENTIAL (CANCER CENTER ONLY)
Abs Immature Granulocytes: 0.01 10*3/uL (ref 0.00–0.07)
Basophils Absolute: 0 10*3/uL (ref 0.0–0.1)
Basophils Relative: 0 %
Eosinophils Absolute: 0.4 10*3/uL (ref 0.0–0.5)
Eosinophils Relative: 7 %
HCT: 38.8 % — ABNORMAL LOW (ref 39.0–52.0)
Hemoglobin: 13.3 g/dL (ref 13.0–17.0)
Immature Granulocytes: 0 %
Lymphocytes Relative: 19 %
Lymphs Abs: 1 10*3/uL (ref 0.7–4.0)
MCH: 30 pg (ref 26.0–34.0)
MCHC: 34.3 g/dL (ref 30.0–36.0)
MCV: 87.6 fL (ref 80.0–100.0)
Monocytes Absolute: 0.4 10*3/uL (ref 0.1–1.0)
Monocytes Relative: 8 %
Neutro Abs: 3.4 10*3/uL (ref 1.7–7.7)
Neutrophils Relative %: 66 %
Platelet Count: 215 10*3/uL (ref 150–400)
RBC: 4.43 MIL/uL (ref 4.22–5.81)
RDW: 13.4 % (ref 11.5–15.5)
WBC Count: 5.1 10*3/uL (ref 4.0–10.5)
nRBC: 0 % (ref 0.0–0.2)

## 2023-04-10 LAB — CMP (CANCER CENTER ONLY)
ALT: 16 U/L (ref 0–44)
AST: 20 U/L (ref 15–41)
Albumin: 3.6 g/dL (ref 3.5–5.0)
Alkaline Phosphatase: 92 U/L (ref 38–126)
Anion gap: 9 (ref 5–15)
BUN: 19 mg/dL (ref 8–23)
CO2: 25 mmol/L (ref 22–32)
Calcium: 8.9 mg/dL (ref 8.9–10.3)
Chloride: 104 mmol/L (ref 98–111)
Creatinine: 0.88 mg/dL (ref 0.61–1.24)
GFR, Estimated: >60 mL/min (ref >60)
Glucose, Bld: 194 mg/dL — ABNORMAL HIGH (ref 70–99)
Potassium: 3.8 mmol/L (ref 3.5–5.1)
Sodium: 138 mmol/L (ref 135–145)
Total Bilirubin: 0.6 mg/dL (ref 0.3–1.2)
Total Protein: 6.5 g/dL (ref 6.5–8.1)

## 2023-04-10 LAB — TSH: TSH: 2.601 u[IU]/mL (ref 0.350–4.500)

## 2023-04-10 MED ORDER — SODIUM CHLORIDE 0.9 % IV SOLN: Freq: Once | INTRAVENOUS | Status: AC

## 2023-04-10 MED ORDER — CEMIPLIMAB-RWLC CHEMO INJECTION 350 MG/7ML
350.0000 mg | Freq: Once | INTRAVENOUS | Status: AC
  Administered 2023-04-10: 350 mg via INTRAVENOUS
  Filled 2023-04-10: qty 7

## 2023-04-10 NOTE — Progress Notes (Signed)
[facility] [phone]) [phone]   Fax:(336) [phone]  OFFICE PROGRESS NOTE  [name], MD [address] 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 He initially presented with skin cancer around the parotid gland in April 2022.   PRIOR THERAPY: 1) status post left parotidectomy with skin resection and rotational flap completed by Dr. Pollyann Kennedy in April 2022. Final pathology showed squamous cell carcinoma.  2) status post radiation therapy under the care of Dr. Basilio Cairo completing 33 fractions on December 19, 2020 for a total of 66 Gray   CURRENT THERAPY: Libtayo (Cempilimab) 350 Mg IV every 3 weeks started August 10, 2021.  Status post 27 cycles of treatment.  INTERVAL HISTORY: Shaun Mcmillan 82 y.o. male returns to the clinic today for follow-up visit accompanied by his wife.Discussed the use of AI scribe software for clinical note transcription with the patient, who gave verbal consent to proceed.  History of Present Illness   The patient, an 82 year old individual with a diagnosis of squamous cell carcinoma of the skin, has been undergoing immunotherapy treatment with Libtayo every three weeks. He has completed 28 cycles of this treatment regimen without any significant complaints or adverse effects. He denies experiencing any nausea, vomiting, chest pain, shortness of breath, or cough. The patient's weight has remained stable, with a slight gain reported since the last visit.  The patient's blood pressure readings have been slightly elevated, but he does not monitor it at home. He is currently on two antihypertensive medications, Losartan and Amlodipine. Despite the elevated readings, the patient has not reported any symptoms related to hypertension.  The patient's overall condition appears to be stable on the current treatment regimen, with no new symptoms or complaints  reported. The patient's lab results have been satisfactory, indicating a good response to the ongoing treatment.       MEDICAL HISTORY: Past Medical History:  Diagnosis Date   Arthritis    Diabetes mellitus type II    Glaucoma    sees Dr. 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  MEDICATIONS:  Current Outpatient Medications  Medication Sig Dispense Refill   acetaminophen (TYLENOL) 500 MG tablet Take 1,000 mg by mouth every 8 (eight) hours as needed for moderate pain. (Patient not taking: Reported on 03/20/2023)     amLODipine (NORVASC) 10 MG tablet TAKE 1 TABLET BY MOUTH DAILY 100 tablet 2   aspirin EC 81 MG tablet Take 1 tablet (81 mg total) by mouth daily. Swallow whole. 30 tablet 11   atorvastatin (LIPITOR) 40 MG tablet TAKE 1 TABLET BY MOUTH DAILY 100 tablet 2   Cholecalciferol (VITAMIN D PO) Take 5,000 Units by mouth daily.     gabapentin (NEURONTIN) 300 MG capsule TAKE 2 CAPSULES BY MOUTH 3 TIMES A DAY (Patient not taking: Reported on 03/20/2023) 180 capsule 1   latanoprost (XALATAN) 0.005 % ophthalmic solution Place 1 drop into both eyes every morning. [facility]  [address]     LORazepam (ATIVAN) 1 MG tablet Take 1 tablet (1 mg total) by mouth 3 (three) times daily as needed for  anxiety. (Patient not taking: Reported on 03/20/2023) 60 tablet 5   losartan (COZAAR) 25 MG tablet TAKE 1 TABLET BY MOUTH DAILY 100 tablet 2   Melatonin 5 MG TABS Take 5 mg by mouth at bedtime. [facility] [address]     metFORMIN (GLUCOPHAGE) 500 MG tablet TAKE 1 TABLET BY MOUTH TWICE  DAILY WITH A MEAL 200 tablet 2   Multiple Vitamin (MULTIVITAMIN) tablet Take 1 tablet by mouth daily.     prochlorperazine (COMPAZINE) 10 MG tablet Take 1 tablet (10 mg total) by mouth every 6 (six) hours as needed  for nausea or vomiting. (Patient not taking: Reported on 03/20/2023) 30 tablet 0   timolol (BETIMOL) 0.5 % ophthalmic solution Place 1 drop into both eyes daily. [facility] [address]     No current facility-administered medications for this visit.    SURGICAL HISTORY:  Past Surgical History:  Procedure Laterality Date   APPENDECTOMY     colonscopy  03/12/2016   per Dr. Ewing Schlein, benign polyps, repeat in 5 yrs   left craniotomy  11/03/08   per Dr. Marikay Alar for a subdrual hematoma   PAROTIDECTOMY Left 09/20/2020   Procedure: PAROTIDECTOMY with resection of skin and rotational flap;  Surgeon: Serena Colonel, MD;  Location: [facility] OR;  Service: ENT;  Laterality: Left;   surgery for ocmpound fracture     right leg    REVIEW OF SYSTEMS:  A comprehensive review of systems was negative.   PHYSICAL EXAMINATION: General appearance: alert, cooperative, and no distress Head: Normocephalic, without obvious abnormality, atraumatic Neck: no adenopathy, no JVD, supple, symmetrical, trachea midline, and thyroid not enlarged, symmetric, no tenderness/mass/nodules Lymph nodes: Cervical, supraclavicular, and axillary nodes normal. Resp: clear to auscultation bilaterally Back: symmetric, no curvature. ROM normal. No CVA tenderness. Cardio: regular rate and rhythm, S1, S2 normal, no murmur, click, rub or gallop GI: soft, non-tender; bowel sounds normal; no masses,  no organomegaly Extremities: extremities normal, atraumatic, no cyanosis or edema  ECOG PERFORMANCE STATUS: 1 - Symptomatic but completely ambulatory  Blood pressure (!) 141/76, pulse 60, temperature (!) 97.5 F (36.4 C), temperature source Oral, resp. rate 17, height 5\' 10"  (1.778 m), weight 137 lb 3.2 oz (62.2 kg), SpO2 100%.  LABORATORY DATA: Lab Results  Component Value Date   WBC 5.1 04/10/2023   HGB 13.3 04/10/2023   HCT 38.8 (L) 04/10/2023   MCV 87.6 04/10/2023   PLT 215 04/10/2023      Chemistry      Component Value Date/Time   NA 138  04/10/2023 1027   K 3.8 04/10/2023 1027   CL 104 04/10/2023 1027   CO2 25 04/10/2023 1027   BUN 19 04/10/2023 1027   CREATININE 0.88 04/10/2023 1027   CREATININE 0.69 (L) 01/08/2021 1553      Component Value Date/Time   CALCIUM 8.9 04/10/2023 1027   ALKPHOS 92 04/10/2023 1027   AST 20 04/10/2023 1027   ALT 16 04/10/2023 1027   BILITOT 0.6 04/10/2023 1027       RADIOGRAPHIC STUDIES: No results found.  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  He had evidence for disease progression and metastasis to the lung in February 2023. The patient is currently undergoing treatment with immunotherapy with Libtayo (  Cempilimab) 350 Mg IV every 3 weeks status post 28 cycles.  The patient has been tolerating his treatment fairly well.    Squamous Cell Carcinoma of the Skin Stable on Libtayo immunotherapy with no new complaints or side effects. Completed 28 cycles to date. -Administer cycle 29 of Libtayo today. -Order CT scan of chest, abdomen, and pelvis in 2 weeks to assess treatment response.  Hypertension Blood pressure slightly elevated at 148/62. Patient on Losartan and Amlodipine. -Encourage patient to monitor blood pressure at home. -Continue current antihypertensive medications.  Follow-up Plans -Return in 3 weeks for next appointment. -Coordinate treatment schedule around Christmas holiday.      The patient was advised to call immediately if he has any concerning symptoms in the interval. The patient voices understanding of current disease status and treatment options and is in agreement with the current care plan.  All questions were answered. The patient knows to call the clinic with any problems,  questions or concerns. We can certainly see the patient much sooner if necessary.  The total time spent in the appointment was 20 minutes.  Disclaimer: This note was dictated with voice recognition software. Similar sounding words can inadvertently be transcribed and may not be corrected upon review.

## 2023-04-10 NOTE — Patient Instructions (Signed)
[facility]  Discharge Instructions: Thank you for choosing [facility] to provide your oncology and hematology care.   If you have a lab appointment with the Cancer Center, please go directly to the Cancer Center and check in at the registration area.   Wear comfortable clothing and clothing appropriate for easy access to any Portacath or PICC line.   We strive to give you quality time with your provider. You may need to reschedule your appointment if you arrive late (15 or more minutes).  Arriving late affects you and other patients whose appointments are after yours.  Also, if you miss three or more appointments without notifying the office, you may be dismissed from the clinic at the provider's discretion.      For prescription refill requests, have your pharmacy contact our office and allow 72 hours for refills to be completed.    Today you received the following chemotherapy and/or immunotherapy agents: Libtayo.       To help prevent nausea and vomiting after your treatment, we encourage you to take your nausea medication as directed.  BELOW ARE SYMPTOMS THAT SHOULD BE REPORTED IMMEDIATELY: *FEVER GREATER THAN 100.4 F (38 C) OR HIGHER *CHILLS OR SWEATING *NAUSEA AND VOMITING THAT IS NOT CONTROLLED WITH YOUR NAUSEA MEDICATION *UNUSUAL SHORTNESS OF BREATH *UNUSUAL BRUISING OR BLEEDING *URINARY PROBLEMS (pain or burning when urinating, or frequent urination) *BOWEL PROBLEMS (unusual diarrhea, constipation, pain near the anus) TENDERNESS IN MOUTH AND THROAT WITH OR WITHOUT PRESENCE OF ULCERS (sore throat, sores in mouth, or a toothache) UNUSUAL RASH, SWELLING OR PAIN  UNUSUAL VAGINAL DISCHARGE OR ITCHING   Items with * indicate a potential emergency and should be followed up as soon as possible or go to the Emergency Department if any problems should occur.  Please show the CHEMOTHERAPY ALERT CARD or IMMUNOTHERAPY ALERT CARD at  check-in to the Emergency Department and triage nurse.  Should you have questions after your visit or need to cancel or reschedule your appointment, please contact [facility]  Dept: [phone]  and follow the prompts.  Office hours are 8:00 a.m. to 4:30 p.m. Monday - Friday. Please note that voicemails left after 4:00 p.m. may not be returned until the following business day.  We are closed weekends and major holidays. You have access to a nurse at all times for urgent questions. Please call the main number to the clinic Dept: [phone] and follow the prompts.   For any non-urgent questions, you may also contact your provider using MyChart. We now offer e-Visits for anyone 18 and older to request care online for non-urgent symptoms. For details visit [email or web address].   Also download the MyChart app! Go to the app store, search "MyChart", open the app, select [place], and log in with your MyChart username and password.   

## 2023-04-11 LAB — T4: T4, Total: 6.9 ug/dL (ref 4.5–12.0)

## 2023-04-14 ENCOUNTER — Other Ambulatory Visit: Payer: Self-pay

## 2023-04-24 ENCOUNTER — Other Ambulatory Visit: Payer: Self-pay | Admitting: Internal Medicine

## 2023-04-24 ENCOUNTER — Ambulatory Visit (HOSPITAL_COMMUNITY)
Admission: RE | Admit: 2023-04-24 | Discharge: 2023-04-24 | Disposition: A | Discharge status: Home / Self Care | Payer: Medicare Other | Source: Ambulatory Visit | Attending: Internal Medicine | Admitting: Internal Medicine

## 2023-04-24 DIAGNOSIS — C7989 Secondary malignant neoplasm of other specified sites: Secondary | ICD-10-CM | POA: Diagnosis not present

## 2023-04-24 DIAGNOSIS — R911 Solitary pulmonary nodule: Secondary | ICD-10-CM | POA: Diagnosis not present

## 2023-04-24 DIAGNOSIS — N281 Cyst of kidney, acquired: Secondary | ICD-10-CM | POA: Diagnosis not present

## 2023-04-24 DIAGNOSIS — I700 Atherosclerosis of aorta: Secondary | ICD-10-CM | POA: Diagnosis not present

## 2023-04-24 MED ORDER — IOHEXOL 300 MG/ML  SOLN
100.0000 mL | Freq: Once | INTRAMUSCULAR | Status: AC | PRN
  Administered 2023-04-24: 100 mL via INTRAVENOUS

## 2023-05-01 ENCOUNTER — Inpatient Hospital Stay: Payer: Medicare Other

## 2023-05-01 ENCOUNTER — Inpatient Hospital Stay: Payer: Medicare Other | Attending: Oncology

## 2023-05-01 ENCOUNTER — Encounter: Payer: Self-pay | Admitting: Internal Medicine

## 2023-05-01 ENCOUNTER — Inpatient Hospital Stay: Payer: Medicare Other | Admitting: Internal Medicine

## 2023-05-01 VITALS — BP 153/78 | HR 65 | Temp 98.1°F | Resp 18 | Ht 70.0 in | Wt 135.7 lb

## 2023-05-01 DIAGNOSIS — Z5112 Encounter for antineoplastic immunotherapy: Secondary | ICD-10-CM | POA: Diagnosis not present

## 2023-05-01 DIAGNOSIS — C7989 Secondary malignant neoplasm of other specified sites: Secondary | ICD-10-CM

## 2023-05-01 DIAGNOSIS — C44329 Squamous cell carcinoma of skin of other parts of face: Secondary | ICD-10-CM | POA: Insufficient documentation

## 2023-05-01 DIAGNOSIS — C7800 Secondary malignant neoplasm of unspecified lung: Secondary | ICD-10-CM | POA: Diagnosis not present

## 2023-05-01 DIAGNOSIS — Z79620 Long term (current) use of immunosuppressive biologic: Secondary | ICD-10-CM | POA: Diagnosis not present

## 2023-05-01 LAB — CBC WITH DIFFERENTIAL (CANCER CENTER ONLY)
Abs Immature Granulocytes: 0.01 10*3/uL (ref 0.00–0.07)
Basophils Absolute: 0 10*3/uL (ref 0.0–0.1)
Basophils Relative: 0 %
Eosinophils Absolute: 0.1 10*3/uL (ref 0.0–0.5)
Eosinophils Relative: 2 %
HCT: 40.1 % (ref 39.0–52.0)
Hemoglobin: 13.3 g/dL (ref 13.0–17.0)
Immature Granulocytes: 0 %
Lymphocytes Relative: 13 %
Lymphs Abs: 0.8 10*3/uL (ref 0.7–4.0)
MCH: 29.1 pg (ref 26.0–34.0)
MCHC: 33.2 g/dL (ref 30.0–36.0)
MCV: 87.7 fL (ref 80.0–100.0)
Monocytes Absolute: 0.4 10*3/uL (ref 0.1–1.0)
Monocytes Relative: 6 %
Neutro Abs: 4.8 10*3/uL (ref 1.7–7.7)
Neutrophils Relative %: 79 %
Platelet Count: 215 10*3/uL (ref 150–400)
RBC: 4.57 MIL/uL (ref 4.22–5.81)
RDW: 13.6 % (ref 11.5–15.5)
WBC Count: 6.2 10*3/uL (ref 4.0–10.5)
nRBC: 0 % (ref 0.0–0.2)

## 2023-05-01 LAB — CMP (CANCER CENTER ONLY)
ALT: 13 U/L (ref 0–44)
AST: 16 U/L (ref 15–41)
Albumin: 4 g/dL (ref 3.5–5.0)
Alkaline Phosphatase: 97 U/L (ref 38–126)
Anion gap: 5 (ref 5–15)
BUN: 16 mg/dL (ref 8–23)
CO2: 30 mmol/L (ref 22–32)
Calcium: 9.5 mg/dL (ref 8.9–10.3)
Chloride: 105 mmol/L (ref 98–111)
Creatinine: 0.92 mg/dL (ref 0.61–1.24)
GFR, Estimated: >60 mL/min (ref >60)
Glucose, Bld: 181 mg/dL — ABNORMAL HIGH (ref 70–99)
Potassium: 4.1 mmol/L (ref 3.5–5.1)
Sodium: 140 mmol/L (ref 135–145)
Total Bilirubin: 0.6 mg/dL (ref <1.2)
Total Protein: 6.9 g/dL (ref 6.5–8.1)

## 2023-05-01 LAB — TSH: TSH: 3.683 u[IU]/mL (ref 0.350–4.500)

## 2023-05-01 MED ORDER — CEMIPLIMAB-RWLC CHEMO INJECTION 350 MG/7ML
350.0000 mg | Freq: Once | INTRAVENOUS | Status: AC
  Administered 2023-05-01: 350 mg via INTRAVENOUS
  Filled 2023-05-01: qty 7

## 2023-05-01 MED ORDER — SODIUM CHLORIDE 0.9 % IV SOLN: Freq: Once | INTRAVENOUS | Status: AC

## 2023-05-01 NOTE — Progress Notes (Signed)
[facility] Telephone:([phone]) [phone]   [phone]  OFFICE PROGRESS NOTE  [name], MD [address] 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 He initially presented with skin cancer around the parotid gland in April 2022.   PRIOR THERAPY: 1) status post left parotidectomy with skin resection and rotational flap completed by Dr. Pollyann Kennedy in April 2022. Final pathology showed squamous cell carcinoma.  2) status post radiation therapy under the care of Dr. Basilio Cairo completing 33 fractions on December 19, 2020 for a total of 66 Gray   CURRENT THERAPY: Libtayo (Cempilimab) 350 Mg IV every 3 weeks started August 10, 2021.  Status post 29 cycles of treatment.  INTERVAL HISTORY: Shaun Mcmillan 82 y.o. male returns to the clinic today for follow-up visit accompanied by his wife. Discussed the use of AI scribe software for clinical note transcription with the patient, who gave verbal consent to proceed.  History of Present Illness   The patient, an 82 year old diagnosed with stage four squamous cell carcinoma of the skin in April 2022, has been undergoing immunotherapy with Libtayo every three weeks. He has completed twenty-nine cycles of this treatment. Recently, he underwent a CT scan of the chest, where most of the nodules were located. The scan revealed a solid pulmonary nodule in the lingual measuring 3-4 millimeters, which has remained unchanged in size. The patient has not experienced any new side effects from the immunotherapy, denying nausea, vomiting, diarrhea, chest pain, or breathing issues. He has noticed a slight weight loss of approximately two pounds since the last visit.        MEDICAL HISTORY: Past Medical History:  Diagnosis Date   Arthritis    Diabetes mellitus type II    Glaucoma    sees Dr. 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  MEDICATIONS:  Current Outpatient Medications  Medication Sig Dispense Refill   acetaminophen (TYLENOL) 500 MG tablet Take 1,000 mg by mouth every 8 (eight) hours as needed for moderate pain. (Patient not taking: Reported on 03/20/2023)     amLODipine (NORVASC) 10 MG tablet TAKE 1 TABLET BY MOUTH DAILY 100 tablet 2   aspirin EC 81 MG tablet Take 1 tablet (81 mg total) by mouth daily. Swallow whole. 30 tablet 11   atorvastatin (LIPITOR) 40 MG tablet TAKE 1 TABLET BY MOUTH DAILY 100 tablet 2   Cholecalciferol (VITAMIN D PO) Take 5,000 Units by mouth daily.     gabapentin (NEURONTIN) 300 MG capsule TAKE 2 CAPSULES BY MOUTH 3 TIMES A DAY (Patient not taking: Reported on 03/20/2023) 180 capsule 1   latanoprost (XALATAN) 0.005 % ophthalmic solution Place 1 drop into both eyes every morning. [facility]  [address]     LORazepam (ATIVAN) 1 MG tablet Take 1 tablet (1 mg total) by mouth 3 (three) times daily as needed for anxiety. (Patient not taking: Reported on 03/20/2023) 60 tablet 5   losartan (COZAAR) 25 MG tablet TAKE 1 TABLET BY MOUTH DAILY 100 tablet 2   Melatonin 5 MG TABS Take 5 mg by mouth at bedtime. [facility]  [address]     metFORMIN (GLUCOPHAGE) 500 MG tablet TAKE 1 TABLET BY MOUTH TWICE  DAILY WITH A MEAL 200 tablet 2   Multiple Vitamin (MULTIVITAMIN) tablet Take 1 tablet by mouth daily.     prochlorperazine (COMPAZINE) 10 MG tablet Take 1 tablet (10 mg total) by mouth every 6 (six) hours as needed for nausea or vomiting. (Patient not taking: Reported on 03/20/2023) 30 tablet 0   timolol (BETIMOL) 0.5 % ophthalmic solution Place 1 drop into both eyes daily. [facility] [address]     No current facility-administered medications for this visit.    SURGICAL HISTORY:  Past Surgical History:  Procedure  Laterality Date   APPENDECTOMY     colonscopy  03/12/2016   per Dr. Ewing Schlein, benign polyps, repeat in 5 yrs   left craniotomy  11/03/08   per Dr. Marikay Alar for a subdrual hematoma   PAROTIDECTOMY Left 09/20/2020   Procedure: PAROTIDECTOMY with resection of skin and rotational flap;  Surgeon: Serena Colonel, MD;  Location: [facility] OR;  Service: ENT;  Laterality: Left;   surgery for ocmpound fracture     right leg    REVIEW OF SYSTEMS:  Constitutional: positive for fatigue Eyes: negative Ears, nose, mouth, throat, and face: negative Respiratory: negative Cardiovascular: negative Gastrointestinal: negative Genitourinary:negative Integument/breast: negative Hematologic/lymphatic: negative Musculoskeletal:negative Neurological: negative Behavioral/Psych: negative Endocrine: negative Allergic/Immunologic: negative   PHYSICAL EXAMINATION: General appearance: alert, cooperative, and no distress Head: Normocephalic, without obvious abnormality, atraumatic Neck: no adenopathy, no JVD, supple, symmetrical, trachea midline, and thyroid not enlarged, symmetric, no tenderness/mass/nodules Lymph nodes: Cervical, supraclavicular, and axillary nodes normal. Resp: clear to auscultation bilaterally Back: symmetric, no curvature. ROM normal. No CVA tenderness. Cardio: regular rate and rhythm, S1, S2 normal, no murmur, click, rub or gallop GI: soft, non-tender; bowel sounds normal; no masses,  no organomegaly Extremities: extremities normal, atraumatic, no cyanosis or edema Neurologic: Alert and oriented X 3, normal strength and tone. Normal symmetric reflexes. Normal coordination and gait  ECOG PERFORMANCE STATUS: 1 - Symptomatic but completely ambulatory  Blood pressure (!) 153/78, pulse 65, temperature 98.1 F (36.7 C), temperature source Temporal, resp. rate 18, height 5\' 10"  (1.778 m), weight 135 lb 11.2 oz (61.6 kg), SpO2 100%.  LABORATORY DATA: Lab Results  Component Value Date   WBC 6.2  05/01/2023   HGB 13.3 05/01/2023   HCT 40.1 05/01/2023   MCV 87.7 05/01/2023   PLT 215 05/01/2023      Chemistry      Component Value Date/Time   NA 140 05/01/2023 1018   K 4.1 05/01/2023 1018   CL 105 05/01/2023 1018   CO2 30 05/01/2023 1018   BUN 16 05/01/2023 1018   CREATININE 0.92 05/01/2023 1018   CREATININE 0.69 (L) 01/08/2021 1553      Component Value Date/Time   CALCIUM 9.5 05/01/2023 1018   ALKPHOS 97 05/01/2023 1018   AST 16 05/01/2023 1018   ALT 13 05/01/2023 1018   BILITOT 0.6 05/01/2023 1018       RADIOGRAPHIC STUDIES: CT CHEST ABDOMEN PELVIS W CONTRAST  Result Date: 04/24/2023 CLINICAL DATA:  History of skin cancer, 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 Follow-up. * Tracking Code: BO * EXAM: CT CHEST, ABDOMEN, AND PELVIS WITH CONTRAST TECHNIQUE: Multidetector CT imaging of the chest, abdomen and pelvis was performed following the standard protocol during bolus administration of intravenous contrast. RADIATION DOSE REDUCTION: This exam was performed according to the departmental dose-optimization program which includes automated exposure control, adjustment  of the mA and/or kV according to patient size and/or use of iterative reconstruction technique. CONTRAST:  OMNIPAQUE IOHEXOL 300 MG/ML  SOLN COMPARISON:  Multiple priors including most recent CT January 14, 2023 FINDINGS: CT CHEST FINDINGS Cardiovascular: Aortic and branch vessel atherosclerosis. No central pulmonary embolus on this nondedicated study. Three-vessel coronary artery calcifications. Normal size heart. No significant pericardial effusion/thickening. Mediastinum/Nodes: No suspicious thyroid nodule. No pathologically enlarged mediastinal, hilar or axillary lymph nodes. Three-vessel coronary artery calcifications. The esophagus is grossly unremarkable. Lungs/Pleura: Solid pulmonary nodule in the lingula measures 3 mm on image 74/4 previously 4 mm. No new suspicious pulmonary nodules or  masses. Atelectasis/scarring in the lung bases. Musculoskeletal: No aggressive lytic or blastic lesion of bone. Multilevel degenerative changes spine. Degenerative change of the bilateral shoulders. CT ABDOMEN PELVIS FINDINGS Hepatobiliary: No suspicious hepatic lesion. Gallbladder is unremarkable. No biliary ductal dilation. Pancreas: No pancreatic ductal dilation or evidence of acute inflammation. Spleen: No splenomegaly. Adrenals/Urinary Tract: Adrenal glands appear normal. No hydronephrosis. 5.7 cm right renal cyst is considered benign requiring no independent imaging follow-up. Urinary bladder is unremarkable for degree of distension. Stomach/Bowel: No radiopaque enteric contrast material was administered. Stomach is minimally distended limiting evaluation. No pathologic dilation of small or large bowel. No evidence of acute bowel inflammation. Vascular/Lymphatic: Aortic atherosclerosis. Smooth IVC contours. The portal, splenic and superior mesenteric veins are patent. No pathologically enlarged abdominal or pelvic lymph nodes. Reproductive: Prostate is unremarkable. Other: No significant abdominopelvic free fluid. Musculoskeletal: No aggressive lytic or blastic lesion of bone. Multilevel degenerative change of the spine. IMPRESSION: 1. No evidence of new or progressive disease in the chest, abdomen or pelvis. 2. Solid pulmonary nodule in the lingula measures 3 mm previously 4 mm, nonspecific but favored benign. 3.  Aortic Atherosclerosis (ICD10-I70.0). Electronically Signed   By: Maudry Mayhew M.D.   On: 04/24/2023 17:20    ASSESSMENT AND PLAN: This is a very pleasant 82 years old white male with stage IV squamous cell carcinoma of the skin with pulmonary metastasis initially diagnosed in April 2022 with involvement of the skin around the left parotid gland status post left parotidectomy with skin resection followed by adjuvant radiotherapy.  He had evidence for disease progression and metastasis to the  lung in February 2023. The patient is currently undergoing treatment with immunotherapy with Libtayo (Cempilimab) 350 Mg IV every 3 weeks status post 29 cycles.  The patient has been tolerating his treatment fairly well.  He had repeat CT scan of the chest, abdomen and pelvis performed recently.  I personally and independently reviewed the scan and discussed the result with the patient today. His scan showed no concerning findings for disease recurrence or metastasis.    Stage IV Squamous Cell Carcinoma of the Skin Diagnosed April 2022. Undergoing immunotherapy with Libtayo every three weeks, completed 29 cycles. Recent chest CT showed a stable 3-4 mm solid pulmonary nodule in the lingula, considered benign due to stability, but malignancy cannot be ruled out without biopsy. No new side effects from immunotherapy. No symptoms such as nausea, vomiting, diarrhea, chest pain, dyspnea, or cough. Slight weight loss of 1.5-2 pounds since last visit. Discussed nodule's stability might be due to ongoing treatment; without treatment, stability over a year or two would suggest benign nature. - Continue Libtayo every three weeks - Perform head and neck CT scan - Monitor weight and nutritional intake - Follow-up in three weeks  General Health Maintenance Emphasized monitoring for immunotherapy side effects and ensuring adequate nutritional intake. -  Monitor for new immunotherapy side effects - Encourage adequate nutritional intake to prevent further weight loss  Follow-up - Follow-up in three weeks - Review head and neck CT scan results at next visit.   The patient was advised to call immediately if she has any concerning symptoms in the interval. The patient voices understanding of current disease status and treatment options and is in agreement with the current care plan.  All questions were answered. The patient knows to call the clinic with any problems, questions or concerns. We can certainly see the  patient much sooner if necessary.  The total time spent in the appointment was 30 minutes.  Disclaimer: This note was dictated with voice recognition software. Similar sounding words can inadvertently be transcribed and may not be corrected upon review.

## 2023-05-01 NOTE — Patient Instructions (Signed)
 [facility] - A DEPT OF [facility]  Discharge Instructions: Thank you for choosing [facility] to provide your oncology and hematology care.   If you have a lab appointment with the Cancer Center, please go directly to the Cancer Center and check in at the registration area.   Wear comfortable clothing and clothing appropriate for easy access to any Portacath or PICC line.   We strive to give you quality time with your provider. You may need to reschedule your appointment if you arrive late (15 or more minutes).  Arriving late affects you and other patients whose appointments are after yours.  Also, if you miss three or more appointments without notifying the office, you may be dismissed from the clinic at the provider's discretion.      For prescription refill requests, have your pharmacy contact our office and allow 72 hours for refills to be completed.    Today you received the following chemotherapy and/or immunotherapy agents : Libtayo      To help prevent nausea and vomiting after your treatment, we encourage you to take your nausea medication as directed.  BELOW ARE SYMPTOMS THAT SHOULD BE REPORTED IMMEDIATELY: *FEVER GREATER THAN 100.4 F (38 C) OR HIGHER *CHILLS OR SWEATING *NAUSEA AND VOMITING THAT IS NOT CONTROLLED WITH YOUR NAUSEA MEDICATION *UNUSUAL SHORTNESS OF BREATH *UNUSUAL BRUISING OR BLEEDING *URINARY PROBLEMS (pain or burning when urinating, or frequent urination) *BOWEL PROBLEMS (unusual diarrhea, constipation, pain near the anus) TENDERNESS IN MOUTH AND THROAT WITH OR WITHOUT PRESENCE OF ULCERS (sore throat, sores in mouth, or a toothache) UNUSUAL RASH, SWELLING OR PAIN  UNUSUAL VAGINAL DISCHARGE OR ITCHING   Items with * indicate a potential emergency and should be followed up as soon as possible or go to the Emergency Department if any problems should occur.  Please show the CHEMOTHERAPY ALERT CARD or IMMUNOTHERAPY  ALERT CARD at check-in to the Emergency Department and triage nurse.  Should you have questions after your visit or need to cancel or reschedule your appointment, please contact [facility] - A DEPT OF [facility]  Dept: [phone]  and follow the prompts.  Office hours are 8:00 a.m. to 4:30 p.m. Monday - Friday. Please note that voicemails left after 4:00 p.m. may not be returned until the following business day.  We are closed weekends and major holidays. You have access to a nurse at all times for urgent questions. Please call the main number to the clinic Dept: [phone] and follow the prompts.   For any non-urgent questions, you may also contact your provider using MyChart. We now offer e-Visits for anyone 96 and older to request care online for non-urgent symptoms. For details visit [email or web address].   Also download the MyChart app! Go to the app store, search "MyChart", open the app, select [facility], and log in with your MyChart username and password.

## 2023-05-02 LAB — T4: T4, Total: 6.3 ug/dL (ref 4.5–12.0)

## 2023-05-07 ENCOUNTER — Other Ambulatory Visit: Payer: Self-pay | Admitting: Neurology

## 2023-05-07 ENCOUNTER — Other Ambulatory Visit: Payer: Self-pay

## 2023-05-18 NOTE — Progress Notes (Unsigned)
[facility] OFFICE PROGRESS NOTE  [name], MD [address] 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 He initially presented with skin cancer around the parotid gland in April 2022   PRIOR THERAPY: 1) status post left parotidectomy with skin resection and rotational flap completed by Dr. Pollyann Kennedy in April 2022. Final pathology showed squamous cell carcinoma.  2) status post radiation therapy under the care of Dr. Basilio Cairo completing 33 fractions on December 19, 2020 for a total of 66 Gray   CURRENT THERAPY: Libtayo (Cempilimab) 350 Mg IV every 3 weeks started August 10, 2021.  Status post 30 cycles of treatment.   INTERVAL HISTORY: Shaun Mcmillan 82 y.o. male returns to the clinic today for a follow-up visit. The patient was last seen 3 weeks ago by Dr. Arbutus Ped. The patient is currently undergoing immunotherapy with Libtayo every 3 weeks. He tolerates it well without any concerning adverse side effects. oday he denies any fever, chills, or night sweats. He does not always have very good appetite.     ***last scan had enlargin nodule.   He sees ENT and had a swallow study performed by a speech therapist for his aspiration/dysphagia. They told him to tuck his chin to the right when he swallows which has helped. Denies any chest pain, shortness of breath, or hemoptysis.  Denies any significant cough.  Denies any nausea, vomiting, diarrhea, or constipation.  Denies any headache or visual changes.  Denies any rashes or skin changes. He saw Dr. Yetta Barre from dermatology on ~11/28/22. He had a few places on his skin frozen off. Dr. Yetta Barre monitors him closely every 2-3 months.  CT of the neck ***. He is here today for evaluation and repeat blood work before undergoing cycle #31.       MEDICAL HISTORY: Past Medical History:  Diagnosis Date   Arthritis    Diabetes mellitus type II    Glaucoma     sees Dr. 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  MEDICATIONS:  Current Outpatient Medications  Medication Sig Dispense Refill   acetaminophen (TYLENOL) 500 MG tablet Take 1,000 mg by mouth every 8 (eight) hours as needed for moderate pain. (Patient not taking: Reported on 03/20/2023)     amLODipine (NORVASC) 10 MG tablet TAKE 1 TABLET BY MOUTH DAILY 100 tablet 2   aspirin EC 81 MG tablet Take 1 tablet (81 mg total) by mouth daily. Swallow whole. 30 tablet 11   atorvastatin (LIPITOR) 40 MG tablet TAKE 1 TABLET BY MOUTH DAILY 100 tablet 2   Cholecalciferol (VITAMIN D PO) Take 5,000 Units by mouth daily.     gabapentin (NEURONTIN) 300 MG capsule TAKE 2 CAPSULES BY MOUTH 3 TIMES A DAY (Patient not taking: Reported on 03/20/2023) 180 capsule 1   latanoprost (XALATAN) 0.005 % ophthalmic solution Place 1 drop into both eyes every morning. [facility]  [address]     LORazepam (ATIVAN) 1 MG tablet Take 1 tablet (1 mg total) by mouth 3 (three) times daily as needed for anxiety. (Patient not taking: Reported on 03/20/2023) 60 tablet 5  losartan (COZAAR) 25 MG tablet TAKE 1 TABLET BY MOUTH DAILY 100 tablet 2   Melatonin 5 MG TABS Take 5 mg by mouth at bedtime. [facility] [address]     metFORMIN (GLUCOPHAGE) 500 MG tablet TAKE 1 TABLET BY MOUTH TWICE  DAILY WITH A MEAL 200 tablet 2   Multiple Vitamin (MULTIVITAMIN) tablet Take 1 tablet by mouth daily.     prochlorperazine (COMPAZINE) 10 MG tablet Take 1 tablet (10 mg total) by mouth every 6 (six) hours as needed for nausea or vomiting. (Patient not taking: Reported on 03/20/2023) 30 tablet 0   timolol (BETIMOL) 0.5 % ophthalmic solution Place 1 drop into both eyes daily. [facility] [address]     No current facility-administered medications for this  visit.    SURGICAL HISTORY:  Past Surgical History:  Procedure Laterality Date   APPENDECTOMY     colonscopy  03/12/2016   per Dr. Ewing Schlein, benign polyps, repeat in 5 yrs   left craniotomy  11/03/08   per Dr. Marikay Alar for a subdrual hematoma   PAROTIDECTOMY Left 09/20/2020   Procedure: PAROTIDECTOMY with resection of skin and rotational flap;  Surgeon: Serena Colonel, MD;  Location: [facility] OR;  Service: ENT;  Laterality: Left;   surgery for ocmpound fracture     right leg    REVIEW OF SYSTEMS:   Review of Systems  Constitutional: Negative for appetite change, chills, fatigue, fever and unexpected weight change.  HENT:   Negative for mouth sores, nosebleeds, sore throat and trouble swallowing.   Eyes: Negative for eye problems and icterus.  Respiratory: Negative for cough, hemoptysis, shortness of breath and wheezing.   Cardiovascular: Negative for chest pain and leg swelling.  Gastrointestinal: Negative for abdominal pain, constipation, diarrhea, nausea and vomiting.  Genitourinary: Negative for bladder incontinence, difficulty urinating, dysuria, frequency and hematuria.   Musculoskeletal: Negative for back pain, gait problem, neck pain and neck stiffness.  Skin: Negative for itching and rash.  Neurological: Negative for dizziness, extremity weakness, gait problem, headaches, light-headedness and seizures.  Hematological: Negative for adenopathy. Does not bruise/bleed easily.  Psychiatric/Behavioral: Negative for confusion, depression and sleep disturbance. The patient is not nervous/anxious.     PHYSICAL EXAMINATION:  There were no vitals taken for this visit.  ECOG PERFORMANCE STATUS: {CHL ONC ECOG [id number]  Physical Exam  Constitutional: Oriented to person, place, and time and well-developed, well-nourished, and in no distress. No distress.  HENT:  Head: Normocephalic and atraumatic.  Mouth/Throat: Oropharynx is clear and moist. No oropharyngeal exudate.  Eyes:  Conjunctivae are normal. Right eye exhibits no discharge. Left eye exhibits no discharge. No scleral icterus.  Neck: Normal range of motion. Neck supple.  Cardiovascular: Normal rate, regular rhythm, normal heart sounds and intact distal pulses.   Pulmonary/Chest: Effort normal and breath sounds normal. No respiratory distress. No wheezes. No rales.  Abdominal: Soft. Bowel sounds are normal. Exhibits no distension and no mass. There is no tenderness.  Musculoskeletal: Normal range of motion. Exhibits no edema.  Lymphadenopathy:    No cervical adenopathy.  Neurological: Alert and oriented to person, place, and time. Exhibits normal muscle tone. Gait normal. Coordination normal.  Skin: Skin is warm and dry. No rash noted. Not diaphoretic. No erythema. No pallor.  Psychiatric: Mood, memory and judgment normal.  Vitals reviewed.  LABORATORY DATA: Lab Results  Component Value Date   WBC 6.2 05/01/2023   HGB 13.3 05/01/2023   HCT 40.1 05/01/2023   MCV 87.7 05/01/2023   PLT 215 05/01/2023  Chemistry      Component Value Date/Time   NA 140 05/01/2023 1018   K 4.1 05/01/2023 1018   CL 105 05/01/2023 1018   CO2 30 05/01/2023 1018   BUN 16 05/01/2023 1018   CREATININE 0.92 05/01/2023 1018   CREATININE 0.69 (L) 01/08/2021 1553      Component Value Date/Time   CALCIUM 9.5 05/01/2023 1018   ALKPHOS 97 05/01/2023 1018   AST 16 05/01/2023 1018   ALT 13 05/01/2023 1018   BILITOT 0.6 05/01/2023 1018       RADIOGRAPHIC STUDIES:  CT CHEST ABDOMEN PELVIS W CONTRAST  Result Date: 04/24/2023 CLINICAL DATA:  History of skin cancer, 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 Follow-up. * Tracking Code: BO * EXAM: CT CHEST, ABDOMEN, AND PELVIS WITH CONTRAST TECHNIQUE: Multidetector CT imaging of the chest, abdomen and pelvis was performed following the standard protocol during bolus administration of intravenous contrast. RADIATION DOSE REDUCTION: This exam was performed according  to the departmental dose-optimization program which includes automated exposure control, adjustment of the mA and/or kV according to patient size and/or use of iterative reconstruction technique. CONTRAST:  OMNIPAQUE IOHEXOL 300 MG/ML  SOLN COMPARISON:  Multiple priors including most recent CT January 14, 2023 FINDINGS: CT CHEST FINDINGS Cardiovascular: Aortic and branch vessel atherosclerosis. No central pulmonary embolus on this nondedicated study. Three-vessel coronary artery calcifications. Normal size heart. No significant pericardial effusion/thickening. Mediastinum/Nodes: No suspicious thyroid nodule. No pathologically enlarged mediastinal, hilar or axillary lymph nodes. Three-vessel coronary artery calcifications. The esophagus is grossly unremarkable. Lungs/Pleura: Solid pulmonary nodule in the lingula measures 3 mm on image 74/4 previously 4 mm. No new suspicious pulmonary nodules or masses. Atelectasis/scarring in the lung bases. Musculoskeletal: No aggressive lytic or blastic lesion of bone. Multilevel degenerative changes spine. Degenerative change of the bilateral shoulders. CT ABDOMEN PELVIS FINDINGS Hepatobiliary: No suspicious hepatic lesion. Gallbladder is unremarkable. No biliary ductal dilation. Pancreas: No pancreatic ductal dilation or evidence of acute inflammation. Spleen: No splenomegaly. Adrenals/Urinary Tract: Adrenal glands appear normal. No hydronephrosis. 5.7 cm right renal cyst is considered benign requiring no independent imaging follow-up. Urinary bladder is unremarkable for degree of distension. Stomach/Bowel: No radiopaque enteric contrast material was administered. Stomach is minimally distended limiting evaluation. No pathologic dilation of small or large bowel. No evidence of acute bowel inflammation. Vascular/Lymphatic: Aortic atherosclerosis. Smooth IVC contours. The portal, splenic and superior mesenteric veins are patent. No pathologically enlarged abdominal or pelvic  lymph nodes. Reproductive: Prostate is unremarkable. Other: No significant abdominopelvic free fluid. Musculoskeletal: No aggressive lytic or blastic lesion of bone. Multilevel degenerative change of the spine. IMPRESSION: 1. No evidence of new or progressive disease in the chest, abdomen or pelvis. 2. Solid pulmonary nodule in the lingula measures 3 mm previously 4 mm, nonspecific but favored benign. 3.  Aortic Atherosclerosis (ICD10-I70.0). Electronically Signed   By: Maudry Mayhew M.D.   On: 04/24/2023 17:20     ASSESSMENT/PLAN:  This is a very pleasant 82 year old Caucasian male with stage IV squamous cell carcinoma of the skin with pulmonary metastases.  He was initially diagnosed in April 2022 with involvement of the skin around the left parotid gland status post left parotidectomy with skin resection followed by adjuvant radiation.  He then had evidence of disease progression with metastases to the lung in February 2023.   The patient is currently undergoing systemic treatment with immunotherapy with Libtayo 350 mg IV every 3 weeks.  He is status post 30 cycles.  The plan is  to complete 2 years of therapy as long as he is tolerating well and there is no evidence of disease progression or unacceptable toxicity.   Labs reviewed.  Recommend that he proceed cycle #31 today   He will continue to follow with dermatology.    He will also continue to follow with ENT and speech therapy regarding his aspiration. I have refilled his nystatin. Also encouraged drinking plenty of fluids, salt water rinses, and biotene  The patient was advised to call immediately if he has any concerning symptoms in the interval. The patient voices understanding of current disease status and treatment options and is in agreement with the current care plan. All questions were answered. The patient knows to call the clinic with any problems, questions or concerns. We can certainly see the patient much sooner if  necessary    No orders of the defined types were placed in this encounter.    I spent {CHL ONC TIME VISIT - XBJYN:[id number]} counseling the patient face to face. The total time spent in the appointment was {CHL ONC TIME VISIT - MVHQI:[id number]}.  Levana Minetti L Dhruva Orndoff, PA-C 05/18/23

## 2023-05-22 ENCOUNTER — Inpatient Hospital Stay: Payer: Medicare Other | Attending: Oncology

## 2023-05-22 ENCOUNTER — Inpatient Hospital Stay: Payer: Medicare Other

## 2023-05-22 ENCOUNTER — Inpatient Hospital Stay: Payer: Medicare Other | Admitting: Physician Assistant

## 2023-05-22 VITALS — BP 132/67 | HR 64 | Temp 98.2°F | Resp 13 | Wt 135.3 lb

## 2023-05-22 DIAGNOSIS — Z79620 Long term (current) use of immunosuppressive biologic: Secondary | ICD-10-CM | POA: Diagnosis not present

## 2023-05-22 DIAGNOSIS — C7800 Secondary malignant neoplasm of unspecified lung: Secondary | ICD-10-CM | POA: Diagnosis not present

## 2023-05-22 DIAGNOSIS — C44329 Squamous cell carcinoma of skin of other parts of face: Secondary | ICD-10-CM | POA: Insufficient documentation

## 2023-05-22 DIAGNOSIS — C7989 Secondary malignant neoplasm of other specified sites: Secondary | ICD-10-CM

## 2023-05-22 DIAGNOSIS — Z5112 Encounter for antineoplastic immunotherapy: Secondary | ICD-10-CM

## 2023-05-22 LAB — COMPREHENSIVE METABOLIC PANEL
ALT: 8 U/L (ref 0–44)
AST: 14 U/L — ABNORMAL LOW (ref 15–41)
Albumin: 3.9 g/dL (ref 3.5–5.0)
Alkaline Phosphatase: 96 U/L (ref 38–126)
Anion gap: 7 (ref 5–15)
BUN: 21 mg/dL (ref 8–23)
CO2: 28 mmol/L (ref 22–32)
Calcium: 9.4 mg/dL (ref 8.9–10.3)
Chloride: 107 mmol/L (ref 98–111)
Creatinine, Ser: 0.93 mg/dL (ref 0.61–1.24)
GFR, Estimated: >60 mL/min (ref >60)
Glucose, Bld: 174 mg/dL — ABNORMAL HIGH (ref 70–99)
Potassium: 3.9 mmol/L (ref 3.5–5.1)
Sodium: 142 mmol/L (ref 135–145)
Total Bilirubin: 0.6 mg/dL (ref <1.2)
Total Protein: 6.5 g/dL (ref 6.5–8.1)

## 2023-05-22 LAB — CBC WITH DIFFERENTIAL/PLATELET
Abs Immature Granulocytes: 0.01 10*3/uL (ref 0.00–0.07)
Basophils Absolute: 0 10*3/uL (ref 0.0–0.1)
Basophils Relative: 0 %
Eosinophils Absolute: 0.2 10*3/uL (ref 0.0–0.5)
Eosinophils Relative: 4 %
HCT: 39.6 % (ref 39.0–52.0)
Hemoglobin: 13.4 g/dL (ref 13.0–17.0)
Immature Granulocytes: 0 %
Lymphocytes Relative: 17 %
Lymphs Abs: 1 10*3/uL (ref 0.7–4.0)
MCH: 29.7 pg (ref 26.0–34.0)
MCHC: 33.8 g/dL (ref 30.0–36.0)
MCV: 87.8 fL (ref 80.0–100.0)
Monocytes Absolute: 0.4 10*3/uL (ref 0.1–1.0)
Monocytes Relative: 7 %
Neutro Abs: 4.2 10*3/uL (ref 1.7–7.7)
Neutrophils Relative %: 72 %
Platelets: 200 10*3/uL (ref 150–400)
RBC: 4.51 MIL/uL (ref 4.22–5.81)
RDW: 13.8 % (ref 11.5–15.5)
WBC: 5.8 10*3/uL (ref 4.0–10.5)
nRBC: 0 % (ref 0.0–0.2)

## 2023-05-22 LAB — TSH: TSH: 2.262 u[IU]/mL (ref 0.350–4.500)

## 2023-05-22 MED ORDER — SODIUM CHLORIDE 0.9 % IV SOLN: Freq: Once | INTRAVENOUS | Status: AC

## 2023-05-22 MED ORDER — HEPARIN SOD (PORK) LOCK FLUSH 100 UNIT/ML IV SOLN
500.0000 [IU] | Freq: Once | INTRAVENOUS | Status: DC | PRN
Start: 2023-05-22 — End: 2023-05-22

## 2023-05-22 MED ORDER — SODIUM CHLORIDE 0.9 % IV SOLN
350.0000 mg | Freq: Once | INTRAVENOUS | Status: AC
  Administered 2023-05-22: 350 mg via INTRAVENOUS
  Filled 2023-05-22: qty 7

## 2023-05-22 MED ORDER — SODIUM CHLORIDE 0.9% FLUSH: 10.0000 mL | INTRAVENOUS | Status: DC | PRN

## 2023-05-22 NOTE — Patient Instructions (Signed)
[facility] - A DEPT OF [facility]   Discharge Instructions: Thank you for choosing [facility] to provide your oncology and hematology care.   If you have a lab appointment with the Cancer Center, please go directly to the Cancer Center and check in at the registration area.   Wear comfortable clothing and clothing appropriate for easy access to any Portacath or PICC line.   We strive to give you quality time with your provider. You may need to reschedule your appointment if you arrive late (15 or more minutes).  Arriving late affects you and other patients whose appointments are after yours.  Also, if you miss three or more appointments without notifying the office, you may be dismissed from the clinic at the provider's discretion.      For prescription refill requests, have your pharmacy contact our office and allow 72 hours for refills to be completed.    Today you received the following chemotherapy and/or immunotherapy agents: Cemiplimab (Libtayo)       To help prevent nausea and vomiting after your treatment, we encourage you to take your nausea medication as directed.  BELOW ARE SYMPTOMS THAT SHOULD BE REPORTED IMMEDIATELY: *FEVER GREATER THAN 100.4 F (38 C) OR HIGHER *CHILLS OR SWEATING *NAUSEA AND VOMITING THAT IS NOT CONTROLLED WITH YOUR NAUSEA MEDICATION *UNUSUAL SHORTNESS OF BREATH *UNUSUAL BRUISING OR BLEEDING *URINARY PROBLEMS (pain or burning when urinating, or frequent urination) *BOWEL PROBLEMS (unusual diarrhea, constipation, pain near the anus) TENDERNESS IN MOUTH AND THROAT WITH OR WITHOUT PRESENCE OF ULCERS (sore throat, sores in mouth, or a toothache) UNUSUAL RASH, SWELLING OR PAIN  UNUSUAL VAGINAL DISCHARGE OR ITCHING   Items with * indicate a potential emergency and should be followed up as soon as possible or go to the Emergency Department if any problems should occur.  Please show the CHEMOTHERAPY ALERT CARD or  IMMUNOTHERAPY ALERT CARD at check-in to the Emergency Department and triage nurse.  Should you have questions after your visit or need to cancel or reschedule your appointment, please contact [facility] - A DEPT OF [facility]  Dept: [phone]  and follow the prompts.  Office hours are 8:00 a.m. to 4:30 p.m. Monday - Friday. Please note that voicemails left after 4:00 p.m. may not be returned until the following business day.  We are closed weekends and major holidays. You have access to a nurse at all times for urgent questions. Please call the main number to the clinic Dept: [phone] and follow the prompts.   For any non-urgent questions, you may also contact your provider using MyChart. We now offer e-Visits for anyone 70 and older to request care online for non-urgent symptoms. For details visit [email or web address].   Also download the MyChart app! Go to the app store, search "MyChart", open the app, select [place], and log in with your MyChart username and password.

## 2023-05-23 LAB — T4: T4, Total: 6.4 ug/dL (ref 4.5–12.0)

## 2023-06-03 ENCOUNTER — Other Ambulatory Visit: Payer: Self-pay

## 2023-06-04 DIAGNOSIS — D2261 Melanocytic nevi of right upper limb, including shoulder: Secondary | ICD-10-CM | POA: Diagnosis not present

## 2023-06-04 DIAGNOSIS — D225 Melanocytic nevi of trunk: Secondary | ICD-10-CM | POA: Diagnosis not present

## 2023-06-04 DIAGNOSIS — L905 Scar conditions and fibrosis of skin: Secondary | ICD-10-CM | POA: Diagnosis not present

## 2023-06-04 DIAGNOSIS — Z85828 Personal history of other malignant neoplasm of skin: Secondary | ICD-10-CM | POA: Diagnosis not present

## 2023-06-04 DIAGNOSIS — D2262 Melanocytic nevi of left upper limb, including shoulder: Secondary | ICD-10-CM | POA: Diagnosis not present

## 2023-06-04 DIAGNOSIS — L570 Actinic keratosis: Secondary | ICD-10-CM | POA: Diagnosis not present

## 2023-06-04 DIAGNOSIS — L821 Other seborrheic keratosis: Secondary | ICD-10-CM | POA: Diagnosis not present

## 2023-06-08 ENCOUNTER — Other Ambulatory Visit: Payer: Self-pay

## 2023-06-12 ENCOUNTER — Inpatient Hospital Stay: Payer: Medicare Other

## 2023-06-12 ENCOUNTER — Inpatient Hospital Stay: Payer: Medicare Other | Admitting: Internal Medicine

## 2023-06-12 VITALS — BP 137/66 | HR 80 | Temp 98.0°F | Resp 17 | Ht 70.0 in | Wt 135.9 lb

## 2023-06-12 DIAGNOSIS — C7989 Secondary malignant neoplasm of other specified sites: Secondary | ICD-10-CM

## 2023-06-12 DIAGNOSIS — C7800 Secondary malignant neoplasm of unspecified lung: Secondary | ICD-10-CM | POA: Diagnosis not present

## 2023-06-12 DIAGNOSIS — C44329 Squamous cell carcinoma of skin of other parts of face: Secondary | ICD-10-CM | POA: Diagnosis not present

## 2023-06-12 DIAGNOSIS — Z79620 Long term (current) use of immunosuppressive biologic: Secondary | ICD-10-CM | POA: Diagnosis not present

## 2023-06-12 DIAGNOSIS — Z5112 Encounter for antineoplastic immunotherapy: Secondary | ICD-10-CM | POA: Diagnosis not present

## 2023-06-12 LAB — TSH: TSH: 1.442 u[IU]/mL (ref 0.350–4.500)

## 2023-06-12 LAB — CMP (CANCER CENTER ONLY)
ALT: 16 U/L (ref 0–44)
AST: 18 U/L (ref 15–41)
Albumin: 4 g/dL (ref 3.5–5.0)
Alkaline Phosphatase: 100 U/L (ref 38–126)
Anion gap: 6 (ref 5–15)
BUN: 15 mg/dL (ref 8–23)
CO2: 29 mmol/L (ref 22–32)
Calcium: 9.6 mg/dL (ref 8.9–10.3)
Chloride: 105 mmol/L (ref 98–111)
Creatinine: 0.9 mg/dL (ref 0.61–1.24)
GFR, Estimated: >60 mL/min (ref >60)
Glucose, Bld: 179 mg/dL — ABNORMAL HIGH (ref 70–99)
Potassium: 3.7 mmol/L (ref 3.5–5.1)
Sodium: 140 mmol/L (ref 135–145)
Total Bilirubin: 0.7 mg/dL (ref <1.2)
Total Protein: 6.7 g/dL (ref 6.5–8.1)

## 2023-06-12 LAB — CBC WITH DIFFERENTIAL (CANCER CENTER ONLY)
Abs Immature Granulocytes: 0.01 10*3/uL (ref 0.00–0.07)
Basophils Absolute: 0 10*3/uL (ref 0.0–0.1)
Basophils Relative: 0 %
Eosinophils Absolute: 0.2 10*3/uL (ref 0.0–0.5)
Eosinophils Relative: 3 %
HCT: 39.4 % (ref 39.0–52.0)
Hemoglobin: 13.3 g/dL (ref 13.0–17.0)
Immature Granulocytes: 0 %
Lymphocytes Relative: 21 %
Lymphs Abs: 1.3 10*3/uL (ref 0.7–4.0)
MCH: 29.4 pg (ref 26.0–34.0)
MCHC: 33.8 g/dL (ref 30.0–36.0)
MCV: 87 fL (ref 80.0–100.0)
Monocytes Absolute: 0.4 10*3/uL (ref 0.1–1.0)
Monocytes Relative: 7 %
Neutro Abs: 4.5 10*3/uL (ref 1.7–7.7)
Neutrophils Relative %: 69 %
Platelet Count: 191 10*3/uL (ref 150–400)
RBC: 4.53 MIL/uL (ref 4.22–5.81)
RDW: 13.9 % (ref 11.5–15.5)
WBC Count: 6.5 10*3/uL (ref 4.0–10.5)
nRBC: 0 % (ref 0.0–0.2)

## 2023-06-12 MED ORDER — SODIUM CHLORIDE 0.9 % IV SOLN
350.0000 mg | Freq: Once | INTRAVENOUS | Status: AC
  Administered 2023-06-12: 350 mg via INTRAVENOUS
  Filled 2023-06-12: qty 7

## 2023-06-12 MED ORDER — SODIUM CHLORIDE 0.9 % IV SOLN: Freq: Once | INTRAVENOUS | Status: AC

## 2023-06-12 NOTE — Progress Notes (Signed)
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 He initially presented with skin cancer around the parotid gland in April 2022.   PRIOR THERAPY: 1) status post left parotidectomy with skin resection and rotational flap completed by Dr. Pollyann Kennedy in April 2022. Final pathology showed squamous cell carcinoma.  2) status post radiation therapy under the care of Dr. Basilio Cairo completing 33 fractions on December 19, 2020 for a total of 66 Gray   CURRENT THERAPY: Libtayo (Cempilimab) 350 Mg IV every 3 weeks started August 10, 2021.  Status post 31 cycles of treatment.  INTERVAL HISTORY: Shaun Mcmillan 82 y.o. male returns to the clinic today for follow-up visit accompanied by his wife Shaun Mcmillan.Discussed the use of AI scribe software for clinical note transcription with the patient, who gave verbal consent to proceed.  History of Present Illness   The patient, an 82 year old diagnosed with stage four squamous cell carcinoma of the skin with metastasis to the lung in 2022, has been undergoing immunotherapy with Libtayo every three weeks. To date, he has completed thirty-one cycles of this treatment. He reports feeling good with no side effects from the treatment. He denies any itching, rash, diarrhea, nausea, or vomiting. He does report occasional back itching, but it is mild and does not disrupt his daily activities. His lab work is generally good, with a slight elevation in blood sugar at 175.      MEDICAL HISTORY: Past Medical History:  Diagnosis Date   Arthritis    Diabetes mellitus type II    Glaucoma    sees Dr. Charlotte Sanes    History of kidney stones    passed   Hx of colonic polyps    Hyperlipidemia    Hypertension    Migraines    Shingles 04/2010   left leg   Subdural hematoma  (HCC) 11/13/08   with small areas of surronding infarct    ALLERGIES:  has no known allergies.  MEDICATIONS:  Current Outpatient Medications  Medication Sig Dispense Refill   acetaminophen (TYLENOL) 500 MG tablet Take 1,000 mg by mouth every 8 (eight) hours as needed for moderate pain. (Patient not taking: Reported on 03/20/2023)     amLODipine (NORVASC) 10 MG tablet TAKE 1 TABLET BY MOUTH DAILY 100 tablet 2   aspirin EC 81 MG tablet Take 1 tablet (81 mg total) by mouth daily. Swallow whole. 30 tablet 11   atorvastatin (LIPITOR) 40 MG tablet TAKE 1 TABLET BY MOUTH DAILY 100 tablet 2   Cholecalciferol (VITAMIN D PO) Take 5,000 Units by mouth daily.     gabapentin (NEURONTIN) 300 MG capsule TAKE 2 CAPSULES BY MOUTH 3 TIMES A DAY (Patient not taking: Reported on 03/20/2023) 180 capsule 1   latanoprost (XALATAN) 0.005 % ophthalmic solution Place 1 drop into both eyes every morning. [facility]  [address]     LORazepam (ATIVAN) 1 MG tablet Take 1 tablet (1 mg total) by mouth 3 (three) times daily as needed for anxiety. (Patient not taking: Reported on 03/20/2023) 60 tablet 5   losartan (COZAAR) 25 MG tablet TAKE 1 TABLET BY MOUTH DAILY 100 tablet 2   Melatonin 5 MG TABS Take 5 mg by mouth at bedtime. [facility] [address]     metFORMIN (GLUCOPHAGE) 500 MG tablet TAKE 1 TABLET BY  MOUTH TWICE  DAILY WITH A MEAL 200 tablet 2   Multiple Vitamin (MULTIVITAMIN) tablet Take 1 tablet by mouth daily.     prochlorperazine (COMPAZINE) 10 MG tablet Take 1 tablet (10 mg total) by mouth every 6 (six) hours as needed for nausea or vomiting. (Patient not taking: Reported on 03/20/2023) 30 tablet 0   timolol (BETIMOL) 0.5 % ophthalmic solution Place 1 drop into both eyes daily. [facility] [address]     No current facility-administered medications for this visit.    SURGICAL HISTORY:  Past Surgical History:  Procedure Laterality Date   APPENDECTOMY     colonscopy  03/12/2016   per Dr. Ewing Schlein, benign polyps, repeat in 5 yrs   left  craniotomy  11/03/08   per Dr. Marikay Alar for a subdrual hematoma   PAROTIDECTOMY Left 09/20/2020   Procedure: PAROTIDECTOMY with resection of skin and rotational flap;  Surgeon: Serena Colonel, MD;  Location: [facility] OR;  Service: ENT;  Laterality: Left;   surgery for ocmpound fracture     right leg    REVIEW OF SYSTEMS:  A comprehensive review of systems was negative.   PHYSICAL EXAMINATION: General appearance: alert, cooperative, and no distress Head: Normocephalic, without obvious abnormality, atraumatic Neck: no adenopathy, no JVD, supple, symmetrical, trachea midline, and thyroid not enlarged, symmetric, no tenderness/mass/nodules Lymph nodes: Cervical, supraclavicular, and axillary nodes normal. Resp: clear to auscultation bilaterally Back: symmetric, no curvature. ROM normal. No CVA tenderness. Cardio: regular rate and rhythm, S1, S2 normal, no murmur, click, rub or gallop GI: soft, non-tender; bowel sounds normal; no masses,  no organomegaly Extremities: extremities normal, atraumatic, no cyanosis or edema  ECOG PERFORMANCE STATUS: 1 - Symptomatic but completely ambulatory  Blood pressure 137/66, pulse 80, temperature 98 F (36.7 C), temperature source Temporal, resp. rate 17, height 5\' 10"  (1.778 m), weight 135 lb 14.4 oz (61.6 kg), SpO2 100%.  LABORATORY DATA: Lab Results  Component Value Date   WBC 6.5 06/12/2023   HGB 13.3 06/12/2023   HCT 39.4 06/12/2023   MCV 87.0 06/12/2023   PLT 191 06/12/2023      Chemistry      Component Value Date/Time   NA 140 06/12/2023 0952   K 3.7 06/12/2023 0952   CL 105 06/12/2023 0952   CO2 29 06/12/2023 0952   BUN 15 06/12/2023 0952   CREATININE 0.90 06/12/2023 0952   CREATININE 0.69 (L) 01/08/2021 1553      Component Value Date/Time   CALCIUM 9.6 06/12/2023 0952   ALKPHOS 100 06/12/2023 0952   AST 18 06/12/2023 0952   ALT 16 06/12/2023 0952   BILITOT 0.7 06/12/2023 0952       RADIOGRAPHIC STUDIES: No results  found.  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  He had evidence for disease progression and metastasis to the lung in February 2023. The patient is currently undergoing treatment with immunotherapy with Libtayo (Cempilimab) 350 Mg IV every 3 weeks status post 31 cycles.  The patient has been tolerating this treatment fairly well with no concerning adverse effects.    Stage IV Squamous Cell Carcinoma of the Skin with Metastasis to the Lung Diagnosed in 2022. Underwent left parotidectomy and radiation therapy. Currently on immunotherapy with Libtayo, receiving the  32nd cycle today. No reported side effects such as pruritus, rash, diarrhea, nausea, or vomiting. Blood glucose slightly elevated at 175. Plan to complete 35 cycles of immunotherapy, followed by a scan to assess the response. Discussed that the goal is to prolong life and improve quality of life, with a cure being rare. Emphasized close monitoring post-treatment to check for recurrence. Explained that even with treatment, recurrence is possible, and the recommendation is two years of immunotherapy followed by regular scans. - Administer 32nd cycle of Libtayo - Complete 35 cycles of immunotherapy - Order scan after 35th cycle to assess response - Monitor closely for recurrence post-treatment - Schedule follow-up in three weeks.   The patient was advised to call immediately if he has any concerning symptoms in the interval. The patient voices understanding of current disease status and treatment options and is in agreement with the current care plan.  All questions were answered. The patient knows to call the clinic with any problems, questions or concerns. We can  certainly see the patient much sooner if necessary.  The total time spent in the appointment was 20 minutes.  Disclaimer: This note was dictated with voice recognition software. Similar sounding words can inadvertently be transcribed and may not be corrected upon review.

## 2023-06-12 NOTE — Patient Instructions (Signed)
 [facility] - A DEPT OF [facility]   Discharge Instructions: Thank you for choosing [facility] to provide your oncology and hematology care.   If you have a lab appointment with the Cancer Center, please go directly to the Cancer Center and check in at the registration area.   Wear comfortable clothing and clothing appropriate for easy access to any Portacath or PICC line.   We strive to give you quality time with your provider. You may need to reschedule your appointment if you arrive late (15 or more minutes).  Arriving late affects you and other patients whose appointments are after yours.  Also, if you miss three or more appointments without notifying the office, you may be dismissed from the clinic at the provider's discretion.      For prescription refill requests, have your pharmacy contact our office and allow 72 hours for refills to be completed.    Today you received the following chemotherapy and/or immunotherapy agents: Cemiplimab (Libtayo)       To help prevent nausea and vomiting after your treatment, we encourage you to take your nausea medication as directed.  BELOW ARE SYMPTOMS THAT SHOULD BE REPORTED IMMEDIATELY: *FEVER GREATER THAN 100.4 F (38 C) OR HIGHER *CHILLS OR SWEATING *NAUSEA AND VOMITING THAT IS NOT CONTROLLED WITH YOUR NAUSEA MEDICATION *UNUSUAL SHORTNESS OF BREATH *UNUSUAL BRUISING OR BLEEDING *URINARY PROBLEMS (pain or burning when urinating, or frequent urination) *BOWEL PROBLEMS (unusual diarrhea, constipation, pain near the anus) TENDERNESS IN MOUTH AND THROAT WITH OR WITHOUT PRESENCE OF ULCERS (sore throat, sores in mouth, or a toothache) UNUSUAL RASH, SWELLING OR PAIN  UNUSUAL VAGINAL DISCHARGE OR ITCHING   Items with * indicate a potential emergency and should be followed up as soon as possible or go to the Emergency Department if any problems should occur.  Please show the CHEMOTHERAPY ALERT CARD or  IMMUNOTHERAPY ALERT CARD at check-in to the Emergency Department and triage nurse.  Should you have questions after your visit or need to cancel or reschedule your appointment, please contact [facility]  Dept: [phone]  and follow the prompts.  Office hours are 8:00 a.m. to 4:30 p.m. Monday - Friday. Please note that voicemails left after 4:00 p.m. may not be returned until the following business day.  We are closed weekends and major holidays. You have access to a nurse at all times for urgent questions. Please call the main number to the clinic Dept: [phone] and follow the prompts.   For any non-urgent questions, you may also contact your provider using MyChart. We now offer e-Visits for anyone 70 and older to request care online for non-urgent symptoms. For details visit [email or web address].   Also download the MyChart app! Go to the app store, search "MyChart", open the app, select [place], and log in with your MyChart username and password.

## 2023-06-13 LAB — T4: T4, Total: 6.6 ug/dL (ref 4.5–12.0)

## 2023-06-15 ENCOUNTER — Other Ambulatory Visit: Payer: Self-pay

## 2023-06-27 ENCOUNTER — Other Ambulatory Visit: Payer: Self-pay

## 2023-06-29 NOTE — Progress Notes (Signed)
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 He initially presented with skin cancer around the parotid gland in April 2022   PRIOR THERAPY: 1) status post left parotidectomy with skin resection and rotational flap completed by Dr. Pollyann Kennedy in April 2022. Final pathology showed squamous cell carcinoma.  2) status post radiation therapy under the care of Dr. Basilio Cairo completing 33 fractions on December 19, 2020 for a total of 66 Gray  CURRENT THERAPY: Libtayo (Cempilimab) 350 Mg IV every 3 weeks started August 10, 2021.  Status post 32 cycles of treatment.   INTERVAL HISTORY: Shaun Mcmillan 83 y.o. male returns to the clinic today for a follow-up visit. The patient was last seen 3 weeks ago by Dr. Arbutus Ped. The patient is currently undergoing immunotherapy with Libtayo every 3 weeks. He tolerates it well without any concerning adverse side effects. Today he denies any fever, chills, or night sweats.   He sees ENT and had a swallow study performed by a speech therapist for his aspiration/dysphagia. They told him to tuck his chin to the right when he swallows which has helped. He does sometimes cough after he eats but otherwise denies cough. Denies any chest pain, shortness of breath, or hemoptysis. Denies any nausea, vomiting, diarrhea, or constipation.  Denies any headache or visual changes.  Denies any rashes or skin changes. He sees Dr. Yetta Barre from dermatology and had an appointment on 06/04/23 in which they took a few places off his scalp but states it was nothing serious. He is here today for evaluation and repeat blood work before undergoing cycle #33.    MEDICAL HISTORY: Past Medical History:  Diagnosis Date   Arthritis    Diabetes mellitus type II    Glaucoma    sees Dr. 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  MEDICATIONS:  Current Outpatient Medications  Medication Sig Dispense Refill   acetaminophen (TYLENOL) 500 MG tablet Take 1,000 mg by mouth every 8 (eight) hours as needed for moderate pain. (Patient not taking: Reported on 03/20/2023)     amLODipine (NORVASC) 10 MG tablet TAKE 1 TABLET BY MOUTH DAILY 100 tablet 2   aspirin EC 81 MG tablet Take 1 tablet (81 mg total) by mouth daily. Swallow whole. 30 tablet 11   atorvastatin (LIPITOR) 40 MG tablet TAKE 1 TABLET BY MOUTH DAILY 100 tablet 2   Cholecalciferol (VITAMIN D PO) Take 5,000 Units by mouth daily.     gabapentin (NEURONTIN) 300 MG capsule TAKE 2 CAPSULES BY MOUTH 3 TIMES A DAY (Patient not taking: Reported on 03/20/2023) 180 capsule 1   latanoprost (XALATAN) 0.005 % ophthalmic solution Place 1 drop into both eyes every morning. [facility]  [address]     LORazepam (ATIVAN) 1 MG tablet Take 1 tablet (1 mg total) by mouth 3 (three) times daily as needed for anxiety. (Patient not taking: Reported on 03/20/2023) 60 tablet 5   losartan (COZAAR) 25 MG tablet TAKE 1 TABLET BY MOUTH DAILY 100 tablet 2   Melatonin 5 MG TABS  Take 5 mg by mouth at bedtime. [facility] [address]     metFORMIN (GLUCOPHAGE) 500 MG tablet TAKE 1 TABLET BY MOUTH TWICE  DAILY WITH A MEAL 200 tablet 2   Multiple Vitamin (MULTIVITAMIN) tablet Take 1 tablet by mouth daily.     prochlorperazine (COMPAZINE) 10 MG tablet Take 1 tablet (10 mg total) by mouth every 6 (six) hours as needed for nausea or vomiting. (Patient not taking: Reported on 03/20/2023) 30 tablet 0   timolol (BETIMOL) 0.5 % ophthalmic solution Place 1 drop into both eyes daily. [facility] [address]     No current facility-administered medications for this visit.    SURGICAL HISTORY:  Past Surgical History:  Procedure  Laterality Date   APPENDECTOMY     colonscopy  03/12/2016   per Dr. Ewing Schlein, benign polyps, repeat in 5 yrs   left craniotomy  11/03/08   per Dr. Marikay Alar for a subdrual hematoma   PAROTIDECTOMY Left 09/20/2020   Procedure: PAROTIDECTOMY with resection of skin and rotational flap;  Surgeon: Serena Colonel, MD;  Location: [facility] OR;  Service: ENT;  Laterality: Left;   surgery for ocmpound fracture     right leg    REVIEW OF SYSTEMS:   Constitutional: Positive for continued diminished appetite. Negative for chills, fatigue, fever and unexpected weight change.  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 Positive for stable dysphagia. Negative for mouth sores, nosebleeds, sore throat.  Eyes: Negative for eye problems and icterus.  Respiratory: Negative for hemoptysis, cough, shortness of breath and wheezing.   Cardiovascular: Negative for chest pain and leg swelling.  Gastrointestinal: Negative for abdominal pain, constipation, diarrhea, nausea and vomiting.  Genitourinary: Negative for bladder incontinence, difficulty urinating, dysuria, frequency and hematuria.   Musculoskeletal: Negative for back pain, gait problem, neck pain and neck stiffness.  Skin: Negative for itching and rash.  Neurological: Negative for dizziness, extremity weakness, gait problem, headaches, light-headedness and seizures.  Hematological: Negative for adenopathy. Does not bruise/bleed easily.  Psychiatric/Behavioral: Negative for confusion, depression and sleep disturbance. The patient is not nervous/anxious.      PHYSICAL EXAMINATION:  Blood pressure (!) 145/48, pulse 76, temperature 97.9 F (36.6 C), temperature source Temporal, resp. rate 15, weight 135 lb 9.6 oz (61.5 kg), SpO2 97%.  ECOG PERFORMANCE STATUS: 1  Physical Exam  Constitutional: Oriented to person, place, and time and cachetic appearing male and in no distress.  Head: Normocephalic and atraumatic.  Mouth/Throat: Oropharynx is clear  and moist. No oropharyngeal exudate.  Eyes: Conjunctivae are normal. Right eye exhibits no discharge. Left eye exhibits no discharge. No scleral icterus.  Neck: Normal range of motion. Neck supple.  Cardiovascular: Normal rate, regular rhythm, normal heart sounds and intact distal pulses.   Pulmonary/Chest: Effort normal and breath sounds normal. No respiratory distress. No wheezes. No rales.  Abdominal: Soft. Bowel sounds are normal. Exhibits no distension and no mass. There is no tenderness.  Musculoskeletal: Normal range of motion. Exhibits no edema.  Lymphadenopathy:    No cervical adenopathy.  Neurological: Alert and oriented to person, place, and time. Exhibits muscle wasting. Gait normal. Coordination normal.  Skin: Skin is warm and dry. No rash noted. Not diaphoretic. No erythema. No pallor.  Psychiatric: Mood, memory and judgment normal.  Vitals reviewed.  LABORATORY DATA: Lab Results  Component Value Date   WBC 6.8 07/03/2023   HGB 12.9 (L) 07/03/2023   HCT 38.5 (L) 07/03/2023   MCV 85.6 07/03/2023   PLT 205 07/03/2023  Chemistry      Component Value Date/Time   NA 140 07/03/2023 0932   K 3.7 07/03/2023 0932   CL 105 07/03/2023 0932   CO2 29 07/03/2023 0932   BUN 15 07/03/2023 0932   CREATININE 1.02 07/03/2023 0932   CREATININE 0.69 (L) 01/08/2021 1553      Component Value Date/Time   CALCIUM 9.8 07/03/2023 0932   ALKPHOS 113 07/03/2023 0932   AST 17 07/03/2023 0932   ALT 14 07/03/2023 0932   BILITOT 0.6 07/03/2023 0932       RADIOGRAPHIC STUDIES:  No results found.   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  He was initially diagnosed in April 2022 with involvement of the skin around the left parotid gland status post left parotidectomy with skin resection followed by adjuvant radiation.  He then had evidence of disease progression with metastases  to the lung in February 2023.   The patient is currently undergoing systemic treatment with immunotherapy with Libtayo 350 mg IV every 3 weeks.  He is status post 32 cycles.  The plan is to complete 2 years of therapy as long as he is tolerating well and there is no evidence of disease progression or unacceptable toxicity.  Labs are acceptable. He will proceed cycle #33 today We will see him in 3 weeks prior to starting cycle #34.    He will continue to follow with dermatology.    He will also continue to follow with ENT and speech therapy regarding his aspiration. I have refilled his nystatin. Also encouraged drinking plenty of fluids, salt water rinses, and biotene  The patient was advised to call immediately if he has any concerning symptoms in the interval. The patient voices understanding of current disease status and treatment options and is in agreement with the current care plan. All questions were answered. The patient knows to call the clinic with any problems, questions or concerns. We can certainly see the patient much sooner if necessary       No orders of the defined types were placed in this encounter.    The total time spent in the appointment was 20-29 minutes  Aryahna Spagna L Jazell Rosenau, PA-C 07/03/23

## 2023-07-03 ENCOUNTER — Inpatient Hospital Stay: Payer: Medicare Other | Attending: Oncology

## 2023-07-03 ENCOUNTER — Inpatient Hospital Stay: Payer: Medicare Other

## 2023-07-03 ENCOUNTER — Inpatient Hospital Stay (HOSPITAL_BASED_OUTPATIENT_CLINIC_OR_DEPARTMENT_OTHER): Payer: Medicare Other | Admitting: Physician Assistant

## 2023-07-03 VITALS — BP 145/48 | HR 76 | Temp 97.9°F | Resp 15 | Wt 135.6 lb

## 2023-07-03 DIAGNOSIS — C7989 Secondary malignant neoplasm of other specified sites: Secondary | ICD-10-CM

## 2023-07-03 DIAGNOSIS — Z79620 Long term (current) use of immunosuppressive biologic: Secondary | ICD-10-CM | POA: Diagnosis not present

## 2023-07-03 DIAGNOSIS — C7800 Secondary malignant neoplasm of unspecified lung: Secondary | ICD-10-CM | POA: Diagnosis not present

## 2023-07-03 DIAGNOSIS — C44329 Squamous cell carcinoma of skin of other parts of face: Secondary | ICD-10-CM | POA: Diagnosis not present

## 2023-07-03 DIAGNOSIS — Z5112 Encounter for antineoplastic immunotherapy: Secondary | ICD-10-CM | POA: Diagnosis not present

## 2023-07-03 LAB — CMP (CANCER CENTER ONLY)
ALT: 14 U/L (ref 0–44)
AST: 17 U/L (ref 15–41)
Albumin: 3.9 g/dL (ref 3.5–5.0)
Alkaline Phosphatase: 113 U/L (ref 38–126)
Anion gap: 6 (ref 5–15)
BUN: 15 mg/dL (ref 8–23)
CO2: 29 mmol/L (ref 22–32)
Calcium: 9.8 mg/dL (ref 8.9–10.3)
Chloride: 105 mmol/L (ref 98–111)
Creatinine: 1.02 mg/dL (ref 0.61–1.24)
GFR, Estimated: >60 mL/min (ref >60)
Glucose, Bld: 177 mg/dL — ABNORMAL HIGH (ref 70–99)
Potassium: 3.7 mmol/L (ref 3.5–5.1)
Sodium: 140 mmol/L (ref 135–145)
Total Bilirubin: 0.6 mg/dL (ref 0.0–1.2)
Total Protein: 6.6 g/dL (ref 6.5–8.1)

## 2023-07-03 LAB — CBC WITH DIFFERENTIAL (CANCER CENTER ONLY)
Abs Immature Granulocytes: 0.03 10*3/uL (ref 0.00–0.07)
Basophils Absolute: 0 10*3/uL (ref 0.0–0.1)
Basophils Relative: 0 %
Eosinophils Absolute: 0.2 10*3/uL (ref 0.0–0.5)
Eosinophils Relative: 2 %
HCT: 38.5 % — ABNORMAL LOW (ref 39.0–52.0)
Hemoglobin: 12.9 g/dL — ABNORMAL LOW (ref 13.0–17.0)
Immature Granulocytes: 0 %
Lymphocytes Relative: 14 %
Lymphs Abs: 1 10*3/uL (ref 0.7–4.0)
MCH: 28.7 pg (ref 26.0–34.0)
MCHC: 33.5 g/dL (ref 30.0–36.0)
MCV: 85.6 fL (ref 80.0–100.0)
Monocytes Absolute: 0.5 10*3/uL (ref 0.1–1.0)
Monocytes Relative: 7 %
Neutro Abs: 5.2 10*3/uL (ref 1.7–7.7)
Neutrophils Relative %: 77 %
Platelet Count: 205 10*3/uL (ref 150–400)
RBC: 4.5 MIL/uL (ref 4.22–5.81)
RDW: 13.4 % (ref 11.5–15.5)
WBC Count: 6.8 10*3/uL (ref 4.0–10.5)
nRBC: 0 % (ref 0.0–0.2)

## 2023-07-03 LAB — TSH: TSH: 1.896 u[IU]/mL (ref 0.350–4.500)

## 2023-07-03 MED ORDER — SODIUM CHLORIDE 0.9 % IV SOLN: Freq: Once | INTRAVENOUS | Status: AC

## 2023-07-03 MED ORDER — SODIUM CHLORIDE 0.9 % IV SOLN
350.0000 mg | Freq: Once | INTRAVENOUS | Status: AC
  Administered 2023-07-03: 350 mg via INTRAVENOUS
  Filled 2023-07-03: qty 7

## 2023-07-03 NOTE — Patient Instructions (Signed)
 [facility]  Discharge Instructions: Thank you for choosing [facility] to provide your oncology and hematology care.   If you have a lab appointment with the Cancer Center, please go directly to the Cancer Center and check in at the registration area.   Wear comfortable clothing and clothing appropriate for easy access to any Portacath or PICC line.   We strive to give you quality time with your provider. You may need to reschedule your appointment if you arrive late (15 or more minutes).  Arriving late affects you and other patients whose appointments are after yours.  Also, if you miss three or more appointments without notifying the office, you may be dismissed from the clinic at the provider's discretion.      For prescription refill requests, have your pharmacy contact our office and allow 72 hours for refills to be completed.    Today you received the following chemotherapy and/or immunotherapy agents: Libtayo.       To help prevent nausea and vomiting after your treatment, we encourage you to take your nausea medication as directed.  BELOW ARE SYMPTOMS THAT SHOULD BE REPORTED IMMEDIATELY: *FEVER GREATER THAN 100.4 F (38 C) OR HIGHER *CHILLS OR SWEATING *NAUSEA AND VOMITING THAT IS NOT CONTROLLED WITH YOUR NAUSEA MEDICATION *UNUSUAL SHORTNESS OF BREATH *UNUSUAL BRUISING OR BLEEDING *URINARY PROBLEMS (pain or burning when urinating, or frequent urination) *BOWEL PROBLEMS (unusual diarrhea, constipation, pain near the anus) TENDERNESS IN MOUTH AND THROAT WITH OR WITHOUT PRESENCE OF ULCERS (sore throat, sores in mouth, or a toothache) UNUSUAL RASH, SWELLING OR PAIN  UNUSUAL VAGINAL DISCHARGE OR ITCHING   Items with * indicate a potential emergency and should be followed up as soon as possible or go to the Emergency Department if any problems should occur.  Please show the CHEMOTHERAPY ALERT CARD or IMMUNOTHERAPY  ALERT CARD at check-in to the Emergency Department and triage nurse.  Should you have questions after your visit or need to cancel or reschedule your appointment, please contact [facility]  Dept: [phone]  and follow the prompts.  Office hours are 8:00 a.m. to 4:30 p.m. Monday - Friday. Please note that voicemails left after 4:00 p.m. may not be returned until the following business day.  We are closed weekends and major holidays. You have access to a nurse at all times for urgent questions. Please call the main number to the clinic Dept: [phone] and follow the prompts.   For any non-urgent questions, you may also contact your provider using MyChart. We now offer e-Visits for anyone 79 and older to request care online for non-urgent symptoms. For details visit [email or web address].   Also download the MyChart app! Go to the app store, search "MyChart", open the app, select , and log in with your MyChart username and password.

## 2023-07-04 LAB — T4: T4, Total: 7 ug/dL (ref 4.5–12.0)

## 2023-07-18 NOTE — Progress Notes (Signed)
 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 He initially presented with skin cancer around the parotid gland in April 2022   PRIOR THERAPY: 1) status post left parotidectomy with skin resection and rotational flap completed by Dr. Jesus in April 2022. Final pathology showed squamous cell carcinoma.  2) status post radiation therapy under the care of Dr. Izell completing 33 fractions on December 19, 2020 for a total of 66 Gray  CURRENT THERAPY:  Libtayo  (Cempilimab) 350 Mg IV every 3 weeks started August 10, 2021.  Status post 33 cycles of treatment.   INTERVAL HISTORY: Shaun Mcmillan 83 y.o. male returns to the clinic today for a follow-up visit. The patient was last seen 3 weeks ago by myself. The patient is currently undergoing immunotherapy with Libtayo  every 3 weeks. He tolerates it well without any concerning adverse side effects.  He is wondering next steps after he completes his 35th treatment in 3 weeks.  They also had some questions about the imaging studies that will be performed at that time.  The patient is concerned about recurrence.  Today, he denies changes in his health. He denies any fever, chills, or night sweats. He sees ENT and had a swallow study performed by a speech therapist for his aspiration/dysphagia. They told him to tuck his chin to the right when he swallows which has helped. He does sometimes cough after he eats but otherwise denies cough. Denies any chest pain, shortness of breath, or hemoptysis. Denies any nausea, vomiting, diarrhea, or constipation.  Denies any headache or visual changes.  Denies any rashes or skin changes. He sees Dr. Joshua from dermatology, the most recent visit last week. He is here today for evaluation and repeat blood work before undergoing cycle #34      MEDICAL  HISTORY: Past Medical History:  Diagnosis Date   Arthritis    Diabetes mellitus type II    Glaucoma    sees Dr. 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  MEDICATIONS:  Current Outpatient Medications  Medication Sig Dispense Refill   acetaminophen  (TYLENOL ) 500 MG tablet Take 1,000 mg by mouth every 8 (eight) hours as needed for moderate pain (pain score 4-6).     amLODipine  (NORVASC ) 10 MG tablet TAKE 1 TABLET BY MOUTH DAILY 100 tablet 2   aspirin  EC 81 MG tablet Take 1 tablet (81 mg total) by mouth daily. Swallow whole. 30 tablet 11   atorvastatin  (LIPITOR) 40 MG tablet TAKE 1 TABLET BY MOUTH DAILY 100 tablet 2   Cholecalciferol  (VITAMIN D  PO) Take 5,000 Units by mouth daily.     gabapentin  (NEURONTIN ) 300 MG capsule TAKE 2 CAPSULES BY MOUTH 3 TIMES A DAY 180 capsule 1   latanoprost  (XALATAN ) 0.005 % ophthalmic solution Place 1 drop into both eyes every morning. [facility]  [address]     LORazepam  (ATIVAN ) 1 MG tablet Take 1 tablet (1 mg total) by mouth 3 (three) times daily as needed for anxiety. 60 tablet 5   losartan  (COZAAR ) 25 MG tablet  TAKE 1 TABLET BY MOUTH DAILY 100 tablet 2   Melatonin 5 MG TABS Take 5 mg by mouth at bedtime. [facility] [address]     metFORMIN  (GLUCOPHAGE ) 500 MG tablet TAKE 1 TABLET BY MOUTH TWICE  DAILY WITH A MEAL 200 tablet 2   Multiple Vitamin (MULTIVITAMIN) tablet Take 1 tablet by mouth daily.     timolol  (BETIMOL ) 0.5 % ophthalmic solution Place 1 drop into both eyes daily. [facility] [address]     prochlorperazine  (COMPAZINE ) 10 MG tablet Take 1 tablet (10 mg total) by mouth every 6 (six) hours as needed for nausea or vomiting. (Patient not taking: Reported on 07/24/2023) 30 tablet 0   No current facility-administered medications for this visit.     SURGICAL HISTORY:  Past Surgical History:  Procedure Laterality Date   APPENDECTOMY     colonscopy  03/12/2016   per Dr. Rosalie, benign polyps, repeat in 5 yrs   left craniotomy  11/03/08   per Dr. Alm Molt for a subdrual hematoma   PAROTIDECTOMY Left 09/20/2020   Procedure: PAROTIDECTOMY with resection of skin and rotational flap;  Surgeon: Jesus Oliphant, MD;  Location: [facility] OR;  Service: ENT;  Laterality: Left;   surgery for ocmpound fracture     right leg    REVIEW OF SYSTEMS:   Constitutional: Positive for stable fatigue. Negative for chills, fatigue, fever and unexpected weight change.  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 Positive for stable dysphagia. Negative for mouth sores, nosebleeds, sore throat.  Eyes: Negative for eye problems and icterus.  Respiratory: Negative for hemoptysis, cough, shortness of breath and wheezing.   Cardiovascular: Negative for chest pain and leg swelling.  Gastrointestinal: Negative for abdominal pain, constipation, diarrhea, nausea and vomiting.  Genitourinary: Negative for bladder incontinence, difficulty urinating, dysuria, frequency and hematuria.   Musculoskeletal: Negative for back pain, gait problem, neck pain and neck stiffness.  Skin: Negative for itching and rash.  Neurological: Negative for dizziness, extremity weakness, gait problem, headaches, light-headedness and seizures.  Hematological: Negative for adenopathy. Does not bruise/bleed easily.  Psychiatric/Behavioral: Negative for confusion, depression and sleep disturbance. The patient is not nervous/anxious.    PHYSICAL EXAMINATION:  Blood pressure (!) 120/46, pulse 78, temperature 98.3 F (36.8 C), temperature source Temporal, resp. rate 16, weight 132 lb 9.6 oz (60.1 kg), SpO2 98%.  ECOG PERFORMANCE STATUS: 1  Physical Exam  Constitutional: Oriented to person, place, and time and cachetic appearing male and in no distress.  Head: Normocephalic and  atraumatic.  Mouth/Throat: Oropharynx is clear and moist. No oropharyngeal exudate.  Eyes: Conjunctivae are normal. Right eye exhibits no discharge. Left eye exhibits no discharge. No scleral icterus.  Neck: Normal range of motion. Neck supple.  Cardiovascular: Normal rate, regular rhythm, normal heart sounds and intact distal pulses.   Pulmonary/Chest: Effort normal and breath sounds normal. No respiratory distress. No wheezes. No rales.  Abdominal: Soft. Bowel sounds are normal. Exhibits no distension and no mass. There is no tenderness.  Musculoskeletal: Normal range of motion. Exhibits no edema.  Lymphadenopathy:    No cervical adenopathy.  Neurological: Alert and oriented to person, place, and time. Exhibits muscle wasting. Gait normal. Coordination normal.  Skin: Skin is warm and dry. No rash noted. Not diaphoretic. No erythema. No pallor.  Psychiatric: Mood, memory and judgment normal.  Vitals reviewed.  LABORATORY DATA: Lab Results  Component Value Date   WBC 8.5 07/24/2023   HGB 13.5 07/24/2023   HCT 41.1 07/24/2023  MCV 87.4 07/24/2023   PLT 208 07/24/2023      Chemistry      Component Value Date/Time   NA 140 07/24/2023 1037   K 3.7 07/24/2023 1037   CL 105 07/24/2023 1037   CO2 29 07/24/2023 1037   BUN 17 07/24/2023 1037   CREATININE 0.91 07/24/2023 1037   CREATININE 0.69 (L) 01/08/2021 1553      Component Value Date/Time   CALCIUM  9.7 07/24/2023 1037   ALKPHOS 109 07/24/2023 1037   AST 20 07/24/2023 1037   ALT 19 07/24/2023 1037   BILITOT 0.7 07/24/2023 1037       RADIOGRAPHIC STUDIES:  No results found.   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  He was initially diagnosed in April 2022 with involvement of the skin around the left parotid gland status post left parotidectomy with skin resection followed by adjuvant radiation.  He then had evidence  of disease progression with metastases to the lung in February 2023.   The patient is currently undergoing systemic treatment with immunotherapy with Libtayo  350 mg IV every 3 weeks.  He is status post 33 cycles.  The plan is to complete 2 years of therapy as long as he is tolerating well and there is no evidence of disease progression or unacceptable toxicity.   Labs are acceptable. He will proceed cycle #34 today We will see him in 3 weeks prior to starting cycle #35.   I reinforced to the patient and his wife that he was diagnosed as stage IV due to the pulmonary metastasis as I had some questions about staging.  I also let them know the Dr. Sherrod would order a repeat CT scan at his next appointment.  They had some questions about whether this would include his head.  At a minimum, we will arrange for CT of the neck, chest, abdomen, and pelvis.  Patient had a question regarding next steps in his care after he completes 2 years of therapy.  Neck steps will be based upon the results of his CT scan.   He will continue to follow with dermatology.    He will also continue to follow with ENT and speech therapy regarding his aspiration.  The patient was advised to call immediately if he has any concerning symptoms in the interval. The patient voices understanding of current disease status and treatment options and is in agreement with the current care plan. All questions were answered. The patient knows to call the clinic with any problems, questions or concerns. We can certainly see the patient much sooner if necessary  No orders of the defined types were placed in this encounter.   The total time spent in the appointment was 20-29 minutes  Nour Scalise L Makia Bossi, PA-C 07/24/23

## 2023-07-24 ENCOUNTER — Inpatient Hospital Stay: Payer: Medicare Other

## 2023-07-24 ENCOUNTER — Inpatient Hospital Stay (HOSPITAL_BASED_OUTPATIENT_CLINIC_OR_DEPARTMENT_OTHER): Payer: Medicare Other | Admitting: Physician Assistant

## 2023-07-24 ENCOUNTER — Encounter: Payer: Self-pay | Admitting: Internal Medicine

## 2023-07-24 ENCOUNTER — Inpatient Hospital Stay: Payer: Medicare Other | Attending: Oncology

## 2023-07-24 VITALS — BP 124/60 | HR 71 | Resp 18

## 2023-07-24 VITALS — BP 120/46 | HR 78 | Temp 98.3°F | Resp 16 | Wt 132.6 lb

## 2023-07-24 DIAGNOSIS — C7800 Secondary malignant neoplasm of unspecified lung: Secondary | ICD-10-CM | POA: Diagnosis not present

## 2023-07-24 DIAGNOSIS — C44329 Squamous cell carcinoma of skin of other parts of face: Secondary | ICD-10-CM | POA: Insufficient documentation

## 2023-07-24 DIAGNOSIS — Z5112 Encounter for antineoplastic immunotherapy: Secondary | ICD-10-CM | POA: Insufficient documentation

## 2023-07-24 DIAGNOSIS — C7989 Secondary malignant neoplasm of other specified sites: Secondary | ICD-10-CM | POA: Diagnosis not present

## 2023-07-24 DIAGNOSIS — G620 Drug-induced polyneuropathy: Secondary | ICD-10-CM | POA: Diagnosis not present

## 2023-07-24 DIAGNOSIS — Z79620 Long term (current) use of immunosuppressive biologic: Secondary | ICD-10-CM | POA: Diagnosis not present

## 2023-07-24 LAB — CMP (CANCER CENTER ONLY)
ALT: 19 U/L (ref 0–44)
AST: 20 U/L (ref 15–41)
Albumin: 4.1 g/dL (ref 3.5–5.0)
Alkaline Phosphatase: 109 U/L (ref 38–126)
Anion gap: 6 (ref 5–15)
BUN: 17 mg/dL (ref 8–23)
CO2: 29 mmol/L (ref 22–32)
Calcium: 9.7 mg/dL (ref 8.9–10.3)
Chloride: 105 mmol/L (ref 98–111)
Creatinine: 0.91 mg/dL (ref 0.61–1.24)
GFR, Estimated: >60 mL/min (ref >60)
Glucose, Bld: 202 mg/dL — ABNORMAL HIGH (ref 70–99)
Potassium: 3.7 mmol/L (ref 3.5–5.1)
Sodium: 140 mmol/L (ref 135–145)
Total Bilirubin: 0.7 mg/dL (ref 0.0–1.2)
Total Protein: 7.1 g/dL (ref 6.5–8.1)

## 2023-07-24 LAB — TSH: TSH: 2.008 u[IU]/mL (ref 0.350–4.500)

## 2023-07-24 LAB — CBC WITH DIFFERENTIAL (CANCER CENTER ONLY)
Abs Immature Granulocytes: 0.01 10*3/uL (ref 0.00–0.07)
Basophils Absolute: 0 10*3/uL (ref 0.0–0.1)
Basophils Relative: 0 %
Eosinophils Absolute: 0.2 10*3/uL (ref 0.0–0.5)
Eosinophils Relative: 3 %
HCT: 41.1 % (ref 39.0–52.0)
Hemoglobin: 13.5 g/dL (ref 13.0–17.0)
Immature Granulocytes: 0 %
Lymphocytes Relative: 20 %
Lymphs Abs: 1.7 10*3/uL (ref 0.7–4.0)
MCH: 28.7 pg (ref 26.0–34.0)
MCHC: 32.8 g/dL (ref 30.0–36.0)
MCV: 87.4 fL (ref 80.0–100.0)
Monocytes Absolute: 0.5 10*3/uL (ref 0.1–1.0)
Monocytes Relative: 6 %
Neutro Abs: 6.1 10*3/uL (ref 1.7–7.7)
Neutrophils Relative %: 71 %
Platelet Count: 208 10*3/uL (ref 150–400)
RBC: 4.7 MIL/uL (ref 4.22–5.81)
RDW: 13.2 % (ref 11.5–15.5)
WBC Count: 8.5 10*3/uL (ref 4.0–10.5)
nRBC: 0 % (ref 0.0–0.2)

## 2023-07-24 MED ORDER — SODIUM CHLORIDE 0.9 % IV SOLN: Freq: Once | INTRAVENOUS | Status: AC

## 2023-07-24 MED ORDER — SODIUM CHLORIDE 0.9 % IV SOLN
350.0000 mg | Freq: Once | INTRAVENOUS | Status: AC
  Administered 2023-07-24: 350 mg via INTRAVENOUS
  Filled 2023-07-24: qty 7

## 2023-07-24 NOTE — Patient Instructions (Addendum)
 [facility] - A DEPT OF [facility]  HOSPITAL  Discharge Instructions: Thank you for choosing [facility] to provide your oncology and hematology care.   If you have a lab appointment with the Cancer Center, please go directly to the Cancer Center and check in at the registration area.   Wear comfortable clothing and clothing appropriate for easy access to any Portacath or PICC line.   We strive to give you quality time with your provider. You may need to reschedule your appointment if you arrive late (15 or more minutes).  Arriving late affects you and other patients whose appointments are after yours.  Also, if you miss three or more appointments without notifying the office, you may be dismissed from the clinic at the provider's discretion.      For prescription refill requests, have your pharmacy contact our office and allow 72 hours for refills to be completed.    Today you received the following chemotherapy and/or immunotherapy agent: Cemiplimab  (Libtayo )   To help prevent nausea and vomiting after your treatment, we encourage you to take your nausea medication as directed.  BELOW ARE SYMPTOMS THAT SHOULD BE REPORTED IMMEDIATELY: *FEVER GREATER THAN 100.4 F (38 C) OR HIGHER *CHILLS OR SWEATING *NAUSEA AND VOMITING THAT IS NOT CONTROLLED WITH YOUR NAUSEA MEDICATION *UNUSUAL SHORTNESS OF BREATH *UNUSUAL BRUISING OR BLEEDING *URINARY PROBLEMS (pain or burning when urinating, or frequent urination) *BOWEL PROBLEMS (unusual diarrhea, constipation, pain near the anus) TENDERNESS IN MOUTH AND THROAT WITH OR WITHOUT PRESENCE OF ULCERS (sore throat, sores in mouth, or a toothache) UNUSUAL RASH, SWELLING OR PAIN  UNUSUAL VAGINAL DISCHARGE OR ITCHING   Items with * indicate a potential emergency and should be followed up as soon as possible or go to the Emergency Department if any problems should occur.  Please show the CHEMOTHERAPY ALERT CARD or  IMMUNOTHERAPY ALERT CARD at check-in to the Emergency Department and triage nurse.  Should you have questions after your visit or need to cancel or reschedule your appointment, please contact [facility] A DE[facility]  Dept: [phone]  and follow the prompts.  Office hours are 8:00 a.m. to 4:30 p.m. Monday - Friday. Please note that voicemails left after 4:00 p.m. may not be returned until the following business day.  We are closed weekends and major holidays. You have access to a nurse at all times for urgent questions. Please call the main number to the clinic Dept: [phone] and follow the prompts.   For any non-urgent questions, you may also contact your provider using MyChart. We now offer e-Visits for anyone 37 and older to request care online for non-urgent symptoms. For details visit [email or web address].   Also download the MyChart app! Go to the app store, search "MyChart", open the app, select [facility], and log in with your MyChart username and password.

## 2023-07-25 LAB — T4: T4, Total: 7.1 ug/dL (ref 4.5–12.0)

## 2023-07-31 IMAGING — CT CT CHEST W/ CM
1 of 3 series · 15 of 31 positions shown, 19 images · IV contrast (omnipaque)
Comparison: PET-CT from 10/06/2020

CLINICAL DATA: Assess treatment response. History of head and neck
cancer.

EXAM:
CT CHEST WITH CONTRAST
TECHNIQUE: Multidetector CT imaging of the chest was performed during
intravenous contrast administration.
CONTRAST:  75mL OMNIPAQUE IOHEXOL 350 MG/ML SOLN

[Series 4: lungs · axial · 0.67mm/px · z∈[+742,+1008]mm · 15 of 149 slices shown, 19 images]
[im 8/149  mediastinal]
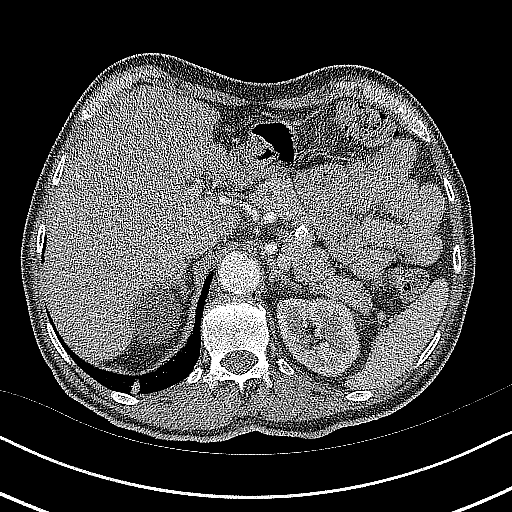
[im 8/149  lung]
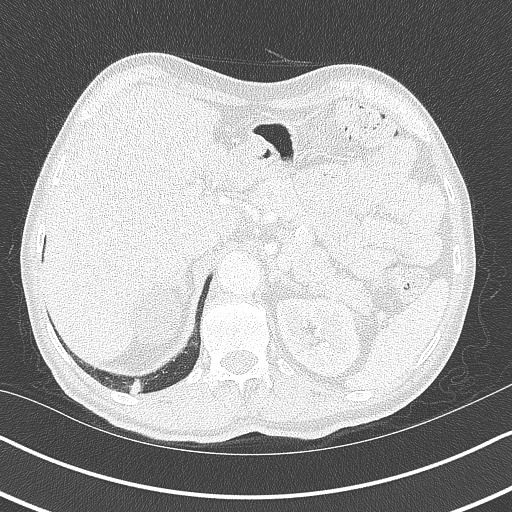
[im 16/149  lung]
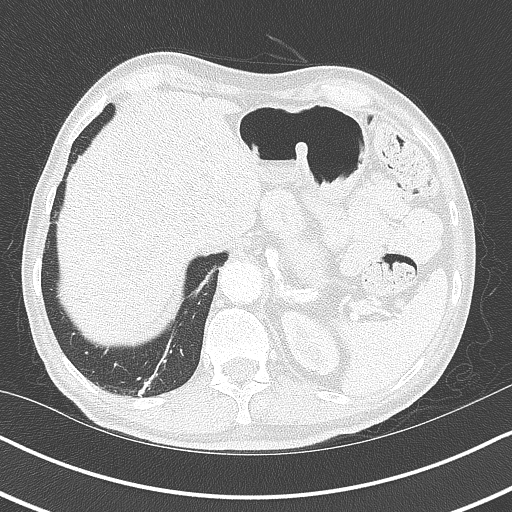
[im 32/149  lung]
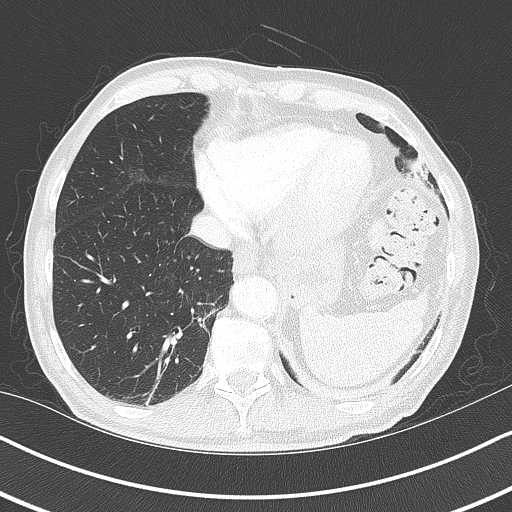
[im 39/149  lung]
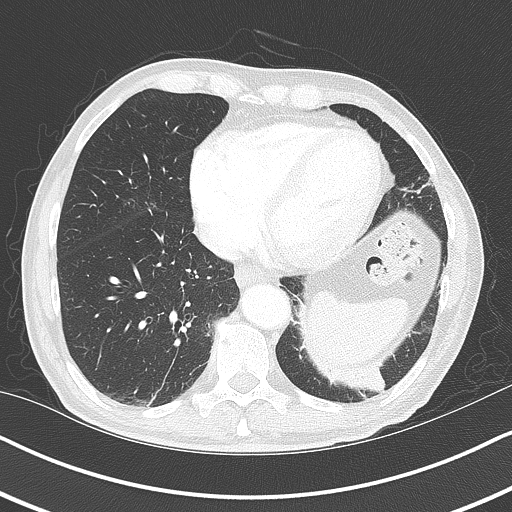
[im 47/149  mediastinal]
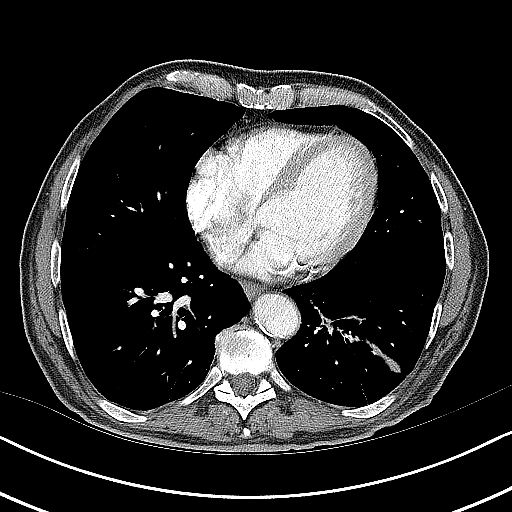
[im 47/149  lung]
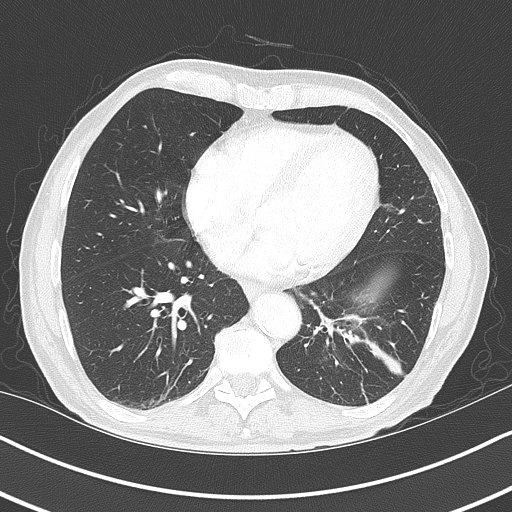
[im 55/149  lung]
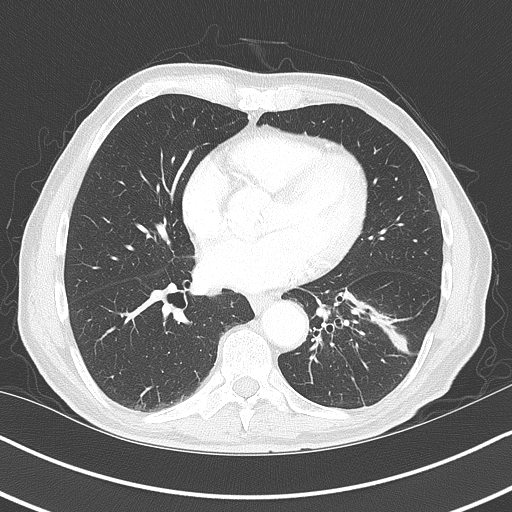
[im 63/149  lung]
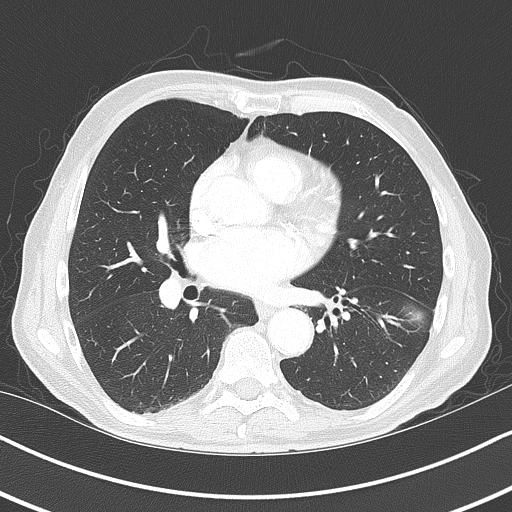
[im 78/149  lung]
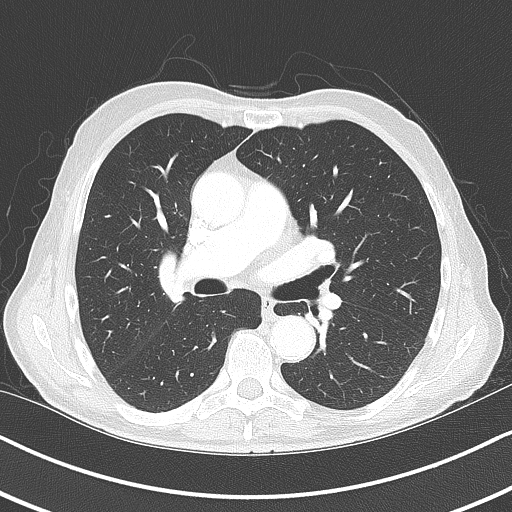
[im 86/149  mediastinal]
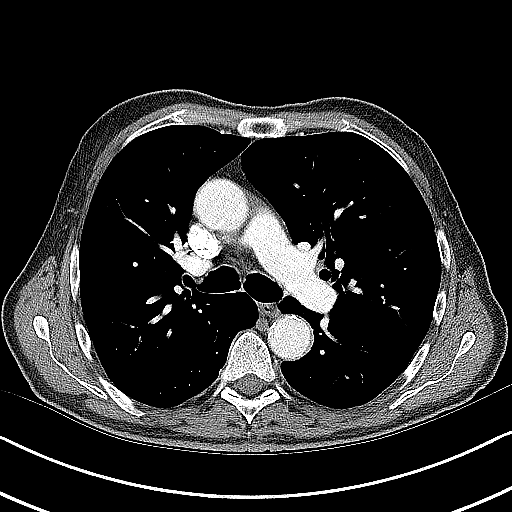
[im 86/149  lung]
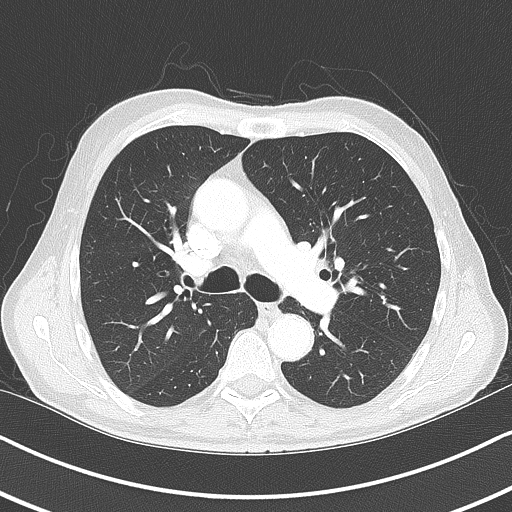
[im 94/149  lung]
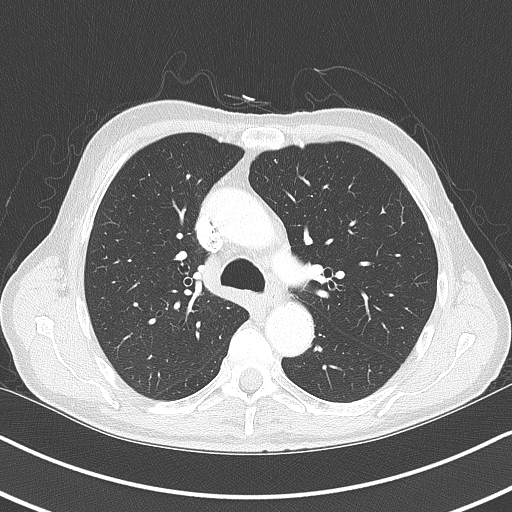
[im 102/149  lung]
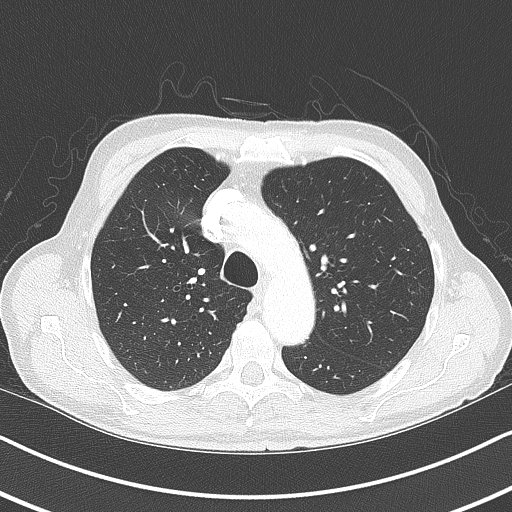
[im 110/149  lung]
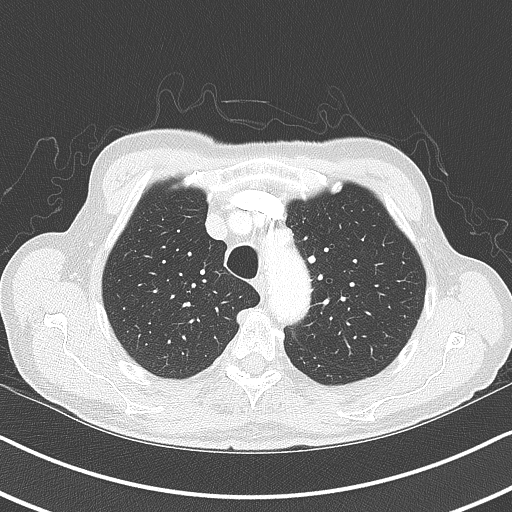
[im 125/149  mediastinal]
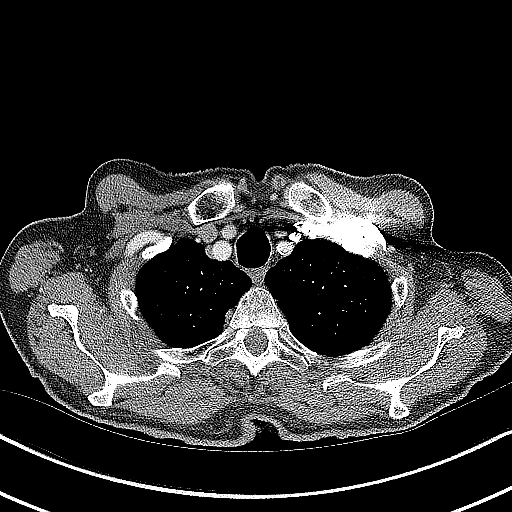
[im 125/149  lung]
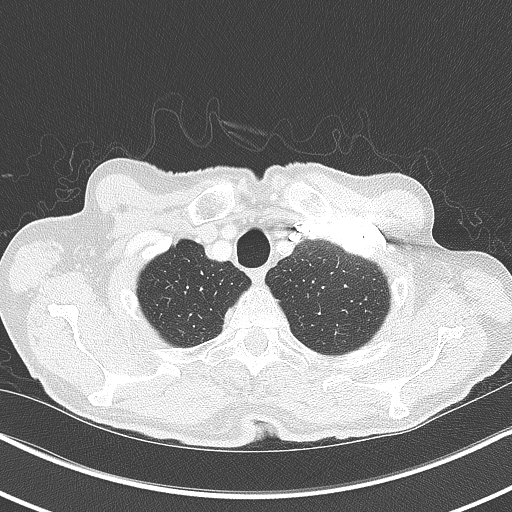
[im 133/149  lung]
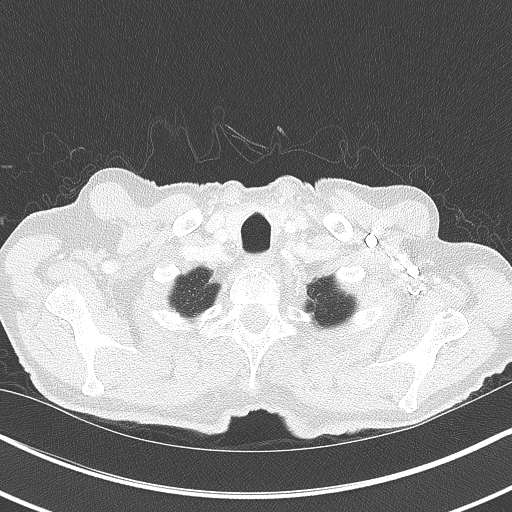
[im 141/149  lung]
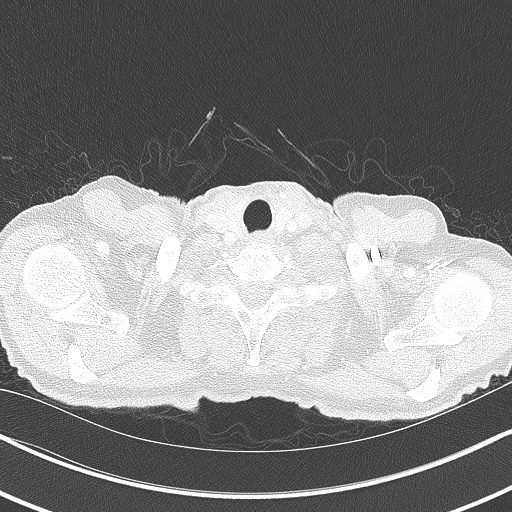

[15 of 31 positions shown; findings below may reference images not displayed]

FINDINGS: Cardiovascular: Normal heart size. Aortic atherosclerosis and
coronary artery calcifications. No pericardial effusion.

Mediastinum/Nodes: No enlarged mediastinal, hilar, or axillary lymph
nodes. Thyroid gland, trachea, and esophagus demonstrate no
significant findings.

Lungs/Pleura: Biapical pleuroparenchymal scarring. No pleural
effusion, airspace consolidation or pneumothorax. Scarring with
volume loss noted within both lower lobes, left greater than right.
Similar to the previous exam. There is a tiny nodule within the
right upper lobe measuring 2 mm, image 46/4. Not confidently
identified on the previous exam. A second tiny nodule is noted in
the right middle lobe measuring 3 mm, image 91/4. Also not
confidently identified on the previous exam

Upper Abdomen: No acute abnormality. Right kidney cyst measures at
least 5.3 cm, image 60/1.

Musculoskeletal: No chest wall abnormality. No acute or significant
osseous findings.
IMPRESSION: 1. No acute cardiopulmonary abnormalities.
2. There are 2 tiny nodules within the right lung measuring up to 3
mm. Not confidently identified on the previous exam. These nodules
have a nonspecific appearance. Today's findings may reflect
differences in technique (prior PET-CT was limited by motion
artifact and the slice thickness was 5 mm versus 2 mm on today's
study.) Consider short-term interval surveillance with repeat CT of
the chest in 3-6 months to assess for temporal change in the
appearance of these nodules.
3. Aortic Atherosclerosis ([id number]). Coronary artery
calcifications.

## 2023-08-05 ENCOUNTER — Other Ambulatory Visit: Payer: Self-pay

## 2023-08-06 ENCOUNTER — Other Ambulatory Visit: Payer: Medicare Other

## 2023-08-13 ENCOUNTER — Ambulatory Visit: Payer: Medicare Other | Admitting: Internal Medicine

## 2023-08-13 DIAGNOSIS — H401123 Primary open-angle glaucoma, left eye, severe stage: Secondary | ICD-10-CM | POA: Diagnosis not present

## 2023-08-13 DIAGNOSIS — H26492 Other secondary cataract, left eye: Secondary | ICD-10-CM | POA: Diagnosis not present

## 2023-08-13 DIAGNOSIS — H401112 Primary open-angle glaucoma, right eye, moderate stage: Secondary | ICD-10-CM | POA: Diagnosis not present

## 2023-08-14 ENCOUNTER — Inpatient Hospital Stay: Payer: Medicare Other

## 2023-08-14 ENCOUNTER — Inpatient Hospital Stay (HOSPITAL_BASED_OUTPATIENT_CLINIC_OR_DEPARTMENT_OTHER): Payer: Medicare Other | Admitting: Internal Medicine

## 2023-08-14 VITALS — BP 112/66 | HR 66 | Temp 98.1°F | Resp 16 | Ht 70.0 in | Wt 134.8 lb

## 2023-08-14 VITALS — BP 112/64 | HR 65 | Temp 97.7°F | Resp 17

## 2023-08-14 DIAGNOSIS — C7800 Secondary malignant neoplasm of unspecified lung: Secondary | ICD-10-CM | POA: Diagnosis not present

## 2023-08-14 DIAGNOSIS — C7989 Secondary malignant neoplasm of other specified sites: Secondary | ICD-10-CM

## 2023-08-14 DIAGNOSIS — C44329 Squamous cell carcinoma of skin of other parts of face: Secondary | ICD-10-CM | POA: Diagnosis not present

## 2023-08-14 DIAGNOSIS — Z5112 Encounter for antineoplastic immunotherapy: Secondary | ICD-10-CM | POA: Diagnosis not present

## 2023-08-14 DIAGNOSIS — Z79620 Long term (current) use of immunosuppressive biologic: Secondary | ICD-10-CM | POA: Diagnosis not present

## 2023-08-14 DIAGNOSIS — G620 Drug-induced polyneuropathy: Secondary | ICD-10-CM | POA: Diagnosis not present

## 2023-08-14 LAB — CMP (CANCER CENTER ONLY)
ALT: 17 U/L (ref 0–44)
AST: 18 U/L (ref 15–41)
Albumin: 3.9 g/dL (ref 3.5–5.0)
Alkaline Phosphatase: 113 U/L (ref 38–126)
Anion gap: 4 — ABNORMAL LOW (ref 5–15)
BUN: 25 mg/dL — ABNORMAL HIGH (ref 8–23)
CO2: 30 mmol/L (ref 22–32)
Calcium: 9.5 mg/dL (ref 8.9–10.3)
Chloride: 105 mmol/L (ref 98–111)
Creatinine: 0.94 mg/dL (ref 0.61–1.24)
GFR, Estimated: >60 mL/min (ref >60)
Glucose, Bld: 128 mg/dL — ABNORMAL HIGH (ref 70–99)
Potassium: 4.2 mmol/L (ref 3.5–5.1)
Sodium: 139 mmol/L (ref 135–145)
Total Bilirubin: 0.7 mg/dL (ref 0.0–1.2)
Total Protein: 6.6 g/dL (ref 6.5–8.1)

## 2023-08-14 LAB — CBC WITH DIFFERENTIAL (CANCER CENTER ONLY)
Abs Immature Granulocytes: 0.01 10*3/uL (ref 0.00–0.07)
Basophils Absolute: 0 10*3/uL (ref 0.0–0.1)
Basophils Relative: 0 %
Eosinophils Absolute: 0.2 10*3/uL (ref 0.0–0.5)
Eosinophils Relative: 4 %
HCT: 37.8 % — ABNORMAL LOW (ref 39.0–52.0)
Hemoglobin: 12.5 g/dL — ABNORMAL LOW (ref 13.0–17.0)
Immature Granulocytes: 0 %
Lymphocytes Relative: 17 %
Lymphs Abs: 1 10*3/uL (ref 0.7–4.0)
MCH: 29.1 pg (ref 26.0–34.0)
MCHC: 33.1 g/dL (ref 30.0–36.0)
MCV: 87.9 fL (ref 80.0–100.0)
Monocytes Absolute: 0.6 10*3/uL (ref 0.1–1.0)
Monocytes Relative: 9 %
Neutro Abs: 4.3 10*3/uL (ref 1.7–7.7)
Neutrophils Relative %: 70 %
Platelet Count: 206 10*3/uL (ref 150–400)
RBC: 4.3 MIL/uL (ref 4.22–5.81)
RDW: 13.5 % (ref 11.5–15.5)
WBC Count: 6.1 10*3/uL (ref 4.0–10.5)
nRBC: 0 % (ref 0.0–0.2)

## 2023-08-14 LAB — TSH: TSH: 2.379 u[IU]/mL (ref 0.350–4.500)

## 2023-08-14 MED ORDER — SODIUM CHLORIDE 0.9 % IV SOLN
350.0000 mg | Freq: Once | INTRAVENOUS | Status: AC
  Administered 2023-08-14: 350 mg via INTRAVENOUS
  Filled 2023-08-14: qty 7

## 2023-08-14 MED ORDER — SODIUM CHLORIDE 0.9 % IV SOLN: Freq: Once | INTRAVENOUS | Status: AC

## 2023-08-14 NOTE — Progress Notes (Signed)
 [facility] [phone]   Fax:([phone]  OFFICE PROGRESS NOTE  Shaun Salisbury, MD [address] 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 He initially presented with skin cancer around the parotid gland in April 2022.   PRIOR THERAPY: 1) status post left parotidectomy with skin resection and rotational flap completed by Dr. Pollyann Kennedy in April 2022. Final pathology showed squamous cell carcinoma.  2) status post radiation therapy under the care of Dr. Basilio Cairo completing 33 fractions on December 19, 2020 for a total of 66 Gray   CURRENT THERAPY: Libtayo (Cempilimab) 350 Mg IV every 3 weeks started August 10, 2021.  Status post 31 cycles of treatment.  INTERVAL HISTORY: Shaun Mcmillan 83 y.o. male returns to the clinic today for follow-up visit accompanied by his wife Shaun Mcmillan.  Discussed the use of AI scribe software for clinical note transcription with the patient, who gave verbal consent to proceed.  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 He is accompanied by his wife, Shaun Mcmillan.  He was diagnosed with stage four squamous cell carcinoma of the skin with pulmonary metastasis in April 2022. The cancer is not curable but has remained stable with ongoing immunotherapy.  He is currently receiving cemiplimab (Libtayo) every three weeks and is on his 35th cycle, marking the completion of two years of treatment. He feels well and reports no side effects from the treatment. No recent chest pain, shortness of breath, cough, nausea, vomiting, rash, or itching.  He has gained approximately two pounds since starting treatment, although his wife notes that his pants are loose, suggesting some weight loss from his  baseline.  He has a history of neuropathy, with symptoms in his foot. This was not addressed during his cancer treatment, and he plans to consult a neurologist now that his cancer treatment is concluding.      MEDICAL HISTORY: Past Medical History:  Diagnosis Date   Arthritis    Diabetes mellitus type II    Glaucoma    sees Dr. 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  MEDICATIONS:  Current Outpatient Medications  Medication Sig Dispense Refill   acetaminophen (TYLENOL) 500 MG tablet Take 1,000 mg by mouth every 8 (eight) hours as needed for moderate pain (pain score 4-6).     amLODipine (NORVASC) 10 MG tablet TAKE 1 TABLET BY MOUTH DAILY 100 tablet 2   aspirin EC 81 MG tablet Take 1 tablet (81 mg total) by mouth daily. Swallow whole. 30 tablet 11   atorvastatin (LIPITOR) 40 MG tablet TAKE 1 TABLET BY MOUTH DAILY 100 tablet 2   Cholecalciferol (VITAMIN D PO) Take 5,000 Units by mouth daily.     gabapentin (NEURONTIN) 300 MG capsule TAKE 2 CAPSULES BY MOUTH 3 TIMES A DAY 180 capsule 1   latanoprost (XALATAN) 0.005 % ophthalmic solution Place 1 drop into both eyes every morning. [facility]  [address]  LORazepam (ATIVAN) 1 MG tablet Take 1 tablet (1 mg total) by mouth 3 (three) times daily as needed for anxiety. 60 tablet 5   losartan (COZAAR) 25 MG tablet TAKE 1 TABLET BY MOUTH DAILY 100 tablet 2   Melatonin 5 MG TABS Take 5 mg by mouth at bedtime. [facility] [address]     metFORMIN (GLUCOPHAGE) 500 MG tablet TAKE 1 TABLET BY MOUTH TWICE  DAILY WITH A MEAL 200 tablet 2   Multiple Vitamin (MULTIVITAMIN) tablet Take 1 tablet by mouth daily.     prochlorperazine (COMPAZINE) 10 MG tablet Take 1 tablet (10 mg total) by mouth every 6 (six) hours as needed for nausea or  vomiting. (Patient not taking: Reported on 07/24/2023) 30 tablet 0   timolol (BETIMOL) 0.5 % ophthalmic solution Place 1 drop into both eyes daily. [facility] [address]     No current facility-administered medications for this visit.    SURGICAL HISTORY:  Past Surgical History:  Procedure Laterality Date   APPENDECTOMY     colonscopy  03/12/2016   per Dr. Ewing Schlein, benign polyps, repeat in 5 yrs   left craniotomy  11/03/08   per Dr. Marikay Alar for a subdrual hematoma   PAROTIDECTOMY Left 09/20/2020   Procedure: PAROTIDECTOMY with resection of skin and rotational flap;  Surgeon: Serena Colonel, MD;  Location: [facility] OR;  Service: ENT;  Laterality: Left;   surgery for ocmpound fracture     right leg    REVIEW OF SYSTEMS:  Constitutional: positive for fatigue Eyes: negative Ears, nose, mouth, throat, and face: negative Respiratory: negative Cardiovascular: negative Gastrointestinal: negative Genitourinary:negative Integument/breast: negative Hematologic/lymphatic: negative Musculoskeletal:negative Neurological: positive for paresthesia Behavioral/Psych: negative Endocrine: negative Allergic/Immunologic: negative   PHYSICAL EXAMINATION: General appearance: alert, cooperative, and no distress Head: Normocephalic, without obvious abnormality, atraumatic Neck: no adenopathy, no JVD, supple, symmetrical, trachea midline, and thyroid not enlarged, symmetric, no tenderness/mass/nodules Lymph nodes: Cervical, supraclavicular, and axillary nodes normal. Resp: clear to auscultation bilaterally Back: symmetric, no curvature. ROM normal. No CVA tenderness. Cardio: regular rate and rhythm, S1, S2 normal, no murmur, click, rub or gallop GI: soft, non-tender; bowel sounds normal; no masses,  no organomegaly Extremities: extremities normal, atraumatic, no cyanosis or edema Neurologic: Alert and oriented X 3, normal strength and tone. Normal symmetric reflexes. Normal coordination and gait  ECOG PERFORMANCE  STATUS: 1 - Symptomatic but completely ambulatory  Blood pressure 112/66, pulse 66, temperature 98.1 F (36.7 C), temperature source Temporal, resp. rate 16, height 5\' 10"  (1.778 m), weight 134 lb 12.8 oz (61.1 kg), SpO2 98%.  LABORATORY DATA: Lab Results  Component Value Date   WBC 6.1 08/14/2023   HGB 12.5 (L) 08/14/2023   HCT 37.8 (L) 08/14/2023   MCV 87.9 08/14/2023   PLT 206 08/14/2023      Chemistry      Component Value Date/Time   NA 139 08/14/2023 1029   K 4.2 08/14/2023 1029   CL 105 08/14/2023 1029   CO2 30 08/14/2023 1029   BUN 25 (H) 08/14/2023 1029   CREATININE 0.94 08/14/2023 1029   CREATININE 0.69 (L) 01/08/2021 1553      Component Value Date/Time   CALCIUM 9.5 08/14/2023 1029   ALKPHOS 113 08/14/2023 1029   AST 18 08/14/2023 1029   ALT 17 08/14/2023 1029   BILITOT 0.7 08/14/2023 1029       RADIOGRAPHIC STUDIES: No results found.  ASSESSMENT AND PLAN: This is a very pleasant 83 years old white male with stage IV squamous  cell carcinoma of the skin with pulmonary metastasis initially diagnosed in April 2022 with involvement of the skin around the left parotid gland status post left parotidectomy with skin resection followed by adjuvant radiotherapy.  He had evidence for disease progression and metastasis to the lung in February 2023. The patient is currently undergoing treatment with immunotherapy with Libtayo (Cempilimab) 350 Mg IV every 3 weeks status post 34 cycles.  The patient has been tolerating this treatment fairly well with no concerning adverse effects.    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 He has been on cemiplimab Shaun Lei) every three weeks, completing 34 cycles with today being the 35th and final cycle. Reports no side effects such as chest pain, shortness of breath, cough, nausea, vomiting, rash, or itching. He has gained  weight recently. The cancer is currently well-managed, and the treatment has been effective in reducing the tumor size. Discussed that stage IV cancer is often not curable but can be managed. Explained that if the cancer shows no growth, it may remain stable for a long time without further treatment. If the cancer shows growth, options include resuming immunotherapy or starting chemotherapy. 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 Reports loss of sensation in his foot. Previous advice was to avoid treatment for neuropathy until cancer treatment was completed. - Refer to neurologist for evaluation and management of peripheral neuropathy  General Health Maintenance Advised to maintain a healthy diet and increase physical activity to gain weight and improve energy levels. - Encourage increased food intake and physical activity  Follow-up - Schedule CT scan one week before the next follow-up appointment - Follow-up appointment in one month to review scan results - Subsequent scans every six months if no disease progression is noted.   The patient was advised to call immediately if he has any other concerning symptoms in the interval. The patient voices understanding of current disease status and treatment options and is in agreement with the current care plan.  All questions were answered. The patient knows to call the clinic with any problems, questions or concerns. We can certainly see the patient much sooner if necessary.  The total time spent in the appointment was 35 minutes.  Disclaimer: This note was dictated with  voice recognition software. Similar sounding words can inadvertently be transcribed and may not be corrected upon review.

## 2023-08-14 NOTE — Patient Instructions (Signed)
 [facility] - A DEPT OF [facility]  Discharge Instructions: Thank you for choosing [facility] to provide your oncology and hematology care.   If you have a lab appointment with the Cancer Center, please go directly to the Cancer Center and check in at the registration area.   Wear comfortable clothing and clothing appropriate for easy access to any Portacath or PICC line.   We strive to give you quality time with your provider. You may need to reschedule your appointment if you arrive late (15 or more minutes).  Arriving late affects you and other patients whose appointments are after yours.  Also, if you miss three or more appointments without notifying the office, you may be dismissed from the clinic at the provider's discretion.      For prescription refill requests, have your pharmacy contact our office and allow 72 hours for refills to be completed.    Today you received the following chemotherapy and/or immunotherapy agents: Libtayo.       To help prevent nausea and vomiting after your treatment, we encourage you to take your nausea medication as directed.  BELOW ARE SYMPTOMS THAT SHOULD BE REPORTED IMMEDIATELY: *FEVER GREATER THAN 100.4 F (38 C) OR HIGHER *CHILLS OR SWEATING *NAUSEA AND VOMITING THAT IS NOT CONTROLLED WITH YOUR NAUSEA MEDICATION *UNUSUAL SHORTNESS OF BREATH *UNUSUAL BRUISING OR BLEEDING *URINARY PROBLEMS (pain or burning when urinating, or frequent urination) *BOWEL PROBLEMS (unusual diarrhea, constipation, pain near the anus) TENDERNESS IN MOUTH AND THROAT WITH OR WITHOUT PRESENCE OF ULCERS (sore throat, sores in mouth, or a toothache) UNUSUAL RASH, SWELLING OR PAIN  UNUSUAL VAGINAL DISCHARGE OR ITCHING   Items with * indicate a potential emergency and should be followed up as soon as possible or go to the Emergency Department if any problems should occur.  Please show the CHEMOTHERAPY ALERT CARD or IMMUNOTHERAPY  ALERT CARD at check-in to the Emergency Department and triage nurse.  Should you have questions after your visit or need to cancel or reschedule your appointment, please contact [facility] - A DEPT [facility]  Dept: [phone]  and follow the prompts.  Office hours are 8:00 a.m. to 4:30 p.m. Monday - Friday. Please note that voicemails left after 4:00 p.m. may not be returned until the following business day.  We are closed weekends and major holidays. You have access to a nurse at all times for urgent questions. Please call the main number to the clinic Dept: [phone] and follow the prompts.   For any non-urgent questions, you may also contact your provider using MyChart. We now offer e-Visits for anyone 71 and older to request care online for non-urgent symptoms. For details visit [email or web address].   Also download the MyChart app! Go to the app store, search "MyChart", open the app, select [facility], and log in with your MyChart username and password.

## 2023-08-15 ENCOUNTER — Other Ambulatory Visit: Payer: Self-pay

## 2023-08-15 LAB — T4: T4, Total: 6.1 ug/dL (ref 4.5–12.0)

## 2023-08-16 ENCOUNTER — Other Ambulatory Visit: Payer: Self-pay

## 2023-08-20 DIAGNOSIS — H26492 Other secondary cataract, left eye: Secondary | ICD-10-CM | POA: Diagnosis not present

## 2023-08-27 DIAGNOSIS — H26491 Other secondary cataract, right eye: Secondary | ICD-10-CM | POA: Diagnosis not present

## 2023-09-03 ENCOUNTER — Ambulatory Visit (HOSPITAL_COMMUNITY)
Admission: RE | Admit: 2023-09-03 | Discharge: 2023-09-03 | Disposition: A | Discharge status: Home / Self Care | Payer: Medicare Other | Source: Ambulatory Visit | Attending: Internal Medicine | Admitting: Internal Medicine

## 2023-09-03 ENCOUNTER — Encounter (HOSPITAL_COMMUNITY): Payer: Self-pay

## 2023-09-03 ENCOUNTER — Inpatient Hospital Stay: Attending: Internal Medicine

## 2023-09-03 ENCOUNTER — Other Ambulatory Visit: Payer: Medicare Other

## 2023-09-03 DIAGNOSIS — C4490 Unspecified malignant neoplasm of skin, unspecified: Secondary | ICD-10-CM | POA: Diagnosis not present

## 2023-09-03 DIAGNOSIS — G9389 Other specified disorders of brain: Secondary | ICD-10-CM | POA: Diagnosis not present

## 2023-09-03 DIAGNOSIS — C771 Secondary and unspecified malignant neoplasm of intrathoracic lymph nodes: Secondary | ICD-10-CM | POA: Diagnosis not present

## 2023-09-03 DIAGNOSIS — C44329 Squamous cell carcinoma of skin of other parts of face: Secondary | ICD-10-CM | POA: Insufficient documentation

## 2023-09-03 DIAGNOSIS — Z79899 Other long term (current) drug therapy: Secondary | ICD-10-CM | POA: Insufficient documentation

## 2023-09-03 DIAGNOSIS — C7989 Secondary malignant neoplasm of other specified sites: Secondary | ICD-10-CM | POA: Insufficient documentation

## 2023-09-03 DIAGNOSIS — C801 Malignant (primary) neoplasm, unspecified: Secondary | ICD-10-CM | POA: Diagnosis not present

## 2023-09-03 DIAGNOSIS — C7800 Secondary malignant neoplasm of unspecified lung: Secondary | ICD-10-CM | POA: Diagnosis not present

## 2023-09-03 DIAGNOSIS — C7802 Secondary malignant neoplasm of left lung: Secondary | ICD-10-CM | POA: Diagnosis not present

## 2023-09-03 DIAGNOSIS — C4492 Squamous cell carcinoma of skin, unspecified: Secondary | ICD-10-CM | POA: Diagnosis not present

## 2023-09-03 LAB — CMP (CANCER CENTER ONLY)
ALT: 20 U/L (ref 0–44)
AST: 18 U/L (ref 15–41)
Albumin: 4.4 g/dL (ref 3.5–5.0)
Alkaline Phosphatase: 115 U/L (ref 38–126)
Anion gap: 6 (ref 5–15)
BUN: 16 mg/dL (ref 8–23)
CO2: 30 mmol/L (ref 22–32)
Calcium: 9.9 mg/dL (ref 8.9–10.3)
Chloride: 103 mmol/L (ref 98–111)
Creatinine: 0.74 mg/dL (ref 0.61–1.24)
GFR, Estimated: >60 mL/min (ref >60)
Glucose, Bld: 109 mg/dL — ABNORMAL HIGH (ref 70–99)
Potassium: 4 mmol/L (ref 3.5–5.1)
Sodium: 139 mmol/L (ref 135–145)
Total Bilirubin: 1 mg/dL (ref 0.0–1.2)
Total Protein: 7.2 g/dL (ref 6.5–8.1)

## 2023-09-03 LAB — TSH: TSH: 2.504 u[IU]/mL (ref 0.350–4.500)

## 2023-09-03 LAB — CBC WITH DIFFERENTIAL (CANCER CENTER ONLY)
Abs Immature Granulocytes: 0.03 10*3/uL (ref 0.00–0.07)
Basophils Absolute: 0 10*3/uL (ref 0.0–0.1)
Basophils Relative: 0 %
Eosinophils Absolute: 0.1 10*3/uL (ref 0.0–0.5)
Eosinophils Relative: 1 %
HCT: 40.2 % (ref 39.0–52.0)
Hemoglobin: 13.7 g/dL (ref 13.0–17.0)
Immature Granulocytes: 0 %
Lymphocytes Relative: 13 %
Lymphs Abs: 1.3 10*3/uL (ref 0.7–4.0)
MCH: 29.7 pg (ref 26.0–34.0)
MCHC: 34.1 g/dL (ref 30.0–36.0)
MCV: 87.2 fL (ref 80.0–100.0)
Monocytes Absolute: 0.5 10*3/uL (ref 0.1–1.0)
Monocytes Relative: 5 %
Neutro Abs: 8.7 10*3/uL — ABNORMAL HIGH (ref 1.7–7.7)
Neutrophils Relative %: 81 %
Platelet Count: 234 10*3/uL (ref 150–400)
RBC: 4.61 MIL/uL (ref 4.22–5.81)
RDW: 14.1 % (ref 11.5–15.5)
WBC Count: 10.7 10*3/uL — ABNORMAL HIGH (ref 4.0–10.5)
nRBC: 0 % (ref 0.0–0.2)

## 2023-09-03 MED ORDER — SODIUM CHLORIDE (PF) 0.9 % IJ SOLN
INTRAMUSCULAR | Status: AC
  Filled 2023-09-03: qty 50

## 2023-09-03 MED ORDER — IOHEXOL 300 MG/ML  SOLN
100.0000 mL | Freq: Once | INTRAMUSCULAR | Status: AC | PRN
  Administered 2023-09-03: 75 mL via INTRAVENOUS

## 2023-09-04 LAB — T4: T4, Total: 7.9 ug/dL (ref 4.5–12.0)

## 2023-09-11 ENCOUNTER — Inpatient Hospital Stay (HOSPITAL_BASED_OUTPATIENT_CLINIC_OR_DEPARTMENT_OTHER): Payer: Medicare Other | Admitting: Internal Medicine

## 2023-09-11 ENCOUNTER — Other Ambulatory Visit: Payer: Self-pay

## 2023-09-11 ENCOUNTER — Telehealth: Payer: Self-pay | Admitting: Physician Assistant

## 2023-09-11 VITALS — BP 135/43 | HR 74 | Temp 98.0°F | Resp 17 | Ht 70.0 in | Wt 133.6 lb

## 2023-09-11 DIAGNOSIS — C7800 Secondary malignant neoplasm of unspecified lung: Secondary | ICD-10-CM | POA: Diagnosis not present

## 2023-09-11 DIAGNOSIS — G9389 Other specified disorders of brain: Secondary | ICD-10-CM | POA: Diagnosis not present

## 2023-09-11 DIAGNOSIS — Z79899 Other long term (current) drug therapy: Secondary | ICD-10-CM | POA: Diagnosis not present

## 2023-09-11 DIAGNOSIS — C7989 Secondary malignant neoplasm of other specified sites: Secondary | ICD-10-CM | POA: Diagnosis not present

## 2023-09-11 DIAGNOSIS — C44329 Squamous cell carcinoma of skin of other parts of face: Secondary | ICD-10-CM | POA: Diagnosis not present

## 2023-09-11 NOTE — Telephone Encounter (Signed)
 Attempted to contact the patient to inform him of appointment details. Was unable to leave a voicemail. The patient will be mailed an appointment reminder.

## 2023-09-11 NOTE — Progress Notes (Signed)
 [facility] [phone]   [phone]  OFFICE PROGRESS NOTE  Shaun Salisbury, MD [address] 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 He initially presented with skin cancer around the parotid gland in April 2022.   PRIOR THERAPY: 1) status post left parotidectomy with skin resection and rotational flap completed by Dr. Pollyann Kennedy in April 2022. Final pathology showed squamous cell carcinoma.  2) status post radiation therapy under the care of Dr. Basilio Cairo completing 33 fractions on December 19, 2020 for a total of 66 Gray   CURRENT THERAPY: Libtayo (Cempilimab) 350 Mg IV every 3 weeks started August 10, 2021.  Status post 35  cycles of treatment.  INTERVAL HISTORY: Shaun Mcmillan 83 y.o. male returns to the clinic today for follow-up visit accompanied by his wife Shaun Mcmillan.Discussed the use of AI scribe software for clinical note transcription with the patient, who gave verbal consent to proceed.  History of Present Illness   Shaun Mcmillan "Ferne Reus" is an 83 year old male with stage four squamous cell carcinoma of the skin with pulmonary involvement who presents for evaluation and repeat imaging studies.  He has stage four squamous cell carcinoma of the skin with pulmonary involvement, diagnosed in 2023. He completed two years of immunotherapy with Libtayo a month ago. Recent imaging of the chest shows a nodule in the left hilar area, which appears larger compared to previous scans. The nodule is described as necrotic. Further imaging of the neck and abdomen is pending.  Since the last visit, no new symptoms or side effects have been noted. No chest pain, breathing issues, nausea, vomiting, rash, or itching. However, a caregiver notes that he sometimes breathes deeply at night, which stops when his chest is rubbed.       MEDICAL HISTORY: Past Medical History:   Diagnosis Date   Arthritis    Diabetes mellitus type II    Glaucoma    sees Dr. 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  MEDICATIONS:  Current Outpatient Medications  Medication Sig Dispense Refill   acetaminophen (TYLENOL) 500 MG tablet Take 1,000 mg by mouth every 8 (eight) hours as needed for moderate pain (pain score 4-6).     amLODipine (NORVASC) 10 MG tablet TAKE 1 TABLET BY MOUTH DAILY 100 tablet 2   aspirin EC 81 MG tablet Take 1 tablet (81 mg total) by mouth daily. Swallow whole. 30 tablet 11   atorvastatin (LIPITOR) 40 MG tablet TAKE 1 TABLET BY MOUTH DAILY 100 tablet 2   Cholecalciferol (VITAMIN D PO) Take 5,000 Units by mouth daily.     gabapentin (NEURONTIN) 300 MG capsule TAKE 2 CAPSULES BY MOUTH 3 TIMES A DAY 180 capsule 1   latanoprost (XALATAN) 0.005 % ophthalmic solution Place 1 drop into both eyes every morning. [facility]  [address]     LORazepam (ATIVAN) 1 MG tablet Take 1 tablet (1 mg total) by mouth 3 (three) times daily as needed for anxiety. 60 tablet 5   losartan (COZAAR) 25 MG tablet TAKE 1 TABLET BY MOUTH DAILY 100 tablet 2  Melatonin 5 MG TABS Take 5 mg by mouth at bedtime. [facility] [address]     metFORMIN (GLUCOPHAGE) 500 MG tablet TAKE 1 TABLET BY MOUTH TWICE  DAILY WITH A MEAL 200 tablet 2   Multiple Vitamin (MULTIVITAMIN) tablet Take 1 tablet by mouth daily.     prochlorperazine (COMPAZINE) 10 MG tablet Take 1 tablet (10 mg total) by mouth every 6 (six) hours as needed for nausea or vomiting. (Patient not taking: Reported on 07/24/2023) 30 tablet 0   timolol (BETIMOL) 0.5 % ophthalmic solution Place 1 drop into both eyes daily. [facility] [address]     No current facility-administered medications for this visit.    SURGICAL HISTORY:  Past  Surgical History:  Procedure Laterality Date   APPENDECTOMY     colonscopy  03/12/2016   per Dr. Ewing Schlein, benign polyps, repeat in 5 yrs   left craniotomy  11/03/08   per Dr. Marikay Alar for a subdrual hematoma   PAROTIDECTOMY Left 09/20/2020   Procedure: PAROTIDECTOMY with resection of skin and rotational flap;  Surgeon: Serena Colonel, MD;  Location: [facility] OR;  Service: ENT;  Laterality: Left;   surgery for ocmpound fracture     right leg    REVIEW OF SYSTEMS:  Constitutional: negative Eyes: negative Ears, nose, mouth, throat, and face: negative Respiratory: negative Cardiovascular: negative Gastrointestinal: negative Genitourinary:negative Integument/breast: negative Hematologic/lymphatic: negative Musculoskeletal:negative Neurological: positive for paresthesia Behavioral/Psych: negative Endocrine: negative Allergic/Immunologic: negative   PHYSICAL EXAMINATION: General appearance: alert, cooperative, and no distress Head: Normocephalic, without obvious abnormality, atraumatic Neck: no adenopathy, no JVD, supple, symmetrical, trachea midline, and thyroid not enlarged, symmetric, no tenderness/mass/nodules Lymph nodes: Cervical, supraclavicular, and axillary nodes normal. Resp: clear to auscultation bilaterally Back: symmetric, no curvature. ROM normal. No CVA tenderness. Cardio: regular rate and rhythm, S1, S2 normal, no murmur, click, rub or gallop GI: soft, non-tender; bowel sounds normal; no masses,  no organomegaly Extremities: extremities normal, atraumatic, no cyanosis or edema Neurologic: Alert and oriented X 3, normal strength and tone. Normal symmetric reflexes. Normal coordination and gait  ECOG PERFORMANCE STATUS: 1 - Symptomatic but completely ambulatory  Blood pressure (!) 135/43, pulse 74, temperature 98 F (36.7 C), temperature source Temporal, resp. rate 17, height 5\' 10"  (1.778 m), weight 133 lb 9.6 oz (60.6 kg), SpO2 100%.  LABORATORY DATA: Lab Results   Component Value Date   WBC 10.7 (H) 09/03/2023   HGB 13.7 09/03/2023   HCT 40.2 09/03/2023   MCV 87.2 09/03/2023   PLT 234 09/03/2023      Chemistry      Component Value Date/Time   NA 139 09/03/2023 1302   K 4.0 09/03/2023 1302   CL 103 09/03/2023 1302   CO2 30 09/03/2023 1302   BUN 16 09/03/2023 1302   CREATININE 0.74 09/03/2023 1302   CREATININE 0.69 (L) 01/08/2021 1553      Component Value Date/Time   CALCIUM 9.9 09/03/2023 1302   ALKPHOS 115 09/03/2023 1302   AST 18 09/03/2023 1302   ALT 20 09/03/2023 1302   BILITOT 1.0 09/03/2023 1302       RADIOGRAPHIC STUDIES: CT Chest W Contrast Result Date: 09/10/2023 CLINICAL DATA:  Skin cancer, 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 * Tracking Code: BO * EXAM: CT CHEST WITH CONTRAST TECHNIQUE: Multidetector CT imaging of the chest was performed during intravenous contrast administration. RADIATION DOSE REDUCTION: This exam was performed according to the departmental dose-optimization program which includes automated exposure control, adjustment of the mA and/or kV according to  patient size and/or use of iterative reconstruction technique. CONTRAST:  75mL OMNIPAQUE IOHEXOL 300 MG/ML  SOLN COMPARISON:  04/24/2023. FINDINGS: Cardiovascular: Atherosclerotic calcification of the aorta, aortic valve and coronary arteries. Heart is at the upper limits of normal in size. Left ventricle appears dilated. No pericardial effusion. Enlarged right and left pulmonary arteries. Mediastinum/Nodes: Necrotic left hilar adenopathy measures 1.9 x 3.5 cm. Mediastinal lymph nodes are not enlarged by CT size criteria. No axillary adenopathy. Esophagus is grossly unremarkable. Lungs/Pleura: Mild bibasilar subsegmental volume loss. Calcified granulomas. 5 mm lingular nodule (5/79), stable. Narrowing of the left upper lobe bronchus with obstruction of the lingular bronchus and associated mucoid impaction and peribronchovascular nodularity in the lingula. No pleural  fluid. Airway is otherwise unremarkable. Upper Abdomen: Blush of hyperattenuation in the dome of the right hepatic lobe, possibly a perfusion anomaly. Low-attenuation lesions in the right kidney. No specific follow-up necessary. Visualized portions of the liver, gallbladder, adrenal glands, kidneys, spleen, pancreas, stomach and bowel are otherwise grossly unremarkable. No upper abdominal adenopathy. Musculoskeletal: Degenerative changes in the spine. IMPRESSION: 1. Metastatic left hilar adenopathy with obstruction of the lingular bronchus and associated mucoid impaction and peribronchovascular nodularity in the lingula. No additional evidence of metastatic disease. 2. Aortic atherosclerosis (ICD10-I70.0). Coronary artery calcification. 3. Enlarged right and left pulmonary arteries, indicative of pulmonary arterial hypertension. Electronically Signed   By: Leanna Battles M.D.   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  He had evidence for disease progression and metastasis to the lung in February 2023. The patient is currently undergoing treatment with immunotherapy with Libtayo (Cempilimab) 350 Mg IV every 3 weeks status post 35 cycles.  The patient has been tolerating this treatment fairly well with no concerning adverse effects. The patient had repeat CT scan of the head, neck and chest performed recently.  The report for the head and neck are still pending but the CT scan of the chest showed the metastatic left hilar adenopathy with obstruction of the lingular bronchus and associated mucoid impaction.    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 Completed two years of immunotherapy with Libtayo one month ago. Current imaging shows a larger, necrotic lymph node in the left hilar area, suggesting possible disease activity. Awaiting further imaging results of the neck and abdomen to assess for additional metastatic sites. PET scan is planned to evaluate the extent of disease activity. If the PET scan confirms isolated active disease in the left hilar lymph node, stereotactic radiation therapy may be considered. 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 Previous treatment for squamous cell carcinoma.   He was advised to call immediately if he has any other concerning symptoms in the interval. The patient voices understanding of  current disease status and treatment options and is in agreement with the current care plan.  All questions were answered. The patient knows to call the clinic with any problems, questions or concerns. We can certainly see the patient much sooner if necessary.  The total time spent in the appointment was 30 minutes.  Disclaimer: This note was dictated with voice recognition software. Similar sounding words can inadvertently be transcribed and may not be corrected upon review.

## 2023-09-12 ENCOUNTER — Other Ambulatory Visit: Payer: Self-pay

## 2023-09-18 ENCOUNTER — Other Ambulatory Visit: Payer: Self-pay | Admitting: Family Medicine

## 2023-09-19 ENCOUNTER — Encounter (HOSPITAL_COMMUNITY)
Admission: RE | Admit: 2023-09-19 | Discharge: 2023-09-19 | Disposition: A | Discharge status: Home / Self Care | Source: Ambulatory Visit | Attending: Internal Medicine | Admitting: Internal Medicine

## 2023-09-19 DIAGNOSIS — C07 Malignant neoplasm of parotid gland: Secondary | ICD-10-CM | POA: Diagnosis not present

## 2023-09-19 DIAGNOSIS — C7989 Secondary malignant neoplasm of other specified sites: Secondary | ICD-10-CM | POA: Insufficient documentation

## 2023-09-19 LAB — GLUCOSE, CAPILLARY: Glucose-Capillary: 116 mg/dL — ABNORMAL HIGH (ref 70–99)

## 2023-09-19 MED ORDER — FLUDEOXYGLUCOSE F - 18 (FDG) INJECTION
6.1000 | Freq: Once | INTRAVENOUS | Status: AC | PRN
Start: 2023-09-19 — End: 2023-09-19
  Administered 2023-09-19: 6.01 via INTRAVENOUS

## 2023-09-22 NOTE — Progress Notes (Unsigned)
 [facility] OFFICE PROGRESS NOTE  [name], MD [address] 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 He initially presented with skin cancer around the parotid gland in April 2022.   PRIOR THERAPY: 1) status post left parotidectomy with skin resection and rotational flap completed by Dr. Pollyann Kennedy in April 2022. Final pathology showed squamous cell carcinoma.  2) status post radiation therapy under the care of Dr. Basilio Cairo completing 33 fractions on December 19, 2020 for a total of 66 Gray  3) Libtayo (Cempilimab) 350 Mg IV every 3 weeks started August 10, 2021.  Status post 35  cycles of treatment.   CURRENT THERAPY: None  INTERVAL HISTORY: Shaun Mcmillan 83 y.o. male returns to the clinic today for a follow-up visit accompanied by his wife. The patient was last seen by Dr. Arbutus Ped 3 weeks ago. The patient completed his 35th cycle of immunotherapy.  At his last appointments, the patent had a CT that showed a larger, necrotic lymph node in the left hilar area, suggesting possible disease activity. Dr. Arbutus Ped recommended a PET scan to further evaluate this.   Today, he denies changes in his health. He denies any fever, chills, or night sweats. His appetite is "Ok". He sees ENT and had a swallow study performed by a speech therapist previously for his aspiration/dysphagia. They told him to tuck his chin to the right when he swallows which has helped. He does sometimes cough after he eats but otherwise denies cough. Denies any chest pain, shortness of breath, or hemoptysis. Denies any nausea, vomiting, diarrhea, or constipation.  Denies any headache or visual changes.  Denies any rashes or skin changes. He sees Dr. Yetta Barre from dermatology, the most recent visit was yesterday. He is here today for evaluation and to review his PET scan results.    MEDICAL HISTORY: Past Medical History:   Diagnosis Date   Arthritis    Diabetes mellitus type II    Glaucoma    sees Dr. 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  MEDICATIONS:  Current Outpatient Medications  Medication Sig Dispense Refill   acetaminophen (TYLENOL) 500 MG tablet Take 1,000 mg by mouth every 8 (eight) hours as needed for moderate pain (pain score 4-6).     amLODipine (NORVASC) 10 MG tablet TAKE 1 TABLET BY MOUTH DAILY 100 tablet 2   aspirin EC 81 MG tablet Take 1 tablet (81 mg total) by mouth daily. Swallow whole. 30 tablet 11   atorvastatin (LIPITOR) 40 MG tablet TAKE 1 TABLET BY MOUTH DAILY 100 tablet 2   Cholecalciferol (VITAMIN D PO) Take 5,000 Units by mouth daily.     gabapentin (NEURONTIN) 300 MG capsule TAKE 2 CAPSULES BY MOUTH 3 TIMES A DAY 180 capsule 1   latanoprost (XALATAN) 0.005 % ophthalmic solution Place 1 drop into both eyes every morning. [facility]  [address]     LORazepam (ATIVAN) 1 MG tablet Take 1 tablet (1 mg total) by mouth 3 (three) times daily as needed for anxiety. 60 tablet 5   losartan (COZAAR) 25 MG tablet TAKE 1 TABLET BY  MOUTH DAILY 100 tablet 2   Melatonin 5 MG TABS Take 5 mg by mouth at bedtime. [facility] [address]     metFORMIN (GLUCOPHAGE) 500 MG tablet TAKE 1 TABLET BY MOUTH TWICE  DAILY WITH A MEAL 200 tablet 2   Multiple Vitamin (MULTIVITAMIN) tablet Take 1 tablet by mouth daily.     prochlorperazine (COMPAZINE) 10 MG tablet Take 1 tablet (10 mg total) by mouth every 6 (six) hours as needed for nausea or vomiting. (Patient not taking: Reported on 07/24/2023) 30 tablet 0   timolol (BETIMOL) 0.5 % ophthalmic solution Place 1 drop into both eyes daily. [facility] [address]     No current facility-administered medications for this visit.    SURGICAL HISTORY:  Past  Surgical History:  Procedure Laterality Date   APPENDECTOMY     colonscopy  03/12/2016   per Dr. Ewing Schlein, benign polyps, repeat in 5 yrs   left craniotomy  11/03/08   per Dr. Marikay Alar for a subdrual hematoma   PAROTIDECTOMY Left 09/20/2020   Procedure: PAROTIDECTOMY with resection of skin and rotational flap;  Surgeon: Serena Colonel, MD;  Location: [facility] OR;  Service: ENT;  Laterality: Left;   surgery for ocmpound fracture     right leg    REVIEW OF SYSTEMS:   Constitutional: Positive for stable fatigue. Negative for chills, fatigue, fever and unexpected weight change.  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 Positive for stable dysphagia. Negative for mouth sores, nosebleeds, sore throat.  Eyes: Negative for eye problems and icterus.  Respiratory: Negative for hemoptysis, cough, shortness of breath and wheezing.   Cardiovascular: Negative for chest pain and leg swelling.  Gastrointestinal: Negative for abdominal pain, constipation, diarrhea, nausea and vomiting.  Genitourinary: Negative for bladder incontinence, difficulty urinating, dysuria, frequency and hematuria.   Musculoskeletal: Negative for back pain, gait problem, neck pain and neck stiffness.  Skin: Negative for itching and rash.  Neurological: Negative for dizziness, extremity weakness, gait problem, headaches, light-headedness and seizures.  Hematological: Negative for adenopathy. Does not bruise/bleed easily.  Psychiatric/Behavioral: Negative for confusion, depression and sleep disturbance. The patient is not nervous/anxious.    PHYSICAL EXAMINATION:  There were no vitals taken for this visit.  ECOG PERFORMANCE STATUS: 1  Physical Exam  Constitutional: Oriented to person, place, and time and cachetic appearing male and in no distress.  Head: Normocephalic and atraumatic.  Mouth/Throat: Oropharynx is clear and moist. No oropharyngeal exudate.  Eyes: Conjunctivae are normal. Right eye exhibits no  discharge. Left eye exhibits no discharge. No scleral icterus.  Neck: Normal range of motion. Neck supple.  Cardiovascular: Normal rate, regular rhythm, normal heart sounds and intact distal pulses.   Pulmonary/Chest: Effort normal and breath sounds normal. He had some mucus that cleared with coughing. No respiratory distress. No wheezes. No rales.  Abdominal: Soft. Bowel sounds are normal. Exhibits no distension and no mass. There is no tenderness.  Musculoskeletal: Normal range of motion. Exhibits no edema.  Lymphadenopathy:    No cervical adenopathy.  Neurological: Alert and oriented to person, place, and time. Exhibits muscle wasting. Gait normal. Coordination normal.  Skin: Skin is warm and dry. No rash noted. Not diaphoretic. No erythema. No pallor.  Psychiatric: Mood, memory and judgment normal.  Vitals reviewed.  LABORATORY DATA: Lab Results  Component Value Date   WBC 10.7 (H) 09/03/2023   HGB 13.7 09/03/2023   HCT 40.2 09/03/2023   MCV 87.2 09/03/2023   PLT 234 09/03/2023  Chemistry      Component Value Date/Time   NA 139 09/03/2023 1302   K 4.0 09/03/2023 1302   CL 103 09/03/2023 1302   CO2 30 09/03/2023 1302   BUN 16 09/03/2023 1302   CREATININE 0.74 09/03/2023 1302   CREATININE 0.69 (L) 01/08/2021 1553      Component Value Date/Time   CALCIUM 9.9 09/03/2023 1302   ALKPHOS 115 09/03/2023 1302   AST 18 09/03/2023 1302   ALT 20 09/03/2023 1302   BILITOT 1.0 09/03/2023 1302       RADIOGRAPHIC STUDIES:  CT HEAD W & WO CONTRAST ( ) Result Date: 09/12/2023 CLINICAL DATA:  83 year old male with skin cancer, 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 Restaging EXAM: CT HEAD WITHOUT AND WITH CONTRAST TECHNIQUE: Contiguous axial images were obtained from the base of the skull through the vertex without and with intravenous contrast. RADIATION DOSE REDUCTION: This exam was performed according to the departmental  dose-optimization program which includes automated exposure control, adjustment of the mA and/or kV according to patient size and/or use of iterative reconstruction technique. CONTRAST:  75mL OMNIPAQUE IOHEXOL 300 MG/ML  SOLN COMPARISON:  Neck CT the same day reported separately. Brain MRI 12/02/2008. Head CT 04/17/2009. FINDINGS: Brain: Cerebral volume loss since 2010 appears to be generalized. No midline shift, ventriculomegaly, mass effect, evidence of mass lesion, intracranial hemorrhage or evidence of cortically based acute infarction. Patchy and confluent bilateral cerebral white matter hypodensity is moderate to advanced for age. No cortical encephalomalacia is identified. Heterogeneity in the bilateral basal ganglia is largely stable since 2010. Subtle right posterior parasagittal dural thickening and enhancement measuring only 2-3 mm on series 8, image 55. Tiny meningioma is possible but in this age group significance is doubtful. No other abnormal enhancement identified. Vascular: Advanced Calcified atherosclerosis at the skull base. Major intracranial vascular structures are enhancing as expected. Skull: Chronic left frontotemporal craniotomy is stable since 2010. No acute osseous abnormality identified. Sinuses/Orbits: Visualized paranasal sinuses and mastoids are stable and well aerated. Other: Left parotidectomy, see neck CT the same day reported separately. Postoperative changes to both globes since 2010. Chronic left scalp convexity postoperative changes. IMPRESSION: 1. No metastatic disease or acute intracranial abnormality identified. 2. 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 3. Chronic left side craniotomy. 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 4. Previous Left parotidectomy, see Neck CT the same day reported separately. Electronically Signed   By: Odessa Fleming M.D.   On: 09/12/2023 11:12   CT Soft Tissue  Neck W Contrast Result Date: 09/12/2023 CLINICAL DATA:  83 year old male with skin cancer, 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 Restaging. EXAM: CT NECK WITH CONTRAST TECHNIQUE: Multidetector CT imaging of the neck was performed using the standard protocol following the bolus administration of intravenous contrast. RADIATION DOSE REDUCTION: This exam was performed according to the departmental dose-optimization program which includes automated exposure control, adjustment of the mA and/or kV according to patient size and/or use of iterative reconstruction technique. CONTRAST:  75mL OMNIPAQUE IOHEXOL 300 MG/ML  SOLN COMPARISON:  Chest and Head CT the same day are reported separately. Previous neck CT 01/14/2023 and earlier. FINDINGS: Pharynx and larynx: Stable, capacious larynx and pharynx. Soft tissue contours are stable and within normal limits. Negative parapharyngeal and retropharyngeal spaces. Salivary glands: Sublingual space, submandibular glands appear stable and negative. Negative right parotid gland.  Stable appearance of left parotidectomy, surgical clips along the posterior mandible ramus. No abnormal enhancement or soft tissue identified in the region. Thyroid: Stable and negative for age. Lymph nodes: Stable, diminutive.  No cervical lymphadenopathy. Vascular: Calcified atherosclerosis bilaterally. Major vascular structures in the neck and at the skull base remain patent. Limited intracranial: Dedicated head CT is reported separately. Partially visible left temporal craniotomy. Calcified atherosclerosis at the skull base. Visualized orbits: Stable and negative. Mastoids and visualized paranasal sinuses: Stable and negative. Skeleton: Mildly heterogeneous bone mineralization appears stable since January of 2023. No acute or suspicious osseous abnormality identified in the neck. Upper chest: Dedicated chest CT reported separately. IMPRESSION: 1.  Stable and satisfactory post treatment CT appearance of the Neck. 2. Chest CT, Head CT the same day are reported separately. Electronically Signed   By: Odessa Fleming M.D.   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 * Tracking Code: BO * EXAM: CT CHEST WITH CONTRAST TECHNIQUE: Multidetector CT imaging of the chest was performed during intravenous contrast administration. RADIATION DOSE REDUCTION: This exam was performed according to the departmental dose-optimization program which includes automated exposure control, adjustment of the mA and/or kV according to patient size and/or use of iterative reconstruction technique. CONTRAST:  75mL OMNIPAQUE IOHEXOL 300 MG/ML  SOLN COMPARISON:  04/24/2023. FINDINGS: Cardiovascular: Atherosclerotic calcification of the aorta, aortic valve and coronary arteries. Heart is at the upper limits of normal in size. Left ventricle appears dilated. No pericardial effusion. Enlarged right and left pulmonary arteries. Mediastinum/Nodes: Necrotic left hilar adenopathy measures 1.9 x 3.5 cm. Mediastinal lymph nodes are not enlarged by CT size criteria. No axillary adenopathy. Esophagus is grossly unremarkable. Lungs/Pleura: Mild bibasilar subsegmental volume loss. Calcified granulomas. 5 mm lingular nodule (5/79), stable. Narrowing of the left upper lobe bronchus with obstruction of the lingular bronchus and associated mucoid impaction and peribronchovascular nodularity in the lingula. No pleural fluid. Airway is otherwise unremarkable. Upper Abdomen: Blush of hyperattenuation in the dome of the right hepatic lobe, possibly a perfusion anomaly. Low-attenuation lesions in the right kidney. No specific follow-up necessary. Visualized portions of the liver, gallbladder, adrenal glands, kidneys, spleen, pancreas, stomach and bowel are otherwise grossly unremarkable. No upper abdominal adenopathy.  Musculoskeletal: Degenerative changes in the spine. IMPRESSION: 1. Metastatic left hilar adenopathy with obstruction of the lingular bronchus and associated mucoid impaction and peribronchovascular nodularity in the lingula. No additional evidence of metastatic disease. 2. Aortic atherosclerosis (ICD10-I70.0). Coronary artery calcification. 3. Enlarged right and left pulmonary arteries, indicative of pulmonary arterial hypertension. Electronically Signed   By: Leanna Battles M.D.   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  He was initially diagnosed in April 2022 with involvement of the skin around the left parotid gland status post left parotidectomy with skin resection followed by adjuvant radiation.  He then had evidence of disease progression with metastases to the lung in February 2023.   The patient completed 35 cycles (2 years) of immunotherapy with  Libtayo (Cempilimab) 350 Mg IV every 3 weeks status post 35 cycles.   The patient had a restaging CT scan performed recently that showed  a larger, necrotic lymph node in the left hilar area, suggesting possible disease activity.   Dr. Arbutus Ped recommended a PET scan to further evaluate this.  The patient was seen with Dr. Arbutus Ped today.  Dr. Arbutus Ped personally and independently reviewed the scan and discussed results with the patient today.  The scan showed just the hypermetabolic hilar lymph node.  Dr. Arbutus Ped recommends a biopsy to confirm.   Therefore, I placed a referral to pulmonary medicine per Dr. Arbutus Ped for consideration of bronchoscopy and biopsy.   We will tentatively plan on seeing the patient back for follow-up and to review the biopsy in 3 weeks.  If malignant, Dr. Arbutus Ped will consider the patient for radiation versus chemo RT.  He will continue to follow with dermatology.     The patient was  advised to call immediately if he has any concerning symptoms in the interval. The patient voices understanding of current disease status and treatment options and is in agreement with the current care plan. All questions were answered. The patient knows to call the clinic with any problems, questions or concerns. We can certainly see the patient much sooner if necessary  No orders of the defined types were placed in this encounter.    Maryanne Huneycutt L Lincoln Kleiner, PA-C 09/22/23  ADDENDUM: Hematology/Oncology Attending:  I had a face-to-face encounter with the patient today.  I reviewed his record, lab, scan and recommended his care plan.  This is a very pleasant 83 years old white male with a stage IV squamous cell carcinoma of the skin with pulmonary involvement diagnosed in 2023 and initially presented with skin cancer around the parotid gland in April 2022.  He is status post palliative radiotherapy to that area followed by 2 years treatment with immunotherapy with Libtayo (Cempilimab) completed in February 2025.  The patient had repeat CT scan of the head, neck, chest on September 03, 2023 that showed suspicious soft tissue/left hilar adenopathy concerning for disease recurrence in that area.  I did order a PET scan which was performed on 09/19/2023 and the patient is here today for evaluation accompanied by his wife and his son Onalee Hua was available by phone during the visit. I personally and independently reviewed the PET scan images and discussed the result and showed the images to the patient and his wife today.  His PET scan showed the left hilar mass/adenopathy described on the previous CT scan is hypermetabolic consistent with metastasis or less likely new primary but no other hypermetabolic metastatic disease. I recommended for the patient to see pulmonary medicine for consideration of bronchoscopy and biopsy of the left hilar mass/adenopathy for confirmation of the diagnosis. I will see him back  for follow-up visit in 3 weeks for evaluation and discussion of his treatment options based on the final pathology. The patient was advised to call immediately if he has any other concerning symptoms in the interval. The total time spent in the appointment was 30 minutes. Disclaimer: This note was dictated with voice recognition software. Similar sounding words can inadvertently be transcribed and may be missed upon review. Lajuana Matte, MD

## 2023-09-23 ENCOUNTER — Other Ambulatory Visit: Payer: Self-pay

## 2023-09-24 DIAGNOSIS — D2262 Melanocytic nevi of left upper limb, including shoulder: Secondary | ICD-10-CM | POA: Diagnosis not present

## 2023-09-24 DIAGNOSIS — D2261 Melanocytic nevi of right upper limb, including shoulder: Secondary | ICD-10-CM | POA: Diagnosis not present

## 2023-09-24 DIAGNOSIS — Z85828 Personal history of other malignant neoplasm of skin: Secondary | ICD-10-CM | POA: Diagnosis not present

## 2023-09-24 DIAGNOSIS — D485 Neoplasm of uncertain behavior of skin: Secondary | ICD-10-CM | POA: Diagnosis not present

## 2023-09-24 DIAGNOSIS — D045 Carcinoma in situ of skin of trunk: Secondary | ICD-10-CM | POA: Diagnosis not present

## 2023-09-24 DIAGNOSIS — L821 Other seborrheic keratosis: Secondary | ICD-10-CM | POA: Diagnosis not present

## 2023-09-24 DIAGNOSIS — D225 Melanocytic nevi of trunk: Secondary | ICD-10-CM | POA: Diagnosis not present

## 2023-09-24 DIAGNOSIS — L570 Actinic keratosis: Secondary | ICD-10-CM | POA: Diagnosis not present

## 2023-09-25 ENCOUNTER — Inpatient Hospital Stay: Attending: Internal Medicine | Admitting: Physician Assistant

## 2023-09-25 ENCOUNTER — Inpatient Hospital Stay

## 2023-09-25 VITALS — BP 146/83 | HR 80 | Temp 97.6°F | Resp 16 | Wt 134.6 lb

## 2023-09-25 DIAGNOSIS — R590 Localized enlarged lymph nodes: Secondary | ICD-10-CM | POA: Diagnosis not present

## 2023-09-25 DIAGNOSIS — Z08 Encounter for follow-up examination after completed treatment for malignant neoplasm: Secondary | ICD-10-CM | POA: Insufficient documentation

## 2023-09-25 DIAGNOSIS — C7989 Secondary malignant neoplasm of other specified sites: Secondary | ICD-10-CM | POA: Diagnosis not present

## 2023-09-25 DIAGNOSIS — Z85828 Personal history of other malignant neoplasm of skin: Secondary | ICD-10-CM | POA: Insufficient documentation

## 2023-09-25 LAB — COMPREHENSIVE METABOLIC PANEL WITH GFR
ALT: 16 U/L (ref 0–44)
AST: 16 U/L (ref 15–41)
Albumin: 3.9 g/dL (ref 3.5–5.0)
Alkaline Phosphatase: 114 U/L (ref 38–126)
Anion gap: 5 (ref 5–15)
BUN: 15 mg/dL (ref 8–23)
CO2: 29 mmol/L (ref 22–32)
Calcium: 9.3 mg/dL (ref 8.9–10.3)
Chloride: 107 mmol/L (ref 98–111)
Creatinine, Ser: 0.88 mg/dL (ref 0.61–1.24)
GFR, Estimated: >60 mL/min (ref >60)
Glucose, Bld: 132 mg/dL — ABNORMAL HIGH (ref 70–99)
Potassium: 3.7 mmol/L (ref 3.5–5.1)
Sodium: 141 mmol/L (ref 135–145)
Total Bilirubin: 0.5 mg/dL (ref 0.0–1.2)
Total Protein: 6.7 g/dL (ref 6.5–8.1)

## 2023-09-25 LAB — CBC WITH DIFFERENTIAL/PLATELET
Abs Immature Granulocytes: 0.02 10*3/uL (ref 0.00–0.07)
Basophils Absolute: 0 10*3/uL (ref 0.0–0.1)
Basophils Relative: 1 %
Eosinophils Absolute: 0.2 10*3/uL (ref 0.0–0.5)
Eosinophils Relative: 2 %
HCT: 37.4 % — ABNORMAL LOW (ref 39.0–52.0)
Hemoglobin: 12.7 g/dL — ABNORMAL LOW (ref 13.0–17.0)
Immature Granulocytes: 0 %
Lymphocytes Relative: 14 %
Lymphs Abs: 1.2 10*3/uL (ref 0.7–4.0)
MCH: 29.1 pg (ref 26.0–34.0)
MCHC: 34 g/dL (ref 30.0–36.0)
MCV: 85.8 fL (ref 80.0–100.0)
Monocytes Absolute: 0.5 10*3/uL (ref 0.1–1.0)
Monocytes Relative: 6 %
Neutro Abs: 6.7 10*3/uL (ref 1.7–7.7)
Neutrophils Relative %: 77 %
Platelets: 227 10*3/uL (ref 150–400)
RBC: 4.36 MIL/uL (ref 4.22–5.81)
RDW: 14.2 % (ref 11.5–15.5)
WBC: 8.6 10*3/uL (ref 4.0–10.5)
nRBC: 0 % (ref 0.0–0.2)

## 2023-09-25 LAB — TSH: TSH: 1.452 u[IU]/mL (ref 0.350–4.500)

## 2023-09-26 ENCOUNTER — Other Ambulatory Visit: Payer: Self-pay

## 2023-09-26 LAB — T4: T4, Total: 5.9 ug/dL (ref 4.5–12.0)

## 2023-09-30 ENCOUNTER — Telehealth: Payer: Self-pay | Admitting: Medical Oncology

## 2023-09-30 ENCOUNTER — Telehealth: Payer: Self-pay

## 2023-09-30 NOTE — Telephone Encounter (Signed)
 He has not heard from Dr Lacinda Pica office about referral.Alicia will f/u with clinical staff to get an appt and call pt.

## 2023-09-30 NOTE — Telephone Encounter (Signed)
 Patient called and stated that he has an appt with Dr. Lacinda Pica office 11/27/2023 @ 9.  Thanked patient for letting our office know.

## 2023-10-01 ENCOUNTER — Other Ambulatory Visit: Payer: Self-pay

## 2023-10-01 NOTE — Telephone Encounter (Signed)
 Copied from CRM [id number]. Topic: Referral - Question >> Sep 30, 2023 10:02 AM Margarette Shawl wrote: Reason for CRM:   Diane, nurse from Dr. Bing Buff office contacted clinic to schedule appt for initial consultation with Dr. Baldwin Levee. Unable to find avail appt prior to 11/2023. Offered sooner appts at [facility]; however, nurse asked for us  to reach out to patient to see if he is willing to go to [place] instead of [place].   Attempted to contact patient, no answer and no voicemail set up. Of note, patient is being seen for PET scan showing + for left hilar mass and has history of skin cancer. He would like to be seen as soon as possible.   CB# [phone] >> Sep 30, 2023  1:58 PM Hilton Lucky wrote: Patient calling once again to complain that he has not heard back. Verified both numbers on file are correct. Informed answering machine is not set up so we could not leave a voicemail.  >> Sep 30, 2023 11:57 AM Justina Oman C wrote: Patient [phone] missed a call from the office, unsure whom called. Patient called earlier today trying to schedule an appointment with Dr. Baldwin Levee, the nurse was going to look into this matter. Tried to contact [facility], wait too long, patient asked to have a call back, please advise.   Patient has a office visit with sarah on 10/08/2023 as a new patient pul at 9:00am nothing else further needed

## 2023-10-06 ENCOUNTER — Telehealth: Payer: Self-pay

## 2023-10-06 NOTE — Telephone Encounter (Signed)
 Patient called and LVM stating he got a text message about previous appts. Spoke with patients wife and confirmed upcoming appts with her.  She verbalized understanding.

## 2023-10-08 ENCOUNTER — Ambulatory Visit: Admitting: Acute Care

## 2023-10-08 ENCOUNTER — Encounter: Payer: Self-pay | Admitting: Emergency Medicine

## 2023-10-08 ENCOUNTER — Encounter: Payer: Self-pay | Admitting: Acute Care

## 2023-10-08 VITALS — BP 158/84 | HR 61 | Ht 70.0 in | Wt 131.8 lb

## 2023-10-08 DIAGNOSIS — C4492 Squamous cell carcinoma of skin, unspecified: Secondary | ICD-10-CM | POA: Diagnosis not present

## 2023-10-08 DIAGNOSIS — R599 Enlarged lymph nodes, unspecified: Secondary | ICD-10-CM | POA: Diagnosis not present

## 2023-10-08 DIAGNOSIS — Z9889 Other specified postprocedural states: Secondary | ICD-10-CM

## 2023-10-08 NOTE — H&P (View-Only) (Signed)
 History of Present Illness Shaun Mcmillan is a 83 y.o. male never smoker with PMH of squamous cell carcinoma of the skin referred to Dr. Baldwin Levee for consideration of navigational bronchoscopy with biopsies of a left hilar lymph node which was hypermetabolic on PET.  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 He initially presented with skin cancer around the parotid gland in April 2022.  1) status post left parotidectomy with skin resection and rotational flap completed by Dr. Donalee Fruits in April 2022. Final pathology showed squamous cell carcinoma.  2) status post radiation therapy under the care of Dr. Lurena Sally completing 33 fractions on December 19, 2020 for a total of 66 Gray  3) Libtayo  (Cempilimab) 350 Mg IV every 3 weeks started August 10, 2021.  Status post 35  cycles of treatment.     Pt. Has consented to use of [employer] soft wear to help capture the content of this OV .  10/08/2023 Shaun Buggy "Ron" is an 83 year old male with squamous cell carcinoma of the skin who presents for evaluation of a left hilar lymph node following a PET scan, concerning for pulmonary metastasis vs new primary lung cancer. He was referred by Dr. Marguerita Shih for evaluation of his finding.  He has a history of squamous cell carcinoma of the skin and recently completed 35 cycles of chemotherapy. A PET scan performed post-chemotherapy revealed a left hilar lymph node measuring 3.3 by 2.1 centimeters. He is concerned about whether this lymph node represents a spread of his known skin cancer or a new process.  He has experienced significant weight loss, dropping from 170 pounds to 121 pounds since starting chemotherapy. Despite this, he eats well. No history of smoking, sleep apnea, or coughing up blood. He has not experienced any adverse reactions to general anesthesia in the past.  We reviewed the results of the PET scan and other imaging, and discussed  that the best option moving forward is for a biopsy of the left hilar lymph node to determine if this is a cancer, and if it is a cancer, is it metastatic disease vs a new lung primary. We have discussed the risks and benefits of the procedure. Patient is in agreement with proceeding with the bronchoscopy and biopsy.    He takes metformin  daily for diabetes and an 81 mg aspirin , which he will need to hold before an upcoming procedure. He has no known allergies to latex.   Test Results: PET Scan 09/19/2022 Hypermetabolism corresponding to the left hilar mass on recent diagnostic CT. On the order of 3.3 x 2.1 cm and a S.U.V. max of 20.8 on 84/4. Left hilar mass/adenopathy described on recent diagnostic chest CT is hypermetabolic, consistent with metastasis or less likely new primary. No other hypermetabolic metastatic disease.  CT Chest 09/03/2023 Mediastinum/Nodes: Necrotic left hilar adenopathy measures 1.9 x 3.5 cm. Mediastinal lymph nodes are not enlarged by CT size criteria. No axillary adenopathy. Esophagus is grossly unremarkable.   Lungs/Pleura: Mild bibasilar subsegmental volume loss. Calcified granulomas. 5 mm lingular nodule (5/79), stable. Narrowing of the left upper lobe bronchus with obstruction of the lingular bronchus and associated mucoid impaction and peribronchovascular nodularity in the lingula. No pleural fluid. Airway is otherwise unremarkable.  Metastatic left hilar adenopathy with obstruction of the lingular bronchus and associated mucoid impaction and peribronchovascular nodularity in the lingula. No additional evidence of metastatic disease. 2. Aortic atherosclerosis (ICD10-I70.0). Coronary artery calcification. 3. Enlarged right and  left pulmonary arteries, indicative of pulmonary arterial hypertension.         Latest Ref Rng & Units 09/25/2023    1:12 PM 09/03/2023    1:02 PM 08/14/2023   10:29 AM  CBC  WBC 4.0 - 10.5 K/uL 8.6  10.7  6.1   Hemoglobin  13.0 - 17.0 g/dL 78.2  95.6  21.3   Hematocrit 39.0 - 52.0 % 37.4  40.2  37.8   Platelets 150 - 400 K/uL 227  234  206        Latest Ref Rng & Units 09/25/2023    1:12 PM 09/03/2023    1:02 PM 08/14/2023   10:29 AM  BMP  Glucose 70 - 99 mg/dL 086  578  469   BUN 8 - 23 mg/dL 15  16  25    Creatinine 0.61 - 1.24 mg/dL 6.29  5.28  4.13   Sodium 135 - 145 mmol/L 141  139  139   Potassium 3.5 - 5.1 mmol/L 3.7  4.0  4.2   Chloride 98 - 111 mmol/L 107  103  105   CO2 22 - 32 mmol/L 29  30  30    Calcium  8.9 - 10.3 mg/dL 9.3  9.9  9.5     BNP No results found for: "BNP"  ProBNP No results found for: "PROBNP"  PFT No results found for: "FEV1PRE", "FEV1POST", "FVCPRE", "FVCPOST", "TLC", "DLCOUNC", "PREFEV1FVCRT", "PSTFEV1FVCRT"  NM PET Image Restage (PS) Skull Base to Thigh (F-18 FDG) Result Date: 09/25/2023 CLINICAL DATA:  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 On immunotherapy. EXAM: NUCLEAR MEDICINE PET SKULL BASE TO THIGH TECHNIQUE: 6.0 mCi F-18 FDG was injected intravenously. Full-ring PET imaging was performed from the skull base to thigh after the radiotracer. CT data was obtained and used for attenuation correction and anatomic localization. Fasting blood glucose: 116 mg/dl COMPARISON:  Chest CT 24/40/1027. Neck CT 09/03/2023. Most recent PET of 10/06/2020 FINDINGS: Mediastinal blood pool activity: SUV max 2.3 Liver activity: SUV max NA NECK: Left parotid resection without hypermetabolic residual or recurrent disease. No cervical nodal hypermetabolism. Incidental CT findings: Bilateral carotid atherosclerosis. No cervical adenopathy. Left frontal incompletely imaged craniotomy. CHEST: Hypermetabolism corresponding to the left hilar mass on recent diagnostic CT. On the order of 3.3 x 2.1 cm and a S.U.V. max of 20.8 on 84/4. Incidental CT findings: Deferred to recent diagnostic CT. Aortic and coronary artery calcification. Lingular mucoid  impaction and peribronchovascular nodularity are relatively similar. Bibasilar scarring. ABDOMEN/PELVIS: No abdominopelvic parenchymal or nodal hypermetabolism. Incidental CT findings: Normal adrenal glands. Upper pole right renal 5.3 cm fluid density lesion is most likely a cyst . In the absence of clinically indicated signs/symptoms require(s) no independent follow-up. Abdominal aortic atherosclerosis. SKELETON: No evidence of hypermetabolic osseous metastasis. Right-sided facet hypermetabolism at approximately the C5-6 level is favored to be degenerative. Incidental CT findings: None. IMPRESSION: 1. The left hilar mass/adenopathy described on recent diagnostic chest CT is hypermetabolic, consistent with metastasis or less likely new primary. 2. No other hypermetabolic metastatic disease. 3. Incidental findings, including: Coronary artery atherosclerosis. Aortic Atherosclerosis (ICD10-I70.0). Electronically Signed   By: Lore Rode M.D.   On: 09/25/2023 14:07     Past medical hx Past Medical History:  Diagnosis Date   Arthritis    Diabetes mellitus type II    Glaucoma    sees Dr. 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 He has never been exposed to tobacco smoke. He has never used smokeless tobacco. He reports current alcohol use of about 1.0 standard drink of alcohol per week.  Tobacco Cessation: Counseling given: Not Answered Never smoker   Past surgical hx,  Family hx, Social hx all reviewed.  Current Outpatient Medications on File Prior to Visit  Medication Sig   acetaminophen  (TYLENOL ) 500 MG tablet Take 1,000 mg by mouth every 8 (eight) hours as needed for moderate pain (pain score 4-6).   amLODipine  (NORVASC ) 10 MG tablet TAKE 1 TABLET BY MOUTH DAILY   aspirin  EC 81 MG tablet Take 1 tablet (81 mg total) by mouth daily. Swallow whole.   atorvastatin  (LIPITOR) 40 MG tablet TAKE 1 TABLET BY MOUTH DAILY   Cholecalciferol  (VITAMIN D  PO) Take 5,000 Units by mouth daily.   gabapentin  (NEURONTIN ) 300 MG capsule TAKE 2 CAPSULES BY MOUTH 3 TIMES A DAY   latanoprost  (XALATAN ) 0.005 % ophthalmic solution Place 1 drop into both eyes every morning. [facility]  [address]   LORazepam  (ATIVAN ) 1 MG tablet Take 1 tablet (1 mg total) by mouth 3 (three) times daily as needed for anxiety.   losartan  (COZAAR ) 25 MG tablet TAKE 1 TABLET BY MOUTH DAILY   Melatonin 5 MG TABS Take 5 mg by mouth at bedtime. [facility] [address]   metFORMIN  (GLUCOPHAGE ) 500 MG tablet TAKE 1 TABLET BY MOUTH TWICE  DAILY WITH A MEAL   Multiple Vitamin (MULTIVITAMIN) tablet Take 1 tablet by mouth daily.   prochlorperazine  (COMPAZINE ) 10 MG tablet Take 1 tablet (10 mg total) by mouth every 6 (six) hours as needed for nausea or vomiting.   timolol  (BETIMOL ) 0.5 % ophthalmic solution Place 1 drop into both eyes daily. [facility] [address]   No current facility-administered medications on file prior to visit.     No Known Allergies  Review Of Systems:  Constitutional:   +  weight loss, no night sweats,  Fevers, chills, +fatigue, or  lassitude.  HEENT:   No headaches,  Difficulty swallowing,  Tooth/dental problems, or  Sore throat,                No sneezing, itching, ear ache, nasal congestion, post nasal drip,   CV:  No chest pain,  Orthopnea, PND, swelling in lower extremities, anasarca, dizziness, palpitations, syncope.   GI  No heartburn, indigestion, abdominal pain, nausea, vomiting, diarrhea,  change in bowel habits, loss of appetite, bloody stools.   Resp: No shortness of breath with exertion or at rest.  No excess mucus, no productive cough,  No non-productive cough,  No coughing up of blood.  No change in color of mucus.  No wheezing.  No chest wall deformity  Skin: no rash or lesions.  GU: no dysuria, change in color of urine, no urgency or frequency.  No flank pain, no hematuria  MS:  No joint pain or swelling.  No decreased range of motion.  No back pain.  Psych:  No change in mood or affect. No depression or anxiety.  No memory loss.   Vital Signs BP (!) 158/84 (BP Location: Left Arm, Patient Position: Sitting, Cuff Size: Normal)   Pulse 61   Ht 5\' 10"  (1.778 m)   Wt 131 lb 12.8 oz (59.8 kg)   SpO2 98%   BMI 18.91 kg/m    Physical Exam:  General- No distress,  A&Ox3, pleasant  ENT: No sinus tenderness, TM clear, pale nasal mucosa, no oral exudate,no post nasal drip, no LAN Cardiac: S1, S2, regular rate and rhythm, no murmur Chest: No wheeze/ rales/ dullness; no accessory muscle use, no nasal flaring, no sternal retractions Abd.: Soft Non-tender, ND, BS +, Body mass index is 18.91 kg/m.  Ext: No clubbing cyanosis, edema Neuro:  normal strength, MAE x 4, A&O x 3, appropriate Skin: No rashes, warm and dry, scar to left neck Psych: normal mood and behavior, appropriate   Assessment/Plan Squamous cell carcinoma of skin Squamous cell carcinoma with left hilar lymph node enlargement on PET scan.  Differential includes metastasis from skin cancer or separate primary lung process.  Biopsy needed for diagnosis and treatment guidance. Informed consent for bronchoscopy with biopsies discussed. Plan I have placed an order for a bronchoscopy with biopsies.  We have discussed the procedure in detail.  We have reviewed the risks and benefits of the procedure. These include bleeding, infection, puncture of the lung, and adverse reaction to anesthesia. You have agreed  to proceed with biopsy to evaluate  the left hilar lymph node. Please hold your 81 mg aspirin  the day before the procedure, and the day of the procedure Your procedure will be done by Dr. Racheal Buddle. You will receive a letter today with date time and information pertaining to the procedure. You will need someone to drive you to the procedure, stay with you during the procedure, and stay with you after the procedure. You will also need someone to stay with you for 24 hours after anesthesia to ensure you have cleared and are doing well. You will follow-up with me 1 week after the procedure to review the results and to ensure you are doing well. Call if you need us  prior to the procedure or if you have any questions at all. We are going to take great care of you.  Please contact office for sooner follow up if symptoms do not improve or worsen or seek emergency care      I spent 35 minutes dedicated to the care of this patient on the date of this encounter to include pre-visit review of records, face-to-face time with the patient discussing conditions above, post visit ordering of testing, clinical documentation with the electronic health record, making appropriate referrals as documented, and communicating necessary information to the patient's healthcare team.   Raejean Bullock, NP 10/08/2023  9:30 AM

## 2023-10-08 NOTE — Progress Notes (Signed)
 History of Present Illness Kindle Carver is a 83 y.o. male never smoker with PMH of squamous cell carcinoma of the skin referred to Dr. Baldwin Levee for consideration of navigational bronchoscopy with biopsies of a left hilar lymph node which was hypermetabolic on PET.  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 He initially presented with skin cancer around the parotid gland in April 2022.  1) status post left parotidectomy with skin resection and rotational flap completed by Dr. Donalee Fruits in April 2022. Final pathology showed squamous cell carcinoma.  2) status post radiation therapy under the care of Dr. Lurena Sally completing 33 fractions on December 19, 2020 for a total of 66 Gray  3) Libtayo  (Cempilimab) 350 Mg IV every 3 weeks started August 10, 2021.  Status post 35  cycles of treatment.     Pt. Has consented to use of [employer] soft wear to help capture the content of this OV .  10/08/2023 [name] "Ron" is an 83 year old male with squamous cell carcinoma of the skin who presents for evaluation of a left hilar lymph node following a PET scan, concerning for pulmonary metastasis vs new primary lung cancer. He was referred by Dr. Marguerita Shih for evaluation of his finding.  He has a history of squamous cell carcinoma of the skin and recently completed 35 cycles of chemotherapy. A PET scan performed post-chemotherapy revealed a left hilar lymph node measuring 3.3 by 2.1 centimeters. He is concerned about whether this lymph node represents a spread of his known skin cancer or a new process.  He has experienced significant weight loss, dropping from 170 pounds to 121 pounds since starting chemotherapy. Despite this, he eats well. No history of smoking, sleep apnea, or coughing up blood. He has not experienced any adverse reactions to general anesthesia in the past.  We reviewed the results of the PET scan and other imaging, and discussed  that the best option moving forward is for a biopsy of the left hilar lymph node to determine if this is a cancer, and if it is a cancer, is it metastatic disease vs a new lung primary. We have discussed the risks and benefits of the procedure. Patient is in agreement with proceeding with the bronchoscopy and biopsy.    He takes metformin  daily for diabetes and an 81 mg aspirin , which he will need to hold before an upcoming procedure. He has no known allergies to latex.   Test Results: PET Scan 09/19/2022 Hypermetabolism corresponding to the left hilar mass on recent diagnostic CT. On the order of 3.3 x 2.1 cm and a S.U.V. max of 20.8 on 84/4. Left hilar mass/adenopathy described on recent diagnostic chest CT is hypermetabolic, consistent with metastasis or less likely new primary. No other hypermetabolic metastatic disease.  CT Chest 09/03/2023 Mediastinum/Nodes: Necrotic left hilar adenopathy measures 1.9 x 3.5 cm. Mediastinal lymph nodes are not enlarged by CT size criteria. No axillary adenopathy. Esophagus is grossly unremarkable.   Lungs/Pleura: Mild bibasilar subsegmental volume loss. Calcified granulomas. 5 mm lingular nodule (5/79), stable. Narrowing of the left upper lobe bronchus with obstruction of the lingular bronchus and associated mucoid impaction and peribronchovascular nodularity in the lingula. No pleural fluid. Airway is otherwise unremarkable.  Metastatic left hilar adenopathy with obstruction of the lingular bronchus and associated mucoid impaction and peribronchovascular nodularity in the lingula. No additional evidence of metastatic disease. 2. Aortic atherosclerosis (ICD10-I70.0). Coronary artery calcification. 3. Enlarged right and  left pulmonary arteries, indicative of pulmonary arterial hypertension.         Latest Ref Rng & Units 09/25/2023    1:12 PM 09/03/2023    1:02 PM 08/14/2023   10:29 AM  CBC  WBC 4.0 - 10.5 K/uL 8.6  10.7  6.1   Hemoglobin  13.0 - 17.0 g/dL 16.1  09.6  04.5   Hematocrit 39.0 - 52.0 % 37.4  40.2  37.8   Platelets 150 - 400 K/uL 227  234  206        Latest Ref Rng & Units 09/25/2023    1:12 PM 09/03/2023    1:02 PM 08/14/2023   10:29 AM  BMP  Glucose 70 - 99 mg/dL 409  811  914   BUN 8 - 23 mg/dL 15  16  25    Creatinine 0.61 - 1.24 mg/dL 7.82  9.56  2.13   Sodium 135 - 145 mmol/L 141  139  139   Potassium 3.5 - 5.1 mmol/L 3.7  4.0  4.2   Chloride 98 - 111 mmol/L 107  103  105   CO2 22 - 32 mmol/L 29  30  30    Calcium  8.9 - 10.3 mg/dL 9.3  9.9  9.5     BNP No results found for: "BNP"  ProBNP No results found for: "PROBNP"  PFT No results found for: "FEV1PRE", "FEV1POST", "FVCPRE", "FVCPOST", "TLC", "DLCOUNC", "PREFEV1FVCRT", "PSTFEV1FVCRT"  NM PET Image Restage (PS) Skull Base to Thigh (F-18 FDG) Result Date: 09/25/2023 CLINICAL DATA:  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 On immunotherapy. EXAM: NUCLEAR MEDICINE PET SKULL BASE TO THIGH TECHNIQUE: 6.0 mCi F-18 FDG was injected intravenously. Full-ring PET imaging was performed from the skull base to thigh after the radiotracer. CT data was obtained and used for attenuation correction and anatomic localization. Fasting blood glucose: 116 mg/dl COMPARISON:  Chest CT 08/65/7846. Neck CT 09/03/2023. Most recent PET of 10/06/2020 FINDINGS: Mediastinal blood pool activity: SUV max 2.3 Liver activity: SUV max NA NECK: Left parotid resection without hypermetabolic residual or recurrent disease. No cervical nodal hypermetabolism. Incidental CT findings: Bilateral carotid atherosclerosis. No cervical adenopathy. Left frontal incompletely imaged craniotomy. CHEST: Hypermetabolism corresponding to the left hilar mass on recent diagnostic CT. On the order of 3.3 x 2.1 cm and a S.U.V. max of 20.8 on 84/4. Incidental CT findings: Deferred to recent diagnostic CT. Aortic and coronary artery calcification. Lingular mucoid  impaction and peribronchovascular nodularity are relatively similar. Bibasilar scarring. ABDOMEN/PELVIS: No abdominopelvic parenchymal or nodal hypermetabolism. Incidental CT findings: Normal adrenal glands. Upper pole right renal 5.3 cm fluid density lesion is most likely a cyst . In the absence of clinically indicated signs/symptoms require(s) no independent follow-up. Abdominal aortic atherosclerosis. SKELETON: No evidence of hypermetabolic osseous metastasis. Right-sided facet hypermetabolism at approximately the C5-6 level is favored to be degenerative. Incidental CT findings: None. IMPRESSION: 1. The left hilar mass/adenopathy described on recent diagnostic chest CT is hypermetabolic, consistent with metastasis or less likely new primary. 2. No other hypermetabolic metastatic disease. 3. Incidental findings, including: Coronary artery atherosclerosis. Aortic Atherosclerosis (ICD10-I70.0). Electronically Signed   By: Lore Rode M.D.   On: 09/25/2023 14:07     Past medical hx Past Medical History:  Diagnosis Date   Arthritis    Diabetes mellitus type II    Glaucoma    sees Dr. 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 He has never been exposed to tobacco smoke. He has never used smokeless tobacco. He reports current alcohol use of about 1.0 standard drink of alcohol per week.  Tobacco Cessation: Counseling given: Not Answered Never smoker   Past surgical hx,  Family hx, Social hx all reviewed.  Current Outpatient Medications on File Prior to Visit  Medication Sig   acetaminophen  (TYLENOL ) 500 MG tablet Take 1,000 mg by mouth every 8 (eight) hours as needed for moderate pain (pain score 4-6).   amLODipine  (NORVASC ) 10 MG tablet TAKE 1 TABLET BY MOUTH DAILY   aspirin  EC 81 MG tablet Take 1 tablet (81 mg total) by mouth daily. Swallow whole.   atorvastatin  (LIPITOR) 40 MG tablet TAKE 1 TABLET BY MOUTH DAILY   Cholecalciferol  (VITAMIN D  PO) Take 5,000 Units by mouth daily.   gabapentin  (NEURONTIN ) 300 MG capsule TAKE 2 CAPSULES BY MOUTH 3 TIMES A DAY   latanoprost  (XALATAN ) 0.005 % ophthalmic solution Place 1 drop into both eyes every morning. [facility]  [address]   LORazepam  (ATIVAN ) 1 MG tablet Take 1 tablet (1 mg total) by mouth 3 (three) times daily as needed for anxiety.   losartan  (COZAAR ) 25 MG tablet TAKE 1 TABLET BY MOUTH DAILY   Melatonin 5 MG TABS Take 5 mg by mouth at bedtime. [facility] [address]   metFORMIN  (GLUCOPHAGE ) 500 MG tablet TAKE 1 TABLET BY MOUTH TWICE  DAILY WITH A MEAL   Multiple Vitamin (MULTIVITAMIN) tablet Take 1 tablet by mouth daily.   prochlorperazine  (COMPAZINE ) 10 MG tablet Take 1 tablet (10 mg total) by mouth every 6 (six) hours as needed for nausea or vomiting.   timolol  (BETIMOL ) 0.5 % ophthalmic solution Place 1 drop into both eyes daily. [facility] [address]   No current facility-administered medications on file prior to visit.     No Known Allergies  Review Of Systems:  Constitutional:   +  weight loss, no night sweats,  Fevers, chills, +fatigue, or  lassitude.  HEENT:   No headaches,  Difficulty swallowing,  Tooth/dental problems, or  Sore throat,                No sneezing, itching, ear ache, nasal congestion, post nasal drip,   CV:  No chest pain,  Orthopnea, PND, swelling in lower extremities, anasarca, dizziness, palpitations, syncope.   GI  No heartburn, indigestion, abdominal pain, nausea, vomiting, diarrhea,  change in bowel habits, loss of appetite, bloody stools.   Resp: No shortness of breath with exertion or at rest.  No excess mucus, no productive cough,  No non-productive cough,  No coughing up of blood.  No change in color of mucus.  No wheezing.  No chest wall deformity  Skin: no rash or lesions.  GU: no dysuria, change in color of urine, no urgency or frequency.  No flank pain, no hematuria  MS:  No joint pain or swelling.  No decreased range of motion.  No back pain.  Psych:  No change in mood or affect. No depression or anxiety.  No memory loss.   Vital Signs BP (!) 158/84 (BP Location: Left Arm, Patient Position: Sitting, Cuff Size: Normal)   Pulse 61   Ht 5\' 10"  (1.778 m)   Wt 131 lb 12.8 oz (59.8 kg)   SpO2 98%   BMI 18.91 kg/m    Physical Exam:  General- No distress,  A&Ox3, pleasant  ENT: No sinus tenderness, TM clear, pale nasal mucosa, no oral exudate,no post nasal drip, no LAN Cardiac: S1, S2, regular rate and rhythm, no murmur Chest: No wheeze/ rales/ dullness; no accessory muscle use, no nasal flaring, no sternal retractions Abd.: Soft Non-tender, ND, BS +, Body mass index is 18.91 kg/m.  Ext: No clubbing cyanosis, edema Neuro:  normal strength, MAE x 4, A&O x 3, appropriate Skin: No rashes, warm and dry, scar to left neck Psych: normal mood and behavior, appropriate   Assessment/Plan Squamous cell carcinoma of skin Squamous cell carcinoma with left hilar lymph node enlargement on PET scan.  Differential includes metastasis from skin cancer or separate primary lung process.  Biopsy needed for diagnosis and treatment guidance. Informed consent for bronchoscopy with biopsies discussed. Plan I have placed an order for a bronchoscopy with biopsies.  We have discussed the procedure in detail.  We have reviewed the risks and benefits of the procedure. These include bleeding, infection, puncture of the lung, and adverse reaction to anesthesia. You have agreed  to proceed with biopsy to evaluate  the left hilar lymph node. Please hold your 81 mg aspirin  the day before the procedure, and the day of the procedure Your procedure will be done by Dr. Racheal Buddle. You will receive a letter today with date time and information pertaining to the procedure. You will need someone to drive you to the procedure, stay with you during the procedure, and stay with you after the procedure. You will also need someone to stay with you for 24 hours after anesthesia to ensure you have cleared and are doing well. You will follow-up with me 1 week after the procedure to review the results and to ensure you are doing well. Call if you need us  prior to the procedure or if you have any questions at all. We are going to take great care of you.  Please contact office for sooner follow up if symptoms do not improve or worsen or seek emergency care      I spent 35 minutes dedicated to the care of this patient on the date of this encounter to include pre-visit review of records, face-to-face time with the patient discussing conditions above, post visit ordering of testing, clinical documentation with the electronic health record, making appropriate referrals as documented, and communicating necessary information to the patient's healthcare team.   Raejean Bullock, NP 10/08/2023  9:30 AM

## 2023-10-08 NOTE — Progress Notes (Signed)
 History of Present Illness Shaun Mcmillan is a 83 year old  male never smoker with PMH of squamous cell carcinoma of the skin referred to Dr. Baldwin Levee for consideration of navigational bronchoscopy with biopsies of a left hilar lymph node which was hypermetabolic on PET.   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 He initially presented with skin cancer around the parotid gland in April 2022.  1) status post left parotidectomy with skin resection and rotational flap completed by Dr. Donalee Fruits in April 2022. Final pathology showed squamous cell carcinoma.  2) status post radiation therapy under the care of Dr. Lurena Sally completing 33 fractions on December 19, 2020 for a total of 66 Gray  3) Libtayo  (Cempilimab) 350 Mg IV every 3 weeks started August 10, 2021.  Status post 35  cycles of treatment.      Pt. Has consented to use of [employer] soft wear to help capture the content of this OV .     10/08/2023 Shaun Buggy "Ron" is an 83 year old male with squamous cell carcinoma of the skin who presents for evaluation of a left hilar lymph node following a PET scan, concerning for pulmonary metastasis vs new primary lung cancer. He was referred by Dr. Marguerita Shih for evaluation of his finding.   He has a history of squamous cell carcinoma of the skin and recently completed 35 cycles of chemotherapy. A PET scan performed post-chemotherapy revealed a left hilar lymph node measuring 3.3 by 2.1 centimeters. He is concerned about whether this lymph node represents a spread of his known skin cancer or a new process.   He has experienced significant weight loss, dropping from 170 pounds to 121 pounds since starting chemotherapy. Despite this, he eats well. No history of smoking, sleep apnea, or coughing up blood. He has not experienced any adverse reactions to general anesthesia in the past.   We reviewed the results of the PET scan and other imaging,  and discussed that the best option moving forward is for a biopsy of the left hilar lymph node to determine if this is a cancer, and if it is a cancer, is it metastatic disease vs a new lung primary. We have discussed the risks and benefits of the procedure. Patient is in agreement with proceeding with the bronchoscopy and biopsy.     He takes metformin  daily for diabetes and an 81 mg aspirin , which he will need to hold before an upcoming procedure. He has no known allergies to latex.     Test Results: PET Scan 09/19/2022 Hypermetabolism corresponding to the left hilar mass on recent diagnostic CT. On the order of 3.3 x 2.1 cm and a S.U.V. max of 20.8 on 84/4. Left hilar mass/adenopathy described on recent diagnostic chest CT is hypermetabolic, consistent with metastasis or less likely new primary. No other hypermetabolic metastatic disease.   CT Chest 09/03/2023 Mediastinum/Nodes: Necrotic left hilar adenopathy measures 1.9 x 3.5 cm. Mediastinal lymph nodes are not enlarged by CT size criteria. No axillary adenopathy. Esophagus is grossly unremarkable.   Lungs/Pleura: Mild bibasilar subsegmental volume loss. Calcified granulomas. 5 mm lingular nodule (5/79), stable. Narrowing of the left upper lobe bronchus with obstruction of the lingular bronchus and associated mucoid impaction and peribronchovascular nodularity in the lingula. No pleural fluid. Airway is otherwise unremarkable.  Metastatic left hilar adenopathy with obstruction of the lingular bronchus and associated mucoid impaction and peribronchovascular nodularity in the lingula. No additional evidence  of metastatic disease. 2. Aortic atherosclerosis (ICD10-I70.0). Coronary artery calcification. 3. Enlarged right and left pulmonary arteries, indicative of pulmonary arterial hypertension.          Latest Ref Rng & Units 09/25/2023    1:12 PM 09/03/2023    1:02 PM 08/14/2023   10:29 AM  CBC  WBC 4.0 - 10.5 K/uL 8.6  10.7   6.1   Hemoglobin 13.0 - 17.0 g/dL 78.2  95.6  21.3   Hematocrit 39.0 - 52.0 % 37.4  40.2  37.8   Platelets 150 - 400 K/uL 227  234  206        Latest Ref Rng & Units 09/25/2023    1:12 PM 09/03/2023    1:02 PM 08/14/2023   10:29 AM  BMP  Glucose 70 - 99 mg/dL 086  578  469   BUN 8 - 23 mg/dL 15  16  25    Creatinine 0.61 - 1.24 mg/dL 6.29  5.28  4.13   Sodium 135 - 145 mmol/L 141  139  139   Potassium 3.5 - 5.1 mmol/L 3.7  4.0  4.2   Chloride 98 - 111 mmol/L 107  103  105   CO2 22 - 32 mmol/L 29  30  30    Calcium  8.9 - 10.3 mg/dL 9.3  9.9  9.5     BNP No results found for: "BNP"  ProBNP No results found for: "PROBNP"  PFT No results found for: "FEV1PRE", "FEV1POST", "FVCPRE", "FVCPOST", "TLC", "DLCOUNC", "PREFEV1FVCRT", "PSTFEV1FVCRT"  NM PET Image Restage (PS) Skull Base to Thigh (F-18 FDG) Result Date: 09/25/2023 CLINICAL DATA:  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 On immunotherapy. EXAM: NUCLEAR MEDICINE PET SKULL BASE TO THIGH TECHNIQUE: 6.0 mCi F-18 FDG was injected intravenously. Full-ring PET imaging was performed from the skull base to thigh after the radiotracer. CT data was obtained and used for attenuation correction and anatomic localization. Fasting blood glucose: 116 mg/dl COMPARISON:  Chest CT 24/40/1027. Neck CT 09/03/2023. Most recent PET of 10/06/2020 FINDINGS: Mediastinal blood pool activity: SUV max 2.3 Liver activity: SUV max NA NECK: Left parotid resection without hypermetabolic residual or recurrent disease. No cervical nodal hypermetabolism. Incidental CT findings: Bilateral carotid atherosclerosis. No cervical adenopathy. Left frontal incompletely imaged craniotomy. CHEST: Hypermetabolism corresponding to the left hilar mass on recent diagnostic CT. On the order of 3.3 x 2.1 cm and a S.U.V. max of 20.8 on 84/4. Incidental CT findings: Deferred to recent diagnostic CT. Aortic and coronary artery calcification.  Lingular mucoid impaction and peribronchovascular nodularity are relatively similar. Bibasilar scarring. ABDOMEN/PELVIS: No abdominopelvic parenchymal or nodal hypermetabolism. Incidental CT findings: Normal adrenal glands. Upper pole right renal 5.3 cm fluid density lesion is most likely a cyst . In the absence of clinically indicated signs/symptoms require(s) no independent follow-up. Abdominal aortic atherosclerosis. SKELETON: No evidence of hypermetabolic osseous metastasis. Right-sided facet hypermetabolism at approximately the C5-6 level is favored to be degenerative. Incidental CT findings: None. IMPRESSION: 1. The left hilar mass/adenopathy described on recent diagnostic chest CT is hypermetabolic, consistent with metastasis or less likely new primary. 2. No other hypermetabolic metastatic disease. 3. Incidental findings, including: Coronary artery atherosclerosis. Aortic Atherosclerosis (ICD10-I70.0). Electronically Signed   By: Lore Rode M.D.   On: 09/25/2023 14:07     Past medical hx Past Medical History:  Diagnosis Date   Arthritis    Diabetes mellitus type II    Glaucoma    sees Dr. 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 He has never been exposed to tobacco smoke. He has never used smokeless tobacco. He reports current alcohol use of about 1.0 standard drink of alcohol per week.  Tobacco Cessation: Counseling given: Not Answered Never smoker    Past surgical hx, Family hx, Social hx all reviewed.  Current Outpatient Medications on File Prior to Visit  Medication Sig   acetaminophen  (TYLENOL ) 500 MG tablet Take 1,000 mg by mouth every 8 (eight) hours as needed for moderate pain (pain score 4-6).   amLODipine  (NORVASC ) 10 MG tablet TAKE 1 TABLET BY MOUTH DAILY   aspirin  EC 81 MG tablet Take 1 tablet (81 mg total) by mouth daily. Swallow whole.   atorvastatin  (LIPITOR) 40 MG tablet TAKE 1 TABLET BY MOUTH DAILY   Cholecalciferol  (VITAMIN D  PO) Take 5,000 Units by mouth daily.   gabapentin  (NEURONTIN ) 300 MG capsule TAKE 2 CAPSULES BY MOUTH 3 TIMES A DAY   latanoprost  (XALATAN ) 0.005 % ophthalmic solution Place 1 drop into both eyes every morning. [facility]  [address]   LORazepam  (ATIVAN ) 1 MG tablet Take 1 tablet (1 mg total) by mouth 3 (three) times daily as needed for anxiety.   losartan  (COZAAR ) 25 MG tablet TAKE 1 TABLET BY MOUTH DAILY   Melatonin 5 MG TABS Take 5 mg by mouth at bedtime. [facility] [address]   metFORMIN  (GLUCOPHAGE ) 500 MG tablet TAKE 1 TABLET BY MOUTH TWICE  DAILY WITH A MEAL   Multiple Vitamin (MULTIVITAMIN) tablet Take 1 tablet by mouth daily.   prochlorperazine  (COMPAZINE ) 10 MG tablet Take 1 tablet (10 mg total) by mouth every 6 (six) hours as needed for nausea or vomiting.   timolol  (BETIMOL ) 0.5 % ophthalmic solution Place 1 drop into both eyes daily. [facility] [address]   No current facility-administered medications on file prior to visit.     No Known Allergies  Review Of Systems:  Constitutional:   +  weight loss, No night sweats,  Fevers, chills, fatigue, or  lassitude.  HEENT:   No headaches,  Difficulty swallowing,  Tooth/dental problems, or  Sore throat,                No sneezing, itching, ear ache, nasal congestion, post nasal drip,   CV:  No chest pain,  Orthopnea, PND, swelling in lower extremities, anasarca, dizziness, palpitations, syncope.   GI  No heartburn, indigestion, abdominal pain, nausea,  vomiting, diarrhea, change in bowel habits, loss of appetite, bloody stools.   Resp: No shortness of breath with exertion or at rest.  No excess mucus, no productive cough,  No non-productive cough,  No coughing up of blood.  No change in color of mucus.  No wheezing.  No chest wall deformity  Skin: no rash or lesions.  GU: no dysuria, change  in color of urine, no urgency or frequency.  No flank pain, no hematuria   MS:  No joint pain or swelling.  No decreased range of motion.  No back pain.  Psych:  No change in mood or affect. No depression or anxiety.  No memory loss.   Vital Signs BP (!) 158/84 (BP Location: Left Arm, Patient Position: Sitting, Cuff Size: Normal)   Pulse 61   Ht 5\' 10"  (1.778 m)   Wt 131 lb 12.8 oz (59.8 kg)   SpO2 98%   BMI 18.91 kg/m    Physical Exam:  General- No distress,  A&Ox3, pleasant ENT: No sinus tenderness, TM clear, pale nasal mucosa, no oral exudate,no post nasal drip, no LAN Cardiac: S1, S2, regular rate and rhythm, no murmur Chest: No wheeze/ rales/ dullness; no accessory muscle use, no nasal flaring, no sternal retractions Abd.: Soft Non-tender, ND, BS +, Body mass index is 18.91 kg/m.  Ext: No clubbing cyanosis, edema Neuro:  normal strength, MAE x 4, A&O x 3, appropriate Skin: No rashes, warm and dry, scar to left neck Psych: normal mood and behavior,appropriate   Assessment/Plan Squamous cell carcinoma of skin Squamous cell carcinoma with left hilar lymph node enlargement on PET scan.  Differential includes metastasis from skin cancer or separate primary lung process.  Biopsy needed for diagnosis and treatment guidance. Informed consent for bronchoscopy with biopsies discussed. Plan I have placed an order for a bronchoscopy with biopsies.  We have discussed the procedure in detail.  We have reviewed the risks and benefits of the procedure. These include bleeding, infection, puncture of the lung, and adverse reaction to  anesthesia. You have agreed to proceed with biopsy to evaluate  the left hilar lymph node. Please hold your 81 mg aspirin  the day before the procedure, and the day of the procedure Your procedure will be done by Dr. Racheal Buddle. You will receive a letter today with date time and information pertaining to the procedure. You will need someone to drive you to the procedure, stay with you during the procedure, and stay with you after the procedure. You will also need someone to stay with you for 24 hours after anesthesia to ensure you have cleared and are doing well. You will follow-up with me 1 week after the procedure to review the results and to ensure you are doing well. Call if you need us  prior to the procedure or if you have any questions at all. We are going to take great care of you.  Please contact office for sooner follow up if symptoms do not improve or worsen or seek emergency care        I spent 35 minutes dedicated to the care of this patient on the date of this encounter to include pre-visit review of records, face-to-face time with the patient discussing conditions above, post visit ordering of testing, clinical documentation with the electronic health record, making appropriate referrals as documented, and communicating necessary information to the patient's healthcare team.        Raejean Bullock, NP 10/08/2023  9:28 AM

## 2023-10-08 NOTE — Patient Instructions (Signed)
 It is good to see you today. I have placed an order for a bronchoscopy with biopsies.  We have discussed the procedure in detail.  We have reviewed the risks and benefits of the procedure. These include bleeding, infection, puncture of the lung, and adverse reaction to anesthesia. You have agreed to proceed with biopsy to evaluate  the left hilar lymph node. Please hold your 81 mg aspirin  the day before the procedure, and the day of the procedure Your procedure will be done by Dr. Racheal Buddle. You will receive a letter today with date time and information pertaining to the procedure. You will need someone to drive you to the procedure, stay with you during the procedure, and stay with you after the procedure. You will also need someone to stay with you for 24 hours after anesthesia to ensure you have cleared and are doing well. You will follow-up with me 1 week after the procedure to review the results and to ensure you are doing well. Call if you need us  prior to the procedure or if you have any questions at all. We are going to take great care of you.  Please contact office for sooner follow up if symptoms do not improve or worsen or seek emergency care

## 2023-10-09 ENCOUNTER — Encounter (HOSPITAL_COMMUNITY): Payer: Self-pay | Admitting: Emergency Medicine

## 2023-10-09 ENCOUNTER — Other Ambulatory Visit: Payer: Self-pay

## 2023-10-10 ENCOUNTER — Other Ambulatory Visit: Payer: Self-pay

## 2023-10-12 NOTE — Progress Notes (Signed)
 [facility] OFFICE PROGRESS NOTE  Donley Furth, MD [address] 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 He initially presented with skin cancer around the parotid gland in April 2022.   PRIOR THERAPY: 1) status post left parotidectomy with skin resection and rotational flap completed by Dr. Donalee Fruits in April 2022. Final pathology showed squamous cell carcinoma.  2) status post radiation therapy under the care of Dr. Lurena Sally completing 33 fractions on December 19, 2020 for a total of 66 Gray  3) Libtayo  (Cempilimab) 350 Mg IV every 3 weeks started August 10, 2021.  Status post 35  cycles of treatment.   CURRENT THERAPY: Referral to radiation oncology   INTERVAL HISTORY: Shaun Mcmillan 83 y.o. male returns to the clinic today for a follow-up visit accompanied by his wife. The patient was last seen by Dr. Marguerita Shih and myself on 09/25/23. In summary, he patient had a PET scan which showed left hilar mass/adenopathy.  Of note the patient is a never smoker. Dr. Marguerita Shih recommended biopsy of this to ensure it is related to his prior squamous cell carcinoma versus a new lung primary. The patient underwent bronchoscopy on 10/13/23 and tolerated it well.  He has a mild increase in cough since he had this procedure and he has been using cough drops.  Denies any shortness of breath or hemoptysis.  Otherwise, he denies changes in his health. He denies any fever, chills, or night sweats. His appetite is "Ok". He sees ENT and had a swallow study performed by a speech therapist previously for his aspiration/dysphagia. They told him to tuck his chin to the right when he swallows which has helped. Denies any chest pain, shortness of breath, or hemoptysis. Denies any nausea, vomiting, diarrhea, or constipation.  Denies any headache or visual changes.  Denies any rashes or skin changes. He sees Dr. Rochelle Chu from  dermatology. He is here today for evaluation and to review his biopsy results.   MEDICAL HISTORY: Past Medical History:  Diagnosis Date   Arthritis    Cancer (HCC)    Diabetes mellitus type II    Glaucoma    sees Dr. 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  MEDICATIONS:  Current Outpatient Medications  Medication Sig Dispense Refill   acetaminophen  (TYLENOL ) 500 MG tablet Take 1,000 mg by mouth every 8 (eight) hours as needed for moderate pain (pain score 4-6).     amLODipine  (NORVASC ) 10 MG tablet TAKE 1 TABLET BY MOUTH DAILY 100 tablet 2   aspirin  EC 81 MG tablet Take 1 tablet (81 mg total) by mouth daily. Swallow whole. 30 tablet 11   atorvastatin  (LIPITOR) 40 MG tablet TAKE 1 TABLET BY MOUTH DAILY 100 tablet 2   Cholecalciferol  (VITAMIN D  PO) Take 5,000 Units by mouth daily.     gabapentin  (NEURONTIN ) 300 MG capsule TAKE 2 CAPSULES BY MOUTH 3 TIMES A DAY 180 capsule 1   latanoprost  (XALATAN ) 0.005 % ophthalmic solution Place 1 drop into both eyes every morning. [facility]  [address]     LORazepam  (ATIVAN ) 1 MG tablet Take 1 tablet (  1 mg total) by mouth 3 (three) times daily as needed for anxiety. 60 tablet 5   losartan  (COZAAR ) 25 MG tablet TAKE 1 TABLET BY MOUTH DAILY 100 tablet 2   Melatonin 5 MG TABS Take 5 mg by mouth at bedtime. [facility] [address]     metFORMIN  (GLUCOPHAGE ) 500 MG tablet TAKE 1 TABLET BY MOUTH TWICE  DAILY WITH A MEAL 200 tablet 2   Multiple Vitamin (MULTIVITAMIN) tablet Take 1 tablet by mouth daily.     prochlorperazine  (COMPAZINE ) 10 MG tablet Take 1 tablet (10 mg total) by mouth every 6 (six) hours as needed for nausea or vomiting. 30 tablet 0   timolol  (BETIMOL ) 0.5 % ophthalmic solution Place 1 drop into both eyes daily. [facility] [address]      No current facility-administered medications for this visit.    SURGICAL HISTORY:  Past Surgical History:  Procedure Laterality Date   APPENDECTOMY     colonscopy  03/12/2016   per Dr. Lavaughn Portland, benign polyps, repeat in 5 yrs   EYE SURGERY Bilateral    cataracts   left craniotomy  11/03/2008   per Dr. Waymond Hailey for a subdrual hematoma   PAROTIDECTOMY Left 09/20/2020   Procedure: PAROTIDECTOMY with resection of skin and rotational flap;  Surgeon: Janita Mellow, MD;  Location: [facility];  Service: ENT;  Laterality: Left;   surgery for ocmpound fracture     right leg    REVIEW OF SYSTEMS:   Constitutional: Positive for stable fatigue. Negative for chills, fatigue, fever and unexpected weight change.  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 Positive for stable dysphagia. Negative for mouth sores, nosebleeds, sore throat.  Eyes: Negative for eye problems and icterus.  Respiratory: Positive for cough. Negative for hemoptysis, shortness of breath and wheezing.   Cardiovascular: Negative for chest pain and leg swelling.  Gastrointestinal: Negative for abdominal pain, constipation, diarrhea, nausea and vomiting.  Genitourinary: Negative for bladder incontinence, difficulty urinating, dysuria, frequency and hematuria.   Musculoskeletal: Negative for back pain, gait problem, neck pain and neck stiffness.  Skin: Negative for itching and rash.  Neurological: Negative for dizziness, extremity weakness, gait problem, headaches, light-headedness and seizures.  Hematological: Negative for adenopathy. Does not bruise/bleed easily.  Psychiatric/Behavioral: Negative for confusion, depression and sleep disturbance. The patient is not nervous/anxious.    PHYSICAL EXAMINATION:  There were no vitals taken for this visit.  ECOG PERFORMANCE STATUS: 1  Physical Exam  Constitutional: Oriented to person, place, and time and cachetic appearing male and in no distress.  Head:  Normocephalic and atraumatic.  Mouth/Throat: Oropharynx is clear and moist. No oropharyngeal exudate.  Eyes: Conjunctivae are normal. Right eye exhibits no discharge. Left eye exhibits no discharge. No scleral icterus.  Neck: Normal range of motion. Neck supple.  Cardiovascular: Normal rate, regular rhythm, normal heart sounds and intact distal pulses.   Pulmonary/Chest: Effort normal and breath sounds normal. He had some mucus that cleared with coughing. No respiratory distress. No wheezes. No rales.  Abdominal: Soft. Bowel sounds are normal. Exhibits no distension and no mass. There is no tenderness.  Musculoskeletal: Normal range of motion. Exhibits no edema.  Lymphadenopathy:    No cervical adenopathy.  Neurological: Alert and oriented to person, place, and time. Exhibits muscle wasting. Gait normal. Coordination normal.  Skin: Skin is warm and dry. No rash noted. Not diaphoretic. No erythema. No pallor.  Psychiatric: Mood, memory and judgment normal.  Vitals reviewed.  LABORATORY DATA: Lab Results  Component  Value Date   WBC 8.6 09/25/2023   HGB 12.7 (L) 09/25/2023   HCT 37.4 (L) 09/25/2023   MCV 85.8 09/25/2023   PLT 227 09/25/2023      Chemistry      Component Value Date/Time   NA 141 09/25/2023 1312   K 3.7 09/25/2023 1312   CL 107 09/25/2023 1312   CO2 29 09/25/2023 1312   BUN 15 09/25/2023 1312   CREATININE 0.88 09/25/2023 1312   CREATININE 0.74 09/03/2023 1302   CREATININE 0.69 (L) 01/08/2021 1553      Component Value Date/Time   CALCIUM  9.3 09/25/2023 1312   ALKPHOS 114 09/25/2023 1312   AST 16 09/25/2023 1312   AST 18 09/03/2023 1302   ALT 16 09/25/2023 1312   ALT 20 09/03/2023 1302   BILITOT 0.5 09/25/2023 1312   BILITOT 1.0 09/03/2023 1302       RADIOGRAPHIC STUDIES:  NM PET Image Restage (PS) Skull Base to Thigh (F-18 FDG) Result Date: 09/25/2023 CLINICAL DATA:  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  On immunotherapy. EXAM: NUCLEAR MEDICINE PET SKULL BASE TO THIGH TECHNIQUE: 6.0 mCi F-18 FDG was injected intravenously. Full-ring PET imaging was performed from the skull base to thigh after the radiotracer. CT data was obtained and used for attenuation correction and anatomic localization. Fasting blood glucose: 116 mg/dl COMPARISON:  Chest CT 40/98/1191. Neck CT 09/03/2023. Most recent PET of 10/06/2020 FINDINGS: Mediastinal blood pool activity: SUV max 2.3 Liver activity: SUV max NA NECK: Left parotid resection without hypermetabolic residual or recurrent disease. No cervical nodal hypermetabolism. Incidental CT findings: Bilateral carotid atherosclerosis. No cervical adenopathy. Left frontal incompletely imaged craniotomy. CHEST: Hypermetabolism corresponding to the left hilar mass on recent diagnostic CT. On the order of 3.3 x 2.1 cm and a S.U.V. max of 20.8 on 84/4. Incidental CT findings: Deferred to recent diagnostic CT. Aortic and coronary artery calcification. Lingular mucoid impaction and peribronchovascular nodularity are relatively similar. Bibasilar scarring. ABDOMEN/PELVIS: No abdominopelvic parenchymal or nodal hypermetabolism. Incidental CT findings: Normal adrenal glands. Upper pole right renal 5.3 cm fluid density lesion is most likely a cyst . In the absence of clinically indicated signs/symptoms require(s) no independent follow-up. Abdominal aortic atherosclerosis. SKELETON: No evidence of hypermetabolic osseous metastasis. Right-sided facet hypermetabolism at approximately the C5-6 level is favored to be degenerative. Incidental CT findings: None. IMPRESSION: 1. The left hilar mass/adenopathy described on recent diagnostic chest CT is hypermetabolic, consistent with metastasis or less likely new primary. 2. No other hypermetabolic metastatic disease. 3. Incidental findings, including: Coronary artery atherosclerosis. Aortic Atherosclerosis (ICD10-I70.0). Electronically Signed   By: Lore Rode M.D.   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  He was initially diagnosed in April 2022 with involvement of the skin around the left parotid gland status post left parotidectomy with skin resection followed by adjuvant radiation.  He then had evidence of disease progression with metastases to the lung in February 2023.   The patient completed 35 cycles (2 years) of immunotherapy with  Libtayo  (Cempilimab) 350 Mg IV every 3 weeks status post 35 cycles.    The patient had a restaging CT scan performed recently that showed  a larger, necrotic lymph node in the left hilar area, suggesting possible disease activity.    Dr. Marguerita Shih recommended a PET scan to further evaluate this.  The scan showed just the hypermetabolic hilar lymph node.  Dr. Marguerita Shih recommends a biopsy to confirm.   He had the biopsy on 10/13/23 which showed non-small cell carcinoma squamous cell carcinoma.   The patient was seen with Dr. Marguerita Shih. Dr. Marguerita Shih reviewed the biopsy results which showed non-small cell carcinoma/squamous cell carcinoma.  The patient is a never smoker it is likely that the squamous cell carcinoma secondary to his stage IV skin squamous cell carcinoma as Dr. Marguerita Shih would expect adenocarcinoma in a never smoker if primary lung.  Since there is only 1 area of active disease, Dr. Marguerita Shih recommends localized treatment with radiation to the left hilar area.  If the patient ever has progression in the future, then we would discuss systemic therapy options.  I have placed a repeat referral to Dr. Lurena Sally as the patient has seen her in the past.  We will see the patient back with a repeat CT scan of the neck and chest in 3 months.  We will see him 1 week later to review the results in the office.  He will continue to follow with dermatology.    The patient was advised  to call immediately if she has any concerning symptoms in the interval. The patient voices understanding of current disease status and treatment options and is in agreement with the current care plan. All questions were answered. The patient knows to call the clinic with any problems, questions or concerns. We can certainly see the patient much sooner if necessary  No orders of the defined types were placed in this encounter.   Channon Ambrosini L Laelyn Blumenthal, PA-C 10/12/23  ADDENDUM: Hematology/Oncology Attending: I had a face-to-face encounter with the patient today.  I reviewed his record, lab, scans as well as the recent pathology report and recommended his care plan.  This is a very pleasant 83 years old white male with history of stage IV squamous cell carcinoma of the skin with pulmonary involvement diagnosed in 2023 and initially has squamous cell carcinoma of the skin around the parotid gland in April 2022 status post left parotidectomy with a skin resection and rotational flap.  He is then received adjuvant radiotherapy under the care of Dr. Lurena Sally completed in July 2022.  The patient was then treated with 2 years of immunotherapy with Libtayo  (Cempilimab) every 3 weeks last dose was [facility] January 2025.  The patient had repeat CT scan of the neck and the chest that incidentally found left hilar soft tissue mass/lymphadenopathy this was followed by a PET scan that showed the hypermetabolic left hilar mass/lymphadenopathy.  The patient was referred to Dr. Baldwin Levee with pulmonary medicine and bronchoscopy with biopsy of this lesion was consistent with non-small cell carcinoma, squamous cell carcinoma. The patient is here today for evaluation and discussion of his treatment options. I had a lengthy discussion with the patient and his wife today about his current condition and treatment options. I recommended for the patient to see Dr. Lurena Sally for consideration of curative radiotherapy to this lesion.   Will continue to monitor him since there is no other hypermetabolic lesions in the lung or extrathoracic. He will come back for follow-up visit in 3 months for evaluation with repeat CT scan of the neck and the chest for restaging of his disease. The patient was advised to call immediately if he has any other concerning symptoms in the interval. The total time spent in the appointment was 30 minutes. Disclaimer: This note was dictated with voice recognition software. Similar  sounding words can inadvertently be transcribed and may be missed upon review. Aurelio Blower, MD

## 2023-10-13 ENCOUNTER — Other Ambulatory Visit: Payer: Self-pay

## 2023-10-13 ENCOUNTER — Ambulatory Visit (HOSPITAL_BASED_OUTPATIENT_CLINIC_OR_DEPARTMENT_OTHER): Admitting: Anesthesiology

## 2023-10-13 ENCOUNTER — Ambulatory Visit (HOSPITAL_COMMUNITY)

## 2023-10-13 ENCOUNTER — Encounter (HOSPITAL_COMMUNITY)
Admission: RE | Disposition: A | Discharge status: Home / Self Care | Payer: Self-pay | Source: Home / Self Care | Attending: Emergency Medicine

## 2023-10-13 ENCOUNTER — Ambulatory Visit (HOSPITAL_COMMUNITY): Admitting: Anesthesiology

## 2023-10-13 ENCOUNTER — Encounter (HOSPITAL_COMMUNITY): Payer: Self-pay | Admitting: Emergency Medicine

## 2023-10-13 ENCOUNTER — Ambulatory Visit (HOSPITAL_COMMUNITY)
Admission: RE | Admit: 2023-10-13 | Discharge: 2023-10-13 | Disposition: A | Discharge status: Home / Self Care | Attending: Emergency Medicine | Admitting: Emergency Medicine

## 2023-10-13 DIAGNOSIS — Z9221 Personal history of antineoplastic chemotherapy: Secondary | ICD-10-CM | POA: Diagnosis not present

## 2023-10-13 DIAGNOSIS — Z923 Personal history of irradiation: Secondary | ICD-10-CM | POA: Diagnosis not present

## 2023-10-13 DIAGNOSIS — R599 Enlarged lymph nodes, unspecified: Secondary | ICD-10-CM

## 2023-10-13 DIAGNOSIS — M1990 Unspecified osteoarthritis, unspecified site: Secondary | ICD-10-CM | POA: Diagnosis not present

## 2023-10-13 DIAGNOSIS — Z7982 Long term (current) use of aspirin: Secondary | ICD-10-CM | POA: Insufficient documentation

## 2023-10-13 DIAGNOSIS — C3492 Malignant neoplasm of unspecified part of left bronchus or lung: Secondary | ICD-10-CM | POA: Diagnosis not present

## 2023-10-13 DIAGNOSIS — I10 Essential (primary) hypertension: Secondary | ICD-10-CM | POA: Insufficient documentation

## 2023-10-13 DIAGNOSIS — Z7984 Long term (current) use of oral hypoglycemic drugs: Secondary | ICD-10-CM | POA: Diagnosis not present

## 2023-10-13 DIAGNOSIS — Z85828 Personal history of other malignant neoplasm of skin: Secondary | ICD-10-CM | POA: Insufficient documentation

## 2023-10-13 DIAGNOSIS — R918 Other nonspecific abnormal finding of lung field: Secondary | ICD-10-CM

## 2023-10-13 DIAGNOSIS — E119 Type 2 diabetes mellitus without complications: Secondary | ICD-10-CM | POA: Insufficient documentation

## 2023-10-13 DIAGNOSIS — C3412 Malignant neoplasm of upper lobe, left bronchus or lung: Secondary | ICD-10-CM | POA: Diagnosis not present

## 2023-10-13 DIAGNOSIS — Z79899 Other long term (current) drug therapy: Secondary | ICD-10-CM | POA: Diagnosis not present

## 2023-10-13 HISTORY — DX: Malignant (primary) neoplasm, unspecified: C80.1

## 2023-10-13 LAB — GLUCOSE, CAPILLARY
Glucose-Capillary: 158 mg/dL — ABNORMAL HIGH (ref 70–99)
Glucose-Capillary: 128 mg/dL — ABNORMAL HIGH (ref 70–99)
Glucose-Capillary: 120 mg/dL — ABNORMAL HIGH (ref 70–99)

## 2023-10-13 SURGERY — BRONCHOSCOPY, WITH EBUS
Anesthesia: General

## 2023-10-13 MED ORDER — LIDOCAINE 2% (20 MG/ML) 5 ML SYRINGE
INTRAMUSCULAR | Status: DC | PRN
  Administered 2023-10-13: 60 mg via INTRAVENOUS

## 2023-10-13 MED ORDER — ACETAMINOPHEN 500 MG PO TABS: 1000.0000 mg | ORAL_TABLET | Freq: Once | ORAL | Status: DC

## 2023-10-13 MED ORDER — FENTANYL CITRATE (PF) 100 MCG/2ML IJ SOLN: 25.0000 ug | INTRAMUSCULAR | Status: DC | PRN

## 2023-10-13 MED ORDER — PROPOFOL 500 MG/50ML IV EMUL
INTRAVENOUS | Status: DC | PRN
  Administered 2023-10-13: 125 ug/kg/min via INTRAVENOUS

## 2023-10-13 MED ORDER — ONDANSETRON HCL 4 MG/2ML IJ SOLN
INTRAMUSCULAR | Status: DC | PRN
Start: 2023-10-13 — End: 2023-10-13
  Administered 2023-10-13: 4 mg via INTRAVENOUS

## 2023-10-13 MED ORDER — PHENYLEPHRINE HCL (PRESSORS) 10 MG/ML IV SOLN
INTRAVENOUS | Status: DC | PRN
Start: 2023-10-13 — End: 2023-10-13
  Administered 2023-10-13: 80 ug via INTRAVENOUS

## 2023-10-13 MED ORDER — LORAZEPAM 1 MG PO TABS: 1.0000 mg | ORAL_TABLET | Freq: Every day | ORAL | Status: AC | PRN

## 2023-10-13 MED ORDER — CHLORHEXIDINE GLUCONATE 0.12 % MT SOLN: 15.0000 mL | Freq: Once | OROMUCOSAL | Status: DC

## 2023-10-13 MED ORDER — PHENYLEPHRINE HCL-NACL 20-0.9 MG/250ML-% IV SOLN
INTRAVENOUS | Status: DC | PRN
  Administered 2023-10-13: 40 ug/min via INTRAVENOUS

## 2023-10-13 MED ORDER — PROPOFOL 10 MG/ML IV BOLUS
INTRAVENOUS | Status: DC | PRN
  Administered 2023-10-13: 100 mg via INTRAVENOUS

## 2023-10-13 MED ORDER — PHENYLEPHRINE 80 MCG/ML (10ML) SYRINGE FOR IV PUSH (FOR BLOOD PRESSURE SUPPORT)
PREFILLED_SYRINGE | INTRAVENOUS | Status: DC | PRN
Start: 2023-10-13 — End: 2023-10-13
  Administered 2023-10-13: 80 ug via INTRAVENOUS

## 2023-10-13 MED ORDER — ONDANSETRON HCL 4 MG/2ML IJ SOLN: 4.0000 mg | Freq: Once | INTRAMUSCULAR | Status: DC | PRN

## 2023-10-13 MED ORDER — SUGAMMADEX SODIUM 200 MG/2ML IV SOLN
INTRAVENOUS | Status: DC | PRN
  Administered 2023-10-13: 200 mg via INTRAVENOUS

## 2023-10-13 MED ORDER — DEXAMETHASONE SODIUM PHOSPHATE 10 MG/ML IJ SOLN
INTRAMUSCULAR | Status: DC | PRN
  Administered 2023-10-13: 10 mg via INTRAVENOUS

## 2023-10-13 MED ORDER — INSULIN ASPART 100 UNIT/ML IJ SOLN: 0.0000 [IU] | INTRAMUSCULAR | Status: DC | PRN

## 2023-10-13 MED ORDER — ROCURONIUM BROMIDE 10 MG/ML (PF) SYRINGE
PREFILLED_SYRINGE | INTRAVENOUS | Status: DC | PRN
  Administered 2023-10-13: 50 mg via INTRAVENOUS

## 2023-10-13 MED ORDER — LACTATED RINGERS IV SOLN: INTRAVENOUS | Status: DC

## 2023-10-13 NOTE — Anesthesia Postprocedure Evaluation (Signed)
 Anesthesia Post Note  Patient: Shaun Mcmillan  Procedure(s) Performed: BRONCHOSCOPY, WITH EBUS BRONCHOSCOPY, WITH BRUSH BIOPSY BRONCHOSCOPY, WITH BIOPSY BRONCHOSCOPY, WITH NEEDLE ASPIRATION BIOPSY     Patient location during evaluation: PACU Anesthesia Type: General Level of consciousness: awake and alert, oriented and patient cooperative Pain management: pain level controlled Vital Signs Assessment: post-procedure vital signs reviewed and stable Respiratory status: spontaneous breathing, nonlabored ventilation and respiratory function stable Cardiovascular status: blood pressure returned to baseline and stable Postop Assessment: no apparent nausea or vomiting Anesthetic complications: no   No notable events documented.  Last Vitals:  Vitals:   10/13/23 1228 10/13/23 1230  BP: 114/68 128/82  Pulse: 66 69  Resp: 20 11  Temp: 36.5 C   SpO2: 97% 97%    Last Pain:  Vitals:   10/13/23 1228  TempSrc:   PainSc: 0-No pain                 Jacquelyne Matte

## 2023-10-13 NOTE — Progress Notes (Signed)
 Today's EKG reviewed by Dr. Wendy Hamel. No new orders received.

## 2023-10-13 NOTE — Anesthesia Procedure Notes (Signed)
 Procedure Name: Intubation Date/Time: 10/13/2023 11:21 AM  Performed by: Hershall Lory, CRNAPre-anesthesia Checklist: Patient identified Patient Re-evaluated:Patient Re-evaluated prior to induction Oxygen Delivery Method: Circle system utilized Preoxygenation: Pre-oxygenation with 100% oxygen Induction Type: IV induction Ventilation: Mask ventilation without difficulty Laryngoscope Size: Glidescope and 3 Grade View: Grade II Tube type: Oral Tube size: 8.5 mm Number of attempts: 1 Airway Equipment and Method: Stylet and Oral airway Placement Confirmation: ETT inserted through vocal cords under direct vision, positive ETCO2 and breath sounds checked- equal and bilateral Secured at: 22 cm Tube secured with: Tape Dental Injury: Teeth and Oropharynx as per pre-operative assessment

## 2023-10-13 NOTE — Op Note (Signed)
 Video Bronchoscopy with Endobronchial Ultrasound Procedure Note  Date of Operation: 10/13/2023  Pre-op Diagnosis: Left hilar mass  Post-op Diagnosis: Same  Surgeon: Racheal Buddle  Assistants: None  Anesthesia: General endotracheal anesthesia  Operation: Flexible video fiberoptic bronchoscopy with endobronchial ultrasound and biopsies.  Estimated Blood Loss: 20 cc  Complications: None apparent  Indications and History: Shaun Mcmillan is a 83 y.o. male with history of stage IV squamous cell skin cancer.  He was found to have a new hypermetabolic left hilar mass on surveillance imaging.  Recommendation made to achieve a tissue diagnosis via endobronchial ultrasound with biopsies.  The risks, benefits, complications, treatment options and expected outcomes were discussed with the patient.  The possibilities of pneumothorax, pneumonia, reaction to medication, pulmonary aspiration, perforation of a viscus, bleeding, failure to diagnose a condition and creating a complication requiring transfusion or operation were discussed with the patient who freely signed the consent.    Description of Procedure: The patient was examined in the preoperative area and history and data from the preprocedure consultation were reviewed. It was deemed appropriate to proceed.  The patient was taken to [facility] room 3, identified as [name] and the procedure verified as Flexible Video Fiberoptic Bronchoscopy.  A Time Out was held and the above information confirmed. After being taken to the operating room general anesthesia was initiated and the patient  was orally intubated. The video fiberoptic bronchoscope was introduced via the endotracheal tube and a general inspection was performed which showed normal airways on the right.  The left upper lobe carina was lobulated and splayed with a more discrete endobronchial lesion noted in the very proximal left upper lobe airway.  Endobronchial brushings  and endobronchial forceps biopsies were performed in the left upper lobe airway for cytopathology. The standard scope was then withdrawn and the endobronchial ultrasound was used to identify and characterize the peritracheal, hilar and bronchial lymph nodes. Inspection showed a large left hilar mass and slightly enlarged 4L lymph node. Using real-time ultrasound guidance Wang needle biopsies were take from left hilar mass and Station 4L node and were sent for cytology. The patient tolerated the procedure well without apparent complications. There was no significant blood loss. The bronchoscope was withdrawn. Anesthesia was reversed and the patient was taken to the PACU for recovery.   Samples: 1. Wang needle biopsies from left hilar mass 2. Wang needle biopsies from 4L node 3.  Endobronchial brushings left upper lobe airway 4.  Endobronchial forceps biopsies left upper lobe airway  Plans:  The patient will be discharged from the PACU to home when recovered from anesthesia. We will review the cytology, pathology and microbiology results with the patient when they become available. Outpatient followup will be with Braden Caddy, NP, Dr. Baldwin Levee.    Shaun Loren S. 10/13/2023

## 2023-10-13 NOTE — Discharge Instructions (Addendum)
 Flexible Bronchoscopy, Care After This sheet gives you information about how to care for yourself after your test. Your doctor may also give you more specific instructions. If you have problems or questions, contact your doctor. Follow these instructions at home: Eating and drinking When you are wide awake, your numbness is gone and your cough and gag reflexes have come back, you may: Start eating only soft foods. Slowly drink liquids. Six hours after the test, go back to your normal diet. Driving Do not drive for 24 hours if you were given a medicine to help you relax (sedative). Do not drive or use heavy machinery while taking prescription pain medicine. General instructions Take over-the-counter and prescription medicines only as told by your doctor. Return to your normal activities as told. Ask what activities are safe for you. Do not use any products that have nicotine or tobacco in them. This includes cigarettes and e-cigarettes. If you need help quitting, ask your doctor. Keep all follow-up visits as told by your doctor. This is important. It is very important if you had a tissue sample (biopsy) taken. Get help right away if: You have shortness of breath that gets worse. You get light-headed. You feel like you are going to pass out (faint). You have chest pain. You cough up: More than a little blood. More blood than before. Summary Do not use cigarettes. Do not use e-cigarettes. Seek care in the Emergency Department right away if you have chest pain or shortness of breath. Call or MyChart Message our office for any questions or problems at [phone].    This information is not intended to replace advice given to you by your health care provider. Make sure you discuss any questions you have with your health care provider.

## 2023-10-13 NOTE — Interval H&P Note (Signed)
 History and Physical Interval Note:  10/13/2023 9:54 AM  Shaun Mcmillan  has presented today for surgery, with the diagnosis of Lymph node enlargement.  The various methods of treatment have been discussed with the patient and family. After consideration of risks, benefits and other options for treatment, the patient has consented to  Procedure(s) with comments: BRONCHOSCOPY, WITH BIOPSY USING ELECTROMAGNETIC NAVIGATION (N/A) BRONCHOSCOPY, WITH EBUS (N/A) - with fluoro as a surgical intervention.  The patient's history has been reviewed, patient examined, no change in status, stable for surgery.  I have reviewed the patient's chart and labs.  Questions were answered to the patient's satisfaction.     Denson Flake

## 2023-10-13 NOTE — Anesthesia Preprocedure Evaluation (Addendum)
 Anesthesia Evaluation  Patient identified by MRN, date of birth, ID band Patient awake    Reviewed: Allergy & Precautions, H&P , NPO status , Patient's Chart, lab work & pertinent test results  Airway Mallampati: II  TM Distance: >3 FB Neck ROM: Full    Dental  (+) Teeth Intact, Dental Advisory Given   Pulmonary  PMH of squamous cell carcinoma of the skin referred to Dr. Baldwin Levee for consideration of navigational bronchoscopy with biopsies of a left hilar lymph node which was hypermetabolic on PET   Pulmonary exam normal breath sounds clear to auscultation       Cardiovascular hypertension (143/97 preop, per pt normally 120s SBP), Pt. on medications Normal cardiovascular exam Rhythm:Regular Rate:Normal     Neuro/Psych  Headaches  negative psych ROS   GI/Hepatic negative GI ROS, Neg liver ROS,,,  Endo/Other  diabetes, Well Controlled, Type 2, Oral Hypoglycemic Agents    Renal/GU negative Renal ROS  negative genitourinary   Musculoskeletal  (+) Arthritis , Osteoarthritis,    Abdominal   Peds negative pediatric ROS (+)  Hematology negative hematology ROS (+)   Anesthesia Other Findings   Reproductive/Obstetrics negative OB ROS                             Anesthesia Physical Anesthesia Plan  ASA: 2  Anesthesia Plan: General   Post-op Pain Management: Tylenol  PO (pre-op)*   Induction: Intravenous  PONV Risk Score and Plan: 2 and Ondansetron , Dexamethasone  and Treatment may vary due to age or medical condition  Airway Management Planned: Oral ETT  Additional Equipment: None  Intra-op Plan:   Post-operative Plan: Extubation in OR  Informed Consent: I have reviewed the patients History and Physical, chart, labs and discussed the procedure including the risks, benefits and alternatives for the proposed anesthesia with the patient or authorized representative who has indicated his/her  understanding and acceptance.     Dental advisory given  Plan Discussed with: CRNA  Anesthesia Plan Comments:        Anesthesia Quick Evaluation

## 2023-10-13 NOTE — Transfer of Care (Signed)
 Immediate Anesthesia Transfer of Care Note  Patient: Shaun Mcmillan  Procedure(s) Performed: BRONCHOSCOPY, WITH EBUS BRONCHOSCOPY, WITH BRUSH BIOPSY BRONCHOSCOPY, WITH BIOPSY BRONCHOSCOPY, WITH NEEDLE ASPIRATION BIOPSY  Patient Location: PACU  Anesthesia Type:General  Level of Consciousness: awake and drowsy  Airway & Oxygen Therapy: Patient Spontanous Breathing and Patient connected to nasal cannula oxygen  Post-op Assessment: Report given to RN and Post -op Vital signs reviewed and stable  Post vital signs: Reviewed and stable  Last Vitals:  Vitals Value Taken Time  BP 114/68 10/13/23 1228  Temp    Pulse 57 10/13/23 1231  Resp 14 10/13/23 1231  SpO2 97 % 10/13/23 1231  Vitals shown include unfiled device data.  Last Pain:  Vitals:   10/13/23 0918  TempSrc:   PainSc: 0-No pain      Patients Stated Pain Goal: 0 (10/13/23 0914)  Complications: No notable events documented.

## 2023-10-14 LAB — CYTOLOGY - NON PAP

## 2023-10-15 ENCOUNTER — Encounter (HOSPITAL_COMMUNITY): Payer: Self-pay | Admitting: Emergency Medicine

## 2023-10-16 ENCOUNTER — Inpatient Hospital Stay: Admitting: Physician Assistant

## 2023-10-16 ENCOUNTER — Inpatient Hospital Stay: Attending: Internal Medicine

## 2023-10-16 DIAGNOSIS — Z85828 Personal history of other malignant neoplasm of skin: Secondary | ICD-10-CM | POA: Insufficient documentation

## 2023-10-16 DIAGNOSIS — Z79899 Other long term (current) drug therapy: Secondary | ICD-10-CM | POA: Diagnosis not present

## 2023-10-16 DIAGNOSIS — C7800 Secondary malignant neoplasm of unspecified lung: Secondary | ICD-10-CM | POA: Diagnosis not present

## 2023-10-16 DIAGNOSIS — C7989 Secondary malignant neoplasm of other specified sites: Secondary | ICD-10-CM

## 2023-10-16 DIAGNOSIS — Z923 Personal history of irradiation: Secondary | ICD-10-CM | POA: Diagnosis not present

## 2023-10-16 LAB — CBC WITH DIFFERENTIAL (CANCER CENTER ONLY)
Abs Immature Granulocytes: 0.04 10*3/uL (ref 0.00–0.07)
Basophils Absolute: 0 10*3/uL (ref 0.0–0.1)
Basophils Relative: 0 %
Eosinophils Absolute: 0.2 10*3/uL (ref 0.0–0.5)
Eosinophils Relative: 2 %
HCT: 38.9 % — ABNORMAL LOW (ref 39.0–52.0)
Hemoglobin: 13.1 g/dL (ref 13.0–17.0)
Immature Granulocytes: 0 %
Lymphocytes Relative: 15 %
Lymphs Abs: 1.4 10*3/uL (ref 0.7–4.0)
MCH: 29.4 pg (ref 26.0–34.0)
MCHC: 33.7 g/dL (ref 30.0–36.0)
MCV: 87.2 fL (ref 80.0–100.0)
Monocytes Absolute: 0.7 10*3/uL (ref 0.1–1.0)
Monocytes Relative: 7 %
Neutro Abs: 6.9 10*3/uL (ref 1.7–7.7)
Neutrophils Relative %: 76 %
Platelet Count: 238 10*3/uL (ref 150–400)
RBC: 4.46 MIL/uL (ref 4.22–5.81)
RDW: 13.7 % (ref 11.5–15.5)
WBC Count: 9.2 10*3/uL (ref 4.0–10.5)
nRBC: 0 % (ref 0.0–0.2)

## 2023-10-16 LAB — CMP (CANCER CENTER ONLY)
ALT: 13 U/L (ref 0–44)
AST: 16 U/L (ref 15–41)
Albumin: 4 g/dL (ref 3.5–5.0)
Alkaline Phosphatase: 116 U/L (ref 38–126)
Anion gap: 5 (ref 5–15)
BUN: 14 mg/dL (ref 8–23)
CO2: 30 mmol/L (ref 22–32)
Calcium: 9.5 mg/dL (ref 8.9–10.3)
Chloride: 104 mmol/L (ref 98–111)
Creatinine: 0.79 mg/dL (ref 0.61–1.24)
GFR, Estimated: >60 mL/min (ref >60)
Glucose, Bld: 93 mg/dL (ref 70–99)
Potassium: 3.9 mmol/L (ref 3.5–5.1)
Sodium: 139 mmol/L (ref 135–145)
Total Bilirubin: 0.8 mg/dL (ref 0.0–1.2)
Total Protein: 6.8 g/dL (ref 6.5–8.1)

## 2023-10-16 LAB — TSH: TSH: 2.39 u[IU]/mL (ref 0.350–4.500)

## 2023-10-17 ENCOUNTER — Telehealth: Payer: Self-pay | Admitting: Physician Assistant

## 2023-10-17 ENCOUNTER — Telehealth: Payer: Self-pay | Admitting: Medical Oncology

## 2023-10-17 LAB — T4: T4, Total: 7.1 ug/dL (ref 4.5–12.0)

## 2023-10-17 NOTE — Telephone Encounter (Signed)
 The patient had some follow-up questions from his visit yesterday.  He had some questions regarding clarification for radiation that he would be receiving.  He is scheduled to see Dr. Lurena Sally on 5/14 and I discussed that she will discuss the logistics of radiation at that time including the type of radiation and how many sessions she is anticipating.  However, if he is not a candidate for radiation then we would see him back earlier.  However, if he does receive radiation then he is already scheduled for his follow-up CT scan in late July and his follow-up with Dr. Marguerita Shih on 01/19/24.  He was appreciative of the call.

## 2023-10-17 NOTE — Telephone Encounter (Signed)
 Requests phone call today about his provider visit yesterday.

## 2023-10-21 ENCOUNTER — Ambulatory Visit: Admitting: Acute Care

## 2023-10-21 ENCOUNTER — Encounter: Payer: Self-pay | Admitting: Radiation Oncology

## 2023-10-21 ENCOUNTER — Encounter: Payer: Self-pay | Admitting: Acute Care

## 2023-10-21 VITALS — BP 128/62 | HR 83 | Ht 70.0 in | Wt 132.4 lb

## 2023-10-21 DIAGNOSIS — C3490 Malignant neoplasm of unspecified part of unspecified bronchus or lung: Secondary | ICD-10-CM

## 2023-10-21 DIAGNOSIS — C3412 Malignant neoplasm of upper lobe, left bronchus or lung: Secondary | ICD-10-CM

## 2023-10-21 DIAGNOSIS — Z9889 Other specified postprocedural states: Secondary | ICD-10-CM

## 2023-10-21 NOTE — Progress Notes (Signed)
 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   Past/Anticipated chemotherapy by medical oncology, if any:  Marguerita Shih, MD   09/11/2023 Marguerita Shih, MD     Interventions by Pulmonology:  Bronchoscopy with Biopsy using Electromagnetic Navigation  Pain on a scale of 0-10 is: None     Ambulatory status? Walker? Wheelchair?: None  SAFETY ISSUES: Prior radiation? Yes Pacemaker/ICD? None Possible current pregnancy? None Is the patient on methotrexate? None  Additional Complaints / other details:   None

## 2023-10-21 NOTE — Progress Notes (Signed)
 Radiation Oncology         [phone] ________________________________  Initial outpatient Re-Consultation  Name: Shaun Mcmillan MRN: [id number]  Date: 10/22/2023  DOB: May 16, 1941  CC:Fry, Lenis Quin, MD  Marlene Simas, MD   REFERRING PHYSICIAN: Marlene Simas, MD  DIAGNOSIS: No diagnosis found.   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 male who returns to the clinic today for re-evaluation and to discuss the role of further radiation therapy in management of his progressive recurrent lung cancer. Patient has a history of stage  IV squamous cell carcinoma of the skin with pulmonary involvement diagnosed in 2023. He initially presented with skin cancer around the parotid gland in April 2022. Status post left parotidectomy with skin resection and rotational flap completed by Dr. Donalee Fruits in April 2022 and radiation therapy. Patient was last seen in office on 11/07/21.  Current therapy consist of Libtayo  (Cempilimab) 350 Mg IV every 3 weeks started August 10, 2021.  Status post 35  cycles of treatment.       Patient presented for a routine imaging preformed on 09/03/23 consisting of: --CT chest showing necrotic left hilar adenopathy measures 1.9 x 3.5 Cm with  obstruction of the lingular bronchus and associated mucoid impaction and peribronchovascular nodularity in the lingula.   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   --CT neck showing stable and  satisfactory post treatment CT appearance of the Neck   In light of findings, during a follow up with Dr. Liam Redhead on 09/11/23, he recommended that patient undergo a bronchoscopy with biopsy to further evaluate enlarging lung nodule in fear of his history of lung cancer.   Subsequently, patient underwent a bronchoscopy with EBUS on 10/13/23 under the care of Dr. Baldwin Levee. Surgical pathology left lung brushing, left hilar mass, and left endobronchial biopsy revealed non-small cell carcinoma morphologically consistent with squamous cell carcinoma. 4L lymph node was negative for invasive disease.    Most recent PET scan preformed on 09/19/23 demonstrated a 3.3 x 2.1 cm hypermetabolic left hilar mass  consistent with metastasis or less likely new primary.No other hypermetabolic metastatic disease were identified.    PREVIOUS RADIATION THERAPY: Yes   11/01/2020 through 12/19/2020 Site Technique Total Dose (Gy) Dose per Fx (Gy) Completed Fx Beam Energies  Parotid, Left: HN_Lt_parotid IMRT 66/66 2 33/33 6X     PAST MEDICAL HISTORY:  has a past medical history of Arthritis, Cancer (HCC), Diabetes mellitus type II, Glaucoma, History of kidney stones, colonic polyps, Hyperlipidemia, Hypertension, Lung cancer (HCC), Migraines, Shingles (04/2010), and Subdural hematoma (HCC) (11/13/2008).    PAST SURGICAL HISTORY: Past Surgical History:  Procedure Laterality Date   APPENDECTOMY     BRONCHIAL BIOPSY  10/13/2023   Procedure: BRONCHOSCOPY, WITH BIOPSY;  Surgeon: Denson Flake, MD;  Location: [facility] ENDOSCOPY;  Service: Pulmonary;;   BRONCHIAL BRUSHINGS  10/13/2023   Procedure: BRONCHOSCOPY, WITH BRUSH BIOPSY;  Surgeon: Denson Flake, MD;  Location: [facility] ENDOSCOPY;  Service: Pulmonary;;   BRONCHIAL NEEDLE ASPIRATION BIOPSY  10/13/2023   Procedure: BRONCHOSCOPY, WITH NEEDLE  ASPIRATION BIOPSY;  Surgeon: Denson Flake, MD;  Location: [facility] ENDOSCOPY;  Service: Pulmonary;;   colonscopy  03/12/2016   per Dr. Lavaughn Portland, benign  polyps, repeat in 5 yrs   EYE SURGERY Bilateral    cataracts   left craniotomy  11/03/2008   per Dr. Waymond Hailey for a subdrual hematoma   PAROTIDECTOMY Left 09/20/2020   Procedure: PAROTIDECTOMY with resection of skin and rotational flap;  Surgeon: Janita Mellow, MD;  Location: [facility] OR;  Service: ENT;  Laterality: Left;   surgery for ocmpound fracture     right leg   VIDEO BRONCHOSCOPY WITH ENDOBRONCHIAL ULTRASOUND N/A 10/13/2023   Procedure: BRONCHOSCOPY, WITH EBUS;  Surgeon: Denson Flake, MD;  Location: [facility]COPY;  Service: Pulmonary;  Laterality: N/A;  with fluoro    FAMILY HISTORY: family history includes Cancer in his mother; Heart disease in an other family member.  SOCIAL HISTORY:  reports that he has never smoked. He has never been exposed to tobacco smoke. He has never used smokeless tobacco. He reports current alcohol use of about 1.0 standard drink of alcohol per week.  ALLERGIES: Patient has no known allergies.  MEDICATIONS:  Current Outpatient Medications  Medication Sig Dispense Refill   acetaminophen  (TYLENOL ) 500 MG tablet Take 1,000 mg by mouth every 8 (eight) hours as needed for moderate pain (pain score 4-6).     amLODipine  (NORVASC ) 10 MG tablet TAKE 1 TABLET BY MOUTH DAILY 100 tablet 2   aspirin  EC 81 MG tablet Take 1 tablet (81 mg total) by mouth daily. Swallow whole. 30 tablet 11   atorvastatin  (LIPITOR) 40 MG tablet TAKE 1 TABLET BY MOUTH DAILY 100 tablet 2   gabapentin  (NEURONTIN ) 300 MG capsule TAKE 2 CAPSULES BY MOUTH 3 TIMES A DAY (Patient taking differently: Take 300 mg by mouth 3 (three) times daily.) 180 capsule 1   latanoprost  (XALATAN ) 0.005 % ophthalmic solution Place 1 drop into both eyes at bedtime.     LORazepam  (ATIVAN ) 1 MG tablet Take 1 tablet (1 mg total) by mouth daily as needed for anxiety.     losartan  (COZAAR ) 25 MG tablet TAKE 1 TABLET BY MOUTH DAILY 100 tablet 2   Melatonin 5 MG TABS Take 5 mg by mouth at bedtime.     metFORMIN   (GLUCOPHAGE ) 500 MG tablet TAKE 1 TABLET BY MOUTH TWICE  DAILY WITH A MEAL 200 tablet 2   Multiple Vitamin (MULTIVITAMIN) tablet Take 1 tablet by mouth daily.     timolol  (BETIMOL ) 0.5 % ophthalmic solution Place 1 drop into both eyes daily.     No current facility-administered medications for this encounter.    REVIEW OF SYSTEMS:  Notable for that above.   PHYSICAL EXAM:  vitals were not taken for this visit.   General: Alert and oriented, in no acute distress *** HEENT: Head is normocephalic. Extraocular movements are intact. Oropharynx is clear. Neck: Neck is supple, no palpable cervical or supraclavicular lymphadenopathy. Heart: Regular in rate and rhythm with no murmurs, rubs, or gallops. Chest: Clear to auscultation bilaterally, with no rhonchi, wheezes, or rales. Abdomen: Soft, nontender, nondistended, with no rigidity or guarding. Extremities: No cyanosis or edema. Lymphatics: see Neck Exam Skin: No concerning lesions. Musculoskeletal: symmetric strength and muscle tone throughout. Neurologic: Cranial nerves II through XII are grossly intact. No obvious focalities. Speech is fluent. Coordination is intact. Psychiatric: Judgment and insight are intact. Affect is appropriate.   ECOG = ***  0 - Asymptomatic (Fully active, able to carry  on all predisease activities without restriction)  1 - Symptomatic but completely ambulatory (Restricted in physically strenuous activity but ambulatory and able to carry out work of a light or sedentary nature. For example, light housework, office work)  2 - Symptomatic, <50% in bed during the day (Ambulatory and capable of all self care but unable to carry out any work activities. Up and about more than 50% of waking hours)  3 - Symptomatic, >50% in bed, but not bedbound (Capable of only limited self-care, confined to bed or chair 50% or more of waking hours)  4 - Bedbound (Completely disabled. Cannot carry on any self-care. Totally confined to  bed or chair)  5 - Death   Aurea Blossom MM, Creech RH, Tormey DC, et al. [phone]). "Toxicity and response criteria of the [facility]". Am. Hillard Lowes. Oncol. 5 (6): 649-55   LABORATORY DATA:  Lab Results  Component Value Date   WBC 9.2 10/16/2023   HGB 13.1 10/16/2023   HCT 38.9 (L) 10/16/2023   MCV 87.2 10/16/2023   PLT 238 10/16/2023   CMP     Component Value Date/Time   NA 139 10/16/2023 1401   K 3.9 10/16/2023 1401   CL 104 10/16/2023 1401   CO2 30 10/16/2023 1401   GLUCOSE 93 10/16/2023 1401   BUN 14 10/16/2023 1401   CREATININE 0.79 10/16/2023 1401   CREATININE 0.69 (L) 01/08/2021 1553   CALCIUM  9.5 10/16/2023 1401   PROT 6.8 10/16/2023 1401   ALBUMIN 4.0 10/16/2023 1401   AST 16 10/16/2023 1401   ALT 13 10/16/2023 1401   ALKPHOS 116 10/16/2023 1401   BILITOT 0.8 10/16/2023 1401   GFR 83.11 09/05/2021 1020   GFRNONAA >60 10/16/2023 1401         RADIOGRAPHY: No results found.    IMPRESSION/PLAN:***    On date of service, in total, I spent *** minutes on this encounter. Patient was seen in person.   __________________________________________   Colie Dawes, MD  This document serves as a record of services personally performed by Colie Dawes, MD. It was created on her behalf by Lucky Sable, a trained medical scribe. The creation of this record is based on the scribe's personal observations and the provider's statements to them. This document has been checked and approved by the attending provider.

## 2023-10-21 NOTE — Progress Notes (Signed)
 History of Present Illness Shaun Mcmillan is a 83 y.o. male  never smoker with PMH of squamous cell carcinoma of the skin referred to Dr. Baldwin Levee 09/2023 for consideration of navigational bronchoscopy with biopsies of a left hilar lymph node which was hypermetabolic on PET.   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 He initially presented with skin cancer around the parotid gland in April 2022.  1) status post left parotidectomy with skin resection and rotational flap completed by Dr. Donalee Fruits in April 2022. Final pathology showed squamous cell carcinoma.  2) status post radiation therapy under the care of Dr. Lurena Sally completing 33 fractions on December 19, 2020 for a total of 66 Gray  3) Libtayo  (Cempilimab) 350 Mg IV every 3 weeks started August 10, 2021.  Status post 35  cycles of treatment.    Pt. Has consented to use of [employer] soft wear to help capture the content of this OV   10/21/2023 Follow up after bronchoscopy with biopsies on 10/13/2023. Pt. states he did well after the procedure. He did have scant bleeding which has self resolved.  He denies any fever, discolored secretions, worsening dyspnea than baseline, or adverse reaction to anesthesia.  We have reviewed his cytology results which were positive for squamous cell lung cancer in the left upper lobe, and left hilar mass.  These are suspicious for metastatic disease from his earlier squamous cell carcinoma of the skin and parotid gland diagnosed 2022 , which were treated as noted above. Patient had already been referred to radiation oncology by Dr. Liam Redhead.  Patient has his first appointment tomorrow Oct 22, 2023 with Dr. Lurena Sally. I have also ordered an MRI brain with and without contrast to complete staging.  The patient understands he will get a phone call to get this scheduled. He has follow-up again with Dr. Liam Redhead January 19, 2024.  Neither patient or his wife had  any further questions at completion of the office visit.  Patient has had significant weight loss per his wife.  He may benefit from dietitian evaluation through the cancer center.   Test Results: Cytology 10/13/2023 A. LUNG, LUL, ENDOBROCHIAL BRUSHING:  - Non-small cell carcinoma morphologically consistent with squamous cell  carcinoma   B. LUNG, LUL, ENDOBRONCHIAL BIOPSY:  - Non-small cell carcinoma morphologically consistent with squamous cell  carcinoma   C. LUNG, LEFT HILAR MASS, FINE NEEDLE ASPIRATION:  - Non-small cell carcinoma morphologically consistent with squamous cell  carcinoma.   D. LYMPH NODE, 4L, FINE NEEDLE ASPIRATION:  - No malignant cells identified     Latest Ref Rng & Units 10/16/2023    2:01 PM 09/25/2023    1:12 PM 09/03/2023    1:02 PM  CBC  WBC 4.0 - 10.5 K/uL 9.2  8.6  10.7   Hemoglobin 13.0 - 17.0 g/dL 56.3  87.5  64.3   Hematocrit 39.0 - 52.0 % 38.9  37.4  40.2   Platelets 150 - 400 K/uL 238  227  234        Latest Ref Rng & Units 10/16/2023    2:01 PM 09/25/2023    1:12 PM 09/03/2023    1:02 PM  BMP  Glucose 70 - 99 mg/dL 93  329  518   BUN 8 - 23 mg/dL 14  15  16    Creatinine 0.61 - 1.24 mg/dL 8.41  6.60  6.30   Sodium 135 - 145 mmol/L 139  141  139   Potassium 3.5 - 5.1 mmol/L 3.9  3.7  4.0   Chloride 98 - 111 mmol/L 104  107  103   CO2 22 - 32 mmol/L 30  29  30    Calcium  8.9 - 10.3 mg/dL 9.5  9.3  9.9     BNP No results found for: "BNP"  ProBNP No results found for: "PROBNP"  PFT No results found for: "FEV1PRE", "FEV1POST", "FVCPRE", "FVCPOST", "TLC", "DLCOUNC", "PREFEV1FVCRT", "PSTFEV1FVCRT"  No results found.   Past medical hx Past Medical History:  Diagnosis Date   Arthritis    Cancer (HCC)    Diabetes mellitus type II    Glaucoma    sees Dr. Juanito Norma    History of kidney stones    passed   Hx of colonic polyps    Hyperlipidemia    Hypertension    Migraines    Shingles 04/2010   left leg   Subdural hematoma (HCC)  11/13/2008   with small areas of surronding infarct     Social History   Tobacco Use   Smoking status: Never    Passive exposure: Never   Smokeless tobacco: Never  Vaping Use   Vaping status: Never Used  Substance Use Topics   Alcohol use: Yes    Alcohol/week: 1.0 standard drink of alcohol    Types: 1 Standard drinks or equivalent per week    Mr.Maruyama reports that he has never smoked. He has never been exposed to tobacco smoke. He has never used smokeless tobacco. He reports current alcohol use of about 1.0 standard drink of alcohol per week.  Tobacco Cessation: Counseling given: Not Answered Never smoker  Past surgical hx, Family hx, Social hx all reviewed.  Current Outpatient Medications on File Prior to Visit  Medication Sig   acetaminophen  (TYLENOL ) 500 MG tablet Take 1,000 mg by mouth every 8 (eight) hours as needed for moderate pain (pain score 4-6).   amLODipine  (NORVASC ) 10 MG tablet TAKE 1 TABLET BY MOUTH DAILY   aspirin  EC 81 MG tablet Take 1 tablet (81 mg total) by mouth daily. Swallow whole.   atorvastatin  (LIPITOR) 40 MG tablet TAKE 1 TABLET BY MOUTH DAILY   gabapentin  (NEURONTIN ) 300 MG capsule TAKE 2 CAPSULES BY MOUTH 3 TIMES A DAY (Patient taking differently: Take 300 mg by mouth 3 (three) times daily.)   latanoprost  (XALATAN ) 0.005 % ophthalmic solution Place 1 drop into both eyes at bedtime.   LORazepam  (ATIVAN ) 1 MG tablet Take 1 tablet (1 mg total) by mouth daily as needed for anxiety.   losartan  (COZAAR ) 25 MG tablet TAKE 1 TABLET BY MOUTH DAILY   Melatonin 5 MG TABS Take 5 mg by mouth at bedtime.   metFORMIN  (GLUCOPHAGE ) 500 MG tablet TAKE 1 TABLET BY MOUTH TWICE  DAILY WITH A MEAL   Multiple Vitamin (MULTIVITAMIN) tablet Take 1 tablet by mouth daily.   timolol  (BETIMOL ) 0.5 % ophthalmic solution Place 1 drop into both eyes daily.   No current facility-administered medications on file prior to visit.     No Known Allergies  Review Of  Systems:  Constitutional:   +  weight loss, No night sweats,  Fevers, chills, fatigue, or  lassitude.  HEENT:   No headaches,  Difficulty swallowing,  Tooth/dental problems, or  Sore throat,                No sneezing, itching, ear ache, nasal congestion, post nasal drip,   CV:  No chest pain,  Orthopnea,  PND, swelling in lower extremities, anasarca, dizziness, palpitations, syncope.   GI  No heartburn, indigestion, abdominal pain, nausea, vomiting, diarrhea, change in bowel habits, loss of appetite, bloody stools.   Resp: No shortness of breath with exertion or at rest.  No excess mucus, no productive cough,  No non-productive cough,  No coughing up of blood.  No change in color of mucus.  No wheezing.  No chest wall deformity  Skin: no rash or lesions.  GU: no dysuria, change in color of urine, no urgency or frequency.  No flank pain, no hematuria   MS:  No joint pain or swelling.  No decreased range of motion.  No back pain.  Psych:  No change in mood or affect. No depression or anxiety.  No memory loss.   Vital Signs BP 128/62 (BP Location: Left Arm, Patient Position: Sitting, Cuff Size: Normal)   Pulse 83   Ht 5\' 10"  (1.778 m)   Wt 132 lb 6.4 oz (60.1 kg)   SpO2 97%   BMI 19.00 kg/m    Physical Exam:  General- No distress,  A&Ox3, pleasant ENT: No sinus tenderness, TM clear, pale nasal mucosa, no oral exudate,no post nasal drip, no LAN, previous surgery to the left temple Cardiac: S1, S2, regular rate and rhythm, no murmur Chest: No wheeze/ rales/ dullness; no accessory muscle use, no nasal flaring, no sternal retractions, slightly diminished per bases Abd.: Soft Non-tender, nondistended, bowel sounds positive,Body mass index is 19 kg/m.  Ext: No clubbing cyanosis, edema, no obvious deformities Neuro:  normal strength, moving all extremities x 4, alert and oriented x 3 Skin: No rashes, warm and dry, no obvious skin lesions Psych: normal mood and  behavior   Assessment/Plan New diagnosis squamous cell cancer of the lung Suspected metastatic disease from previous squamous cell cancer of the skin and parotid cancer Post bronchoscopy with biopsies Plan I am glad you have done well after the bronchoscopy with biopsies. We have gone over your biopsy results.  They were positive for squamous cell lung cancer in the Left Upper Lobe lung nodule, and the left hilar mass. Dr. Marguerita Shih has referred you to radiation oncology , and you have your first appointment tomorrow 10/22/2023. I have ordered an MRI brain with and without contrast to complete staging. You will get a call to get this scheduled.  We will cancel the appointment with Dr. Byrum for 11/27/2023 at 9 am, as you have already had the biopsy. Call if you need us  for anything. Delynn Fill with your treatment  Please contact office for sooner follow up if symptoms do not improve or worsen or seek emergency care    I spent 30 minutes dedicated to the care of this patient on the date of this encounter to include pre-visit review of records, face-to-face time with the patient discussing conditions above, post visit ordering of testing, clinical documentation with the electronic health record, making appropriate referrals as documented, and communicating necessary information to the patient's healthcare team.     Raejean Bullock, NP 10/21/2023  10:41 AM

## 2023-10-21 NOTE — Patient Instructions (Addendum)
 It is good to see you today.  I am glad you have done well after the bronchoscopy with biopsies. We have gone over your biopsy results.  They were positive for squamous cell lung cancer in the Left Upper Lobe lung nodule, and the left hilar mass. Dr. Marguerita Shih has referred you to radiation oncology , and you have your first appointment tomorrow 10/22/2023. I have ordered an MRI brain with and without contrast to complete staging. You will get a call to get this scheduled.  We will cancel the appointment with Dr. Byrum for 11/27/2023 at 9 am, as you have already had the biopsy. Call if you need us  for anything. Delynn Fill with your treatment  Please contact office for sooner follow up if symptoms do not improve or worsen or seek emergency care

## 2023-10-22 ENCOUNTER — Ambulatory Visit
Admission: RE | Admit: 2023-10-22 | Discharge: 2023-10-22 | Disposition: A | Discharge status: Home / Self Care | Source: Ambulatory Visit | Attending: Radiation Oncology | Admitting: Radiation Oncology

## 2023-10-22 VITALS — BP 142/91 | HR 88 | Temp 97.8°F | Resp 20 | Ht 70.0 in | Wt 129.0 lb

## 2023-10-22 DIAGNOSIS — C7802 Secondary malignant neoplasm of left lung: Secondary | ICD-10-CM | POA: Insufficient documentation

## 2023-10-22 DIAGNOSIS — C3412 Malignant neoplasm of upper lobe, left bronchus or lung: Secondary | ICD-10-CM

## 2023-10-22 DIAGNOSIS — C07 Malignant neoplasm of parotid gland: Secondary | ICD-10-CM | POA: Diagnosis not present

## 2023-10-22 HISTORY — DX: Malignant neoplasm of unspecified part of unspecified bronchus or lung: C34.90

## 2023-10-29 ENCOUNTER — Ambulatory Visit

## 2023-10-29 ENCOUNTER — Ambulatory Visit: Admitting: Radiation Oncology

## 2023-10-29 ENCOUNTER — Ambulatory Visit
Admission: RE | Admit: 2023-10-29 | Discharge: 2023-10-29 | Disposition: A | Discharge status: Home / Self Care | Source: Ambulatory Visit | Attending: Radiation Oncology | Admitting: Radiation Oncology

## 2023-10-29 DIAGNOSIS — Z510 Encounter for antineoplastic radiation therapy: Secondary | ICD-10-CM | POA: Insufficient documentation

## 2023-10-29 DIAGNOSIS — C4492 Squamous cell carcinoma of skin, unspecified: Secondary | ICD-10-CM | POA: Insufficient documentation

## 2023-10-29 DIAGNOSIS — C7802 Secondary malignant neoplasm of left lung: Secondary | ICD-10-CM | POA: Insufficient documentation

## 2023-10-29 DIAGNOSIS — C07 Malignant neoplasm of parotid gland: Secondary | ICD-10-CM | POA: Diagnosis not present

## 2023-11-03 ENCOUNTER — Ambulatory Visit (HOSPITAL_COMMUNITY)
Admission: RE | Admit: 2023-11-03 | Discharge: 2023-11-03 | Disposition: A | Discharge status: Home / Self Care | Source: Ambulatory Visit | Attending: Acute Care | Admitting: Acute Care

## 2023-11-03 DIAGNOSIS — I6782 Cerebral ischemia: Secondary | ICD-10-CM | POA: Diagnosis not present

## 2023-11-03 DIAGNOSIS — C3412 Malignant neoplasm of upper lobe, left bronchus or lung: Secondary | ICD-10-CM | POA: Diagnosis not present

## 2023-11-03 DIAGNOSIS — C3490 Malignant neoplasm of unspecified part of unspecified bronchus or lung: Secondary | ICD-10-CM | POA: Diagnosis not present

## 2023-11-03 DIAGNOSIS — R9089 Other abnormal findings on diagnostic imaging of central nervous system: Secondary | ICD-10-CM | POA: Diagnosis not present

## 2023-11-03 MED ORDER — GADOBUTROL 1 MMOL/ML IV SOLN
6.0000 mL | Freq: Once | INTRAVENOUS | Status: AC | PRN
  Administered 2023-11-03: 6 mL via INTRAVENOUS

## 2023-11-05 DIAGNOSIS — C07 Malignant neoplasm of parotid gland: Secondary | ICD-10-CM | POA: Diagnosis not present

## 2023-11-05 DIAGNOSIS — C4492 Squamous cell carcinoma of skin, unspecified: Secondary | ICD-10-CM | POA: Diagnosis not present

## 2023-11-05 DIAGNOSIS — C7802 Secondary malignant neoplasm of left lung: Secondary | ICD-10-CM | POA: Diagnosis not present

## 2023-11-05 DIAGNOSIS — Z510 Encounter for antineoplastic radiation therapy: Secondary | ICD-10-CM | POA: Diagnosis not present

## 2023-11-11 ENCOUNTER — Ambulatory Visit
Admission: RE | Admit: 2023-11-11 | Discharge: 2023-11-11 | Disposition: A | Discharge status: Home / Self Care | Source: Ambulatory Visit | Attending: Radiation Oncology | Admitting: Radiation Oncology

## 2023-11-11 ENCOUNTER — Other Ambulatory Visit: Payer: Self-pay

## 2023-11-11 DIAGNOSIS — Z510 Encounter for antineoplastic radiation therapy: Secondary | ICD-10-CM | POA: Diagnosis not present

## 2023-11-11 DIAGNOSIS — C7802 Secondary malignant neoplasm of left lung: Secondary | ICD-10-CM | POA: Diagnosis not present

## 2023-11-11 DIAGNOSIS — C07 Malignant neoplasm of parotid gland: Secondary | ICD-10-CM | POA: Diagnosis not present

## 2023-11-11 DIAGNOSIS — C4492 Squamous cell carcinoma of skin, unspecified: Secondary | ICD-10-CM | POA: Diagnosis not present

## 2023-11-11 LAB — RAD ONC ARIA SESSION SUMMARY
Course Elapsed Days: 0
Plan Fractions Treated to Date: 1
Plan Prescribed Dose Per Fraction: 5 Gy
Plan Total Fractions Prescribed: 10
Plan Total Prescribed Dose: 50 Gy
Reference Point Dosage Given to Date: 5 Gy
Reference Point Session Dosage Given: 5 Gy
Session Number: 1

## 2023-11-12 ENCOUNTER — Ambulatory Visit
Admission: RE | Admit: 2023-11-12 | Discharge: 2023-11-12 | Disposition: A | Discharge status: Home / Self Care | Source: Ambulatory Visit | Attending: Radiation Oncology

## 2023-11-12 ENCOUNTER — Other Ambulatory Visit: Payer: Self-pay

## 2023-11-12 DIAGNOSIS — Z510 Encounter for antineoplastic radiation therapy: Secondary | ICD-10-CM | POA: Diagnosis not present

## 2023-11-12 DIAGNOSIS — C7802 Secondary malignant neoplasm of left lung: Secondary | ICD-10-CM | POA: Diagnosis not present

## 2023-11-12 DIAGNOSIS — C07 Malignant neoplasm of parotid gland: Secondary | ICD-10-CM | POA: Diagnosis not present

## 2023-11-12 DIAGNOSIS — C4492 Squamous cell carcinoma of skin, unspecified: Secondary | ICD-10-CM | POA: Diagnosis not present

## 2023-11-12 LAB — RAD ONC ARIA SESSION SUMMARY
Course Elapsed Days: 1
Plan Fractions Treated to Date: 2
Plan Prescribed Dose Per Fraction: 5 Gy
Plan Total Fractions Prescribed: 10
Plan Total Prescribed Dose: 50 Gy
Reference Point Dosage Given to Date: 10 Gy
Reference Point Session Dosage Given: 5 Gy
Session Number: 2

## 2023-11-13 ENCOUNTER — Other Ambulatory Visit: Payer: Self-pay

## 2023-11-13 ENCOUNTER — Ambulatory Visit
Admission: RE | Admit: 2023-11-13 | Discharge: 2023-11-13 | Discharge status: Home / Self Care | Source: Ambulatory Visit | Attending: Radiation Oncology

## 2023-11-13 DIAGNOSIS — C7802 Secondary malignant neoplasm of left lung: Secondary | ICD-10-CM | POA: Diagnosis not present

## 2023-11-13 DIAGNOSIS — C4492 Squamous cell carcinoma of skin, unspecified: Secondary | ICD-10-CM | POA: Diagnosis not present

## 2023-11-13 DIAGNOSIS — C07 Malignant neoplasm of parotid gland: Secondary | ICD-10-CM | POA: Diagnosis not present

## 2023-11-13 DIAGNOSIS — Z510 Encounter for antineoplastic radiation therapy: Secondary | ICD-10-CM | POA: Diagnosis not present

## 2023-11-13 LAB — RAD ONC ARIA SESSION SUMMARY
Course Elapsed Days: 2
Plan Fractions Treated to Date: 3
Plan Prescribed Dose Per Fraction: 5 Gy
Plan Total Fractions Prescribed: 10
Plan Total Prescribed Dose: 50 Gy
Reference Point Dosage Given to Date: 15 Gy
Reference Point Session Dosage Given: 5 Gy
Session Number: 3

## 2023-11-14 ENCOUNTER — Other Ambulatory Visit: Payer: Self-pay

## 2023-11-14 ENCOUNTER — Ambulatory Visit
Admission: RE | Admit: 2023-11-14 | Discharge: 2023-11-14 | Disposition: A | Discharge status: Home / Self Care | Source: Ambulatory Visit | Attending: Radiation Oncology | Admitting: Radiation Oncology

## 2023-11-14 DIAGNOSIS — Z510 Encounter for antineoplastic radiation therapy: Secondary | ICD-10-CM | POA: Diagnosis not present

## 2023-11-14 DIAGNOSIS — C4492 Squamous cell carcinoma of skin, unspecified: Secondary | ICD-10-CM | POA: Diagnosis not present

## 2023-11-14 DIAGNOSIS — C07 Malignant neoplasm of parotid gland: Secondary | ICD-10-CM | POA: Diagnosis not present

## 2023-11-14 DIAGNOSIS — C7802 Secondary malignant neoplasm of left lung: Secondary | ICD-10-CM | POA: Diagnosis not present

## 2023-11-14 LAB — RAD ONC ARIA SESSION SUMMARY
Course Elapsed Days: 3
Plan Fractions Treated to Date: 4
Plan Prescribed Dose Per Fraction: 5 Gy
Plan Total Fractions Prescribed: 10
Plan Total Prescribed Dose: 50 Gy
Reference Point Dosage Given to Date: 20 Gy
Reference Point Session Dosage Given: 5 Gy
Session Number: 4

## 2023-11-17 ENCOUNTER — Other Ambulatory Visit: Payer: Self-pay

## 2023-11-17 ENCOUNTER — Ambulatory Visit

## 2023-11-17 ENCOUNTER — Ambulatory Visit
Admission: RE | Admit: 2023-11-17 | Discharge: 2023-11-17 | Disposition: A | Discharge status: Home / Self Care | Source: Ambulatory Visit | Attending: Radiation Oncology | Admitting: Radiation Oncology

## 2023-11-17 DIAGNOSIS — C7802 Secondary malignant neoplasm of left lung: Secondary | ICD-10-CM | POA: Insufficient documentation

## 2023-11-17 DIAGNOSIS — Z510 Encounter for antineoplastic radiation therapy: Secondary | ICD-10-CM | POA: Diagnosis not present

## 2023-11-17 DIAGNOSIS — C4492 Squamous cell carcinoma of skin, unspecified: Secondary | ICD-10-CM | POA: Diagnosis not present

## 2023-11-17 DIAGNOSIS — C07 Malignant neoplasm of parotid gland: Secondary | ICD-10-CM | POA: Diagnosis not present

## 2023-11-17 LAB — RAD ONC ARIA SESSION SUMMARY
Course Elapsed Days: 6
Plan Fractions Treated to Date: 5
Plan Prescribed Dose Per Fraction: 5 Gy
Plan Total Fractions Prescribed: 10
Plan Total Prescribed Dose: 50 Gy
Reference Point Dosage Given to Date: 25 Gy
Reference Point Session Dosage Given: 5 Gy
Session Number: 5

## 2023-11-18 ENCOUNTER — Other Ambulatory Visit: Payer: Self-pay

## 2023-11-18 ENCOUNTER — Ambulatory Visit
Admission: RE | Admit: 2023-11-18 | Discharge: 2023-11-18 | Disposition: A | Discharge status: Home / Self Care | Source: Ambulatory Visit | Attending: Radiation Oncology | Admitting: Radiation Oncology

## 2023-11-18 ENCOUNTER — Ambulatory Visit

## 2023-11-18 DIAGNOSIS — Z510 Encounter for antineoplastic radiation therapy: Secondary | ICD-10-CM | POA: Diagnosis not present

## 2023-11-18 DIAGNOSIS — C4492 Squamous cell carcinoma of skin, unspecified: Secondary | ICD-10-CM | POA: Diagnosis not present

## 2023-11-18 DIAGNOSIS — C7802 Secondary malignant neoplasm of left lung: Secondary | ICD-10-CM | POA: Diagnosis not present

## 2023-11-18 DIAGNOSIS — C07 Malignant neoplasm of parotid gland: Secondary | ICD-10-CM | POA: Diagnosis not present

## 2023-11-18 LAB — RAD ONC ARIA SESSION SUMMARY
Course Elapsed Days: 7
Plan Fractions Treated to Date: 6
Plan Prescribed Dose Per Fraction: 5 Gy
Plan Total Fractions Prescribed: 10
Plan Total Prescribed Dose: 50 Gy
Reference Point Dosage Given to Date: 30 Gy
Reference Point Session Dosage Given: 5 Gy
Session Number: 6

## 2023-11-19 ENCOUNTER — Other Ambulatory Visit: Payer: Self-pay

## 2023-11-19 ENCOUNTER — Ambulatory Visit
Admission: RE | Admit: 2023-11-19 | Discharge: 2023-11-19 | Discharge status: Home / Self Care | Source: Ambulatory Visit | Attending: Radiation Oncology

## 2023-11-19 DIAGNOSIS — C07 Malignant neoplasm of parotid gland: Secondary | ICD-10-CM | POA: Diagnosis not present

## 2023-11-19 DIAGNOSIS — C4492 Squamous cell carcinoma of skin, unspecified: Secondary | ICD-10-CM | POA: Diagnosis not present

## 2023-11-19 DIAGNOSIS — C7802 Secondary malignant neoplasm of left lung: Secondary | ICD-10-CM | POA: Diagnosis not present

## 2023-11-19 DIAGNOSIS — Z510 Encounter for antineoplastic radiation therapy: Secondary | ICD-10-CM | POA: Diagnosis not present

## 2023-11-19 LAB — RAD ONC ARIA SESSION SUMMARY
Course Elapsed Days: 8
Plan Fractions Treated to Date: 7
Plan Prescribed Dose Per Fraction: 5 Gy
Plan Total Fractions Prescribed: 10
Plan Total Prescribed Dose: 50 Gy
Reference Point Dosage Given to Date: 35 Gy
Reference Point Session Dosage Given: 5 Gy
Session Number: 7

## 2023-11-20 ENCOUNTER — Ambulatory Visit
Admission: RE | Admit: 2023-11-20 | Discharge: 2023-11-20 | Disposition: A | Discharge status: Home / Self Care | Source: Ambulatory Visit | Attending: Radiation Oncology

## 2023-11-20 ENCOUNTER — Other Ambulatory Visit: Payer: Self-pay

## 2023-11-20 DIAGNOSIS — Z510 Encounter for antineoplastic radiation therapy: Secondary | ICD-10-CM | POA: Diagnosis not present

## 2023-11-20 DIAGNOSIS — C7802 Secondary malignant neoplasm of left lung: Secondary | ICD-10-CM | POA: Diagnosis not present

## 2023-11-20 DIAGNOSIS — C07 Malignant neoplasm of parotid gland: Secondary | ICD-10-CM | POA: Diagnosis not present

## 2023-11-20 DIAGNOSIS — C4492 Squamous cell carcinoma of skin, unspecified: Secondary | ICD-10-CM | POA: Diagnosis not present

## 2023-11-20 LAB — RAD ONC ARIA SESSION SUMMARY
Course Elapsed Days: 9
Plan Fractions Treated to Date: 8
Plan Prescribed Dose Per Fraction: 5 Gy
Plan Total Fractions Prescribed: 10
Plan Total Prescribed Dose: 50 Gy
Reference Point Dosage Given to Date: 40 Gy
Reference Point Session Dosage Given: 5 Gy
Session Number: 8

## 2023-11-21 ENCOUNTER — Other Ambulatory Visit: Payer: Self-pay

## 2023-11-21 ENCOUNTER — Ambulatory Visit
Admission: RE | Admit: 2023-11-21 | Discharge: 2023-11-21 | Disposition: A | Discharge status: Home / Self Care | Source: Ambulatory Visit | Attending: Radiation Oncology | Admitting: Radiation Oncology

## 2023-11-21 DIAGNOSIS — Z510 Encounter for antineoplastic radiation therapy: Secondary | ICD-10-CM | POA: Diagnosis not present

## 2023-11-21 DIAGNOSIS — C4492 Squamous cell carcinoma of skin, unspecified: Secondary | ICD-10-CM | POA: Diagnosis not present

## 2023-11-21 DIAGNOSIS — C7802 Secondary malignant neoplasm of left lung: Secondary | ICD-10-CM | POA: Diagnosis not present

## 2023-11-21 DIAGNOSIS — C07 Malignant neoplasm of parotid gland: Secondary | ICD-10-CM | POA: Diagnosis not present

## 2023-11-21 LAB — RAD ONC ARIA SESSION SUMMARY
Course Elapsed Days: 10
Plan Fractions Treated to Date: 9
Plan Prescribed Dose Per Fraction: 5 Gy
Plan Total Fractions Prescribed: 10
Plan Total Prescribed Dose: 50 Gy
Reference Point Dosage Given to Date: 45 Gy
Reference Point Session Dosage Given: 5 Gy
Session Number: 9

## 2023-11-24 ENCOUNTER — Ambulatory Visit
Admission: RE | Admit: 2023-11-24 | Discharge: 2023-11-24 | Disposition: A | Discharge status: Home / Self Care | Source: Ambulatory Visit | Attending: Radiation Oncology | Admitting: Radiation Oncology

## 2023-11-24 ENCOUNTER — Other Ambulatory Visit: Payer: Self-pay

## 2023-11-24 DIAGNOSIS — C7802 Secondary malignant neoplasm of left lung: Secondary | ICD-10-CM | POA: Diagnosis not present

## 2023-11-24 DIAGNOSIS — Z510 Encounter for antineoplastic radiation therapy: Secondary | ICD-10-CM | POA: Diagnosis not present

## 2023-11-24 DIAGNOSIS — C07 Malignant neoplasm of parotid gland: Secondary | ICD-10-CM | POA: Diagnosis not present

## 2023-11-24 DIAGNOSIS — C4492 Squamous cell carcinoma of skin, unspecified: Secondary | ICD-10-CM | POA: Diagnosis not present

## 2023-11-24 LAB — RAD ONC ARIA SESSION SUMMARY
Course Elapsed Days: 13
Plan Fractions Treated to Date: 10
Plan Prescribed Dose Per Fraction: 5 Gy
Plan Total Fractions Prescribed: 10
Plan Total Prescribed Dose: 50 Gy
Reference Point Dosage Given to Date: 50 Gy
Reference Point Session Dosage Given: 5 Gy
Session Number: 10

## 2023-11-25 ENCOUNTER — Other Ambulatory Visit: Payer: Self-pay

## 2023-11-25 NOTE — Radiation Completion Notes (Signed)
 Patient Name: Shaun Mcmillan, Shaun Mcmillan MRN: [id number] Date of Birth: March 22, 1941 Referring Physician: Marlene Simas, M.D. Date of Service: 2023-11-25 Radiation Oncologist: Colie Dawes, M.D. [facility] [place]                             RADIATION ONCOLOGY END OF TREATMENT NOTE     Diagnosis: C07 Malignant neoplasm of parotid gland Intent: Curative     ==========DELIVERED PLANS==========  First Treatment Date: 2023-11-11 Last Treatment Date: 2023-11-24   Plan Name: Lung_L_UHRT Site: Lung, Left Technique: IMRT Mode: Photon Dose Per Fraction: 5 Gy Prescribed Dose (Delivered / Prescribed): 50 Gy / 50 Gy Prescribed Fxs (Delivered / Prescribed): 10 / 10     ==========ON TREATMENT VISIT DATES========== 2023-11-17, 2023-11-24     ==========UPCOMING VISITS========== 02/17/2024 [facility] WELL VISIT, SEQUENTIAL Maurie Southern, DO  01/19/2024 [facility] ONCOLOGY EST PT 15 Marlene Simas, MD  01/12/2024 [facility] IMAGING CT NECK/CHEST W & W/O [facility]-CT 1  01/12/2024 [facility] ONCOL[facility] LAB [facility] LAB  01/01/2024 [facility]RADIATION ONC FOLLOW UP 20 Colie Dawes, MD        ==========APPENDIX - ON TREATMENT VISIT NOTES==========   See weekly On Treatment Notes in Epic for details in the Media tab (listed as Progress notes on the On Treatment Visit Dates listed above).

## 2023-11-27 ENCOUNTER — Ambulatory Visit: Admitting: Emergency Medicine

## 2023-11-27 IMAGING — CT CT NECK W/ CM
5 series · 16 of 33 positions shown, 18 images · IV contrast (OMNIPAQUE)
Comparison: 03/19/2021

CLINICAL DATA: Head/neck cancer, monitor; parotid squamous cell
post surgery and radiation

EXAM:
CT NECK WITH CONTRAST
TECHNIQUE: Multidetector CT imaging of the neck was performed using the
standard protocol following the bolus administration of intravenous
contrast.

[Series 2: axial neck · axial · 0.43mm/px · z∈[-40,+40]mm · 2 of 121 slices shown]
[im 41/121  bone]
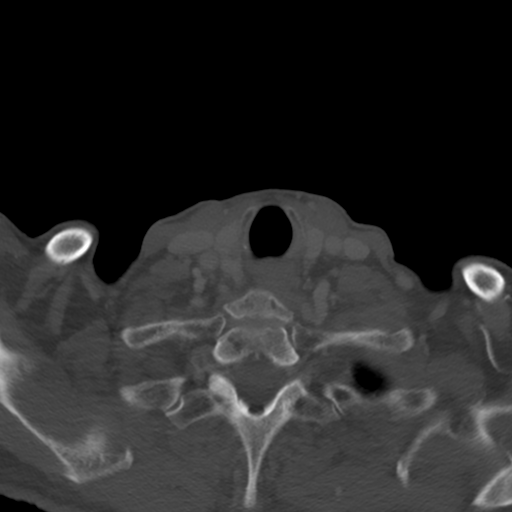
[im 81/121  bone]
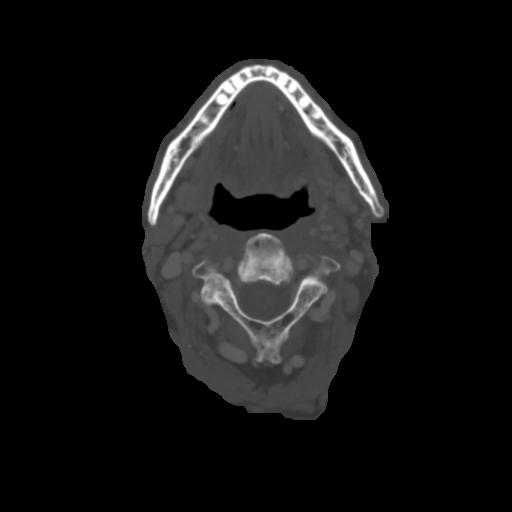

[Series 4: axial bone · axial · 0.43mm/px · z∈[-60,+60]mm · 3 of 121 slices shown, 4 images]
[im 31/121  soft-tissue]
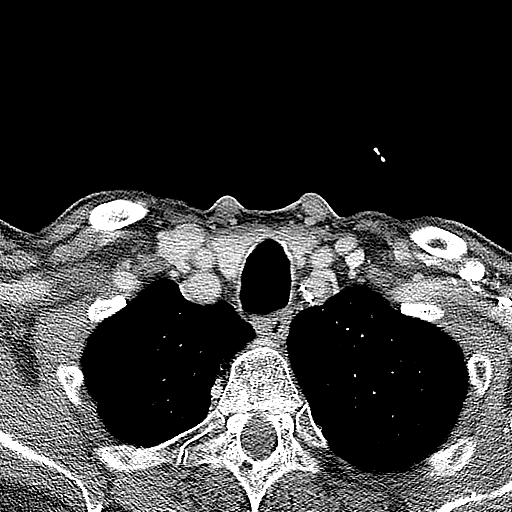
[im 31/121  bone]
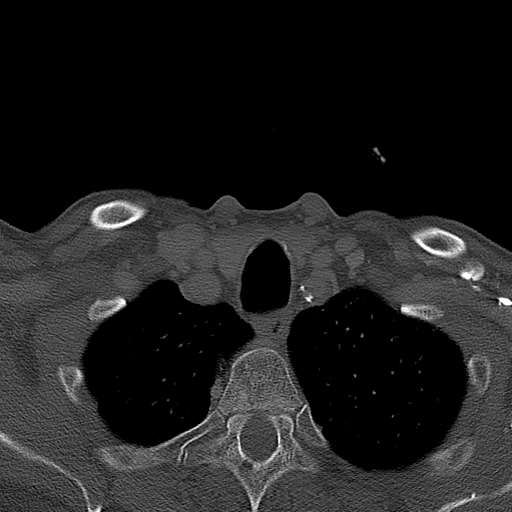
[im 61/121  bone]
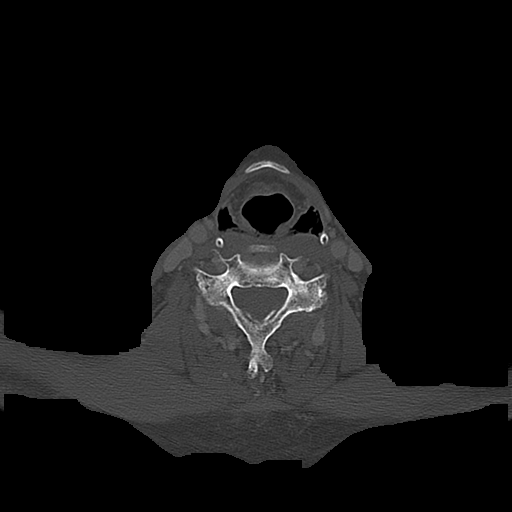
[im 91/121  bone]
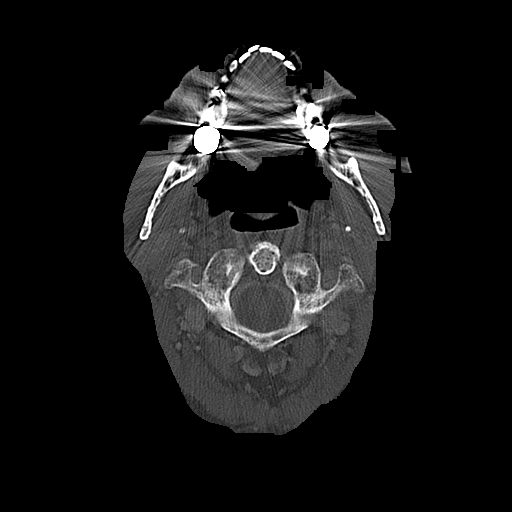

[Series 5: orthogonal (person_name) · axial · 0.39mm/px · z∈[-86,+42]mm · 3 of 132 slices shown]
[im 33/132  bone]
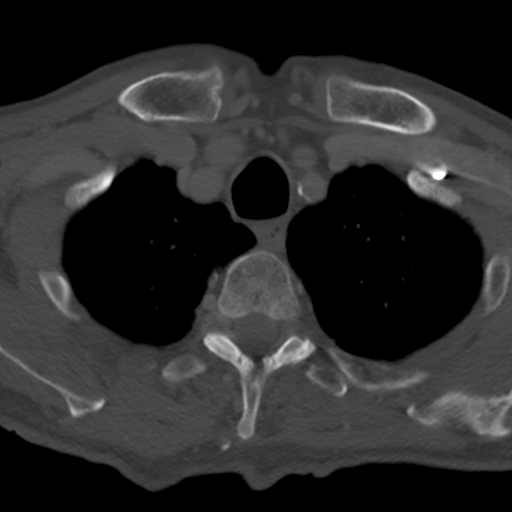
[im 66/132  bone]
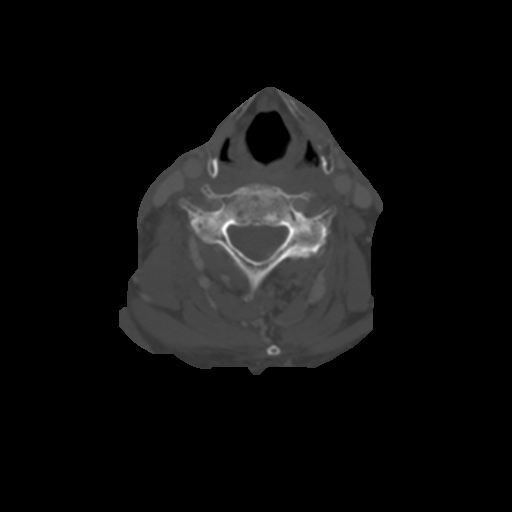
[im 99/132  bone]
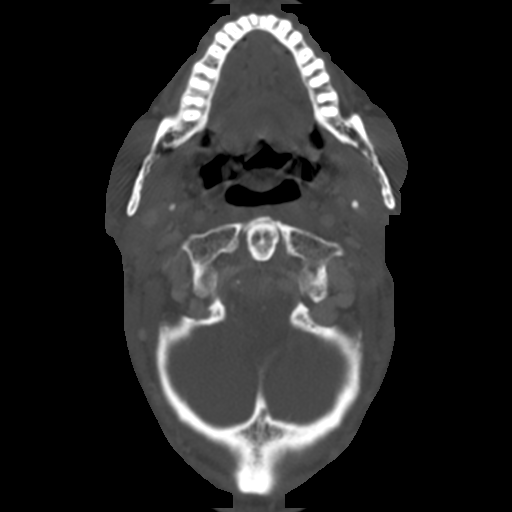

[Series 6: cor neck · coronal · 0.54mm/px · 3 of 97 slices shown]
[im 20/97  bone]
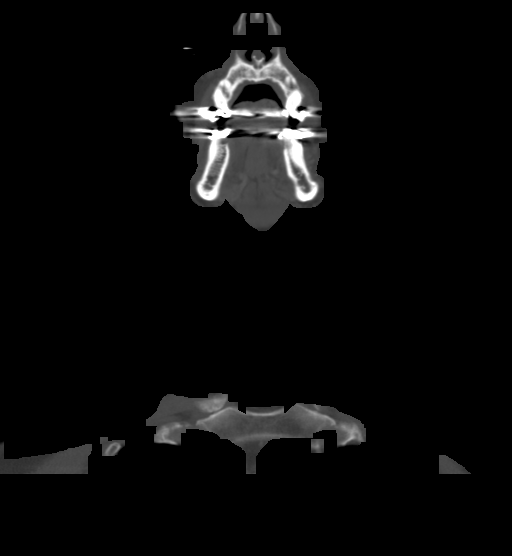
[im 39/97  bone]
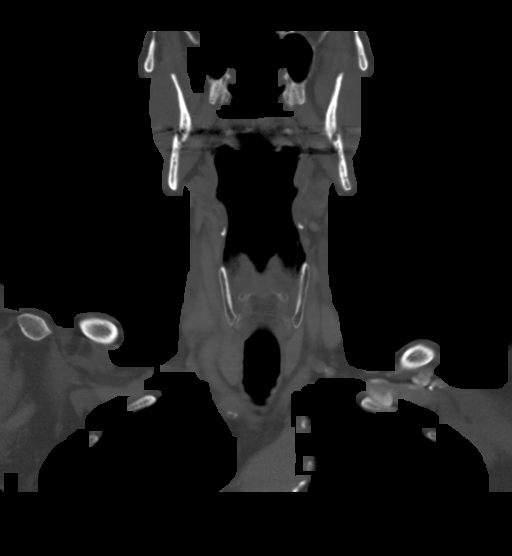
[im 58/97  bone]
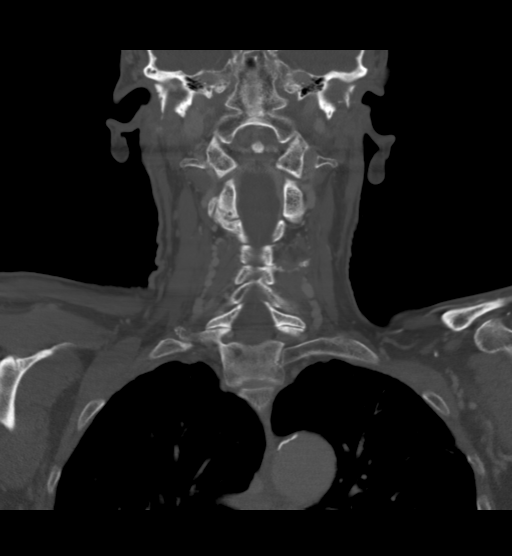

[Series 7: sag neck · sagittal · 0.53mm/px · 5 of 101 slices shown, 6 images]
[im 34/101  bone]
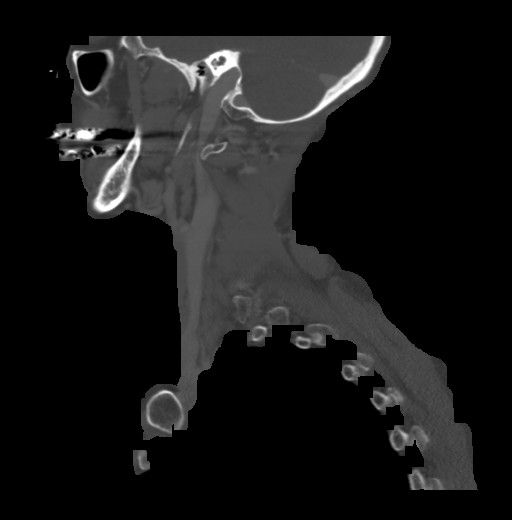
[im 42/101  bone]
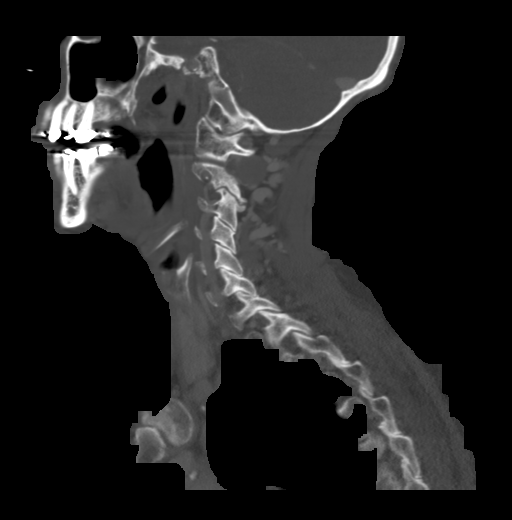
[im 51/101  soft-tissue]
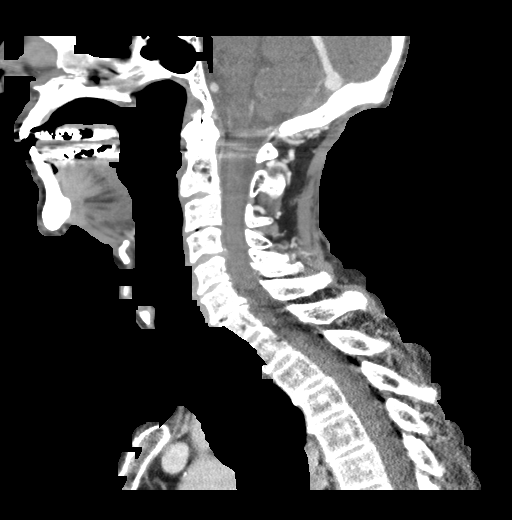
[im 51/101  bone]
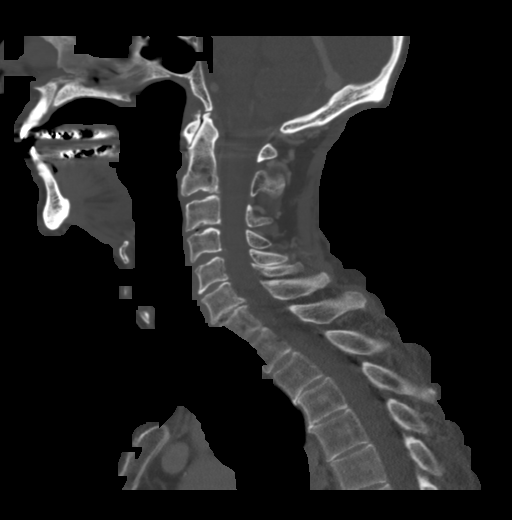
[im 59/101  bone]
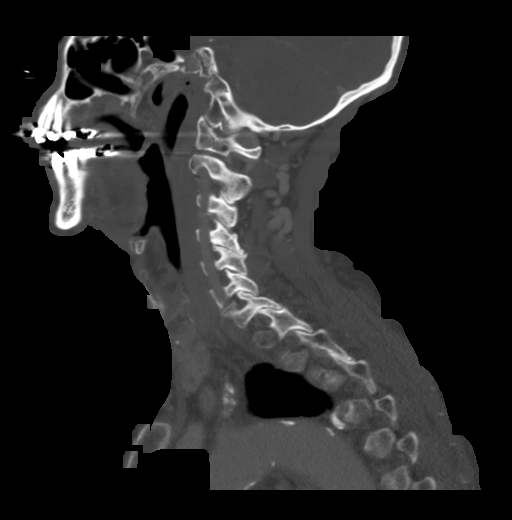
[im 67/101  bone]
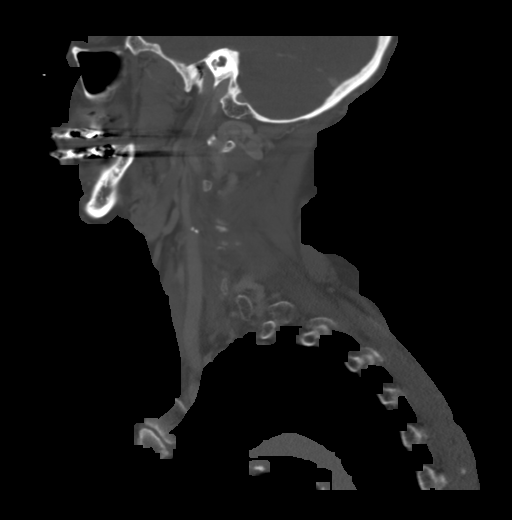

[16 of 33 positions shown; findings below may reference images not displayed]

RADIATION DOSE REDUCTION: This exam was performed according to the
departmental dose-optimization program which includes automated
exposure control, adjustment of the mA and/or kV according to
patient size and/or use of iterative reconstruction technique.

CONTRAST:  75mL OMNIPAQUE IOHEXOL 300 MG/ML  SOLN
FINDINGS: Pharynx and larynx: Unremarkable.  No mass.

Salivary glands: Post significant resection of the left parotid.
Right parotid and submandibular glands are unremarkable.

Thyroid: Inferior left thyroid lobe nodule is not as well seen.

Lymph nodes: No new or enlarged nodes.

Vascular: Major neck vessels are patent. Similar calcification at
the common carotid bifurcations.

Limited intracranial: No abnormal enhancement.

Visualized orbits: Minimally included.

Mastoids and visualized paranasal sinuses: No significant
opacification.

Skeleton: Similar appearance of cervical spine degenerative changes.

Upper chest: Dictated separately.

Other: None.
IMPRESSION: No evidence of recurrent disease.

## 2023-12-21 ENCOUNTER — Other Ambulatory Visit: Payer: Self-pay | Admitting: Family Medicine

## 2023-12-24 DIAGNOSIS — H401112 Primary open-angle glaucoma, right eye, moderate stage: Secondary | ICD-10-CM | POA: Diagnosis not present

## 2023-12-24 DIAGNOSIS — H401123 Primary open-angle glaucoma, left eye, severe stage: Secondary | ICD-10-CM | POA: Diagnosis not present

## 2023-12-31 NOTE — Progress Notes (Signed)
 [name] presents for a telephone follow up. He completed radiation treatment on 11/24/2023. He is doing well post radiation treatment.   Pain issues, if any: Patient denies Using a feeding tube?: None Weight changes, if any: Patient lost 50 pounds during the course of radiation treatment. Encouraged to eat high calorie, high protein food. Swallowing issues, if any: None Smoking or chewing tobacco? None Using fluoride toothpaste daily? Yes Last ENT visit was on: N/A Other notable issues, if any:  Patient had a cough during radiation treatment and has subsided once his treatment was over.

## 2024-01-01 ENCOUNTER — Ambulatory Visit
Admission: RE | Admit: 2024-01-01 | Discharge: 2024-01-01 | Disposition: A | Discharge status: Home / Self Care | Source: Ambulatory Visit | Attending: Radiation Oncology | Admitting: Radiation Oncology

## 2024-01-01 DIAGNOSIS — C07 Malignant neoplasm of parotid gland: Secondary | ICD-10-CM

## 2024-01-05 DIAGNOSIS — L570 Actinic keratosis: Secondary | ICD-10-CM | POA: Diagnosis not present

## 2024-01-05 DIAGNOSIS — L905 Scar conditions and fibrosis of skin: Secondary | ICD-10-CM | POA: Diagnosis not present

## 2024-01-05 DIAGNOSIS — L821 Other seborrheic keratosis: Secondary | ICD-10-CM | POA: Diagnosis not present

## 2024-01-05 DIAGNOSIS — Z85828 Personal history of other malignant neoplasm of skin: Secondary | ICD-10-CM | POA: Diagnosis not present

## 2024-01-05 DIAGNOSIS — L814 Other melanin hyperpigmentation: Secondary | ICD-10-CM | POA: Diagnosis not present

## 2024-01-12 ENCOUNTER — Inpatient Hospital Stay: Attending: Internal Medicine

## 2024-01-12 ENCOUNTER — Ambulatory Visit (HOSPITAL_COMMUNITY)
Admission: RE | Admit: 2024-01-12 | Discharge: 2024-01-12 | Disposition: A | Discharge status: Home / Self Care | Source: Ambulatory Visit | Attending: Physician Assistant | Admitting: Physician Assistant

## 2024-01-12 DIAGNOSIS — Z85118 Personal history of other malignant neoplasm of bronchus and lung: Secondary | ICD-10-CM | POA: Diagnosis not present

## 2024-01-12 DIAGNOSIS — C7800 Secondary malignant neoplasm of unspecified lung: Secondary | ICD-10-CM | POA: Insufficient documentation

## 2024-01-12 DIAGNOSIS — Z85828 Personal history of other malignant neoplasm of skin: Secondary | ICD-10-CM | POA: Insufficient documentation

## 2024-01-12 DIAGNOSIS — I700 Atherosclerosis of aorta: Secondary | ICD-10-CM | POA: Diagnosis not present

## 2024-01-12 DIAGNOSIS — R911 Solitary pulmonary nodule: Secondary | ICD-10-CM | POA: Diagnosis not present

## 2024-01-12 LAB — CBC WITH DIFFERENTIAL (CANCER CENTER ONLY)
Abs Immature Granulocytes: 0.02 K/uL (ref 0.00–0.07)
Basophils Absolute: 0 K/uL (ref 0.0–0.1)
Basophils Relative: 0 %
Eosinophils Absolute: 0.1 K/uL (ref 0.0–0.5)
Eosinophils Relative: 1 %
HCT: 38.2 % — ABNORMAL LOW (ref 39.0–52.0)
Hemoglobin: 12.7 g/dL — ABNORMAL LOW (ref 13.0–17.0)
Immature Granulocytes: 0 %
Lymphocytes Relative: 14 %
Lymphs Abs: 0.9 K/uL (ref 0.7–4.0)
MCH: 28.3 pg (ref 26.0–34.0)
MCHC: 33.2 g/dL (ref 30.0–36.0)
MCV: 85.3 fL (ref 80.0–100.0)
Monocytes Absolute: 0.4 K/uL (ref 0.1–1.0)
Monocytes Relative: 6 %
Neutro Abs: 5 K/uL (ref 1.7–7.7)
Neutrophils Relative %: 79 %
Platelet Count: 200 K/uL (ref 150–400)
RBC: 4.48 MIL/uL (ref 4.22–5.81)
RDW: 14.4 % (ref 11.5–15.5)
WBC Count: 6.3 K/uL (ref 4.0–10.5)
nRBC: 0 % (ref 0.0–0.2)

## 2024-01-12 LAB — CMP (CANCER CENTER ONLY)
ALT: 15 U/L (ref 0–44)
AST: 17 U/L (ref 15–41)
Albumin: 4 g/dL (ref 3.5–5.0)
Alkaline Phosphatase: 89 U/L (ref 38–126)
Anion gap: 6 (ref 5–15)
BUN: 25 mg/dL — ABNORMAL HIGH (ref 8–23)
CO2: 28 mmol/L (ref 22–32)
Calcium: 9.3 mg/dL (ref 8.9–10.3)
Chloride: 105 mmol/L (ref 98–111)
Creatinine: 1.01 mg/dL (ref 0.61–1.24)
GFR, Estimated: >60 mL/min (ref >60)
Glucose, Bld: 222 mg/dL — ABNORMAL HIGH (ref 70–99)
Potassium: 3.8 mmol/L (ref 3.5–5.1)
Sodium: 139 mmol/L (ref 135–145)
Total Bilirubin: 0.7 mg/dL (ref 0.0–1.2)
Total Protein: 6.8 g/dL (ref 6.5–8.1)

## 2024-01-12 MED ORDER — IOHEXOL 300 MG/ML  SOLN
75.0000 mL | Freq: Once | INTRAMUSCULAR | Status: AC | PRN
  Administered 2024-01-12: 75 mL via INTRAVENOUS

## 2024-01-13 ENCOUNTER — Other Ambulatory Visit: Payer: Self-pay

## 2024-01-19 ENCOUNTER — Inpatient Hospital Stay: Attending: Internal Medicine | Admitting: Internal Medicine

## 2024-01-19 VITALS — BP 134/68 | HR 66 | Temp 98.3°F | Resp 17 | Ht 70.0 in | Wt 130.0 lb

## 2024-01-19 DIAGNOSIS — C7800 Secondary malignant neoplasm of unspecified lung: Secondary | ICD-10-CM

## 2024-01-19 DIAGNOSIS — Z923 Personal history of irradiation: Secondary | ICD-10-CM | POA: Diagnosis not present

## 2024-01-19 DIAGNOSIS — Z85828 Personal history of other malignant neoplasm of skin: Secondary | ICD-10-CM | POA: Diagnosis not present

## 2024-01-19 NOTE — Progress Notes (Signed)
 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 He initially presented with skin cancer around the parotid gland in April 2022.   PRIOR THERAPY: 1) status post left parotidectomy with skin resection and rotational flap completed by Dr. Jesus in April 2022. Final pathology showed squamous cell carcinoma.  2) status post radiation therapy under the care of Dr. Izell completing 33 fractions on December 19, 2020 for a total of 66 Gray  3) Libtayo  (Cempilimab) 350 Mg IV every 3 weeks started August 10, 2021. Status post 35 cycles of treatment.  4) Ultra hypofractionated radiotherapy to the left hilar mass under the care of Dr. Izell  CURRENT THERAPY: Observation.  INTERVAL HISTORY: Gilles Trimpe 83 y.o. male returns to the clinic today for follow-up visit accompanied by his wife Glendale.Discussed the use of AI scribe software for clinical note transcription with the patient, who gave verbal consent to proceed.  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 He is accompanied by his wife, Glendale.  He was diagnosed with stage four squamous cell carcinoma of the skin with pulmonary involvement in 2023. He underwent a left parotidectomy with skin resection followed by two years of treatment with liposomal chemotherapy at a dose of 350 mg IV every three weeks. Additionally, he received ultra hypofractionated radiotherapy to the left hilar soft tissue mass. No pain and he handled the radiation treatment well.  He sees a dermatologist every three months for dark spots in the left  parotid gland skin. These spots have been present for a long time. Previously, another dermatologist would scrape these spots off during visits.  His weight is currently 130 pounds, consistent with his weight from May 2025, when he weighed 132 pounds. No weight loss, night sweats, or other complaints. He humorously notes that the scales at the doctor's office always show ten pounds more than his home scale, attributing this to clothing and other factors.    MEDICAL HISTORY: Past Medical History:  Diagnosis Date   Arthritis    Cancer (HCC)    Diabetes mellitus type II    Glaucoma    sees Dr. 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  MEDICATIONS:  Current Outpatient Medications  Medication Sig Dispense Refill   acetaminophen  (TYLENOL ) 500 MG tablet Take 1,000 mg by mouth every 8 (eight) hours as needed for moderate pain (pain score 4-6).     amLODipine  (NORVASC ) 10 MG tablet TAKE 1 TABLET BY MOUTH DAILY 100 tablet 2   aspirin  EC 81 MG tablet Take 1 tablet (81 mg total) by mouth daily. Swallow whole. 30 tablet 11   atorvastatin  (LIPITOR) 40 MG tablet TAKE 1 TABLET BY MOUTH DAILY 100 tablet 0   gabapentin  (NEURONTIN ) 300 MG capsule  TAKE 2 CAPSULES BY MOUTH 3 TIMES A DAY (Patient taking differently: Take 300 mg by mouth 3 (three) times daily.) 180 capsule 1   latanoprost  (XALATAN ) 0.005 % ophthalmic solution Place 1 drop into both eyes at bedtime.     LORazepam  (ATIVAN ) 1 MG tablet Take 1 tablet (1 mg total) by mouth daily as needed for anxiety.     losartan  (COZAAR ) 25 MG tablet TAKE 1 TABLET BY MOUTH DAILY 100 tablet 0   Melatonin 5 MG TABS Take 5 mg by mouth at bedtime.     metFORMIN  (GLUCOPHAGE ) 500 MG tablet TAKE 1 TABLET BY MOUTH TWICE   DAILY WITH A MEAL 200 tablet 0   Multiple Vitamin (MULTIVITAMIN) tablet Take 1 tablet by mouth daily.     timolol  (BETIMOL ) 0.5 % ophthalmic solution Place 1 drop into both eyes daily.     No current facility-administered medications for this visit.    SURGICAL HISTORY:  Past Surgical History:  Procedure Laterality Date   APPENDECTOMY     BRONCHIAL BIOPSY  10/13/2023   Procedure: BRONCHOSCOPY, WITH BIOPSY;  Surgeon: Shelah Lamar RAMAN, MD;  Location: [facility] ENDOSCOPY;  Service: Pulmonary;;   BRONCHIAL BRUSHINGS  10/13/2023   Procedure: BRONCHOSCOPY, WITH BRUSH BIOPSY;  Surgeon: Shelah Lamar RAMAN, MD;  Location: [facility] ENDOSCOPY;  Service: Pulmonary;;   BRONCHIAL NEEDLE ASPIRATION BIOPSY  10/13/2023   Procedure: BRONCHOSCOPY, WITH NEEDLE ASPIRATION BIOPSY;  Surgeon: Shelah Lamar RAMAN, MD;  Location: [facility] ENDOSCOPY;  Service: Pulmonary;;   colonscopy  03/12/2016   per Dr. Rosalie, benign polyps, repeat in 5 yrs   EYE SURGERY Bilateral    cataracts   left craniotomy  11/03/2008   per Dr. Alm Molt for a subdrual hematoma   PAROTIDECTOMY Left 09/20/2020   Procedure: PAROTIDECTOMY with resection of skin and rotational flap;  Surgeon: Jesus Oliphant, MD;  Location: [facility] OR;  Service: ENT;  Laterality: Left;   surgery for ocmpound fracture     right leg   VIDEO BRONCHOSCOPY WITH ENDOBRONCHIAL ULTRASOUND N/A 10/13/2023   Procedure: BRONCHOSCOPY, WITH EBUS;  Surgeon: Shelah Lamar RAMAN, MD;  Location[facility] ENDOSCOPY;  Service: Pulmonary;  Laterality: N/A;  with fluoro    REVIEW OF SYSTEMS:  Constitutional: negative Eyes: negative Ears, nose, mouth, throat, and face: negative Respiratory: negative Cardiovascular: negative Gastrointestinal: negative Genitourinary:negative Integument/breast: negative Hematologic/lymphatic: negative Musculoskeletal:negative Neurological: positive for paresthesia Behavioral/Psych: negative Endocrine: negative Allergic/Immunologic: negative   PHYSICAL EXAMINATION: General  appearance: alert, cooperative, and no distress Head: Normocephalic, without obvious abnormality, atraumatic Neck: no adenopathy, no JVD, supple, symmetrical, trachea midline, and thyroid  not enlarged, symmetric, no tenderness/mass/nodules Lymph nodes: Cervical, supraclavicular, and axillary nodes normal. Resp: clear to auscultation bilaterally Back: symmetric, no curvature. ROM normal. No CVA tenderness. Cardio: regular rate and rhythm, S1, S2 normal, no murmur, click, rub or gallop GI: soft, non-tender; bowel sounds normal; no masses,  no organomegaly Extremities: extremities normal, atraumatic, no cyanosis or edema Neurologic: Alert and oriented X 3, normal strength and tone. Normal symmetric reflexes. Normal coordination and gait  ECOG PERFORMANCE STATUS: 1 - Symptomatic but completely ambulatory  Blood pressure 134/68, pulse 66, temperature 98.3 F (36.8 C), temperature source Temporal, resp. rate 17, height 5' 10 (1.778 m), weight 130 lb (59 kg), SpO2 100%.  LABORATORY DATA: Lab Results  Component Value Date   WBC 6.3 01/12/2024   HGB 12.7 (L) 01/12/2024   HCT 38.2 (L) 01/12/2024   MCV 85.3 01/12/2024   PLT 200 01/12/2024      Chemistry  Component Value Date/Time   NA 139 01/12/2024 1302   K 3.8 01/12/2024 1302   CL 105 01/12/2024 1302   CO2 28 01/12/2024 1302   BUN 25 (H) 01/12/2024 1302   CREATININE 1.01 01/12/2024 1302   CREATININE 0.69 (L) 01/08/2021 1553      Component Value Date/Time   CALCIUM  9.3 01/12/2024 1302   ALKPHOS 89 01/12/2024 1302   AST 17 01/12/2024 1302   ALT 15 01/12/2024 1302   BILITOT 0.7 01/12/2024 1302       RADIOGRAPHIC STUDIES: CT Soft Tissue Neck W Contrast Result Date: 01/12/2024 CLINICAL DATA:  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 RADIATION DOSE REDUCTION: This exam was performed according to the departmental dose-optimization program which includes automated exposure control, adjustment of the mA and/or kV according to patient size and/or use of iterative reconstruction technique. CONTRAST:  75 mL Omnipaque  300 iodinated contrast IV COMPARISON:  PET-CT, 09/19/2023, CT neck and chest, 09/03/2023 FINDINGS: CT NECK FINDINGS Pharynx and larynx: Normal. No mass or swelling. Salivary glands: Unchanged postoperative appearance status post left parotidectomy with associated surgical clips (series 2, image 39). No suspicious soft tissue or contrast enhancement in the resection bed. No inflammation, mass, or stone. Thyroid : Normal. Lymph nodes: None enlarged or abnormal density. Vascular: Carotid bulb atherosclerosis. Limited intracranial: Negative. Visualized orbits: Negative. Mastoids and visualized paranasal sinuses: Clear. Skeleton: No acute or aggressive process. Mild multilevel cervical disc degenerative disease. CT CHEST FINDINGS Cardiovascular: Aortic atherosclerosis. Normal heart size. Three-vessel coronary artery calcifications. No pericardial effusion. Mediastinum/Nodes: Complete resolution of previously seen left hilar lymphadenopathy seen obstructing the lingular bronchus on prior examination (series 5, image 74). No persistently enlarged mediastinal, hilar, or axillary lymph nodes. Thyroid  gland, trachea, and esophagus demonstrate no significant findings. Lungs/Pleura: Unchanged 0.5 cm nodule of the lingula (series 5, image 74). Bandlike scarring of the dependent bilateral lung bases. No pleural effusion or pneumothorax. Upper Abdomen: No acute abnormality. Musculoskeletal: No chest wall abnormality. No acute osseous findings. IMPRESSION: 1. Unchanged postoperative appearance status post left parotidectomy. No suspicious soft tissue or contrast enhancement in the resection bed. 2. Complete  resolution of previously seen left hilar lymphadenopathy seen obstructing the lingular bronchus on prior examination. No persistently enlarged lymph nodes in the neck or chest. 3. Unchanged 0.5 cm nodule of the lingula.  Attention on follow-up 4. Coronary artery disease. Aortic Atherosclerosis (ICD10-I70.0). Electronically Signed   By: Marolyn JONETTA Jaksch M.D.   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 RADIATION DOSE REDUCTION: This exam was performed according to the departmental dose-optimization program which includes automated exposure control, adjustment of the mA and/or kV according to patient size and/or use of iterative reconstruction technique. CONTRAST:  75 mL Omnipaque  300 iodinated contrast IV COMPARISON:  PET-CT, 09/19/2023, CT neck and chest, 09/03/2023 FINDINGS: CT NECK FINDINGS Pharynx and larynx: Normal. No mass or swelling. Salivary glands: Unchanged postoperative appearance status post left parotidectomy with associated surgical clips (series 2, image 39). No suspicious soft tissue or contrast enhancement in  the resection bed. No inflammation, mass, or stone. Thyroid : Normal. Lymph nodes: None enlarged or abnormal density. Vascular: Carotid bulb atherosclerosis. Limited intracranial: Negative. Visualized orbits: Negative. Mastoids and visualized paranasal sinuses: Clear. Skeleton: No acute or aggressive process. Mild multilevel cervical disc degenerative disease. CT CHEST FINDINGS Cardiovascular: Aortic atherosclerosis. Normal heart size. Three-vessel coronary artery calcifications. No pericardial effusion. Mediastinum/Nodes: Complete resolution of previously seen left hilar  lymphadenopathy seen obstructing the lingular bronchus on prior examination (series 5, image 74). No persistently enlarged mediastinal, hilar, or axillary lymph nodes. Thyroid  gland, trachea, and esophagus demonstrate no significant findings. Lungs/Pleura: Unchanged 0.5 cm nodule of the lingula (series 5, image 74). Bandlike scarring of the dependent bilateral lung bases. No pleural effusion or pneumothorax. Upper Abdomen: No acute abnormality. Musculoskeletal: No chest wall abnormality. No acute osseous findings. IMPRESSION: 1. Unchanged postoperative appearance status post left parotidectomy. No suspicious soft tissue or contrast enhancement in the resection bed. 2. Complete resolution of previously seen left hilar lymphadenopathy seen obstructing the lingular bronchus on prior examination. No persistently enlarged lymph nodes in the neck or chest. 3. Unchanged 0.5 cm nodule of the lingula.  Attention on follow-up 4. Coronary artery disease. Aortic Atherosclerosis (ICD10-I70.0). Electronically Signed   By: Marolyn JONETTA Jaksch M.D.   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  He had evidence for disease progression and metastasis to the lung in February 2023. The patient is currently undergoing treatment with immunotherapy with Libtayo  (Cempilimab) 350 Mg IV every 3 weeks status post 35 cycles.  The patient has been tolerating this treatment fairly well with no concerning adverse effects. He also underwent ultra hypofractionated radiotherapy to a left hilar mass under the care of Dr. Izell. He is currently on observation and feeling fine. He had repeat CT scan of the neck and chest that showed complete resolution  of the previously seen left hilar lymphadenopathy with no new findings for disease recurrence or metastasis. 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 Status post left parotidectomy with skin resection and two years of treatment with liposomal doxorubicin 350 mg IV every three weeks. Underwent ultra hypofractionated radiotherapy to the left hilar soft tissue mass. Recent CT scan of the neck and chest shows significant reduction in the left hilar area treated with radiation, with no new lesions or growths. No current pain, weight stable, and no night sweats. Overall, the disease is responding well to treatment with no evidence of progression. - Schedule follow-up CT scan of the neck and chest in four months. - Order blood work one week prior to the next follow-up appointment. The patient was advised to call immediately if he has any concerning symptoms in the interval. The patient voices understanding of current disease status and treatment options and is in agreement with the current care plan.  All questions were answered. The patient knows to call the clinic with any problems, questions or concerns. We can certainly see the patient much sooner if necessary.  The total time spent in the appointment was 30 minutes.  Disclaimer: This note was dictated with voice  recognition software. Similar sounding words can inadvertently be transcribed and may not be corrected upon review.

## 2024-01-21 ENCOUNTER — Telehealth: Payer: Self-pay | Admitting: Internal Medicine

## 2024-01-21 NOTE — Telephone Encounter (Signed)
 Scheduled appointments with the patient per LOS notes.

## 2024-01-22 ENCOUNTER — Other Ambulatory Visit: Payer: Self-pay

## 2024-02-17 ENCOUNTER — Encounter (INDEPENDENT_AMBULATORY_CARE_PROVIDER_SITE_OTHER): Admitting: Family Medicine

## 2024-02-17 NOTE — Progress Notes (Unsigned)
 Error. Unable to reach for appt.

## 2024-02-19 NOTE — Progress Notes (Signed)
 PAIN: He is currently in no pain. Denies any pain.  RESPIRATORY: None. Pt is on room air. Noted  None.  SWALLOWING/DIET: Pt denies dysphagia. The patient eats a regular, healthy diet..  Patient has gained some weight  OTHER: Pt complains of fatigue.  BP 102/75 (BP Location: Right Arm, Patient Position: Sitting, Cuff Size: Normal)   Pulse 80   Temp 97.7 F (36.5 C)   Resp 20   Ht 5' 10 (1.778 m)   Wt 130 lb 9.6 oz (59.2 kg)   SpO2 99%   BMI 18.74 kg/m    Wt Readings from Last 3 Encounters:  03/02/24 130 lb 9.6 oz (59.2 kg)  01/19/24 130 lb (59 kg)  10/21/23 129 lb (58.5 kg)

## 2024-02-25 ENCOUNTER — Other Ambulatory Visit: Payer: Self-pay | Admitting: Family Medicine

## 2024-02-26 ENCOUNTER — Telehealth: Payer: Self-pay | Admitting: Radiation Oncology

## 2024-02-26 NOTE — Telephone Encounter (Signed)
 9/11 Called patient after received voicemail to confirm his appt on 9/16.  Patient is aware and grateful for call back.

## 2024-03-02 ENCOUNTER — Ambulatory Visit
Admission: RE | Admit: 2024-03-02 | Discharge: 2024-03-02 | Disposition: A | Discharge status: Home / Self Care | Source: Ambulatory Visit | Attending: Radiation Oncology | Admitting: Radiation Oncology

## 2024-03-02 VITALS — BP 102/75 | HR 80 | Temp 97.7°F | Resp 20 | Ht 70.0 in | Wt 130.6 lb

## 2024-03-02 DIAGNOSIS — C7802 Secondary malignant neoplasm of left lung: Secondary | ICD-10-CM | POA: Diagnosis not present

## 2024-03-02 DIAGNOSIS — C07 Malignant neoplasm of parotid gland: Secondary | ICD-10-CM | POA: Diagnosis not present

## 2024-03-02 DIAGNOSIS — C7989 Secondary malignant neoplasm of other specified sites: Secondary | ICD-10-CM

## 2024-03-02 NOTE — Progress Notes (Shared)
 Radiation Oncology         [phone] ________________________________  Name: Shaun Mcmillan MRN: [id number]  Date: 03/02/2024  DOB: Aug 26, 1940  Follow-Up Visit Note  CC: Shaun Mcmillan LABOR, MD  Shaun Mcmillan LABOR, MD  Diagnosis and Prior Radiotherapy:    [phone]   ICD-10-CM   1. 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 [facility]) Staging form: Cutaneous Carcinoma of the Head and Neck, AJCC 8th Edition - Clinical stage from 10/02/2020: Stage Unknown (cTX, cN3b) - Unsigned Stage prefix: Initial diagnosis Extraosseous extension: Unknown  ==========DELIVERED PLANS==========  First Treatment Date: 2023-11-11 Last Treatment Date: 2023-11-24   Plan Name: Lung_L_UHRT Site: Lung, Left Technique: IMRT Mode: Photon Dose Per Fraction: 5 Gy Prescribed Dose (Delivered / Prescribed): 50 Gy / 50 Gy Prescribed Fxs (Delivered / Prescribed): 10 / 10  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 He completed his most recent radiation to the lungs on 11/24/2023.   In the interim, the patient underwent restaging CT scans which demonstrated the treated left hilar region to be completely resolved; with no evidence of disease progression.   Today, patient denies any issues with his breathing.  He specifically denies any shortness of breath, chest pain, cough, or difficulty with swallowing.  He notes some fatigue and a good appetite.         ALLERGIES:  has no known allergies.  Meds: Current Outpatient Medications  Medication Sig Dispense Refill   acetaminophen  (TYLENOL ) 500 MG tablet Take 1,000 mg by  mouth every 8 (eight) hours as needed for moderate pain (pain score 4-6).     amLODipine  (NORVASC ) 10 MG tablet TAKE 1 TABLET BY MOUTH DAILY 100 tablet 2   aspirin  EC 81 MG tablet Take 1 tablet (81 mg total) by mouth daily. Swallow whole. 30 tablet 11   atorvastatin  (LIPITOR) 40 MG tablet TAKE 1 TABLET BY MOUTH DAILY 100 tablet 3   gabapentin  (NEURONTIN ) 300 MG capsule TAKE 2 CAPSULES BY MOUTH 3 TIMES A DAY (Patient taking differently: Take 300 mg by mouth 3 (three) times daily.) 180 capsule 1   latanoprost  (XALATAN ) 0.005 % ophthalmic solution Place 1 drop into both eyes at bedtime.     LORazepam  (ATIVAN ) 1 MG tablet Take 1 tablet (1 mg total) by mouth daily as needed for anxiety.     losartan  (COZAAR ) 25 MG tablet TAKE 1 TABLET BY MOUTH DAILY 100 tablet 3   Melatonin 5 MG TABS Take 5 mg by mouth at bedtime.     metFORMIN  (GLUCOPHAGE ) 500 MG tablet TAKE 1 TABLET BY MOUTH TWICE  DAILY WITH A MEAL 200 tablet 3   Multiple Vitamin (MULTIVITAMIN) tablet Take 1 tablet by mouth daily.     timolol  (BETIMOL ) 0.5 % ophthalmic solution Place 1 drop into both eyes daily.     No current facility-administered medications for this encounter.    Physical Findings: The patient is in no acute distress. Patient is alert and oriented. Wt Readings from Last 3 Encounters:  03/02/24 130 lb 9.6 oz (59.2 kg)  01/19/24 130 lb (59 kg)  10/21/23 129  lb (58.5 kg)    height is 5' 10 (1.778 m) and weight is 130 lb 9.6 oz (59.2 kg). His temperature is 97.7 F (36.5 C). His blood pressure is 102/75 and his pulse is 80. His respiration is 20 and oxygen saturation is 99%. .  General: Alert and oriented, in no acute distress HEENT: Head is normocephalic. Extraocular movements are intact. Oropharynx is notable for no concerning lesions.  Postoperative changes to the left ear. Neck: Neck is notable for no palpable adenopathy Heart: Regular in rate and rhythm with no murmurs, rubs, or gallops. Chest: Clear to auscultation  bilaterally, with no rhonchi, wheezes, or rales. Abdomen: Soft, nontender, nondistended, with no rigidity or guarding. Extremities: No cyanosis or edema. Lymphatics: see Neck Exam Psychiatric: Judgment and insight are intact. Affect is appropriate.   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 CT of the neck and chest 01/12/2024 showed complete response in the left hilar area, with no new lesions or growths, indicating a positive response to treatment.     Patient is following closely with Dr. Sherrod.  He is scheduled for restaging imaging in December and will follow with medical oncology to review the scans.  Radiation follow-up as needed.  We appreciate the opportunity to take part in this patient's care.  He was encouraged to call with any questions or concerns.  On date of service, in total, I spent 30 minutes on this encounter. Patient was seen in person. Note signed after encounter date; minutes pertain to date of service, only.  _____________________________________    Leeroy Due, PA-C   Lauraine Golden, MD    [facility]  Radiation Oncology Direct Dial: [phone]  Fax: [phone] [email or web address]

## 2024-03-12 IMAGING — CT CT CHEST-ABD-PELV W/ CM
2 of 5 series · 12 of 36 positions shown, 14 images · IV contrast (OMNIPAQUE)
Comparison: Multiple exams, including PET-CT 08/10/2021

CLINICAL DATA: Left parotid squamous cell carcinoma. Biopsy
06/26/2021 revealed well-differentiated squamous cell carcinoma
along a left craniofacial shave biopsy. Hypermetabolic pulmonary
nodules.

* Tracking Code: BO *
EXAM:
CT CHEST, ABDOMEN, AND PELVIS WITH CONTRAST
TECHNIQUE: Multidetector CT imaging of the chest, abdomen and pelvis was
performed following the standard protocol during bolus
administration of intravenous contrast.

[Series 2: cap with · axial · 0.73mm/px · z∈[+1111,+1616]mm · 9 of 125 slices shown, 11 images]
[im 12/125  mediastinal]
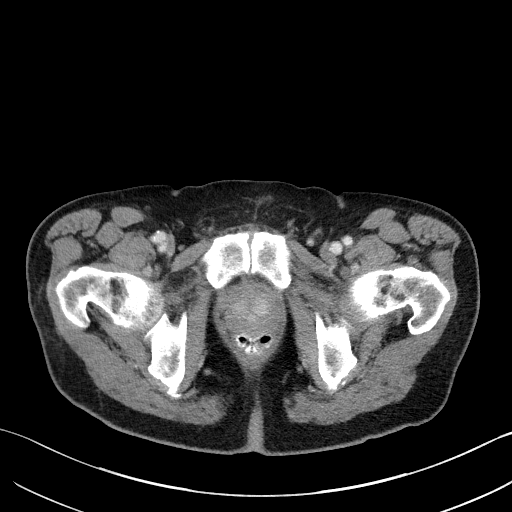
[im 12/125  bone]
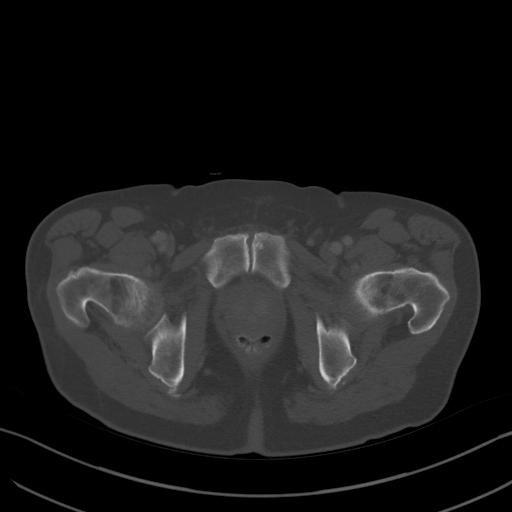
[im 23/125  mediastinal]
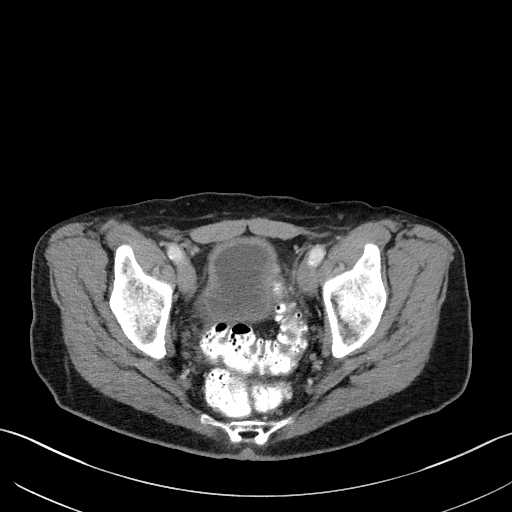
[im 34/125  mediastinal]
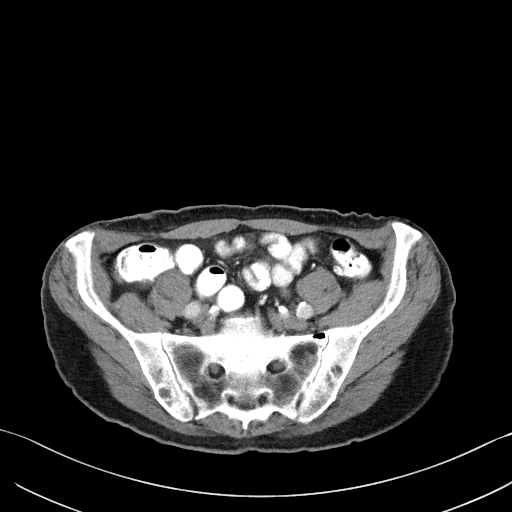
[im 46/125  mediastinal]
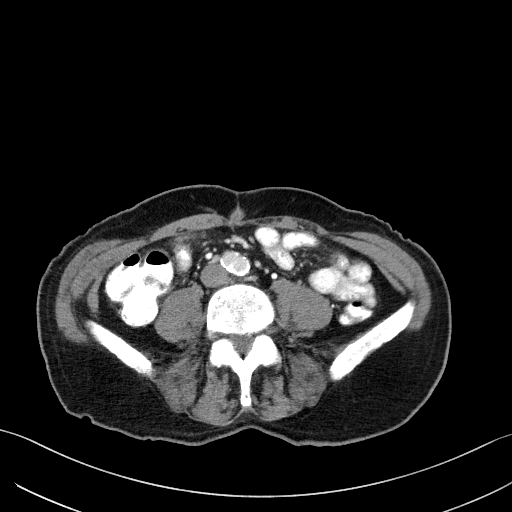
[im 68/125  mediastinal]
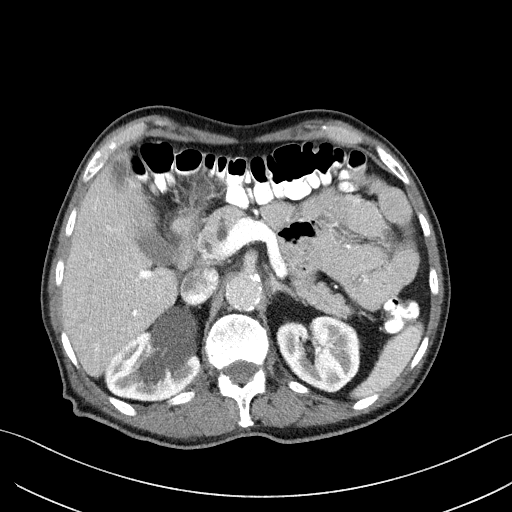
[im 79/125  mediastinal]
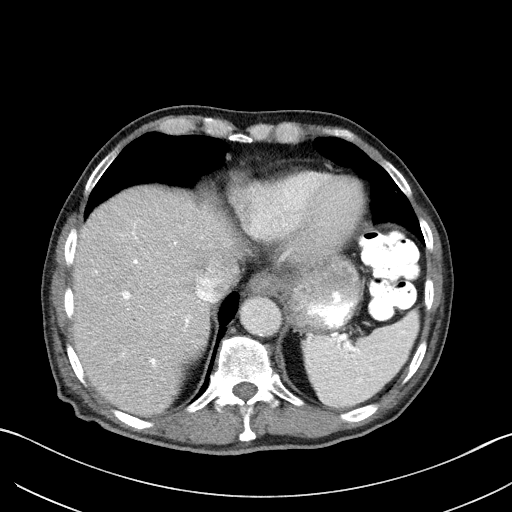
[im 91/125  mediastinal]
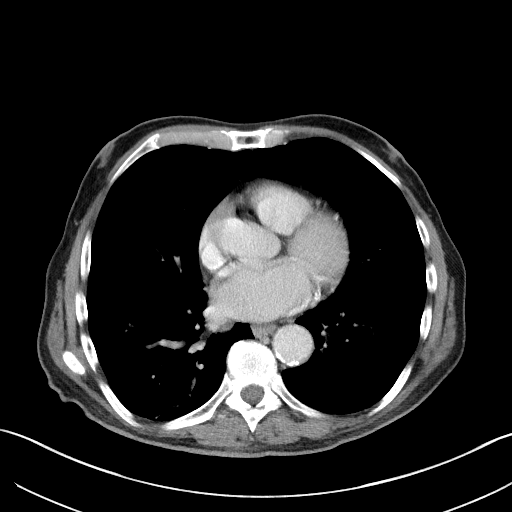
[im 102/125  mediastinal]
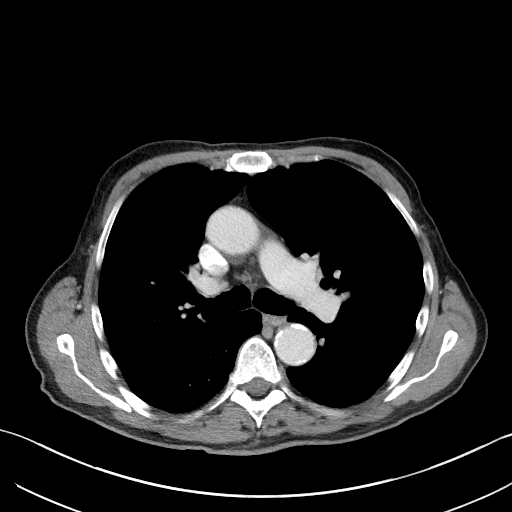
[im 113/125  mediastinal]
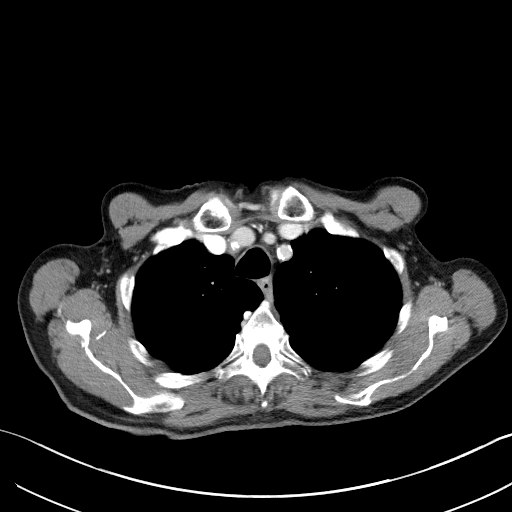
[im 113/125  bone]
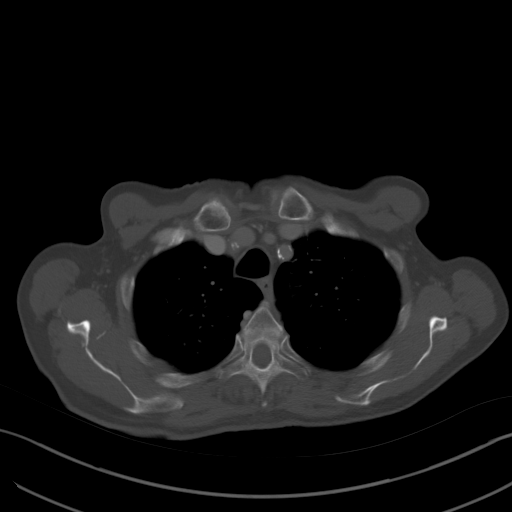

[Series 5: coronals · coronal · 0.69mm/px · 3 of 136 slices shown]
[im 28/136  mediastinal]
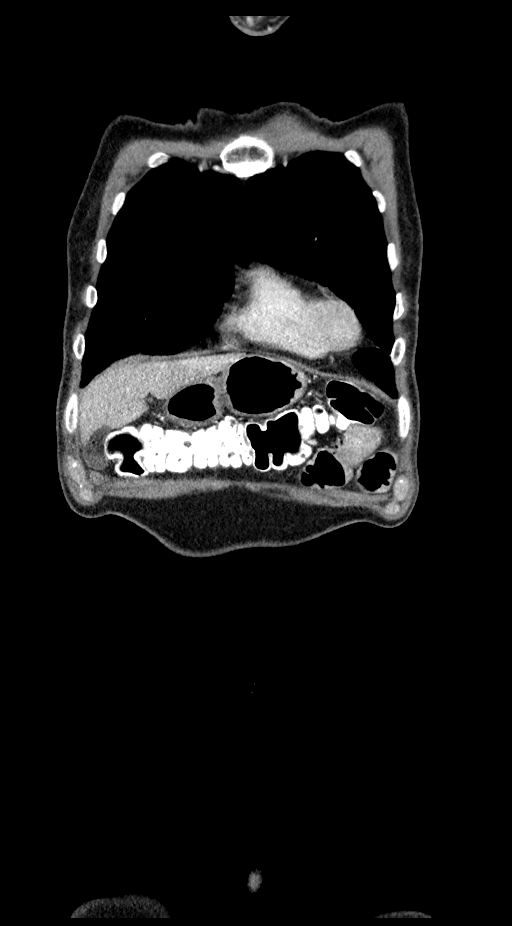
[im 55/136  mediastinal]
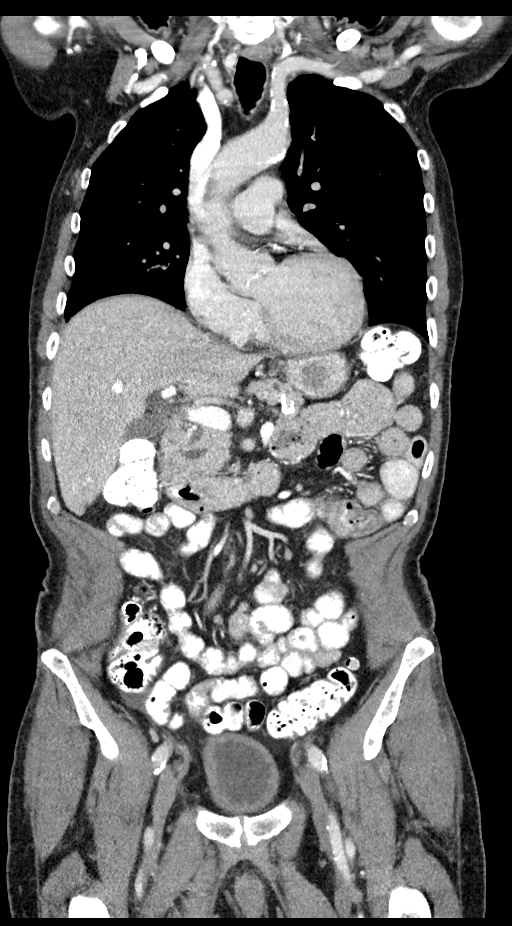
[im 82/136  mediastinal]
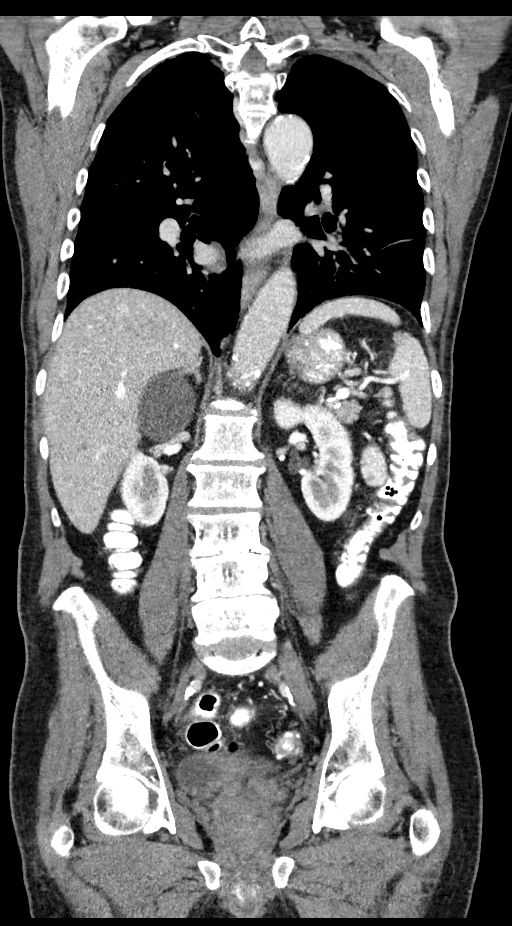

[12 of 36 positions shown; findings below may reference images not displayed]

RADIATION DOSE REDUCTION: This exam was performed according to the
departmental dose-optimization program which includes automated
exposure control, adjustment of the mA and/or kV according to
patient size and/or use of iterative reconstruction technique.

CONTRAST:  80mL OMNIPAQUE IOHEXOL 300 MG/ML  SOLN
FINDINGS: CT CHEST FINDINGS

Cardiovascular: Coronary, aortic arch, and branch vessel
atherosclerotic vascular disease. Aortic valve calcifications noted.

Mediastinum/Nodes: No pathologic adenopathy in the chest.

Lungs/Pleura: 7 by 6 mm right middle lobe pulmonary nodule on image
92 series 4, stable. Stable mild subpleural nodularity in the left
upper lobe on image 36 series 4.

Left upper lobe pulmonary nodule measures 1.5 by 1.3 cm on image 71
series 4, previously 1.3 by 1.0 cm.

0.3 cm left upper lobe nodule anteriorly on image 80 of series 4,
previously 0.4 cm.

Mild scarring in both lower lobes and in the lingula.

A previous left upper lobe nodule around the level of image 56 of
series 4 of the current exam has resolved although there is some
mild localized airway thickening in this vicinity.

A previous small right upper lobe nodule near the minor fissure has
resolved.

Musculoskeletal: Unremarkable

CT ABDOMEN PELVIS FINDINGS

Hepatobiliary: 1.0 by 0.7 by 1.1 cm focus of accentuated enhancement
in the dome of the right hepatic lobe on image 42 series 2,
passively a flash filling hemangioma but technically nonspecific. No
well-defined lesion in this vicinity is visible on the delayed phase
images. There is no hypermetabolic activity in this vicinity on
prior PET-CT of 08/10/2021.

In the inferior margin of segment 4 of the liver I do not see a
definite lesion to correlate with the questionable focus of
hypermetabolic activity on prior PET-CT.

0.4 cm nonspecific lesion inferiorly in the right hepatic lobe on
image 69 series 2, technically nonspecific although statistically
likely to be a small benign cyst or similar lesion.

Mildly contracted gallbladder.

Pancreas: Dorsal pancreatic duct dilatation in the pancreatic head
and body, up to 7 mm in diameter. No pancreatic parenchymal
abnormality is identified.

Spleen: Unremarkable

Adrenals/Urinary Tract: Both adrenal glands appear unremarkable.
Bosniak category 1 cyst of the right kidney upper pole measuring
by 4.5 cm. Adrenal glands unremarkable. Slight wall thickening in
the urinary bladder probably from nondistention.

Stomach/Bowel: Scattered sigmoid colon diverticula.

Vascular/Lymphatic: Atherosclerosis is present, including aortoiliac
atherosclerotic disease.

Reproductive: Unremarkable

Other: No supplemental non-categorized findings.

Musculoskeletal: Lumbar spondylosis and degenerative disc disease.
IMPRESSION: 1. Mixed appearance of the previously hypermetabolic pulmonary
nodules. The dominant left upper lobe pulmonary nodule has enlarged
and currently measures 1.5 by 1.3 cm. Two other nodules are
relatively stable and 2 of the previous small pulmonary nodules have
resolved.
2. 1 cm hyperdense lesion in the dome of the right hepatic lobe is
probably a hemangioma or similar benign lesion but technically
nonspecific. This may merit surveillance given the patient's
history.
3. Unexplained dorsal pancreatic duct dilatation (to 7 mm in
diameter) in the pancreatic head and body, without biliary
dilatation or definite parenchymal lesion in the pancreas. This
could be further assessed with MRCP with and without contrast, or
ERCP.
4. Other imaging findings of potential clinical significance: Aortic
Atherosclerosis ([id number]). Coronary and systemic
atherosclerosis. Aortic valve atherosclerosis. Scattered scarring in
the lung bases. Scattered sigmoid colon diverticula. Lumbar
spondylosis and degenerative disc disease.

## 2024-03-19 NOTE — Progress Notes (Addendum)
 Shaun Mcmillan                                          MRN: [id number]   03/19/2024   The [facility] Quality Team Specialist reviewed this patient medical record for the purposes of chart review for care gap closure. The following were reviewed: chart review for care gap closure-kidney health evaluation for diabetes:eGFR  and uACR.  05/24/2024- no uacr found    [facility] Quality Team

## 2024-03-24 ENCOUNTER — Telehealth: Payer: Self-pay | Admitting: Medical Oncology

## 2024-03-24 NOTE — Telephone Encounter (Signed)
 Returned call

## 2024-03-24 NOTE — Telephone Encounter (Signed)
 Spoke with wife re her diagnosis and appt with Dr Loretha.

## 2024-03-29 ENCOUNTER — Encounter: Payer: Self-pay | Admitting: Internal Medicine

## 2024-04-09 ENCOUNTER — Telehealth: Payer: Self-pay | Admitting: *Deleted

## 2024-04-09 NOTE — Telephone Encounter (Signed)
 Return call to patient.  Informed him ok per Cassie/PA to go to the pharmacy or his PCP for the Flu and Shingles shot.  Patient states he has appointment today at [facility]. Advised him to speak with his pharmacist if he has any further questions.  Patient verbalized understanding

## 2024-04-29 ENCOUNTER — Telehealth: Payer: Self-pay

## 2024-04-29 NOTE — Telephone Encounter (Signed)
 Patient is overdue for follow up with PCP. Called the patient to make an appt. No answer and no VM set up.

## 2024-05-05 ENCOUNTER — Telehealth: Payer: Self-pay

## 2024-05-05 NOTE — Telephone Encounter (Signed)
 Patient has not been seen in over a year. They are on the list from [employer] that they need an appt. I have called them and was unable to leave message-no vm set up. If they call back please get them scheduled to see their PCP. If they are no longer a pt please tell them to let [employer] [facility] know and remove the current PCP.

## 2024-05-17 NOTE — Telephone Encounter (Signed)
 error

## 2024-05-18 ENCOUNTER — Ambulatory Visit (HOSPITAL_COMMUNITY)
Admission: RE | Admit: 2024-05-18 | Discharge: 2024-05-18 | Disposition: A | Source: Ambulatory Visit | Attending: Internal Medicine | Admitting: Internal Medicine

## 2024-05-18 ENCOUNTER — Inpatient Hospital Stay: Attending: Internal Medicine

## 2024-05-18 DIAGNOSIS — Z85828 Personal history of other malignant neoplasm of skin: Secondary | ICD-10-CM | POA: Diagnosis present

## 2024-05-18 DIAGNOSIS — C7800 Secondary malignant neoplasm of unspecified lung: Secondary | ICD-10-CM

## 2024-05-18 DIAGNOSIS — Z08 Encounter for follow-up examination after completed treatment for malignant neoplasm: Secondary | ICD-10-CM | POA: Insufficient documentation

## 2024-05-18 LAB — CBC WITH DIFFERENTIAL (CANCER CENTER ONLY)
Abs Immature Granulocytes: 0.02 K/uL (ref 0.00–0.07)
Basophils Absolute: 0 K/uL (ref 0.0–0.1)
Basophils Relative: 0 %
Eosinophils Absolute: 0.1 K/uL (ref 0.0–0.5)
Eosinophils Relative: 1 %
HCT: 42 % (ref 39.0–52.0)
Hemoglobin: 14 g/dL (ref 13.0–17.0)
Immature Granulocytes: 0 %
Lymphocytes Relative: 18 %
Lymphs Abs: 1.2 K/uL (ref 0.7–4.0)
MCH: 29.8 pg (ref 26.0–34.0)
MCHC: 33.3 g/dL (ref 30.0–36.0)
MCV: 89.4 fL (ref 80.0–100.0)
Monocytes Absolute: 0.4 K/uL (ref 0.1–1.0)
Monocytes Relative: 6 %
Neutro Abs: 5.2 K/uL (ref 1.7–7.7)
Neutrophils Relative %: 75 %
Platelet Count: 198 K/uL (ref 150–400)
RBC: 4.7 MIL/uL (ref 4.22–5.81)
RDW: 13.2 % (ref 11.5–15.5)
WBC Count: 6.9 K/uL (ref 4.0–10.5)
nRBC: 0 % (ref 0.0–0.2)

## 2024-05-18 LAB — CMP (CANCER CENTER ONLY)
ALT: 13 U/L (ref 0–44)
AST: 22 U/L (ref 15–41)
Albumin: 4.2 g/dL (ref 3.5–5.0)
Alkaline Phosphatase: 112 U/L (ref 38–126)
Anion gap: 8 (ref 5–15)
BUN: 19 mg/dL (ref 8–23)
CO2: 30 mmol/L (ref 22–32)
Calcium: 9.6 mg/dL (ref 8.9–10.3)
Chloride: 104 mmol/L (ref 98–111)
Creatinine: 0.81 mg/dL (ref 0.61–1.24)
GFR, Estimated: >60 mL/min (ref >60)
Glucose, Bld: 143 mg/dL — ABNORMAL HIGH (ref 70–99)
Potassium: 3.7 mmol/L (ref 3.5–5.1)
Sodium: 142 mmol/L (ref 135–145)
Total Bilirubin: 0.6 mg/dL (ref 0.0–1.2)
Total Protein: 7 g/dL (ref 6.5–8.1)

## 2024-05-18 LAB — TSH: TSH: 2.57 u[IU]/mL (ref 0.350–4.500)

## 2024-05-18 MED ORDER — SODIUM CHLORIDE (PF) 0.9 % IJ SOLN
INTRAMUSCULAR | Status: AC
  Filled 2024-05-18: qty 50

## 2024-05-18 MED ORDER — IOHEXOL 300 MG/ML  SOLN
75.0000 mL | Freq: Once | INTRAMUSCULAR | Status: AC | PRN
  Administered 2024-05-18: 75 mL via INTRAVENOUS

## 2024-05-25 ENCOUNTER — Inpatient Hospital Stay: Admitting: Internal Medicine

## 2024-05-25 VITALS — BP 121/73 | HR 72 | Temp 97.6°F | Resp 17 | Ht 70.0 in | Wt 132.0 lb

## 2024-05-25 DIAGNOSIS — C3490 Malignant neoplasm of unspecified part of unspecified bronchus or lung: Secondary | ICD-10-CM | POA: Diagnosis not present

## 2024-05-25 DIAGNOSIS — Z08 Encounter for follow-up examination after completed treatment for malignant neoplasm: Secondary | ICD-10-CM | POA: Diagnosis not present

## 2024-05-25 NOTE — Progress Notes (Signed)
 [facility] [phone]   [phone]  OFFICE PROGRESS NOTE  Shaun Mcmillan LABOR, MD [address] 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 He initially presented with skin cancer around the parotid gland in April 2022.   PRIOR THERAPY: 1) status post left parotidectomy with skin resection and rotational flap completed by Dr. Jesus in April 2022. Final pathology showed squamous cell carcinoma.  2) status post radiation therapy under the care of Dr. Izell completing 33 fractions on December 19, 2020 for a total of 66 Gray  3) Libtayo  (Cempilimab) 350 Mg IV every 3 weeks started August 10, 2021. Status post 35 cycles of treatment.  4) Ultra hypofractionated radiotherapy to the left hilar mass under the care of Dr. Izell  CURRENT THERAPY: Observation.  INTERVAL HISTORY: Shaun Mcmillan 83 y.o. male returns to the clinic today for follow-up visit accompanied by his wife [place].Discussed the use of AI scribe software for clinical note transcription with the patient, who gave verbal consent to proceed.  History of Present Illness Shaun Mcmillan is an 83 year old male with stage four squamous cell carcinoma of the skin with pulmonary involvement who presents for evaluation with repeat CT scans for restaging of his disease. He is accompanied by his wife, [name].  He was diagnosed with stage four squamous cell carcinoma of the skin with pulmonary involvement in 2023. He has been treated with Libtayo  for two years and has undergone radiotherapy to address a hilar mass. He is currently under observation and is here for evaluation with repeat CT scans of the chest, abdomen, pelvis, neck, and chest for restaging of his disease.  He has no new complaints since his last visit. No chest pain, shortness of breath, swelling, or gland enlargement. He has lost weight and  currently weighs 132 pounds, which he notes is the most he has weighed in years. No nausea, vomiting, or diarrhea.  He is currently cooking for his wife, who has been diagnosed with breast cancer. He experiences some difficulty with eating, as he sometimes feels choked.    MEDICAL HISTORY: Past Medical History:  Diagnosis Date   Arthritis    Cancer (HCC)    Diabetes mellitus type II    Glaucoma    sees Dr. 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  MEDICATIONS:  Current Outpatient Medications  Medication Sig Dispense Refill   acetaminophen  (TYLENOL ) 500 MG tablet Take 1,000 mg by mouth every 8 (eight) hours as needed for moderate pain (pain score 4-6).     amLODipine  (NORVASC ) 10 MG tablet TAKE 1 TABLET BY MOUTH DAILY 100 tablet 2   aspirin  EC 81 MG tablet Take 1 tablet (81 mg total) by mouth daily. Swallow whole. 30 tablet 11   atorvastatin  (LIPITOR) 40 MG tablet TAKE 1 TABLET BY MOUTH DAILY 100 tablet 3   gabapentin  (NEURONTIN ) 300 MG capsule TAKE 2 CAPSULES BY MOUTH 3 TIMES A DAY (Patient taking differently: Take 300 mg by mouth 3 (three) times daily.) 180 capsule 1   latanoprost  (  XALATAN ) 0.005 % ophthalmic solution Place 1 drop into both eyes at bedtime.     LORazepam  (ATIVAN ) 1 MG tablet Take 1 tablet (1 mg total) by mouth daily as needed for anxiety.     losartan  (COZAAR ) 25 MG tablet TAKE 1 TABLET BY MOUTH DAILY 100 tablet 3   Melatonin 5 MG TABS Take 5 mg by mouth at bedtime.     metFORMIN  (GLUCOPHAGE ) 500 MG tablet TAKE 1 TABLET BY MOUTH TWICE  DAILY WITH A MEAL 200 tablet 3   Multiple Vitamin (MULTIVITAMIN) tablet Take 1 tablet by mouth daily.     timolol  (BETIMOL ) 0.5 % ophthalmic solution Place 1 drop into both eyes daily.      No current facility-administered medications for this visit.    SURGICAL HISTORY:  Past Surgical History:  Procedure Laterality Date   APPENDECTOMY     BRONCHIAL BIOPSY  10/13/2023   Procedure: BRONCHOSCOPY, WITH BIOPSY;  Surgeon: Shelah Lamar RAMAN, MD;  Location: [facility] ENDOSCOPY;  Service: Pulmonary;;   BRONCHIAL BRUSHINGS  10/13/2023   Procedure: BRONCHOSCOPY, WITH BRUSH BIOPSY;  Surgeon: Shelah Lamar RAMAN, MD;  Location: [facility] ENDOSCOPY;  Service: Pulmonary;;   BRONCHIAL NEEDLE ASPIRATION BIOPSY  10/13/2023   Procedure: BRONCHOSCOPY, WITH NEEDLE ASPIRATION BIOPSY;  Surgeon: Shelah Lamar RAMAN, MD;  Location: [facility] ENDOSCOPY;  Service: Pulmonary;;   colonscopy  03/12/2016   per Dr. Rosalie, benign polyps, repeat in 5 yrs   EYE SURGERY Bilateral    cataracts   left craniotomy  11/03/2008   per Dr. Alm Molt for a subdrual hematoma   PAROTIDECTOMY Left 09/20/2020   Procedure: PAROTIDECTOMY with resection of skin and rotational flap;  Surgeon: Jesus Oliphant, MD;  Location: [facility] OR;  Service: ENT;  Laterality: Left;   surgery for ocmpound fracture     right leg   VIDEO BRONCHOSCOPY WITH ENDOBRONCHIAL ULTRASOUND N/A 10/13/2023   Procedure: BRONCHOSCOPY, WITH EBUS;  Surgeon: Shelah Lamar RAMAN, MD;  Location: [facility] ENDOSCOPY;  Service: Pulmonary;  Laterality: N/A;  with fluoro    REVIEW OF SYSTEMS:  Constitutional: negative Eyes: negative Ears, nose, mouth, throat, and face: negative Respiratory: negative Cardiovascular: negative Gastrointestinal: negative Genitourinary:negative Integument/breast: negative Hematologic/lymphatic: negative Musculoskeletal:negative Neurological: positive for paresthesia Behavioral/Psych: negative Endocrine: negative Allergic/Immunologic: negative   PHYSICAL EXAMINATION: General appearance: alert, cooperative, and no distress Head: Normocephalic, without obvious abnormality, atraumatic Neck: no adenopathy, no JVD, supple, symmetrical, trachea midline, and thyroid  not  enlarged, symmetric, no tenderness/mass/nodules Lymph nodes: Cervical, supraclavicular, and axillary nodes normal. Resp: clear to auscultation bilaterally Back: symmetric, no curvature. ROM normal. No CVA tenderness. Cardio: regular rate and rhythm, S1, S2 normal, no murmur, click, rub or gallop GI: soft, non-tender; bowel sounds normal; no masses,  no organomegaly Extremities: extremities normal, atraumatic, no cyanosis or edema Neurologic: Alert and oriented X 3, normal strength and tone. Normal symmetric reflexes. Normal coordination and gait  ECOG PERFORMANCE STATUS: 1 - Symptomatic but completely ambulatory  Blood pressure 121/73, pulse 72, temperature 97.6 F (36.4 C), temperature source Temporal, resp. rate 17, height 5' 10 (1.778 m), weight 132 lb (59.9 kg), SpO2 97%.  LABORATORY DATA: Lab Results  Component Value Date   WBC 6.9 05/18/2024   HGB 14.0 05/18/2024   HCT 42.0 05/18/2024   MCV 89.4 05/18/2024   PLT 198 05/18/2024      Chemistry      Component Value Date/Time   NA 142 05/18/2024 1142   K 3.7 05/18/2024 1142   CL 104 05/18/2024 1142   CO2  30 05/18/2024 1142   BUN 19 05/18/2024 1142   CREATININE 0.81 05/18/2024 1142   CREATININE 0.69 (L) 01/08/2021 1553      Component Value Date/Time   CALCIUM  9.6 05/18/2024 1142   ALKPHOS 112 05/18/2024 1142   AST 22 05/18/2024 1142   ALT 13 05/18/2024 1142   BILITOT 0.6 05/18/2024 1142       RADIOGRAPHIC STUDIES: CT Soft Tissue Neck W Contrast Result Date: 05/23/2024 EXAM: CT NECK WITH CONTRAST 05/18/2024 01:39:00 PM TECHNIQUE: CT of the neck was performed with the administration of 75 mL of iohexol  (OMNIPAQUE ) 300 MG/ML solution. Multiplanar reformatted images are provided for review. Automated exposure control, iterative reconstruction, and/or weight based adjustment of the mA/kV was utilized to reduce the radiation dose to as low as reasonably achievable. COMPARISON: CT Neck 01/12/2024, CT Neck 09/03/2023, and  earlier studies. Chest CT from 05/18/2024, reported separately. CLINICAL HISTORY: 83 year old male. History of treated left parotid gland squamous cell carcinoma . Parotidectomy in 2022, radiation treatment. cancer staging, metastatic malignant neoplasm to lung, head and neck cancer monitoring. FINDINGS: AERODIGESTIVE TRACT: No discrete mass. No edema. SALIVARY GLANDS: Stable appearance of left parotid gland resection. No suspicious soft tissue enhancement there. Stable left retromandibular surgical clips. Soft tissue at the left styloid foramen remains stable. The right parotid gland and submandibular glands are unremarkable. THYROID : Thyroid  is stable and within normal limits for age. LYMPH NODES: No suspicious cervical lymphadenopathy. SOFT TISSUES: No mass or fluid collection. BRAIN, ORBITS, SINUSES AND MASTOIDS: Venous dominant intravascular contrast timing in the neck today. Chronic carotid atherosclerosis. Major vascular structures in the bilateral neck and at the skull base appear to remain patent. Chronic severe ICA siphon calcified atherosclerosis. Visible paranasal sinuses, tympanic cavities and mastoids remain well aerated. LUNGS AND MEDIASTINUM: Dedicated Chest CT reported separately. BONES: Widespread chronic cervical spine degeneration. No acute or suspicious osseous lesion. IMPRESSION: 1. Stable and satisfactory post-treatment appearance of the Neck. Unchanged left parotidectomy site. 2. Chest CT reported separately. Electronically signed by: Helayne Hurst MD 05/23/2024 10:13 AM EST RP Workstation: [id number]   CT Chest W Contrast Result Date: 05/23/2024 EXAM: CT CHEST WITH CONTRAST 05/18/2024 01:39:00 PM TECHNIQUE: CT of the chest was performed with the administration of intravenous contrast. Multiplanar reformatted images are provided for review. Automated exposure control, iterative reconstruction, and/or weight based adjustment of the milliamperes/kilovolts was utilized to reduce the radiation  dose to as low as reasonably achievable. CONTRAST: 75 cubic centimeters iohexol  (OMNIPAQUE ) 300 milligrams per milliliter solution. COMPARISON: 01/12/2024. CLINICAL HISTORY: Skin cancer, staging. FINDINGS: MEDIASTINUM: Heart and pericardium are unremarkable. The central airways are clear. Mild atherosclerotic calcification is noted in the wall of the thoracic aorta. LYMPH NODES: No mediastinal, hilar or axillary lymphadenopathy. LUNGS AND PLEURA: Scattered bilateral pulmonary nodules are identified, measuring up to 5 millimeters in the mid left lung on image 71/3. No new suspicious pulmonary nodule or mass. Ill-defined area of ground-glass airspace disease identified posterior right upper lobe on 68/3, likely infectious/inflammatory. Persistent linear subsegmental atelectasis or scarring noted in both lung bases. No pleural effusion or pneumothorax. SOFT TISSUES/BONES: No acute abnormality of the bones or soft tissues. UPPER ABDOMEN: Limited images of the upper abdomen demonstrate: A simple cyst is again noted in the upper pole of the right kidney. Main pancreatic duct remains prominent measuring up to 6 millimeters diameter through the pancreatic neck, stable. IMPRESSION: 1. Scattered bilateral pulmonary nodules up to 5 mm without new suspicious pulmonary nodule or mass, indeterminate in the setting of cancer  staging. Continued attention on follow-up recommended 2. Ill-defined ground-glass opacity in the posterior right upper lobe, likely infectious or inflammatory. Follow-up recommended to ensure resolution . 3. Persistent linear subsegmental atelectasis or scarring at the lung bases. 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  He had evidence for disease progression and metastasis to the lung in February 2023. The patient is currently undergoing treatment with immunotherapy with Libtayo  (Cempilimab) 350 Mg IV every 3 weeks status post 35 cycles.  The patient has been tolerating this treatment fairly well with no concerning adverse effects. He also underwent ultra hypofractionated radiotherapy to a left hilar mass under the care of Dr. Izell. He is currently on observation and feeling fine. The patient had repeat CT scan of the neck and the chest performed recently.  I personally and independently reviewed the scan and discussed the result with the patient and his wife.  His scan showed no concerning findings for disease progression. 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 Currently on observation. Recent CT scan of the neck and chest shows no evidence of disease progression. No new symptoms such as chest pain, shortness of breath, or weight loss. Weight is stable at 132 pounds, the highest in years. No nausea, vomiting, or diarrhea. Overall, disease is well-controlled. - Continue observation. - Scheduled follow-up appointment in six months. The patient was advised to call immediately if he has any other concerning symptoms in the interval.  The patient voices understanding of current disease status and treatment options and is in agreement with the current care plan.  All questions were answered. The patient knows to call the clinic with any problems, questions or concerns. We can certainly see the patient much sooner if necessary.  The total time spent in the appointment was 30 minutes.  Disclaimer: This note was dictated with voice recognition software. Similar sounding  words can inadvertently be transcribed and may not be corrected upon review.

## 2024-05-26 ENCOUNTER — Other Ambulatory Visit: Payer: Self-pay

## 2024-05-31 ENCOUNTER — Other Ambulatory Visit: Payer: Self-pay | Admitting: Family Medicine

## 2024-07-14 ENCOUNTER — Encounter: Payer: Self-pay | Admitting: Family Medicine

## 2024-07-14 ENCOUNTER — Ambulatory Visit: Admitting: Family Medicine

## 2024-07-14 VITALS — BP 120/68 | HR 62 | Temp 97.7°F | Ht 70.0 in | Wt 135.6 lb

## 2024-07-14 DIAGNOSIS — N401 Enlarged prostate with lower urinary tract symptoms: Secondary | ICD-10-CM

## 2024-07-14 DIAGNOSIS — Z7984 Long term (current) use of oral hypoglycemic drugs: Secondary | ICD-10-CM

## 2024-07-14 DIAGNOSIS — N138 Other obstructive and reflux uropathy: Secondary | ICD-10-CM

## 2024-07-14 DIAGNOSIS — E118 Type 2 diabetes mellitus with unspecified complications: Secondary | ICD-10-CM | POA: Diagnosis not present

## 2024-07-14 DIAGNOSIS — E785 Hyperlipidemia, unspecified: Secondary | ICD-10-CM | POA: Diagnosis not present

## 2024-07-14 DIAGNOSIS — E1140 Type 2 diabetes mellitus with diabetic neuropathy, unspecified: Secondary | ICD-10-CM

## 2024-07-14 DIAGNOSIS — E559 Vitamin D deficiency, unspecified: Secondary | ICD-10-CM

## 2024-07-14 DIAGNOSIS — G629 Polyneuropathy, unspecified: Secondary | ICD-10-CM | POA: Diagnosis not present

## 2024-07-14 DIAGNOSIS — I10 Essential (primary) hypertension: Secondary | ICD-10-CM

## 2024-07-14 DIAGNOSIS — E538 Deficiency of other specified B group vitamins: Secondary | ICD-10-CM

## 2024-07-14 LAB — CBC WITH DIFFERENTIAL/PLATELET
Basophils Absolute: 0 10*3/uL (ref 0.0–0.1)
Basophils Relative: 0.3 % (ref 0.0–3.0)
Eosinophils Absolute: 0 10*3/uL (ref 0.0–0.7)
Eosinophils Relative: 0.4 % (ref 0.0–5.0)
HCT: 41.8 % (ref 39.0–52.0)
Hemoglobin: 14 g/dL (ref 13.0–17.0)
Lymphocytes Relative: 15.5 % (ref 12.0–46.0)
Lymphs Abs: 1.1 10*3/uL (ref 0.7–4.0)
MCHC: 33.6 g/dL (ref 30.0–36.0)
MCV: 88.5 fl (ref 78.0–100.0)
Monocytes Absolute: 0.4 10*3/uL (ref 0.1–1.0)
Monocytes Relative: 5.9 % (ref 3.0–12.0)
Neutro Abs: 5.6 10*3/uL (ref 1.4–7.7)
Neutrophils Relative %: 77.9 % — ABNORMAL HIGH (ref 43.0–77.0)
Platelets: 234 10*3/uL (ref 150.0–400.0)
RBC: 4.72 Mil/uL (ref 4.22–5.81)
RDW: 13.8 % (ref 11.5–15.5)
WBC: 7.2 10*3/uL (ref 4.0–10.5)

## 2024-07-14 LAB — MICROALBUMIN / CREATININE URINE RATIO
Creatinine,U: 32.9 mg/dL
Microalb Creat Ratio: UNDETERMINED mg/g (ref 0.0–30.0)
Microalb, Ur: <0.7 mg/dL

## 2024-07-14 LAB — BASIC METABOLIC PANEL WITH GFR
BUN: 16 mg/dL (ref 6–23)
CO2: 35 meq/L — ABNORMAL HIGH (ref 19–32)
Calcium: 10.2 mg/dL (ref 8.4–10.5)
Chloride: 101 meq/L (ref 96–112)
Creatinine, Ser: 0.85 mg/dL (ref 0.40–1.50)
GFR: 80.28 mL/min
Glucose, Bld: 90 mg/dL (ref 70–99)
Potassium: 4.3 meq/L (ref 3.5–5.1)
Sodium: 140 meq/L (ref 135–145)

## 2024-07-14 LAB — LIPID PANEL
Cholesterol: 142 mg/dL (ref 28–200)
HDL: 64 mg/dL
LDL Cholesterol: 65 mg/dL (ref 10–99)
NonHDL: 77.83
Total CHOL/HDL Ratio: 2
Triglycerides: 65 mg/dL (ref 10.0–149.0)
VLDL: 13 mg/dL (ref 0.0–40.0)

## 2024-07-14 LAB — HEPATIC FUNCTION PANEL
ALT: 10 U/L (ref 3–53)
AST: 16 U/L (ref 5–37)
Albumin: 4.4 g/dL (ref 3.5–5.2)
Alkaline Phosphatase: 96 U/L (ref 39–117)
Bilirubin, Direct: 0.2 mg/dL (ref 0.1–0.3)
Total Bilirubin: 0.8 mg/dL (ref 0.2–1.2)
Total Protein: 7 g/dL (ref 6.0–8.3)

## 2024-07-14 LAB — HEMOGLOBIN A1C: Hgb A1c MFr Bld: 5.9 % (ref 4.6–6.5)

## 2024-07-14 MED ORDER — AMITRIPTYLINE HCL 10 MG PO TABS: 10.0000 mg | ORAL_TABLET | Freq: Every day | ORAL | 5 refills | Status: AC

## 2024-07-14 NOTE — Progress Notes (Signed)
 "  Subjective:    Patient ID: Shaun Mcmillan, male    DOB: 05/30/1941, 84 y.o.   MRN: [id number]  HPI Here to follow up on issues. His BP has been stable. He has not checked his glucoses for a few months. His bowels move daily and he urinates freely. 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 This causes him numbness and tingling, and it can be mildly painful at times. He tried Gabapentin  for this, but he could not tolerate this due to sedation. He did not want to try Pregabalin because he was worried about possible sedation effects.    Review of Systems  Constitutional: Negative.   HENT: Negative.    Eyes: Negative.   Respiratory: Negative.    Cardiovascular: Negative.   Gastrointestinal: Negative.   Genitourinary: Negative.   Musculoskeletal: Negative.   Skin: Negative.   Neurological:  Positive for numbness. Negative for weakness.  Psychiatric/Behavioral: Negative.         Objective:   Physical Exam Constitutional:      General: He is not in acute distress.    Appearance: Normal appearance. He is well-developed. He is not diaphoretic.  HENT:     Head: Normocephalic and atraumatic.     Right Ear: External ear normal.     Left Ear: External ear normal.     Nose: Nose normal.     Mouth/Throat:     Pharynx: No oropharyngeal exudate.  Eyes:     General: No scleral icterus.       Right eye: No discharge.        Left eye: No discharge.     Conjunctiva/sclera: Conjunctivae normal.     Pupils: Pupils are equal, round, and reactive to light.  Neck:     Thyroid : No thyromegaly.     Vascular: No JVD.     Trachea: No tracheal deviation.  Cardiovascular:     Rate and Rhythm: Normal rate and regular rhythm.     Pulses: Normal pulses.     Heart sounds: Normal heart sounds. No murmur heard.    No friction rub. No gallop.  Pulmonary:     Effort: Pulmonary effort is normal. No respiratory distress.     Breath sounds: Normal breath sounds. No wheezing or  rales.  Chest:     Chest wall: No tenderness.  Abdominal:     General: Bowel sounds are normal. There is no distension.     Palpations: Abdomen is soft. There is no mass.     Tenderness: There is no abdominal tenderness. There is no guarding or rebound.  Genitourinary:    Penis: Normal. No tenderness.      Testes: Normal.  Musculoskeletal:        General: No tenderness. Normal range of motion.     Cervical back: Neck supple.  Lymphadenopathy:     Cervical: No cervical adenopathy.  Skin:    General: Skin is warm and dry.     Coloration: Skin is not pale.     Findings: No erythema or rash.  Neurological:     General: No focal deficit present.     Mental Status: He is alert and oriented to person, place, and time.     Cranial Nerves: No cranial nerve deficit.     Motor: No abnormal muscle tone.     Coordination: Coordination normal.     Deep Tendon Reflexes: Reflexes are normal and symmetric. Reflexes normal.  Psychiatric:  Mood and Affect: Mood normal.        Behavior: Behavior normal.        Thought Content: Thought content normal.        Judgment: Judgment normal.           Assessment & Plan:  His HTN and BPH ar stable. For the type 2 diabetes, we will check an A1c. We will get other labs as well to check lipids, etc. For the neuropathy we will check a B12 level. He will also try taking Amitriptyline  10 mg at bedtime. I personally spent a total of 35 minutes in the care of the patient today including getting/reviewing separately obtained history, performing a medically appropriate exam/evaluation, counseling and educating, and placing orders.  Garnette Olmsted, MD   "

## 2024-07-15 ENCOUNTER — Encounter: Payer: Self-pay | Admitting: Internal Medicine

## 2024-07-15 LAB — VITAMIN B12: Vitamin B-12: 358 pg/mL (ref 211–911)

## 2024-07-15 LAB — VITAMIN D 25 HYDROXY (VIT D DEFICIENCY, FRACTURES): VITD: 46.48 ng/mL (ref 30.00–100.00)

## 2024-07-15 LAB — TSH: TSH: 1.65 u[IU]/mL (ref 0.35–5.50)

## 2024-07-15 LAB — PSA: PSA: 1.71 ng/mL (ref 0.10–4.00)

## 2024-07-16 ENCOUNTER — Ambulatory Visit: Payer: Self-pay | Admitting: Family Medicine

## 2024-11-15 ENCOUNTER — Inpatient Hospital Stay

## 2024-11-23 ENCOUNTER — Inpatient Hospital Stay: Admitting: Internal Medicine
